# Patient Record
Sex: Female | Born: 1955 | Race: White | Hispanic: No | Smoking: Former smoker
Health system: Southern US, Community
[De-identification: ages and names within clinical notes are randomized; demographics above are authoritative.]

## PROBLEM LIST (undated history)

## (undated) DIAGNOSIS — I10 Essential (primary) hypertension: Secondary | ICD-10-CM

## (undated) DIAGNOSIS — R030 Elevated blood-pressure reading, without diagnosis of hypertension: Secondary | ICD-10-CM

## (undated) DIAGNOSIS — Z5111 Encounter for antineoplastic chemotherapy: Secondary | ICD-10-CM

## (undated) DIAGNOSIS — IMO0001 Reserved for inherently not codable concepts without codable children: Secondary | ICD-10-CM

## (undated) DIAGNOSIS — J189 Pneumonia, unspecified organism: Secondary | ICD-10-CM

## (undated) DIAGNOSIS — Z853 Personal history of malignant neoplasm of breast: Secondary | ICD-10-CM

## (undated) DIAGNOSIS — T451X5A Adverse effect of antineoplastic and immunosuppressive drugs, initial encounter: Principal | ICD-10-CM

## (undated) DIAGNOSIS — J449 Chronic obstructive pulmonary disease, unspecified: Secondary | ICD-10-CM

## (undated) DIAGNOSIS — C3490 Malignant neoplasm of unspecified part of unspecified bronchus or lung: Secondary | ICD-10-CM

## (undated) DIAGNOSIS — R918 Other nonspecific abnormal finding of lung field: Secondary | ICD-10-CM

## (undated) DIAGNOSIS — C50911 Malignant neoplasm of unspecified site of right female breast: Secondary | ICD-10-CM

## (undated) DIAGNOSIS — R7303 Prediabetes: Secondary | ICD-10-CM

## (undated) DIAGNOSIS — Z923 Personal history of irradiation: Secondary | ICD-10-CM

## (undated) DIAGNOSIS — D701 Agranulocytosis secondary to cancer chemotherapy: Principal | ICD-10-CM

## (undated) DIAGNOSIS — R490 Dysphonia: Secondary | ICD-10-CM

## (undated) DIAGNOSIS — Z780 Asymptomatic menopausal state: Secondary | ICD-10-CM

## (undated) DIAGNOSIS — E559 Vitamin D deficiency, unspecified: Secondary | ICD-10-CM

## (undated) DIAGNOSIS — E876 Hypokalemia: Secondary | ICD-10-CM

## (undated) HISTORY — DX: Malignant neoplasm of unspecified part of unspecified bronchus or lung: C34.90

## (undated) HISTORY — DX: Chronic obstructive pulmonary disease, unspecified: J44.9

## (undated) HISTORY — DX: Elevated blood-pressure reading, without diagnosis of hypertension: R03.0

## (undated) HISTORY — DX: Personal history of irradiation: Z92.3

## (undated) HISTORY — DX: Encounter for antineoplastic chemotherapy: Z51.11

## (undated) HISTORY — DX: Reserved for inherently not codable concepts without codable children: IMO0001

## (undated) HISTORY — DX: Personal history of malignant neoplasm of breast: Z85.3

## (undated) HISTORY — DX: Hypokalemia: E87.6

## (undated) HISTORY — DX: Asymptomatic menopausal state: Z78.0

## (undated) HISTORY — DX: Agranulocytosis secondary to cancer chemotherapy: D70.1

## (undated) HISTORY — DX: Adverse effect of antineoplastic and immunosuppressive drugs, initial encounter: T45.1X5A

---

## 1998-04-26 HISTORY — PX: VAGINAL HYSTERECTOMY: SUR661

## 1998-04-26 HISTORY — PX: UTERINE FIBROID SURGERY: SHX826

## 1998-04-26 HISTORY — PX: VESICOVAGINAL FISTULA CLOSURE W/ TAH: SUR271

## 2007-04-27 DIAGNOSIS — C50911 Malignant neoplasm of unspecified site of right female breast: Secondary | ICD-10-CM

## 2007-04-27 HISTORY — PX: BREAST LUMPECTOMY: SHX2

## 2007-04-27 HISTORY — DX: Malignant neoplasm of unspecified site of right female breast: C50.911

## 2007-04-27 HISTORY — PX: BREAST BIOPSY: SHX20

## 2007-08-07 ENCOUNTER — Encounter (INDEPENDENT_AMBULATORY_CARE_PROVIDER_SITE_OTHER): Payer: Self-pay | Admitting: Family Medicine

## 2007-08-07 ENCOUNTER — Ambulatory Visit: Payer: Self-pay | Admitting: Family Medicine

## 2007-08-07 LAB — CONVERTED CEMR LAB
Albumin: 4.3 g/dL (ref 3.5–5.2)
CO2: 25 meq/L (ref 19–32)
Calcium: 9.7 mg/dL (ref 8.4–10.5)
Chloride: 104 meq/L (ref 96–112)
Cholesterol: 223 mg/dL — ABNORMAL HIGH (ref 0–200)
Eosinophils Relative: 3 % (ref 0–5)
Glucose, Bld: 79 mg/dL (ref 70–99)
HCT: 42.3 % (ref 36.0–46.0)
Hemoglobin: 14.6 g/dL (ref 12.0–15.0)
Lymphocytes Relative: 25 % (ref 12–46)
Lymphs Abs: 2.7 10*3/uL (ref 0.7–4.0)
Neutro Abs: 7 10*3/uL (ref 1.7–7.7)
Platelets: 336 10*3/uL (ref 150–400)
Sodium: 141 meq/L (ref 135–145)
Total Bilirubin: 0.3 mg/dL (ref 0.3–1.2)
Total Protein: 7.2 g/dL (ref 6.0–8.3)
Triglycerides: 175 mg/dL — ABNORMAL HIGH (ref <150)
VLDL: 35 mg/dL (ref 0–40)
WBC: 11.1 10*3/uL — ABNORMAL HIGH (ref 4.0–10.5)

## 2007-08-16 ENCOUNTER — Ambulatory Visit (HOSPITAL_COMMUNITY): Admission: RE | Admit: 2007-08-16 | Discharge: 2007-08-16 | Payer: Self-pay | Admitting: Family Medicine

## 2007-08-28 ENCOUNTER — Encounter: Admission: RE | Admit: 2007-08-28 | Discharge: 2007-08-28 | Payer: Self-pay | Admitting: Family Medicine

## 2007-09-20 ENCOUNTER — Encounter: Admission: RE | Admit: 2007-09-20 | Discharge: 2007-09-20 | Payer: Self-pay | Admitting: Internal Medicine

## 2007-09-20 ENCOUNTER — Encounter (INDEPENDENT_AMBULATORY_CARE_PROVIDER_SITE_OTHER): Payer: Self-pay | Admitting: Diagnostic Radiology

## 2007-09-29 ENCOUNTER — Encounter: Admission: RE | Admit: 2007-09-29 | Discharge: 2007-09-29 | Payer: Self-pay | Admitting: Internal Medicine

## 2007-10-10 ENCOUNTER — Ambulatory Visit (HOSPITAL_COMMUNITY): Admission: RE | Admit: 2007-10-10 | Discharge: 2007-10-10 | Payer: Self-pay | Admitting: General Surgery

## 2007-10-10 ENCOUNTER — Encounter (INDEPENDENT_AMBULATORY_CARE_PROVIDER_SITE_OTHER): Payer: Self-pay | Admitting: General Surgery

## 2007-10-23 ENCOUNTER — Ambulatory Visit: Payer: Self-pay | Admitting: Oncology

## 2007-11-08 LAB — CBC WITH DIFFERENTIAL/PLATELET
BASO%: 0.3 % (ref 0.0–2.0)
EOS%: 3 % (ref 0.0–7.0)
HCT: 41.2 % (ref 34.8–46.6)
LYMPH%: 21 % (ref 14.0–48.0)
MCH: 31.9 pg (ref 26.0–34.0)
MCHC: 34.9 g/dL (ref 32.0–36.0)
MCV: 91.3 fL (ref 81.0–101.0)
MONO%: 8 % (ref 0.0–13.0)
NEUT%: 67.7 % (ref 39.6–76.8)
Platelets: 337 10*3/uL (ref 145–400)
lymph#: 2.1 10*3/uL (ref 0.9–3.3)

## 2007-11-09 LAB — COMPREHENSIVE METABOLIC PANEL
ALT: 21 U/L (ref 0–35)
AST: 14 U/L (ref 0–37)
Alkaline Phosphatase: 82 U/L (ref 39–117)
BUN: 10 mg/dL (ref 6–23)
Creatinine, Ser: 0.7 mg/dL (ref 0.40–1.20)
Total Bilirubin: 0.4 mg/dL (ref 0.3–1.2)

## 2007-11-10 ENCOUNTER — Ambulatory Visit: Payer: Self-pay | Admitting: Internal Medicine

## 2007-11-13 ENCOUNTER — Encounter: Admission: RE | Admit: 2007-11-13 | Discharge: 2007-11-13 | Payer: Self-pay | Admitting: Oncology

## 2007-11-14 ENCOUNTER — Ambulatory Visit (HOSPITAL_COMMUNITY): Admission: RE | Admit: 2007-11-14 | Discharge: 2007-11-14 | Payer: Self-pay | Admitting: Oncology

## 2007-11-18 ENCOUNTER — Ambulatory Visit (HOSPITAL_COMMUNITY): Admission: RE | Admit: 2007-11-18 | Discharge: 2007-11-18 | Payer: Self-pay | Admitting: Oncology

## 2007-12-08 ENCOUNTER — Ambulatory Visit: Payer: Self-pay | Admitting: Oncology

## 2007-12-18 LAB — CBC WITH DIFFERENTIAL/PLATELET
BASO%: 1.8 % (ref 0.0–2.0)
Basophils Absolute: 0.4 10*3/uL — ABNORMAL HIGH (ref 0.0–0.1)
HCT: 42.2 % (ref 34.8–46.6)
LYMPH%: 19.5 % (ref 14.0–48.0)
MCHC: 33.6 g/dL (ref 32.0–36.0)
MONO#: 0.7 10*3/uL (ref 0.1–0.9)
NEUT%: 74.6 % (ref 39.6–76.8)
Platelets: 302 10*3/uL (ref 145–400)
WBC: 19.7 10*3/uL — ABNORMAL HIGH (ref 3.9–10.0)

## 2008-01-02 LAB — COMPREHENSIVE METABOLIC PANEL
ALT: 26 U/L (ref 0–35)
AST: 15 U/L (ref 0–37)
BUN: 16 mg/dL (ref 6–23)
CO2: 23 mEq/L (ref 19–32)
Calcium: 10.1 mg/dL (ref 8.4–10.5)
Creatinine, Ser: 0.64 mg/dL (ref 0.40–1.20)
Total Bilirubin: 0.3 mg/dL (ref 0.3–1.2)

## 2008-01-02 LAB — CBC WITH DIFFERENTIAL/PLATELET
BASO%: 0.2 % (ref 0.0–2.0)
Basophils Absolute: 0 10*3/uL (ref 0.0–0.1)
HCT: 36.6 % (ref 34.8–46.6)
HGB: 12.9 g/dL (ref 11.6–15.9)
LYMPH%: 6.4 % — ABNORMAL LOW (ref 14.0–48.0)
MCHC: 35.1 g/dL (ref 32.0–36.0)
MONO#: 0.5 10*3/uL (ref 0.1–0.9)
NEUT%: 90.7 % — ABNORMAL HIGH (ref 39.6–76.8)
Platelets: 479 10*3/uL — ABNORMAL HIGH (ref 145–400)
WBC: 19.8 10*3/uL — ABNORMAL HIGH (ref 3.9–10.0)
lymph#: 1.3 10*3/uL (ref 0.9–3.3)

## 2008-01-09 LAB — CBC WITH DIFFERENTIAL/PLATELET
BASO%: 0.4 % (ref 0.0–2.0)
Basophils Absolute: 0.1 10*3/uL (ref 0.0–0.1)
EOS%: 0.8 % (ref 0.0–7.0)
HCT: 37 % (ref 34.8–46.6)
HGB: 12.9 g/dL (ref 11.6–15.9)
LYMPH%: 16.5 % (ref 14.0–48.0)
MCH: 32.9 pg (ref 26.0–34.0)
MCHC: 34.8 g/dL (ref 32.0–36.0)
MCV: 94.6 fL (ref 81.0–101.0)
MONO%: 4.8 % (ref 0.0–13.0)
NEUT%: 77.5 % — ABNORMAL HIGH (ref 39.6–76.8)
Platelets: 361 10*3/uL (ref 145–400)

## 2008-01-19 ENCOUNTER — Ambulatory Visit: Payer: Self-pay | Admitting: Oncology

## 2008-01-22 LAB — CBC WITH DIFFERENTIAL/PLATELET
Eosinophils Absolute: 0 10*3/uL (ref 0.0–0.5)
HCT: 37.4 % (ref 34.8–46.6)
LYMPH%: 6.4 % — ABNORMAL LOW (ref 14.0–48.0)
MONO#: 0.8 10*3/uL (ref 0.1–0.9)
NEUT#: 23.3 10*3/uL — ABNORMAL HIGH (ref 1.5–6.5)
NEUT%: 90.5 % — ABNORMAL HIGH (ref 39.6–76.8)
Platelets: 487 10*3/uL — ABNORMAL HIGH (ref 145–400)
WBC: 25.7 10*3/uL — ABNORMAL HIGH (ref 3.9–10.0)

## 2008-01-22 LAB — COMPREHENSIVE METABOLIC PANEL
BUN: 11 mg/dL (ref 6–23)
CO2: 23 mEq/L (ref 19–32)
Creatinine, Ser: 0.65 mg/dL (ref 0.40–1.20)
Glucose, Bld: 149 mg/dL — ABNORMAL HIGH (ref 70–99)
Total Bilirubin: 0.3 mg/dL (ref 0.3–1.2)

## 2008-01-30 LAB — CBC WITH DIFFERENTIAL/PLATELET
Basophils Absolute: 0 10*3/uL (ref 0.0–0.1)
EOS%: 0.2 % (ref 0.0–7.0)
Eosinophils Absolute: 0.1 10*3/uL (ref 0.0–0.5)
HGB: 12.5 g/dL (ref 11.6–15.9)
MCH: 32.4 pg (ref 26.0–34.0)
MCV: 96.5 fL (ref 81.0–101.0)
MONO%: 4.9 % (ref 0.0–13.0)
NEUT#: 32.4 10*3/uL — ABNORMAL HIGH (ref 1.5–6.5)
RBC: 3.86 10*6/uL (ref 3.70–5.32)
RDW: 15.9 % — ABNORMAL HIGH (ref 11.3–14.5)
lymph#: 3.2 10*3/uL (ref 0.9–3.3)

## 2008-02-12 LAB — CBC WITH DIFFERENTIAL/PLATELET
Basophils Absolute: 0.1 10*3/uL (ref 0.0–0.1)
Eosinophils Absolute: 0 10*3/uL (ref 0.0–0.5)
HCT: 34.3 % — ABNORMAL LOW (ref 34.8–46.6)
HGB: 12 g/dL (ref 11.6–15.9)
LYMPH%: 3.5 % — ABNORMAL LOW (ref 14.0–48.0)
MONO#: 1.1 10*3/uL — ABNORMAL HIGH (ref 0.1–0.9)
NEUT#: 21.2 10*3/uL — ABNORMAL HIGH (ref 1.5–6.5)
NEUT%: 91.5 % — ABNORMAL HIGH (ref 39.6–76.8)
Platelets: 492 10*3/uL — ABNORMAL HIGH (ref 145–400)
WBC: 23.2 10*3/uL — ABNORMAL HIGH (ref 3.9–10.0)
lymph#: 0.8 10*3/uL — ABNORMAL LOW (ref 0.9–3.3)

## 2008-02-20 ENCOUNTER — Ambulatory Visit: Admission: RE | Admit: 2008-02-20 | Discharge: 2008-05-19 | Payer: Self-pay | Admitting: Radiation Oncology

## 2008-02-21 LAB — CBC WITH DIFFERENTIAL/PLATELET
BASO%: 2.1 % — ABNORMAL HIGH (ref 0.0–2.0)
Basophils Absolute: 0.6 10*3/uL — ABNORMAL HIGH (ref 0.0–0.1)
EOS%: 0.3 % (ref 0.0–7.0)
HCT: 36.4 % (ref 34.8–46.6)
HGB: 12.4 g/dL (ref 11.6–15.9)
LYMPH%: 10.5 % — ABNORMAL LOW (ref 14.0–48.0)
MCH: 32.8 pg (ref 26.0–34.0)
MCHC: 34 g/dL (ref 32.0–36.0)
NEUT%: 83.6 % — ABNORMAL HIGH (ref 39.6–76.8)
Platelets: 487 10*3/uL — ABNORMAL HIGH (ref 145–400)

## 2008-03-19 ENCOUNTER — Ambulatory Visit: Payer: Self-pay | Admitting: Oncology

## 2008-04-22 LAB — COMPREHENSIVE METABOLIC PANEL
AST: 14 U/L (ref 0–37)
Albumin: 4.1 g/dL (ref 3.5–5.2)
Alkaline Phosphatase: 72 U/L (ref 39–117)
BUN: 13 mg/dL (ref 6–23)
Creatinine, Ser: 0.71 mg/dL (ref 0.40–1.20)
Glucose, Bld: 129 mg/dL — ABNORMAL HIGH (ref 70–99)

## 2008-04-22 LAB — CBC WITH DIFFERENTIAL/PLATELET
Basophils Absolute: 0 10*3/uL (ref 0.0–0.1)
EOS%: 1.8 % (ref 0.0–7.0)
Eosinophils Absolute: 0.2 10*3/uL (ref 0.0–0.5)
HCT: 42 % (ref 34.8–46.6)
HGB: 14.5 g/dL (ref 11.6–15.9)
LYMPH%: 15.8 % (ref 14.0–48.0)
MCH: 32.7 pg (ref 26.0–34.0)
MCV: 94.5 fL (ref 81.0–101.0)
MONO%: 5 % (ref 0.0–13.0)
NEUT#: 6.5 10*3/uL (ref 1.5–6.5)
NEUT%: 77.3 % — ABNORMAL HIGH (ref 39.6–76.8)
Platelets: 323 10*3/uL (ref 145–400)

## 2008-04-22 LAB — LUTEINIZING HORMONE: LH: 32.2 m[IU]/mL

## 2008-05-07 LAB — ESTRADIOL, ULTRA SENS

## 2008-07-30 ENCOUNTER — Ambulatory Visit: Payer: Self-pay | Admitting: Genetic Counselor

## 2008-08-09 ENCOUNTER — Ambulatory Visit: Payer: Self-pay | Admitting: Oncology

## 2008-08-09 LAB — CBC WITH DIFFERENTIAL/PLATELET
BASO%: 0.5 % (ref 0.0–2.0)
EOS%: 2.6 % (ref 0.0–7.0)
HCT: 37.9 % (ref 34.8–46.6)
LYMPH%: 23.9 % (ref 14.0–49.7)
MCH: 32.4 pg (ref 25.1–34.0)
MCHC: 34.9 g/dL (ref 31.5–36.0)
NEUT%: 65.2 % (ref 38.4–76.8)
Platelets: 321 10*3/uL (ref 145–400)
RBC: 4.09 10*6/uL (ref 3.70–5.45)

## 2008-08-12 LAB — VITAMIN D 25 HYDROXY (VIT D DEFICIENCY, FRACTURES): Vit D, 25-Hydroxy: 20 ng/mL — ABNORMAL LOW (ref 30–89)

## 2008-08-12 LAB — COMPREHENSIVE METABOLIC PANEL
Albumin: 3.8 g/dL (ref 3.5–5.2)
Alkaline Phosphatase: 77 U/L (ref 39–117)
BUN: 13 mg/dL (ref 6–23)
Calcium: 9.5 mg/dL (ref 8.4–10.5)
Creatinine, Ser: 0.67 mg/dL (ref 0.40–1.20)
Glucose, Bld: 98 mg/dL (ref 70–99)
Potassium: 4.1 mEq/L (ref 3.5–5.3)

## 2008-08-12 LAB — FOLLICLE STIMULATING HORMONE: FSH: 63.5 m[IU]/mL

## 2008-08-18 LAB — ESTRADIOL, ULTRA SENS

## 2008-09-02 ENCOUNTER — Ambulatory Visit: Payer: Self-pay | Admitting: Family Medicine

## 2008-09-13 ENCOUNTER — Ambulatory Visit: Payer: Self-pay | Admitting: *Deleted

## 2008-09-13 ENCOUNTER — Ambulatory Visit: Payer: Self-pay | Admitting: Internal Medicine

## 2009-01-14 ENCOUNTER — Encounter (INDEPENDENT_AMBULATORY_CARE_PROVIDER_SITE_OTHER): Payer: Self-pay | Admitting: Adult Health

## 2009-01-14 ENCOUNTER — Ambulatory Visit: Payer: Self-pay | Admitting: Internal Medicine

## 2009-01-14 LAB — CONVERTED CEMR LAB
Albumin: 4.4 g/dL (ref 3.5–5.2)
Alkaline Phosphatase: 83 units/L (ref 39–117)
BUN: 12 mg/dL (ref 6–23)
Calcium: 9.1 mg/dL (ref 8.4–10.5)
Chloride: 106 meq/L (ref 96–112)
Eosinophils Relative: 3 % (ref 0–5)
Glucose, Bld: 92 mg/dL (ref 70–99)
HCT: 42.1 % (ref 36.0–46.0)
HDL: 51 mg/dL (ref >39)
Hemoglobin: 14.7 g/dL (ref 12.0–15.0)
LDL Cholesterol: 146 mg/dL — ABNORMAL HIGH (ref 0–99)
Lymphocytes Relative: 22 % (ref 12–46)
Lymphs Abs: 2 10*3/uL (ref 0.7–4.0)
Monocytes Absolute: 0.8 10*3/uL (ref 0.1–1.0)
Monocytes Relative: 8 % (ref 3–12)
Neutro Abs: 6.3 10*3/uL (ref 1.7–7.7)
Potassium: 4.2 meq/L (ref 3.5–5.3)
RBC: 4.5 M/uL (ref 3.87–5.11)
Sodium: 139 meq/L (ref 135–145)
Total Protein: 6.9 g/dL (ref 6.0–8.3)
Triglycerides: 201 mg/dL — ABNORMAL HIGH (ref <150)
Vit D, 25-Hydroxy: 24 ng/mL — ABNORMAL LOW (ref 30–89)
WBC: 9.3 10*3/uL (ref 4.0–10.5)

## 2009-01-20 ENCOUNTER — Encounter: Admission: RE | Admit: 2009-01-20 | Discharge: 2009-01-20 | Payer: Self-pay | Admitting: Internal Medicine

## 2009-06-06 ENCOUNTER — Ambulatory Visit: Payer: Self-pay | Admitting: Internal Medicine

## 2009-07-30 ENCOUNTER — Ambulatory Visit (HOSPITAL_COMMUNITY): Admission: RE | Admit: 2009-07-30 | Discharge: 2009-07-30 | Payer: Self-pay | Admitting: Gastroenterology

## 2009-10-01 ENCOUNTER — Ambulatory Visit: Payer: Self-pay | Admitting: Internal Medicine

## 2009-10-17 ENCOUNTER — Ambulatory Visit: Payer: Self-pay | Admitting: Internal Medicine

## 2009-10-22 ENCOUNTER — Ambulatory Visit: Payer: Self-pay | Admitting: Oncology

## 2009-12-16 ENCOUNTER — Ambulatory Visit: Payer: Self-pay | Admitting: Oncology

## 2009-12-18 LAB — COMPREHENSIVE METABOLIC PANEL
ALT: 30 U/L (ref 0–35)
Albumin: 4.5 g/dL (ref 3.5–5.2)
CO2: 21 mEq/L (ref 19–32)
Potassium: 4.1 mEq/L (ref 3.5–5.3)
Sodium: 138 mEq/L (ref 135–145)
Total Bilirubin: 0.4 mg/dL (ref 0.3–1.2)
Total Protein: 7 g/dL (ref 6.0–8.3)

## 2009-12-18 LAB — CBC WITH DIFFERENTIAL/PLATELET
BASO%: 0.5 % (ref 0.0–2.0)
LYMPH%: 28.1 % (ref 14.0–49.7)
MCHC: 34.2 g/dL (ref 31.5–36.0)
MONO#: 0.4 10*3/uL (ref 0.1–0.9)
NEUT#: 4.9 10*3/uL (ref 1.5–6.5)
Platelets: 344 10*3/uL (ref 145–400)
RBC: 4.7 10*6/uL (ref 3.70–5.45)
RDW: 13.7 % (ref 11.2–14.5)
WBC: 7.8 10*3/uL (ref 3.9–10.3)
lymph#: 2.2 10*3/uL (ref 0.9–3.3)

## 2009-12-18 LAB — LACTATE DEHYDROGENASE: LDH: 161 U/L (ref 94–250)

## 2009-12-18 LAB — VITAMIN D 25 HYDROXY (VIT D DEFICIENCY, FRACTURES): Vit D, 25-Hydroxy: 31 ng/mL (ref 30–89)

## 2009-12-22 ENCOUNTER — Ambulatory Visit: Payer: Self-pay | Admitting: Internal Medicine

## 2010-01-01 ENCOUNTER — Encounter: Admission: RE | Admit: 2010-01-01 | Discharge: 2010-01-01 | Payer: Self-pay | Admitting: Oncology

## 2010-06-04 ENCOUNTER — Other Ambulatory Visit: Payer: Self-pay | Admitting: Oncology

## 2010-06-04 ENCOUNTER — Encounter (HOSPITAL_BASED_OUTPATIENT_CLINIC_OR_DEPARTMENT_OTHER): Payer: Self-pay | Admitting: Oncology

## 2010-06-04 DIAGNOSIS — C50419 Malignant neoplasm of upper-outer quadrant of unspecified female breast: Secondary | ICD-10-CM

## 2010-06-04 DIAGNOSIS — Z170 Estrogen receptor positive status [ER+]: Secondary | ICD-10-CM

## 2010-06-04 LAB — CBC WITH DIFFERENTIAL/PLATELET
Basophils Absolute: 0 10*3/uL (ref 0.0–0.1)
HCT: 44.3 % (ref 34.8–46.6)
HGB: 14.9 g/dL (ref 11.6–15.9)
LYMPH%: 18.7 % (ref 14.0–49.7)
MCHC: 33.7 g/dL (ref 31.5–36.0)
MONO#: 0.5 10*3/uL (ref 0.1–0.9)
NEUT%: 74 % (ref 38.4–76.8)
Platelets: 304 10*3/uL (ref 145–400)
WBC: 10.5 10*3/uL — ABNORMAL HIGH (ref 3.9–10.3)
lymph#: 2 10*3/uL (ref 0.9–3.3)

## 2010-06-05 LAB — COMPREHENSIVE METABOLIC PANEL
BUN: 10 mg/dL (ref 6–23)
CO2: 25 mEq/L (ref 19–32)
Calcium: 9.2 mg/dL (ref 8.4–10.5)
Chloride: 102 mEq/L (ref 96–112)
Creatinine, Ser: 0.69 mg/dL (ref 0.40–1.20)
Glucose, Bld: 84 mg/dL (ref 70–99)
Total Bilirubin: 0.4 mg/dL (ref 0.3–1.2)

## 2010-06-05 LAB — VITAMIN D 25 HYDROXY (VIT D DEFICIENCY, FRACTURES): Vit D, 25-Hydroxy: 27 ng/mL — ABNORMAL LOW (ref 30–89)

## 2010-06-05 LAB — CANCER ANTIGEN 27.29: CA 27.29: 10 U/mL (ref 0–39)

## 2010-06-05 LAB — LACTATE DEHYDROGENASE: LDH: 147 U/L (ref 94–250)

## 2010-06-09 ENCOUNTER — Encounter: Payer: Self-pay | Admitting: Oncology

## 2010-06-09 ENCOUNTER — Other Ambulatory Visit: Payer: Self-pay | Admitting: Oncology

## 2010-06-09 DIAGNOSIS — Z853 Personal history of malignant neoplasm of breast: Secondary | ICD-10-CM

## 2010-09-08 NOTE — Op Note (Signed)
NAME:  Sharon [name], Sharon [name]                  ACCOUNT NO.:  [id number]   MEDICAL RECORD NO.:  [id number]          PATIENT TYPE:  AMB   LOCATION:  SDS                          FACILITY:  [facility]   PHYSICIAN:  Ollen Gross. Vernell Morgans, M.D. DATE OF BIRTH:  11-20-1955   DATE OF PROCEDURE:  10/10/2007  DATE OF DISCHARGE:  10/10/2007                               OPERATIVE REPORT   PREOPERATIVE DIAGNOSIS:  Right breast cancer.   POSTOPERATIVE DIAGNOSIS:  Right breast cancer.   PROCEDURE:  Right lumpectomy and sentinel node biopsy with injection of  blue dye.   SURGEON:  Ollen Gross. Vernell Morgans, MD   ANESTHESIA:  General via LMA.   PROCEDURE IN DETAIL:  After informed consent was obtained, the patient  was brought to the operating room and placed in supine position on the  operating table.  After adequate induction of general anesthesia, the  patient's right breast, axilla, and chest wall were prepped with  Betadine and draped in usual sterile manner.  The patient's palpable  mass was in the upper outer quadrant near the axilla.  It was palpable  and did have some skin retraction associated with it.  Methylene blue 2  mL and 3 mL of injectable saline were then injected in the subareolar  position.  The breast was massaged for several minutes.  Earlier in the  day, the patient had undergone injection of 1 mCi of technetium sulfur  colloid also in the subareolar position.  A NeoProbe was then used to  identify a hot spot in the right axilla.  This was very near to the mass  itself.  Therefore, an elliptical incision was made around the area of  the cancer where the skin was retracted and this incision was extended  laterally into the axilla overlying the increased radioactivity area.  The lateral portion of the incision was then carried down through the  skin and subcutaneous tissue sharply with electrocautery until we were  into the axilla.  A Weitlaner retractor was deployed.  Blunt dissection  was then carried  out in this axilla until a hot blue lymph node was  identified.  Ex vivo counts on this were about 400.  It was excised by a  combination of sharp Bovie dissection and then the lymphatics were  clamped with hemostats, divided, and ligated with 3-0 Vicryl ties.  This  was sent as sentinel node #1.  Higher in the axilla, there was another  spot of increased radioactivity which was identified with the NeoProbe.  This area was also excised sharply with electrocautery and the  lymphatics were clamped with hemostats, divided, and ligated with 3-0  Vicryl ties.  This was sent as sentinel node #2, it was hot but not  blue.  Attention was then turned to the mass itself.  The mass was  palpable and the mass was widely excised sharply with electrocautery  down to the chest wall.  This was done in a manner to maximize our  margins and try to stay away from the palpable mass.  Once this specimen  was  completely removed, it was oriented with the pink margin kit  according to the key and the key was sent with the specimen to the  pathologist.  Hemostasis was achieved using the Bovie electrocautery.  The wound was irrigated with copious amounts of saline.  The wound was  infiltrated with 0.25% Marcaine.  The deep layer of the wound was then  closed with interrupted 3-0 Vicryl stitches and the skin was closed with  running 4-0 Monocryl subcuticular  stitch.  Dermabond dressing was applied.  The patient tolerated the  procedure well.  At the end the case, all needle, sponge, and instrument  counts were correct.  The patient was then awakened and taken to  recovery in stable condition.      Ollen Gross. Vernell Morgans, M.D.  Electronically Signed     PST/MEDQ  D:  10/10/2007  T:  10/11/2007  Job:  [id number]

## 2011-01-21 LAB — COMPREHENSIVE METABOLIC PANEL
Alkaline Phosphatase: 83
BUN: 11
CO2: 28
Chloride: 103
Creatinine, Ser: 0.67
GFR calc non Af Amer: >60
Total Bilirubin: 0.5

## 2011-01-21 LAB — DIFFERENTIAL
Basophils Absolute: 0.1
Lymphocytes Relative: 22
Lymphs Abs: 2.5
Monocytes Absolute: 0.8
Monocytes Relative: 7
Neutro Abs: 7.4

## 2011-01-21 LAB — CBC
HCT: 39.8
Hemoglobin: 13.7
MCV: 94.1
RBC: 4.24
WBC: 11.1 — ABNORMAL HIGH

## 2011-01-22 ENCOUNTER — Ambulatory Visit
Admission: RE | Admit: 2011-01-22 | Discharge: 2011-01-22 | Disposition: A | Discharge status: Home / Self Care | Payer: No Typology Code available for payment source | Source: Ambulatory Visit | Attending: Oncology | Admitting: Oncology

## 2011-01-22 DIAGNOSIS — Z853 Personal history of malignant neoplasm of breast: Secondary | ICD-10-CM

## 2011-01-24 ENCOUNTER — Inpatient Hospital Stay (INDEPENDENT_AMBULATORY_CARE_PROVIDER_SITE_OTHER)
Admission: RE | Admit: 2011-01-24 | Discharge: 2011-01-24 | Disposition: A | Discharge status: Home / Self Care | Payer: No Typology Code available for payment source | Source: Ambulatory Visit | Attending: Emergency Medicine | Admitting: Emergency Medicine

## 2011-01-24 DIAGNOSIS — J42 Unspecified chronic bronchitis: Secondary | ICD-10-CM

## 2011-02-12 ENCOUNTER — Other Ambulatory Visit: Payer: Self-pay | Admitting: Oncology

## 2011-02-12 ENCOUNTER — Encounter (HOSPITAL_BASED_OUTPATIENT_CLINIC_OR_DEPARTMENT_OTHER): Payer: No Typology Code available for payment source | Admitting: Oncology

## 2011-02-12 DIAGNOSIS — C50419 Malignant neoplasm of upper-outer quadrant of unspecified female breast: Secondary | ICD-10-CM

## 2011-02-12 DIAGNOSIS — Z170 Estrogen receptor positive status [ER+]: Secondary | ICD-10-CM

## 2011-02-12 LAB — VITAMIN D 25 HYDROXY (VIT D DEFICIENCY, FRACTURES): Vit D, 25-Hydroxy: 28 ng/mL — ABNORMAL LOW (ref 30–89)

## 2011-02-12 LAB — COMPREHENSIVE METABOLIC PANEL
AST: 23 U/L (ref 0–37)
BUN: 14 mg/dL (ref 6–23)
CO2: 23 mEq/L (ref 19–32)
Calcium: 9.8 mg/dL (ref 8.4–10.5)
Chloride: 104 mEq/L (ref 96–112)
Creatinine, Ser: 0.69 mg/dL (ref 0.50–1.10)
Total Bilirubin: 0.4 mg/dL (ref 0.3–1.2)

## 2011-02-12 LAB — CBC WITH DIFFERENTIAL/PLATELET
Basophils Absolute: 0.1 10*3/uL (ref 0.0–0.1)
EOS%: 3.8 % (ref 0.0–7.0)
HCT: 42 % (ref 34.8–46.6)
HGB: 14.4 g/dL (ref 11.6–15.9)
LYMPH%: 21.2 % (ref 14.0–49.7)
MCH: 32.5 pg (ref 25.1–34.0)
NEUT%: 66.3 % (ref 38.4–76.8)
Platelets: 318 10*3/uL (ref 145–400)
lymph#: 2.2 10*3/uL (ref 0.9–3.3)

## 2011-02-12 LAB — LACTATE DEHYDROGENASE: LDH: 157 U/L (ref 94–250)

## 2011-03-31 ENCOUNTER — Telehealth: Payer: Self-pay | Admitting: *Deleted

## 2011-03-31 NOTE — Telephone Encounter (Signed)
patient confirmed over the phone the new date and time of the new appointment in 07-2011

## 2011-05-04 ENCOUNTER — Other Ambulatory Visit (HOSPITAL_COMMUNITY): Payer: Self-pay | Admitting: Family Medicine

## 2011-05-04 DIAGNOSIS — J449 Chronic obstructive pulmonary disease, unspecified: Secondary | ICD-10-CM

## 2011-05-11 ENCOUNTER — Encounter (HOSPITAL_COMMUNITY): Payer: No Typology Code available for payment source

## 2011-05-18 ENCOUNTER — Ambulatory Visit (HOSPITAL_COMMUNITY)
Admission: RE | Admit: 2011-05-18 | Discharge: 2011-05-18 | Disposition: A | Discharge status: Home / Self Care | Payer: Self-pay | Source: Ambulatory Visit | Attending: Family Medicine | Admitting: Family Medicine

## 2011-05-18 DIAGNOSIS — J4489 Other specified chronic obstructive pulmonary disease: Secondary | ICD-10-CM | POA: Insufficient documentation

## 2011-05-18 DIAGNOSIS — J449 Chronic obstructive pulmonary disease, unspecified: Secondary | ICD-10-CM | POA: Insufficient documentation

## 2011-06-28 ENCOUNTER — Other Ambulatory Visit: Payer: Self-pay | Admitting: Family Medicine

## 2011-07-26 ENCOUNTER — Other Ambulatory Visit: Payer: Self-pay | Admitting: *Deleted

## 2011-07-26 DIAGNOSIS — C50919 Malignant neoplasm of unspecified site of unspecified female breast: Secondary | ICD-10-CM

## 2011-07-26 MED ORDER — LETROZOLE 2.5 MG PO TABS: 2.5000 mg | ORAL_TABLET | Freq: Every day | ORAL | Status: DC

## 2011-07-27 ENCOUNTER — Other Ambulatory Visit: Payer: No Typology Code available for payment source | Admitting: Lab

## 2011-08-02 ENCOUNTER — Other Ambulatory Visit: Payer: Self-pay | Admitting: *Deleted

## 2011-08-02 ENCOUNTER — Ambulatory Visit (HOSPITAL_BASED_OUTPATIENT_CLINIC_OR_DEPARTMENT_OTHER): Payer: Self-pay | Admitting: Lab

## 2011-08-02 DIAGNOSIS — Z170 Estrogen receptor positive status [ER+]: Secondary | ICD-10-CM

## 2011-08-02 DIAGNOSIS — C50919 Malignant neoplasm of unspecified site of unspecified female breast: Secondary | ICD-10-CM

## 2011-08-02 DIAGNOSIS — C50419 Malignant neoplasm of upper-outer quadrant of unspecified female breast: Secondary | ICD-10-CM

## 2011-08-02 LAB — COMPREHENSIVE METABOLIC PANEL
ALT: 26 U/L (ref 0–35)
AST: 20 U/L (ref 0–37)
Albumin: 3.8 g/dL (ref 3.5–5.2)
Calcium: 10 mg/dL (ref 8.4–10.5)
Chloride: 99 mEq/L (ref 96–112)
Potassium: 3.8 mEq/L (ref 3.5–5.3)
Sodium: 138 mEq/L (ref 135–145)

## 2011-08-02 LAB — CBC WITH DIFFERENTIAL/PLATELET
BASO%: 1.1 % (ref 0.0–2.0)
MCHC: 33.4 g/dL (ref 31.5–36.0)
MONO#: 0.8 10*3/uL (ref 0.1–0.9)
RBC: 4.38 10*6/uL (ref 3.70–5.45)
WBC: 11.2 10*3/uL — ABNORMAL HIGH (ref 3.9–10.3)
lymph#: 2.2 10*3/uL (ref 0.9–3.3)
nRBC: 0 % (ref 0–0)

## 2011-08-03 ENCOUNTER — Other Ambulatory Visit: Payer: No Typology Code available for payment source | Admitting: Lab

## 2011-08-03 ENCOUNTER — Ambulatory Visit: Payer: No Typology Code available for payment source | Admitting: Oncology

## 2011-08-05 ENCOUNTER — Ambulatory Visit (HOSPITAL_BASED_OUTPATIENT_CLINIC_OR_DEPARTMENT_OTHER): Payer: Self-pay | Admitting: Oncology

## 2011-08-05 VITALS — BP 139/87 | HR 79 | Temp 98.6°F | Ht 65.5 in | Wt 283.3 lb

## 2011-08-05 DIAGNOSIS — E559 Vitamin D deficiency, unspecified: Secondary | ICD-10-CM

## 2011-08-05 DIAGNOSIS — M109 Gout, unspecified: Secondary | ICD-10-CM

## 2011-08-05 DIAGNOSIS — C50919 Malignant neoplasm of unspecified site of unspecified female breast: Secondary | ICD-10-CM

## 2011-08-05 NOTE — Progress Notes (Signed)
Hematology and Oncology Follow Up Visit  Sharon [name] [id number] [birth year] 56 y.o. 08/05/2011 11:08 AM Dr Meriel Flavors , [facility]  DIAGNOSIS: T1CN0 er/pr+ , breast cancer s/p lumpectomy 10/10/07, xrt on femara    PAST THERAPY: as above   Interim History:  Doing well, no concewrns, startin to work at [employer], he daughter is expecting.Mammogram and bone density 9/12- wnl  Medications: I have reviewed the patient's current medications. She is a chronic user of marijuana  Allergies: No Known Allergies  Past Medical History, Surgical history, Social history, and Family History were reviewed and updated.  Review of Systems: Constitutional:  Negative for fever, chills, night sweats, anorexia, weight loss, pain. Cardiovascular: no chest pain or dyspnea on exertion Respiratory: positive for - shortness of breath Neurological: negative Dermatological: negative ENT: negative Skin Gastrointestinal: negative Genito-Urinary: negative Hematological and Lymphatic: negative Breast: negative Musculoskeletal: negative Remaining ROS negative.  Physical Exam:  Blood pressure 139/87, pulse 79, temperature 98.6 F (37 C), temperature source Oral, height 5' 5.5" (1.664 m), weight 283 lb 4.8 oz (128.504 kg).  ECOG:    General appearance: alert, cooperative and appears stated age HEEnt: no abnormailities Chest: clear Cvs: nl heart sounds Abdomen ; nl Breasts- rt breast s/p lumpectomy, xrt, NED Abdomen-nl Ext-nl  Lab Results: Lab Results  Component Value Date   WBC 11.2* 08/02/2011   HGB 14.0 08/02/2011   HCT 42.0 08/02/2011   MCV 95.9 08/02/2011   PLT 321 08/02/2011     Chemistry      Component Value Date/Time   NA 138 08/02/2011 1556   K 3.8 08/02/2011 1556   CL 99 08/02/2011 1556   CO2 27 08/02/2011 1556   BUN 9 08/02/2011 1556   CREATININE 0.66 08/02/2011 1556      Component Value Date/Time   CALCIUM 10.0 08/02/2011 1556   ALKPHOS 92 08/02/2011 1556   AST 20 08/02/2011 1556   ALT 26 08/02/2011  1556   BILITOT 0.2* 08/02/2011 1556       Radiological Studies:  No results found.   IMPRESSIONS AND PLAN: A 56 y.o. female with    [name] is doing well, I will see he rin 6 months with f/u mammogram  Spent more than half the time coordinating care.    [name] 4/11/201311:08 AM

## 2011-08-13 ENCOUNTER — Ambulatory Visit: Payer: No Typology Code available for payment source | Admitting: Oncology

## 2011-08-17 ENCOUNTER — Encounter: Payer: Self-pay | Admitting: Internal Medicine

## 2011-08-18 ENCOUNTER — Telehealth: Payer: Self-pay | Admitting: Internal Medicine

## 2011-08-18 ENCOUNTER — Ambulatory Visit (INDEPENDENT_AMBULATORY_CARE_PROVIDER_SITE_OTHER)
Admission: RE | Admit: 2011-08-18 | Discharge: 2011-08-18 | Disposition: A | Discharge status: Home / Self Care | Payer: Self-pay | Source: Ambulatory Visit | Attending: Internal Medicine | Admitting: Internal Medicine

## 2011-08-18 ENCOUNTER — Ambulatory Visit (INDEPENDENT_AMBULATORY_CARE_PROVIDER_SITE_OTHER): Payer: Self-pay | Admitting: Internal Medicine

## 2011-08-18 ENCOUNTER — Other Ambulatory Visit: Payer: Self-pay | Admitting: *Deleted

## 2011-08-18 ENCOUNTER — Encounter: Payer: Self-pay | Admitting: Internal Medicine

## 2011-08-18 VITALS — BP 112/68 | HR 78 | Temp 98.0°F | Ht 66.5 in | Wt 234.6 lb

## 2011-08-18 DIAGNOSIS — C50919 Malignant neoplasm of unspecified site of unspecified female breast: Secondary | ICD-10-CM

## 2011-08-18 DIAGNOSIS — J4489 Other specified chronic obstructive pulmonary disease: Secondary | ICD-10-CM

## 2011-08-18 DIAGNOSIS — J449 Chronic obstructive pulmonary disease, unspecified: Secondary | ICD-10-CM

## 2011-08-18 DIAGNOSIS — F17200 Nicotine dependence, unspecified, uncomplicated: Secondary | ICD-10-CM | POA: Insufficient documentation

## 2011-08-18 MED ORDER — FLUTICASONE-SALMETEROL 250-50 MCG/DOSE IN AEPB: 1.0000 | INHALATION_SPRAY | Freq: Two times a day (BID) | RESPIRATORY_TRACT | Status: DC

## 2011-08-18 MED ORDER — LETROZOLE 2.5 MG PO TABS: 2.5000 mg | ORAL_TABLET | Freq: Every day | ORAL | Status: AC

## 2011-08-18 NOTE — Telephone Encounter (Signed)
Pt. Called and requested 90 supply of femara because of insurance issues.  Will call in to [facility].

## 2011-08-18 NOTE — Telephone Encounter (Signed)
I spoke with pt and is aware of MW recs. She will try pacing herself.

## 2011-08-18 NOTE — Progress Notes (Signed)
  Subjective:    Patient ID: Sharon [name], female    DOB: 03-08-1956   MRN: [id number]  HPI  7 yowf active smoking first aware of resp problems c/w recurrent bronchitis starting around 2009 and not completely fine in between spells on maint rx with combivent at least twice daily referred by [facility] 08/18/2011 to Pulmonary clinic.   08/18/2011 1st pulmonary eval cc persistent cough x sev months.  Most prod in am and in cold weather, minimally discolored, assoc with mod nasal congestion with some sneezing, and doe x steps x one flight.  Not much variability in doe and not much better now on combivent.  No h/o childhood asthma or allergies or unusual environmental or occupational exp  Sleeping ok without nocturnal exacerbation  of respiratory  c/o's or need for noct saba. Also denies any obvious fluctuation of symptoms with weather or environmental changes or other aggravating or alleviating factors except as outlined above    Review of Systems  Constitutional: Negative for fever, chills and unexpected weight change.  HENT: Positive for congestion and sneezing. Negative for ear pain, nosebleeds, sore throat, rhinorrhea, trouble swallowing, dental problem, voice change, postnasal drip and sinus pressure.   Eyes: Negative for visual disturbance.  Respiratory: Positive for cough and shortness of breath. Negative for choking.   Cardiovascular: Positive for leg swelling. Negative for chest pain.  Gastrointestinal: Negative for vomiting, abdominal pain and diarrhea.  Genitourinary: Negative for difficulty urinating.  Musculoskeletal: Negative for arthralgias.  Skin: Negative for rash.  Neurological: Negative for tremors, syncope and headaches.  Hematological: Does not bruise/bleed easily.       Objective:   Physical Exam Pleasant amb wf nad Wt 234 08/18/11 HEENT mild turbinate edema.  Oropharynx no thrush or excess pnd or cobblestoning.  No JVD or cervical adenopathy. Minaccessory muscle  hypertrophy. Trachea midline, nl thryroid. Chest was  mn hyperinflated by percussion with diminished breath sounds and mild ncreased exp time without wheeze. Hoover sign positive at end inspiration. Regular rate and rhythm without murmur gallop or rub or increase P2 or edema.  Abd: no hsm, nl excursion. Ext warm without cyanosis or clubbing.        CXR  08/18/2011 :  No acute cardiopulmonary abnormality.    Assessment & Plan:

## 2011-08-18 NOTE — Telephone Encounter (Signed)
Pt calling to inform MW about her job which she forgot to mention at OV today.  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 She wants to know if this is okay. Pls advise.

## 2011-08-18 NOTE — Patient Instructions (Addendum)
Advair 250 one twice daily then rinse gargle   Try to stop smoking before you stops you  Please remember to go to the   x-ray department downstairs for your tests - we will call you with the results when they are available.     Please schedule a follow up office visit in 6 weeks, call sooner if needed

## 2011-08-18 NOTE — Telephone Encounter (Signed)
Ok if paces herself but if doesn't think she can do it ok to give work note until Monday the 28th and plan on seeing [place] that week if not able to work yet cause should be feeling better

## 2011-08-19 ENCOUNTER — Telehealth: Payer: Self-pay | Admitting: Internal Medicine

## 2011-08-19 NOTE — Telephone Encounter (Signed)
lmomtcb  

## 2011-08-19 NOTE — Progress Notes (Signed)
Quick Note:  LMTCB ______ 

## 2011-08-19 NOTE — Assessment & Plan Note (Addendum)
-   PFT's 05/18/11 FEV1 1.74 (59%) ratio 62 and DLCO 92  GOLD II COPD and still smokng, dicussed separately.  rec trial of advair as prn combivent not effective  The proper method of use, as well as anticipated side effects, of this dry powder  inhaler are discussed and demonstrated to the patient.

## 2011-08-19 NOTE — Assessment & Plan Note (Signed)
I took an extended  opportunity with this patient to outline the consequences of continued cigarette use  in airway disorders based on all the data we have from the multiple national lung health studies (perfomed over decades at millions of dollars in cost)  indicating that smoking cessation, not choice of inhalers or physicians, is the most important aspect of care.    In addition, I reviewed the Flethcher curve with patient that basically indicates  if you quit smoking when your best day FEV1 is still well preserved (which hers still is) it is highly unlikely you will progress to severe disease and informed the patient there was no medication on the market that has proven to change the curve or the likelihood of progression.   She needs to commit to stop smoking therefore before smoking stops her.

## 2011-08-20 ENCOUNTER — Encounter: Payer: Self-pay | Admitting: *Deleted

## 2011-08-20 NOTE — Telephone Encounter (Signed)
Ok with me to send letter as requested.

## 2011-08-20 NOTE — Telephone Encounter (Signed)
Spoke with pt. She is requesting a letter for work stating that it is okay for her to walk, as long as she paces herself. She wants to have this mailed to her. Please advise if okay to send letter, thanks

## 2011-08-20 NOTE — Telephone Encounter (Signed)
Letter created and mailed to pt.

## 2011-08-20 NOTE — Progress Notes (Signed)
Quick Note:  Spoke with pt and notified of results per Dr. Wert. Pt verbalized understanding and denied any questions.  ______ 

## 2011-10-06 ENCOUNTER — Encounter: Payer: Self-pay | Admitting: Internal Medicine

## 2011-10-06 ENCOUNTER — Ambulatory Visit (INDEPENDENT_AMBULATORY_CARE_PROVIDER_SITE_OTHER): Payer: Self-pay | Admitting: Internal Medicine

## 2011-10-06 VITALS — BP 120/76 | HR 80 | Temp 98.2°F | Ht 66.5 in | Wt 234.0 lb

## 2011-10-06 DIAGNOSIS — F17200 Nicotine dependence, unspecified, uncomplicated: Secondary | ICD-10-CM

## 2011-10-06 DIAGNOSIS — J449 Chronic obstructive pulmonary disease, unspecified: Secondary | ICD-10-CM

## 2011-10-06 DIAGNOSIS — IMO0001 Reserved for inherently not codable concepts without codable children: Secondary | ICD-10-CM

## 2011-10-06 NOTE — Patient Instructions (Addendum)
Keep working on cutting down on smoking - it's the most important aspect of your care - remember the Primitivo Gauze rule!  If not happy with the advair I would recommend a trial of symbicort 160 2 every 12 hours    If you are satisfied with your treatment plan let your doctor know and he/she can either refill your medications or you can return here when your prescription runs out.     If in any way you are not 100% satisfied,  please tell us.  If 100% better, tell your friends!

## 2011-10-06 NOTE — Progress Notes (Signed)
  Subjective:    Patient ID: Sharon [name], female    DOB: 1956/02/01   MRN: [id number]  HPI  31 yowf active smoking first aware of resp problems c/w recurrent bronchitis starting around 2009 and not completely fine in between spells on maint rx with combivent at least twice daily referred by [facility] 08/18/2011 to Pulmonary clinic.   08/18/2011 1st pulmonary eval cc persistent cough x sev months.  Most prod in am and in cold weather, minimally discolored, assoc with mod nasal congestion with some sneezing, and doe x steps x one flight.  Not much variability in doe and not much better now on combivent.  No h/o childhood asthma or allergies or unusual environmental or occupational exp rec Advair 250 one twice daily then rinse gargle  Try to stop smoking before you stops you    10/06/2011 f/u ov/Tilla Wilborn still smoking cc doe much better on advair, rare need combivent daytime. Min cough no purulent sputum    Sleeping ok without nocturnal exacerbation  of respiratory  c/o's or need for noct combivent. Also denies any obvious fluctuation of symptoms with weather or environmental changes or other aggravating or alleviating factors except as outlined above          Objective:   Physical Exam Pleasant amb wf nad Wt 234 08/18/11 > 10/06/2011  234 HEENT mild turbinate edema.  Oropharynx no thrush or excess pnd or cobblestoning.  No JVD or cervical adenopathy. Minaccessory muscle hypertrophy. Trachea midline, nl thryroid. Chest was  mn hyperinflated by percussion with diminished breath sounds and mild ncreased exp time without wheeze. Hoover sign positive at end inspiration. Regular rate and rhythm without murmur gallop or rub or increase P2 or edema.  Abd: no hsm, nl excursion. Ext warm without cyanosis or clubbing.        CXR  08/18/2011 :  No acute cardiopulmonary abnormality.    Assessment & Plan:

## 2011-10-10 NOTE — Assessment & Plan Note (Signed)
Again reviewed  the Flethcher curve with patient that basically indicates  if you quit smoking when your best day FEV1 is still well preserved (which hers is) it is highly unlikely you will progress to severe disease and informed the patient there was no medication on the market that has proven to change the curve or the likelihood of progression.  Therefore stopping smoking and maintaining abstinence is the most important aspect of care, not choice of inhalers or for that matter, doctors.

## 2011-10-10 NOTE — Assessment & Plan Note (Signed)
-   PFT's 05/18/11 FEV1 1.74 (59%) ratio 62 and DLCO 92  GOLD II and relatively well compensated despite active smoking - discussed separately    Each maintenance medication was reviewed in detail including most importantly the difference between maintenance and as needed and under what circumstances the prns are to be used.  Please see instructions for details which were reviewed in writing and the patient given a copy.

## 2011-11-02 ENCOUNTER — Telehealth: Payer: Self-pay | Admitting: Internal Medicine

## 2011-11-02 MED ORDER — FLUTICASONE-SALMETEROL 250-50 MCG/DOSE IN AEPB: 1.0000 | INHALATION_SPRAY | Freq: Two times a day (BID) | RESPIRATORY_TRACT | Status: DC

## 2011-11-02 NOTE — Telephone Encounter (Signed)
RX was printed off and placed in Sharon [name] look at for him to sign so we can send this over to [facility]

## 2011-11-03 NOTE — Telephone Encounter (Signed)
Advair RX faxed to [facility] at [phone].

## 2011-11-24 ENCOUNTER — Other Ambulatory Visit: Payer: Self-pay | Admitting: *Deleted

## 2011-11-24 MED ORDER — LETROZOLE 2.5 MG PO TABS: 2.5000 mg | ORAL_TABLET | Freq: Every day | ORAL | Status: AC

## 2011-12-06 ENCOUNTER — Encounter: Payer: Self-pay | Admitting: Oncology

## 2012-02-24 ENCOUNTER — Other Ambulatory Visit: Payer: Self-pay | Admitting: Emergency Medicine

## 2012-02-24 MED ORDER — LETROZOLE 2.5 MG PO TABS: 2.5000 mg | ORAL_TABLET | Freq: Every day | ORAL | Status: DC

## 2012-06-19 ENCOUNTER — Telehealth: Payer: Self-pay | Admitting: *Deleted

## 2012-06-19 ENCOUNTER — Other Ambulatory Visit: Payer: Self-pay | Admitting: *Deleted

## 2012-06-19 DIAGNOSIS — C50919 Malignant neoplasm of unspecified site of unspecified female breast: Secondary | ICD-10-CM

## 2012-06-19 MED ORDER — LETROZOLE 2.5 MG PO TABS: 2.5000 mg | ORAL_TABLET | Freq: Every day | ORAL | Status: DC

## 2012-06-19 NOTE — Telephone Encounter (Signed)
FORMER DR.RUBIN PT. REFILL FOR THIS MEDICATION WAS PLACED UNDER THE ON CALL PHYSICIAN FOR TODAY WHICH IS DR.HA. NOTIFIED Sharon [name] WHO WILL CONTACT PT. WITH AN APPOINTMENT WITH PT.'S NEW PHYSICIAN

## 2012-06-19 NOTE — Telephone Encounter (Signed)
Called and spoke with patient to schedule an appt.  Confirmed appt. With [employer] 07/18/12 at 115.  Then will become Dr. Darnelle Catalan.

## 2012-07-03 ENCOUNTER — Encounter: Payer: Self-pay | Admitting: *Deleted

## 2012-07-03 ENCOUNTER — Encounter: Payer: Self-pay | Admitting: Oncology

## 2012-07-03 NOTE — Progress Notes (Signed)
Mailed letter & calendar to pt. 

## 2012-07-18 ENCOUNTER — Ambulatory Visit (HOSPITAL_BASED_OUTPATIENT_CLINIC_OR_DEPARTMENT_OTHER): Payer: PRIVATE HEALTH INSURANCE | Admitting: Lab

## 2012-07-18 ENCOUNTER — Ambulatory Visit (HOSPITAL_BASED_OUTPATIENT_CLINIC_OR_DEPARTMENT_OTHER): Payer: PRIVATE HEALTH INSURANCE | Admitting: Gynecologic Oncology

## 2012-07-18 ENCOUNTER — Other Ambulatory Visit: Payer: Self-pay | Admitting: *Deleted

## 2012-07-18 ENCOUNTER — Telehealth: Payer: Self-pay | Admitting: *Deleted

## 2012-07-18 ENCOUNTER — Encounter: Payer: Self-pay | Admitting: *Deleted

## 2012-07-18 ENCOUNTER — Encounter: Payer: Self-pay | Admitting: Gynecologic Oncology

## 2012-07-18 VITALS — BP 131/80 | HR 74 | Temp 97.8°F | Resp 20 | Ht 66.0 in | Wt 220.2 lb

## 2012-07-18 DIAGNOSIS — C50919 Malignant neoplasm of unspecified site of unspecified female breast: Secondary | ICD-10-CM | POA: Insufficient documentation

## 2012-07-18 DIAGNOSIS — C50911 Malignant neoplasm of unspecified site of right female breast: Secondary | ICD-10-CM

## 2012-07-18 DIAGNOSIS — C50419 Malignant neoplasm of upper-outer quadrant of unspecified female breast: Secondary | ICD-10-CM

## 2012-07-18 DIAGNOSIS — F17200 Nicotine dependence, unspecified, uncomplicated: Secondary | ICD-10-CM

## 2012-07-18 DIAGNOSIS — Z853 Personal history of malignant neoplasm of breast: Secondary | ICD-10-CM

## 2012-07-18 DIAGNOSIS — Z170 Estrogen receptor positive status [ER+]: Secondary | ICD-10-CM

## 2012-07-18 LAB — CBC WITH DIFFERENTIAL/PLATELET
BASO%: 0.6 % (ref 0.0–2.0)
Eosinophils Absolute: 0.4 10*3/uL (ref 0.0–0.5)
HCT: 44.3 % (ref 34.8–46.6)
MCHC: 34 g/dL (ref 31.5–36.0)
MONO#: 0.6 10*3/uL (ref 0.1–0.9)
NEUT#: 6.3 10*3/uL (ref 1.5–6.5)
Platelets: 301 10*3/uL (ref 145–400)
RBC: 4.73 10*6/uL (ref 3.70–5.45)
WBC: 9 10*3/uL (ref 3.9–10.3)
lymph#: 1.7 10*3/uL (ref 0.9–3.3)

## 2012-07-18 LAB — COMPREHENSIVE METABOLIC PANEL (CC13)
ALT: 20 U/L (ref 0–55)
Albumin: 3.6 g/dL (ref 3.5–5.0)
CO2: 27 mEq/L (ref 22–29)
Calcium: 9.7 mg/dL (ref 8.4–10.4)
Chloride: 104 mEq/L (ref 98–107)
Glucose: 99 mg/dl (ref 70–99)
Sodium: 140 mEq/L (ref 136–145)
Total Protein: 7.3 g/dL (ref 6.4–8.3)

## 2012-07-18 LAB — RESEARCH LABS

## 2012-07-18 NOTE — Patient Instructions (Addendum)
Doing well.  We will contact you with the results of your lab work from today.  Plan to follow up with Dr. Darnelle Catalan in one year or sooner if needed.  Please try to get your mammogram as soon as possible and call for any questions or concerns.  Consider smoking cessation.

## 2012-07-18 NOTE — Telephone Encounter (Signed)
appts made and printed 

## 2012-07-18 NOTE — Progress Notes (Signed)
ID: Sharon [name]   DOB: 04-22-1956  MR#: [id number]  WUJ#:[id number]  PCP: Norberto Sorenson, MD SU:  Dr. Carolynne Edouard OTHER MD: [facility]  HISTORY OF PRESENT ILLNESS: Sharon [name] is a 57 year old [place] woman, who noted a mass in the upper outer aspect of her right breast on self-examination.  She proceeded to undergo a mammogram, which showed a spiculated mass in the upper outer aspect of the right breast.  A biopsy was performed of this area on 09/20/07, which revealed invasive mammary carcinoma, ER 74%, PR 27%, Ki67 30%, HER2 0.  She had an MRI on 09/29/07, which showed a 1.6cm mass.  The patient underwent a partial mastectomy and sentinel node biopsy by Dr. Carolynne Edouard on 10/10/07.  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  There was perineural invasion and the tumor was grade 2 out of 3.  The patient proceeded to undergo four cycles of Taxotere/Cytoxan, which she completed in October of 2009.  She then underwent radiation therapy from 03/15/08 to 05/15/08 under the care of Dr. Roselind Messier.  She was started on Femara in January of 2010 and has tolerated it well since.     INTERVAL HISTORY:  She presents today for continued follow up.  She reports tolerating Femara well with occasional, tolerable hot flashes.  She reports intermittent, arthritic-like aches that resolve after movement or stretching.  She denies vaginal dryness.  She has not had a mammogram since September of 2012 due to financial issues.  She states that she will have assistance soon and that she plans to schedule her mammogram.  She attends a clinic in [facility] for [facility] care services.  She has recently been working with creating mosaic glass terra cotta pots and voices concerns about grout possibly causing cancer.  She refused information about smoking cessation and states that she "has been in the pre-contemplation stage for years."  REVIEW OF  SYSTEMS: Constitutional: Feels well.  Cardiovascular: Intermittent shortness of breath related to COPD.  No chest pain or edema.  Pulmonary: Intermittent cough related to COPD.  No wheeze.  Gastrointestinal: No nausea, vomiting, or diarrhea. Bright red blood per rectum due to hemorrhoids per patient.  Last colonoscopy in April of 2011 where she was told she had hemorrhoids and diverticulosis. No change in bowel movement.  Genitourinary: No frequency, urgency, or dysuria. No vaginal bleeding or discharge.  Musculoskeletal: No myalgia or joint pain. Neurologic: No weakness, numbness, or change in gait.  Psychology: No depression, anxiety, or insomnia.  PAST MEDICAL HISTORY: Past Medical History  Diagnosis Date  . Breast cancer   . Menopause   . COPD (chronic obstructive pulmonary disease)   . Elevated blood pressure     PAST SURGICAL HISTORY: Past Surgical History  Procedure Laterality Date  . Vesicovaginal fistula closure w/ tah    . Breast lumpectomy  2009    Rt    FAMILY HISTORY Family History  Problem Relation Age of Onset  . Adopted: Yes    GYNECOLOGIC HISTORY:  Gravida 4 with 1 miscarriage, para 3 (one daughter, two sons).  Menarche at age 31, parity at age 73.  No history of hormone replacement therapy.The patient has had a prior hysterectomy in 1999 and has one ovary remaining.  SOCIAL HISTORY: The patient is divorced.  She is adopted.  The patient has master's degree.  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  The patient has one daughter and two sons.  The patient smokes approximately one pack of cigarettes per day.      ADVANCED DIRECTIVES:  None  HEALTH MAINTENANCE: History  Substance Use Topics  . Smoking status: Current Every Day Smoker -- 1.00 packs/day for 30 years    Types: Cigarettes  . Smokeless tobacco: Never Used  . Alcohol Use: No    Colonoscopy: 07/30/2009  PAP:  06/28/2011  Bone density:  01/22/11 resulting  low bone mass  No Known Allergies  Current Outpatient Prescriptions  Medication Sig Dispense Refill  . aspirin 81 MG tablet Take 81 mg by mouth daily.      . Cholecalciferol (VITAMIN D3) 2000 UNITS TABS Take 1 tablet by mouth daily.      . Fluticasone-Salmeterol (ADVAIR DISKUS) 250-50 MCG/DOSE AEPB Inhale 1 puff into the lungs 2 (two) times daily.  60 each  3  . letrozole (FEMARA) 2.5 MG tablet Take 1 tablet (2.5 mg total) by mouth daily.  30 tablet  2   No current facility-administered medications for this visit.    OBJECTIVE: Filed Vitals:   07/18/12 1336  BP: 131/80  Pulse: 74  Temp: 97.8 F (36.6 C)  Resp: 20     Body mass index is 35.56 kg/(m^2).    ECOG FS:  Symptomatic but completely ambulatory  General: Well developed, well nourished female in no acute distress. Alert and oriented x 3.  Head/ Neck: Oropharynx clear.  Sclerae anicteric.  Supple without any enlargements.  Lymph node survey: No cervical, supraclavicular, or axillary adenopathy  Cardiovascular: Regular rate and rhythm. S1 and S2 normal.  Lungs: Clear to auscultation bilaterally. No wheezes/crackles/rhonchi noted.  Skin: No rashes or lesions present. Back: No CVA tenderness.  Abdomen: Abdomen soft, non-tender and obese. Active bowel sounds in all quadrants. No evidence of a fluid wave or abdominal masses.  Breasts: Inspection negative with no nodularity, masses, erythema, or discharge noted bilaterally. Right breast s/p lumpectomy with scar well healed. Extremities: No bilateral cyanosis, edema, or clubbing.    LAB RESULTS: Lab Results  Component Value Date   WBC 11.2* 08/02/2011   NEUTROABS 7.6* 08/02/2011   HGB 14.0 08/02/2011   HCT 42.0 08/02/2011   MCV 95.9 08/02/2011   PLT 321 08/02/2011      Chemistry      Component Value Date/Time   NA 138 08/02/2011 1556   K 3.8 08/02/2011 1556   CL 99 08/02/2011 1556   CO2 27 08/02/2011 1556   BUN 9 08/02/2011 1556   CREATININE 0.66 08/02/2011 1556      Component Value  Date/Time   CALCIUM 10.0 08/02/2011 1556   ALKPHOS 92 08/02/2011 1556   AST 20 08/02/2011 1556   ALT 26 08/02/2011 1556   BILITOT 0.2* 08/02/2011 1556       Lab Results  Component Value Date   LABCA2 14 02/12/2011    No components found with this basename: LABCA125    No results found for this basename: INR,  in the last 168 hours  Urinalysis No results found for this basename: colorurine,  appearanceur,  labspec,  phurine,  glucoseu,  hgbur,  bilirubinur,  ketonesur,  proteinur,  urobilinogen,  nitrite,  leukocytesur    STUDIES: No results found.  ASSESSMENT: 57 y.o. [place] woman: #1 S/P right lumpectomy with sentinel lymph node biopsy on 10/10/07 by Dr. Carolynne Edouard for a T1c N0 M0 IDC, grade 2, Stage I, ER 74%, PR 27%,  Ki67 30%, HER2 0.  #2 She was enrolled in the TailorX Trial and completed four cycles of Taxotere and Cytoxan in October of 2009.  #3 She underwent radiation therapy from 03/15/08 to 05/15/08.  #4 She started Femara in January 2010 and has tolerated it well since.  PLAN:  She is to schedule a mammogram as soon as possible.  We will contact her with the results of her lab work from today.  She is advised to stop smoking and increase her physical activity.  She is to see Dr. Darnelle Catalan in one year with lab work at that time or sooner if needed.  She is advised to call for any questions or concerns.  The patient was reviewed with Dr. Darnelle Catalan, who spoke with the patient about future plans and recommendations.   Naitik Hermann DEAL    07/18/2012

## 2012-07-20 ENCOUNTER — Telehealth: Payer: Self-pay | Admitting: Gynecologic Oncology

## 2012-07-20 NOTE — Telephone Encounter (Signed)
Patient notified of CBC results.  No questions voiced.  Instructed to call for any needs.

## 2012-09-19 ENCOUNTER — Other Ambulatory Visit: Payer: Self-pay | Admitting: Oncology

## 2012-09-25 ENCOUNTER — Other Ambulatory Visit: Payer: Self-pay | Admitting: Medical Oncology

## 2012-09-25 DIAGNOSIS — C50919 Malignant neoplasm of unspecified site of unspecified female breast: Secondary | ICD-10-CM

## 2012-09-25 MED ORDER — LETROZOLE 2.5 MG PO TABS: 2.5000 mg | ORAL_TABLET | Freq: Every day | ORAL | Status: DC

## 2013-05-21 ENCOUNTER — Telehealth: Payer: Self-pay | Admitting: *Deleted

## 2013-05-21 NOTE — Telephone Encounter (Signed)
The number has been disconnected or no longer in service. i will mail a letter/avs to the pt showing change in her appt...td

## 2013-07-19 ENCOUNTER — Other Ambulatory Visit: Payer: PRIVATE HEALTH INSURANCE

## 2013-07-19 ENCOUNTER — Ambulatory Visit: Payer: PRIVATE HEALTH INSURANCE | Admitting: Oncology

## 2013-07-20 ENCOUNTER — Telehealth: Payer: Self-pay | Admitting: Oncology

## 2013-08-22 ENCOUNTER — Telehealth: Payer: Self-pay | Admitting: *Deleted

## 2013-08-22 NOTE — Telephone Encounter (Signed)
Attempted to reach pt to r/s appt with Dr. Jana Hakim d/t he is in [facility].  Unable to reach pt, d/t number being busy.  Will attempt to call again.

## 2013-09-05 ENCOUNTER — Other Ambulatory Visit: Payer: PRIVATE HEALTH INSURANCE

## 2013-09-05 ENCOUNTER — Ambulatory Visit: Payer: PRIVATE HEALTH INSURANCE | Admitting: Oncology

## 2014-10-22 ENCOUNTER — Telehealth: Payer: Self-pay | Admitting: Oncology

## 2014-10-22 NOTE — Telephone Encounter (Signed)
10/22/14 - 8:57 am - Patient called after receiving my letter.  She states she is doing good.  Has moved and no longer comes here for follow-up.  Just started a new job and does not have insurance yet but will establish herself with a doctor once her insurance goes into effect.  She has not had a mammogram in 2 years.  She stopped the Femara on her on about a year ago or less.  I will contact her again next year to check on her for the study.  I thanked the patient for continuing to support this clinical trial.   Chapin Assistant

## 2014-11-11 ENCOUNTER — Encounter: Payer: Self-pay | Admitting: Genetic Counselor

## 2015-11-06 ENCOUNTER — Encounter (HOSPITAL_COMMUNITY): Payer: Self-pay | Admitting: *Deleted

## 2015-11-06 ENCOUNTER — Emergency Department (HOSPITAL_COMMUNITY): Payer: 59

## 2015-11-06 ENCOUNTER — Inpatient Hospital Stay (HOSPITAL_COMMUNITY)
Admission: EM | Admit: 2015-11-06 | Discharge: 2015-11-11 | DRG: 166 | Disposition: A | Discharge status: Home / Self Care | Payer: 59 | Attending: Internal Medicine | Admitting: Internal Medicine

## 2015-11-06 DIAGNOSIS — F17210 Nicotine dependence, cigarettes, uncomplicated: Secondary | ICD-10-CM | POA: Diagnosis present

## 2015-11-06 DIAGNOSIS — Z419 Encounter for procedure for purposes other than remedying health state, unspecified: Secondary | ICD-10-CM

## 2015-11-06 DIAGNOSIS — Z853 Personal history of malignant neoplasm of breast: Secondary | ICD-10-CM

## 2015-11-06 DIAGNOSIS — J449 Chronic obstructive pulmonary disease, unspecified: Secondary | ICD-10-CM | POA: Diagnosis present

## 2015-11-06 DIAGNOSIS — R590 Localized enlarged lymph nodes: Secondary | ICD-10-CM | POA: Insufficient documentation

## 2015-11-06 DIAGNOSIS — J9601 Acute respiratory failure with hypoxia: Secondary | ICD-10-CM | POA: Diagnosis present

## 2015-11-06 DIAGNOSIS — J189 Pneumonia, unspecified organism: Secondary | ICD-10-CM | POA: Diagnosis present

## 2015-11-06 DIAGNOSIS — J440 Chronic obstructive pulmonary disease with acute lower respiratory infection: Secondary | ICD-10-CM | POA: Diagnosis not present

## 2015-11-06 DIAGNOSIS — R05 Cough: Secondary | ICD-10-CM | POA: Diagnosis not present

## 2015-11-06 DIAGNOSIS — Z7951 Long term (current) use of inhaled steroids: Secondary | ICD-10-CM

## 2015-11-06 DIAGNOSIS — R042 Hemoptysis: Secondary | ICD-10-CM | POA: Diagnosis present

## 2015-11-06 DIAGNOSIS — R918 Other nonspecific abnormal finding of lung field: Secondary | ICD-10-CM | POA: Diagnosis not present

## 2015-11-06 HISTORY — DX: Dysphonia: R49.0

## 2015-11-06 HISTORY — DX: Pneumonia, unspecified organism: J18.9

## 2015-11-06 HISTORY — DX: Prediabetes: R73.03

## 2015-11-06 HISTORY — DX: Other nonspecific abnormal finding of lung field: R91.8

## 2015-11-06 HISTORY — DX: Malignant neoplasm of unspecified site of right female breast: C50.911

## 2015-11-06 HISTORY — DX: Vitamin D deficiency, unspecified: E55.9

## 2015-11-06 LAB — CBC
HEMATOCRIT: 43.9 % (ref 36.0–46.0)
HEMOGLOBIN: 14.5 g/dL (ref 12.0–15.0)
MCH: 31 pg (ref 26.0–34.0)
MCHC: 33 g/dL (ref 30.0–36.0)
MCV: 93.8 fL (ref 78.0–100.0)
Platelets: 347 10*3/uL (ref 150–400)
RBC: 4.68 MIL/uL (ref 3.87–5.11)
RDW: 13.4 % (ref 11.5–15.5)
WBC: 14.1 10*3/uL — ABNORMAL HIGH (ref 4.0–10.5)

## 2015-11-06 LAB — BASIC METABOLIC PANEL
ANION GAP: 7 (ref 5–15)
BUN: 10 mg/dL (ref 6–20)
CHLORIDE: 103 mmol/L (ref 101–111)
CO2: 27 mmol/L (ref 22–32)
Calcium: 9.5 mg/dL (ref 8.9–10.3)
Creatinine, Ser: 0.69 mg/dL (ref 0.44–1.00)
GFR calc non Af Amer: >60 mL/min (ref >60)
Glucose, Bld: 95 mg/dL (ref 65–99)
POTASSIUM: 4.1 mmol/L (ref 3.5–5.1)
SODIUM: 137 mmol/L (ref 135–145)

## 2015-11-06 MED ORDER — VANCOMYCIN HCL 10 G IV SOLR
2000.0000 mg | Freq: Once | INTRAVENOUS | Status: DC
  Filled 2015-11-06: qty 2000

## 2015-11-06 MED ORDER — ALBUTEROL SULFATE (2.5 MG/3ML) 0.083% IN NEBU: 2.5000 mg | INHALATION_SOLUTION | RESPIRATORY_TRACT | Status: DC | PRN

## 2015-11-06 MED ORDER — PIPERACILLIN-TAZOBACTAM 3.375 G IVPB
3.3750 g | Freq: Three times a day (TID) | INTRAVENOUS | Status: DC
  Administered 2015-11-07 – 2015-11-11 (×13): 3.375 g via INTRAVENOUS
  Filled 2015-11-06 (×18): qty 50

## 2015-11-06 MED ORDER — MOMETASONE FURO-FORMOTEROL FUM 200-5 MCG/ACT IN AERO: 2.0000 | INHALATION_SPRAY | Freq: Two times a day (BID) | RESPIRATORY_TRACT | Status: DC

## 2015-11-06 MED ORDER — PIPERACILLIN-TAZOBACTAM 3.375 G IVPB 30 MIN: 3.3750 g | Freq: Three times a day (TID) | INTRAVENOUS | Status: DC

## 2015-11-06 MED ORDER — IPRATROPIUM BROMIDE 0.02 % IN SOLN
0.5000 mg | Freq: Once | RESPIRATORY_TRACT | Status: AC
  Administered 2015-11-06: 0.5 mg via RESPIRATORY_TRACT
  Filled 2015-11-06: qty 2.5

## 2015-11-06 MED ORDER — FLUTICASONE PROPIONATE 50 MCG/ACT NA SUSP
2.0000 | Freq: Every day | NASAL | Status: DC
  Administered 2015-11-07 – 2015-11-11 (×4): 2 via NASAL
  Filled 2015-11-06: qty 16

## 2015-11-06 MED ORDER — MOMETASONE FURO-FORMOTEROL FUM 200-5 MCG/ACT IN AERO
2.0000 | INHALATION_SPRAY | Freq: Two times a day (BID) | RESPIRATORY_TRACT | Status: DC
  Administered 2015-11-07 – 2015-11-11 (×9): 2 via RESPIRATORY_TRACT
  Filled 2015-11-06: qty 8.8

## 2015-11-06 MED ORDER — ACETAMINOPHEN 650 MG RE SUPP: 650.0000 mg | Freq: Four times a day (QID) | RECTAL | Status: DC | PRN

## 2015-11-06 MED ORDER — ALBUTEROL SULFATE HFA 108 (90 BASE) MCG/ACT IN AERS: 1.0000 | INHALATION_SPRAY | Freq: Four times a day (QID) | RESPIRATORY_TRACT | Status: DC | PRN

## 2015-11-06 MED ORDER — ACETAMINOPHEN 325 MG PO TABS: 650.0000 mg | ORAL_TABLET | Freq: Four times a day (QID) | ORAL | Status: DC | PRN

## 2015-11-06 MED ORDER — ONDANSETRON HCL 4 MG/2ML IJ SOLN
4.0000 mg | Freq: Four times a day (QID) | INTRAMUSCULAR | Status: DC | PRN
  Administered 2015-11-10: 4 mg via INTRAVENOUS
  Filled 2015-11-06: qty 2

## 2015-11-06 MED ORDER — VANCOMYCIN HCL 10 G IV SOLR: 1250.0000 mg | Freq: Two times a day (BID) | INTRAVENOUS | Status: DC

## 2015-11-06 MED ORDER — MONTELUKAST SODIUM 10 MG PO TABS
10.0000 mg | ORAL_TABLET | Freq: Every day | ORAL | Status: DC
  Administered 2015-11-07 – 2015-11-10 (×5): 10 mg via ORAL
  Filled 2015-11-06 (×6): qty 1

## 2015-11-06 MED ORDER — ALBUTEROL SULFATE (2.5 MG/3ML) 0.083% IN NEBU
5.0000 mg | INHALATION_SOLUTION | Freq: Once | RESPIRATORY_TRACT | Status: AC
  Administered 2015-11-06: 5 mg via RESPIRATORY_TRACT
  Filled 2015-11-06: qty 6

## 2015-11-06 MED ORDER — VANCOMYCIN HCL 10 G IV SOLR
1250.0000 mg | Freq: Two times a day (BID) | INTRAVENOUS | Status: DC
  Administered 2015-11-06 – 2015-11-08 (×5): 1250 mg via INTRAVENOUS
  Filled 2015-11-06 (×6): qty 1250

## 2015-11-06 MED ORDER — ONDANSETRON HCL 4 MG PO TABS: 4.0000 mg | ORAL_TABLET | Freq: Four times a day (QID) | ORAL | Status: DC | PRN

## 2015-11-06 MED ORDER — PIPERACILLIN-TAZOBACTAM 3.375 G IVPB 30 MIN
3.3750 g | Freq: Once | INTRAVENOUS | Status: AC
  Administered 2015-11-06: 3.375 g via INTRAVENOUS
  Filled 2015-11-06: qty 50

## 2015-11-06 MED ORDER — VANCOMYCIN HCL IN DEXTROSE 1-5 GM/200ML-% IV SOLN
1000.0000 mg | Freq: Once | INTRAVENOUS | Status: DC
  Administered 2015-11-06: 1000 mg via INTRAVENOUS
  Filled 2015-11-06: qty 200

## 2015-11-06 NOTE — ED Notes (Signed)
Pt was sent to ED from PCP. States she has had recurrent Pneumonia for almost 2 months. States she has been treated with Levaquin and Zithromax and Pneumonia has not gotten better. States she had a CT chest 2 days ago that showed pneumonia and a new mass. PCP sent pt to ED for IV antibiotics and possible biopsy. Pt states that she still has a congested cough. States she just used her inhaler and hasn't coughed since.

## 2015-11-06 NOTE — ED Notes (Signed)
Pt placed on 2L O2 via [place] d/t borderline SpO2.

## 2015-11-06 NOTE — Progress Notes (Signed)
Pharmacy Antibiotic Note Sharon [name] is a 60 y.o. female admitted on 11/06/2015 with pneumonia.  Pharmacy has been consulted for Zosyn and vancomycin dosing. Pt received Zosyn 3.375 grams and vancomycin 1000 mg x 1 in the ED.  Plan: 1. Begin Vancomycin 1250 mg q 12 hours with first dose now to complete loading dose 2. Zosyn 3.375 grams IV every 8 hours infused over 4 hours   Weight: 246 lb (111.585 kg)  Temp (24hrs), Avg:98.5 F (36.9 C), Min:98.3 F (36.8 C), Max:98.7 F (37.1 C)   Recent Labs Lab 11/06/15 1529  WBC 14.1*  CREATININE 0.69    CrCl cannot be calculated (Unknown ideal weight.).    No Known Allergies  Antimicrobials this admission: 7/13 Zoysn >>  7/13 Vancomycin   >>   Dose adjustments this admission: n/a  Microbiology results: 7/13 BCx: px   Thank you for allowing pharmacy to be a part of this patient's care.  Vincenza Hews, PharmD, BCPS 11/06/2015, 9:28 PM Pager: [phone]

## 2015-11-06 NOTE — H&P (Signed)
History and Physical    Sharon [name] WUX:[id number] DOB: 1955/08/06 DOA: 11/06/2015  PCP: [name], PA  Patient coming from: Home.  Chief Complaint: Productive cough.  HPI: Sharon [name] is a 60 y.o. female with breast cancer in remission, COPD and ongoing tobacco abuse was referred to the ER after patient's CT scan of the chest done on July 11 at an outside facility results of which are available in care every year showing the following features -   IMPRESSION: 1.  Infiltrative mass in the region of the left hilum, left mediastinum and left upper lobe difficult to delineate from adjacent areas of pneumonia measuring approximately 5.6 x 6.4 cm. This may be a primary lung cancer or, less likely, metastatic breast  cancer. The mass narrows multiple pulmonary arteries and bronchi. 2.  Left upper lobe and lingular pneumonia. 3.  Mediastinal lymphadenopathy.   Patient has been having productive cough for last 2-3 months. Patient had at least 5 courses of antibiotics despite which patient is still having productive cough. The last of which patient was on was Levaquin and Zithromax. Denies any chest pain fever chills. Patient's primary care physician ordered a CT scan which showed the above features mainly concerning for mass and was referred to the ER. Patient otherwise is not in distress. Patient has mild shortness of breath at exertion.   ED Course: As per the history of present illness.  Review of Systems: As per HPI, rest all negative.   Past Medical History  Diagnosis Date  . Menopause   . COPD (chronic obstructive pulmonary disease) ([place])   . Elevated blood pressure   . Vitamin D deficiency   . Pre-diabetes   . CAP (community acquired pneumonia) 11/06/2015  . Cancer of right breast ([place]) 2009  . Lung mass dx'd 10/2015  . Hoarseness of voice     "for the last 2 months" (11/06/2015)    Past Surgical History  Procedure Laterality Date  . Vesicovaginal fistula closure w/  [place]  2000  . Uterine fibroid surgery  2000  . Breast biopsy Right 2009  . Breast lumpectomy Right 2009  . Vaginal hysterectomy  2000     reports that she has been smoking Cigarettes.  She has a 23 pack-year smoking history. She has never used smokeless tobacco. She reports that she uses illicit drugs (Marijuana). She reports that she does not drink alcohol.  No Known Allergies  Family History  Problem Relation Age of Onset  . Adopted: Yes    Prior to Admission medications   Medication Sig Start Date End Date Taking? Authorizing Provider  albuterol (PROVENTIL HFA;VENTOLIN HFA) 108 (90 Base) MCG/ACT inhaler Inhale 1 puff into the lungs every 6 (six) hours as needed for wheezing or shortness of breath.   Yes Historical Provider, MD  budesonide-formoterol (SYMBICORT) 160-4.5 MCG/ACT inhaler Inhale 2 puffs into the lungs 2 (two) times daily.   Yes Historical Provider, MD  fluticasone (VERAMYST) 27.5 MCG/SPRAY nasal spray Place 2 sprays into the nose daily.   Yes Historical Provider, MD  montelukast (SINGULAIR) 10 MG tablet Take 10 mg by mouth at bedtime.   Yes Historical Provider, MD  Fluticasone-Salmeterol (ADVAIR DISKUS) 250-50 MCG/DOSE AEPB Inhale 1 puff into the lungs 2 (two) times daily. 11/02/11 11/01/12  Tanda Rockers, MD  letrozole [facility]) 2.5 MG tablet Take 1 tablet (2.5 mg total) by mouth daily. Patient not taking: Reported on 11/06/2015 09/25/12   Chauncey Cruel, MD    Physical  Exam: Filed Vitals:   11/06/15 2000 11/06/15 2100 11/06/15 2130 11/06/15 2221  BP: 116/60 115/53 122/64 132/72  Pulse: 88 90 92 90  Temp:    98.8 F (37.1 C)  TempSrc:      Resp: '17 21 26   '$ Weight: 246 lb (111.585 kg)     SpO2: 94% 90% 96% 99%      Constitutional: Not in distress. Filed Vitals:   11/06/15 2000 11/06/15 2100 11/06/15 2130 11/06/15 2221  BP: 116/60 115/53 122/64 132/72  Pulse: 88 90 92 90  Temp:    98.8 F (37.1 C)  TempSrc:      Resp: '17 21 26   '$ Weight: 246 lb (111.585 kg)      SpO2: 94% 90% 96% 99%   Eyes: Anicteric no pallor. ENMT: No discharge from the ears eyes nose or mouth. Neck: No mass felt. No JVD appreciated. Respiratory: No rhonchi or crepitations. Cardiovascular: S1 and S2 heard. Abdomen: Soft nontender bowel sounds present. Musculoskeletal: No edema. Skin: No rash. Neurologic: Alert awake oriented to time place and person. Moves all extremities. Psychiatric: Appears normal.   Labs on Admission: I have personally reviewed following labs and imaging studies  CBC:  Recent Labs Lab 11/06/15 1529  WBC 14.1*  HGB 14.5  HCT 43.9  MCV 93.8  PLT 854   Basic Metabolic Panel:  Recent Labs Lab 11/06/15 1529  NA 137  K 4.1  CL 103  CO2 27  GLUCOSE 95  BUN 10  CREATININE 0.69  CALCIUM 9.5   GFR: CrCl cannot be calculated (Unknown ideal weight.). Liver Function Tests: No results for input(s): AST, ALT, ALKPHOS, BILITOT, PROT, ALBUMIN in the last 168 hours. No results for input(s): LIPASE, AMYLASE in the last 168 hours. No results for input(s): AMMONIA in the last 168 hours. Coagulation Profile: No results for input(s): INR, PROTIME in the last 168 hours. Cardiac Enzymes: No results for input(s): CKTOTAL, CKMB, CKMBINDEX, TROPONINI in the last 168 hours. BNP (last 3 results) No results for input(s): PROBNP in the last 8760 hours. HbA1C: No results for input(s): HGBA1C in the last 72 hours. CBG: No results for input(s): GLUCAP in the last 168 hours. Lipid Profile: No results for input(s): CHOL, HDL, LDLCALC, TRIG, CHOLHDL, LDLDIRECT in the last 72 hours. Thyroid Function Tests: No results for input(s): TSH, T4TOTAL, FREET4, T3FREE, THYROIDAB in the last 72 hours. Anemia Panel: No results for input(s): VITAMINB12, FOLATE, FERRITIN, TIBC, IRON, RETICCTPCT in the last 72 hours. Urine analysis: No results found for: COLORURINE, APPEARANCEUR, LABSPEC, PHURINE, GLUCOSEU, HGBUR, BILIRUBINUR, KETONESUR, PROTEINUR, UROBILINOGEN,  NITRITE, LEUKOCYTESUR Sepsis Labs: '@LABRCNTIP'$ (procalcitonin:4,lacticidven:4) )No results found for this or any previous visit (from the past 240 hour(s)).   Radiological Exams on Admission: Dg Chest 2 View  11/06/2015  CLINICAL DATA:  Recurrent pneumonia for almost 2 months. By report had a CT scan 2 days ago showing pneumonia and a new mass. EXAM: CHEST  2 VIEW COMPARISON:  Chest x-ray 08/18/2011. FINDINGS: Left upper lobe airspace opacity tracks into the hilum. Right lung is clear. The cardiopericardial silhouette is within normal limits for size. The visualized bony structures of the thorax are intact. IMPRESSION: Left upper lobe airspace disease tracking centrally into the left hilum. Central obstructing neoplasm could have this appearance. Comparison to the recent CT scan is recommended. Electronically Signed   By: Misty Stanley M.D.   On: 11/06/2015 15:49     Assessment/Plan Principal Problem:   CAP (community acquired pneumonia) Active Problems:  COPD (chronic obstructive pulmonary disease) ([name])   Lung mass   Pneumonia    1. Pneumonia - suspect postobstructive given the lung mass. Patient has had multiple courses of antibiotics. At this time patient has been placed on vancomycin and Zosyn. 2. Lung mass - I have consulted pulmonary critical care for further recommendations and may need possible bronch and biopsy. 3. History of breast cancer in remission - presently CT scan showing new mass in the lung. 4. History of COPD - not actively wheezing. Continue home inhalers. 5. Tobacco abuse - tobacco cessation counseling requested.   DVT prophylaxis: SCDs. In anticipation of procedure. Code Status: Full code.  Family Communication: No family at the bedside.  Disposition Plan: Home.  Consults called: Pulmonary critical care.  Admission status: Observation. MedSurg.    Rise Patience MD [facility] Pager [phone].  If 7PM-7AM, please contact  night-coverage [email or web address] Password [facility]  11/06/2015, 11:26 PM

## 2015-11-06 NOTE — ED Provider Notes (Signed)
CSN: [id number]     Arrival date & time 11/06/15  1459 History   First MD Initiated Contact with Patient 11/06/15 1811     Chief Complaint  Patient presents with  . Pneumonia     (Consider location/radiation/quality/duration/timing/severity/associated sxs/prior Treatment) HPI Comments: 60 year old female with history of breast cancer, COPD presents for pneumonia. The patient states that she's had cough as well as hoarseness to her voice for 2 months. She was treated outpatient with azithromycin and Levaquin orally. She has continued to have cough and hoarseness to her voice. She is also continued to have uncontrolled wheezing. She followed up with her primary care physician and had a CT done outpatient of her chest 2 days ago that shows a lung mass as well as surrounding pneumonia. As she has failed outpatient treatment twice the patient was directed by her primary care physician to come to the emergency department for admission for IV antibiotics and possible workup for the lung mass. Patient denies recent fevers or chills. She still has not felt quite herself but says that she has felt somewhat better than she did initially. Patient is a smoker but has not been smoking as much as usual since she started getting sick 2 months ago. Patient did undergo radiation treatment for her breast cancer previously.  Patient is a 60 y.o. female presenting with pneumonia.  Pneumonia Associated symptoms include shortness of breath. Pertinent negatives include no chest pain, no abdominal pain and no headaches.    Past Medical History  Diagnosis Date  . Breast cancer ([place])   . Menopause   . COPD (chronic obstructive pulmonary disease) ([place])   . Elevated blood pressure   . Vitamin D deficiency   . Pre-diabetes    Past Surgical History  Procedure Laterality Date  . Vesicovaginal fistula closure w/ [place]    . Breast lumpectomy  2009    Rt  . Abdominal hysterectomy    . Fibroid tumor     Family History   Problem Relation Age of Onset  . Adopted: Yes   Social History  Substance Use Topics  . Smoking status: Current Every Day Smoker -- 0.50 packs/day for 30 years    Types: Cigarettes  . Smokeless tobacco: Never Used  . Alcohol Use: No   OB History    No data available     Review of Systems  Constitutional: Positive for fatigue. Negative for fever and chills.  HENT: Positive for voice change. Negative for congestion, sore throat and trouble swallowing.   Eyes: Negative for visual disturbance.  Respiratory: Positive for cough, shortness of breath and wheezing. Negative for chest tightness.   Cardiovascular: Negative for chest pain and palpitations.  Gastrointestinal: Negative for nausea, vomiting, abdominal pain and diarrhea.  Genitourinary: Negative for dysuria, urgency and hematuria.  Musculoskeletal: Negative for myalgias and back pain.  Skin: Negative for rash.  Neurological: Negative for dizziness, weakness and headaches.  Hematological: Does not bruise/bleed easily.      Allergies  Review of patient's allergies indicates no known allergies.  Home Medications   Prior to Admission medications   Medication Sig Start Date End Date Taking? Authorizing Provider  albuterol (PROVENTIL HFA;VENTOLIN HFA) 108 (90 Base) MCG/ACT inhaler Inhale 1 puff into the lungs every 6 (six) hours as needed for wheezing or shortness of breath.   Yes Historical Provider, MD  budesonide-formoterol (SYMBICORT) 160-4.5 MCG/ACT inhaler Inhale 2 puffs into the lungs 2 (two) times daily.   Yes Historical Provider, MD  fluticasone (VERAMYST)  27.5 MCG/SPRAY nasal spray Place 2 sprays into the nose daily.   Yes Historical Provider, MD  montelukast (SINGULAIR) 10 MG tablet Take 10 mg by mouth at bedtime.   Yes Historical Provider, MD  Fluticasone-Salmeterol (ADVAIR DISKUS) 250-50 MCG/DOSE AEPB Inhale 1 puff into the lungs 2 (two) times daily. 11/02/11 11/01/12  Tanda Rockers, MD  letrozole [facility]) 2.5 MG  tablet Take 1 tablet (2.5 mg total) by mouth daily. Patient not taking: Reported on 11/06/2015 09/25/12   Chauncey Cruel, MD   BP 122/64 mmHg  Pulse 92  Temp(Src) 98.7 F (37.1 C) (Oral)  Resp 26  Wt 246 lb (111.585 kg)  SpO2 96% Physical Exam  Constitutional: She is oriented to person, place, and time. She appears well-developed and well-nourished. No distress.  HENT:  Head: Normocephalic and atraumatic.  Right Ear: External ear normal.  Left Ear: External ear normal.  Nose: Nose normal.  Mouth/Throat: Oropharynx is clear and moist. No oropharyngeal exudate.  Eyes: EOM are normal. Pupils are equal, round, and reactive to light.  Neck: Normal range of motion. Neck supple.  Cardiovascular: Normal rate, regular rhythm, normal heart sounds and intact distal pulses.   No murmur heard. Pulmonary/Chest: Effort normal. No respiratory distress. She has wheezes. She has rales (left upper lung).  Abdominal: Soft. She exhibits no distension. There is no tenderness.  Musculoskeletal: Normal range of motion. She exhibits no edema or tenderness.  Neurological: She is alert and oriented to person, place, and time.  Skin: Skin is warm and dry. No rash noted. She is not diaphoretic.  Vitals reviewed.   ED Course  Procedures (including critical care time) Labs Review Labs Reviewed  CBC - Abnormal; Notable for the following:    WBC 14.1 (*)    All other components within normal limits  BASIC METABOLIC PANEL    Imaging Review Dg Chest 2 View  11/06/2015  CLINICAL DATA:  Recurrent pneumonia for almost 2 months. By report had a CT scan 2 days ago showing pneumonia and a new mass. EXAM: CHEST  2 VIEW COMPARISON:  Chest x-ray 08/18/2011. FINDINGS: Left upper lobe airspace opacity tracks into the hilum. Right lung is clear. The cardiopericardial silhouette is within normal limits for size. The visualized bony structures of the thorax are intact. IMPRESSION: Left upper lobe airspace disease tracking  centrally into the left hilum. Central obstructing neoplasm could have this appearance. Comparison to the recent CT scan is recommended. Electronically Signed   By: Misty Stanley M.D.   On: 11/06/2015 15:49   I have personally reviewed and evaluated these images and lab results as part of my medical decision-making.   EKG Interpretation None      MDM  Patient was seen and evaluated in stable condition. Patient with pneumonia as well as lung mass on CT done through her nose on and imaging. Radiology read available through care every where. Patient relatively well-appearing despite having pneumonia 2 months and failing 2 courses of outpatient antibiotics. Patient with leukocytosis on her labs. Vital stable. Discussed antibiotic options with pharmacist who recommended vancomycin at this time. These antibiotics were initiated. Discussed case with Dr. Hal Hope from [facility] who agreed with admission. Patient admitted under his care for continued treatment. Final diagnoses:  CAP (community acquired pneumonia)    1. Pneumonia  2. Lung mass    Harvel Quale, MD 11/06/15 2142

## 2015-11-07 DIAGNOSIS — R05 Cough: Secondary | ICD-10-CM | POA: Diagnosis present

## 2015-11-07 DIAGNOSIS — J189 Pneumonia, unspecified organism: Secondary | ICD-10-CM | POA: Diagnosis present

## 2015-11-07 DIAGNOSIS — J410 Simple chronic bronchitis: Secondary | ICD-10-CM

## 2015-11-07 DIAGNOSIS — F17210 Nicotine dependence, cigarettes, uncomplicated: Secondary | ICD-10-CM | POA: Diagnosis present

## 2015-11-07 DIAGNOSIS — R042 Hemoptysis: Secondary | ICD-10-CM | POA: Diagnosis present

## 2015-11-07 DIAGNOSIS — J449 Chronic obstructive pulmonary disease, unspecified: Secondary | ICD-10-CM | POA: Diagnosis not present

## 2015-11-07 DIAGNOSIS — R599 Enlarged lymph nodes, unspecified: Secondary | ICD-10-CM | POA: Diagnosis not present

## 2015-11-07 DIAGNOSIS — R918 Other nonspecific abnormal finding of lung field: Secondary | ICD-10-CM

## 2015-11-07 DIAGNOSIS — Z853 Personal history of malignant neoplasm of breast: Secondary | ICD-10-CM | POA: Diagnosis not present

## 2015-11-07 DIAGNOSIS — Z7951 Long term (current) use of inhaled steroids: Secondary | ICD-10-CM | POA: Diagnosis not present

## 2015-11-07 DIAGNOSIS — R590 Localized enlarged lymph nodes: Secondary | ICD-10-CM | POA: Diagnosis present

## 2015-11-07 DIAGNOSIS — J440 Chronic obstructive pulmonary disease with acute lower respiratory infection: Secondary | ICD-10-CM | POA: Diagnosis present

## 2015-11-07 DIAGNOSIS — J9601 Acute respiratory failure with hypoxia: Secondary | ICD-10-CM | POA: Diagnosis present

## 2015-11-07 LAB — COMPREHENSIVE METABOLIC PANEL
ALBUMIN: 3.4 g/dL — AB (ref 3.5–5.0)
ALT: 26 U/L (ref 14–54)
ANION GAP: 8 (ref 5–15)
AST: 25 U/L (ref 15–41)
Alkaline Phosphatase: 65 U/L (ref 38–126)
BUN: 10 mg/dL (ref 6–20)
CHLORIDE: 102 mmol/L (ref 101–111)
CO2: 27 mmol/L (ref 22–32)
Calcium: 9.1 mg/dL (ref 8.9–10.3)
Creatinine, Ser: 0.86 mg/dL (ref 0.44–1.00)
GFR calc Af Amer: >60 mL/min (ref >60)
Glucose, Bld: 151 mg/dL — ABNORMAL HIGH (ref 65–99)
POTASSIUM: 3.4 mmol/L — AB (ref 3.5–5.1)
Sodium: 137 mmol/L (ref 135–145)
Total Bilirubin: 0.8 mg/dL (ref 0.3–1.2)
Total Protein: 6.5 g/dL (ref 6.5–8.1)

## 2015-11-07 LAB — HIV ANTIBODY (ROUTINE TESTING W REFLEX): HIV Screen 4th Generation wRfx: NONREACTIVE

## 2015-11-07 LAB — CBC
HEMATOCRIT: 40.2 % (ref 36.0–46.0)
HEMOGLOBIN: 13.5 g/dL (ref 12.0–15.0)
MCH: 31.8 pg (ref 26.0–34.0)
MCHC: 33.6 g/dL (ref 30.0–36.0)
MCV: 94.6 fL (ref 78.0–100.0)
PLATELETS: 341 10*3/uL (ref 150–400)
RBC: 4.25 MIL/uL (ref 3.87–5.11)
RDW: 13.5 % (ref 11.5–15.5)
WBC: 12.8 10*3/uL — AB (ref 4.0–10.5)

## 2015-11-07 LAB — LACTIC ACID, PLASMA
Lactic Acid, Venous: 2.4 mmol/L (ref 0.5–1.9)
Lactic Acid, Venous: 1.4 mmol/L (ref 0.5–1.9)

## 2015-11-07 LAB — STREP PNEUMONIAE URINARY ANTIGEN: Strep Pneumo Urinary Antigen: NEGATIVE

## 2015-11-07 LAB — PROCALCITONIN: Procalcitonin: <0.1 ng/mL

## 2015-11-07 MED ORDER — POTASSIUM CHLORIDE 20 MEQ PO PACK
40.0000 meq | PACK | Freq: Once | ORAL | Status: DC
  Filled 2015-11-07: qty 2

## 2015-11-07 NOTE — Progress Notes (Signed)
   11/07/15 1508  Clinical Encounter Type  Visited With Patient and family together;Health care provider  Visit Type Initial;Spiritual support  Referral From Patient;Nurse  Spiritual Encounters  Spiritual Needs Prayer;[facility] text;Emotional  Stress Factors  Patient Stress Factors Health changes;Lack of knowledge   Chaplain responded to a consult. Chaplain met with patient who has recently learned of a new mass in her lungs. Chaplain offered prayer and support, reviewed sacred text, facilitated a life review, and explored emotions. Chaplain introduced spiritual care services. Spiritual care services available as needed.   Jeri Lager, Chaplain 11/07/2015 3:09 PM

## 2015-11-07 NOTE — Progress Notes (Signed)
CRITICAL VALUE ALERT  Critical value received:  Lactic Acid 2.4  Date of notification:  7/14  Time of notification:  8:02 am  Critical value read back:Yes.    Nurse who received alert:  Ethelda Chick   MD notified (1st page):  Dr. Marthenia Rolling  Time of first page:  8:05 am

## 2015-11-07 NOTE — Consult Note (Signed)
PULMONARY / CRITICAL CARE MEDICINE   Name: Sharon [name] MRN: [id number] DOB: [birth year]    ADMISSION DATE:  11/06/2015 CONSULTATION DATE:  11/07/2015  REFERRING MD:  Sharon [name]  CHIEF COMPLAINT:  cough  HISTORY OF PRESENT ILLNESS:   Sharon [name] is a pleasant 60 y/o female who smokes and has a history of COPD and breast cancer presented on 7/14 with a possible lung mass.   She continues to smoke at least 1 ppd, she smokes marijuana.  She takes ventolin and symbicort and montelukast for her COPD.    She developed hoarseness and dry cough about 6 weeks ago after exposure to some animals in a trailer.  After that she had significant sinus congestion and drainage.  She saw urgent care and was treated with Levaquin once and azithromycin once. Despite this her cough and sinus congestion persisted.  During this time she has experienced night sweats but denies hemoptysis, weight loss.  She has had two months of night sweats and hoarseness.    PAST MEDICAL HISTORY :  She  has a past medical history of Menopause; COPD (chronic obstructive pulmonary disease) ([place]); Elevated blood pressure; Vitamin D deficiency; Pre-diabetes; CAP (community acquired pneumonia) (11/06/2015); Cancer of right breast ([place]) (2009); Lung mass (dx'd 10/2015); and Hoarseness of voice.  PAST SURGICAL HISTORY: She  has past surgical history that includes Vesicovaginal fistula closure w/ TAH (2000); Uterine fibroid surgery (2000); Breast biopsy (Right, 2009); Breast lumpectomy (Right, 2009); and Vaginal hysterectomy (2000).  No Known Allergies  No current facility-administered medications on file prior to encounter.   Current Outpatient Prescriptions on File Prior to Encounter  Medication Sig  . Fluticasone-Salmeterol (ADVAIR DISKUS) 250-50 MCG/DOSE AEPB Inhale 1 puff into the lungs 2 (two) times daily.  Marland Kitchen letrozole (FEMARA) 2.5 MG tablet Take 1 tablet (2.5 mg total) by mouth daily. (Patient not taking: Reported on 11/06/2015)     FAMILY HISTORY:  Her is adopted.  SOCIAL HISTORY: She  reports that she has been smoking Cigarettes.  She has a 23 pack-year smoking history. She has never used smokeless tobacco. She reports that she uses illicit drugs (Marijuana). She reports that she does not drink alcohol.  REVIEW OF SYSTEMS:   Gen: Denies fever, chills, weight change, fatigue, + night sweats HEENT: Denies blurred vision, double vision, hearing loss, tinnitus, sinus congestion, rhinorrhea, sore throat, neck stiffness, dysphagia PULM: per HPI CV: Denies chest pain, edema, orthopnea, paroxysmal nocturnal dyspnea, palpitations GI: Denies abdominal pain, nausea, vomiting, diarrhea, hematochezia, melena, constipation, change in bowel habits GU: Denies dysuria, hematuria, polyuria, oliguria, urethral discharge Endocrine: Denies hot or cold intolerance, polyuria, polyphagia or appetite change Derm: Denies rash, dry skin, scaling or peeling skin change Heme: Denies easy bruising, bleeding, bleeding gums Neuro: Denies headache, numbness, weakness, slurred speech, loss of memory or consciousness   SUBJECTIVE:  As above  VITAL SIGNS: BP 99/40 mmHg  Pulse 73  Temp(Src) 99.1 F (37.3 C) (Oral)  Resp 18  Wt 111.585 kg (246 lb)  SpO2 93%  HEMODYNAMICS:    VENTILATOR SETTINGS:    INTAKE / OUTPUT:    PHYSICAL EXAMINATION: General:  Obese, no distress Neuro:  Awake, alert, oriented, moves all four extremities HEENT:  NCAT, neck supple, no lymphadenopathy Cardiovascular:  RRR, no mgr Lungs:  CTA B, normal effort Abdomen:  BS+, soft, nontender Musculoskeletal:  MSK, no bony abnormality Skin:  No rash or skin breakdown  LABS:  BMET  Recent Labs Lab 11/06/15 1529 11/07/15 0140  NA 137 137  K 4.1 3.4*  CL 103 102  CO2 27 27  BUN 10 10  CREATININE 0.69 0.86  GLUCOSE 95 151*    Electrolytes  Recent Labs Lab 11/06/15 1529 11/07/15 0140  CALCIUM 9.5 9.1    CBC  Recent Labs Lab  11/06/15 1529 11/07/15 0140  WBC 14.1* 12.8*  HGB 14.5 13.5  HCT 43.9 40.2  PLT 347 341    Coag's No results for input(s): APTT, INR in the last 168 hours.  Sepsis Markers  Recent Labs Lab 11/07/15 0652 11/07/15 0956  LATICACIDVEN 2.4* 1.4    ABG No results for input(s): PHART, PCO2ART, PO2ART in the last 168 hours.  Liver Enzymes  Recent Labs Lab 11/07/15 0140  AST 25  ALT 26  ALKPHOS 65  BILITOT 0.8  ALBUMIN 3.4*    Cardiac Enzymes No results for input(s): TROPONINI, PROBNP in the last 168 hours.  Glucose No results for input(s): GLUCAP in the last 168 hours.  Imaging Dg Chest 2 View  11/06/2015  CLINICAL DATA:  Recurrent pneumonia for almost 2 months. By report had a CT scan 2 days ago showing pneumonia and a new mass. EXAM: CHEST  2 VIEW COMPARISON:  Chest x-ray 08/18/2011. FINDINGS: Left upper lobe airspace opacity tracks into the hilum. Right lung is clear. The cardiopericardial silhouette is within normal limits for size. The visualized bony structures of the thorax are intact. IMPRESSION: Left upper lobe airspace disease tracking centrally into the left hilum. Central obstructing neoplasm could have this appearance. Comparison to the recent CT scan is recommended. Electronically Signed   By: Misty Stanley M.D.   On: 11/06/2015 15:49     STUDIES:  7/11 CT chest > left upper lobe mass, mediastinal lymphadenopathy; cannot see images, only see the   CULTURES: 7/14 sputum >   ANTIBIOTICS: 7/14 Vanc > 7/14 Zosyn >    DISCUSSION: 60 y/o female smoker with a lung mass. Overall picture is worrisome for lung cancer or metastatic breast cancer.  May also have left upper lobe post obstructive pneumonia but appears well and currently not septic.  ASSESSMENT / PLAN:  PULMONARY A: Lung mass with mediastinal lymphadenopathy Post obstructive pneumonia P:   Will plan for EBUS on Monday with mediastinal lymph node biopsy Need to obtain the CD with images of  the CT scan ASAP for review NPO after midnight on Sunday night Consider stopping/narrowing antibiotics   Roselie Awkward, MD [facility] Pager: [phone] Cell: [phone] After 3pm or if no response, call [phone]  11/07/2015, 11:56 AM

## 2015-11-07 NOTE — Progress Notes (Signed)
PROGRESS NOTE    Sharon [name]  VQM:[id number] DOB: [birth year] DOA: 11/06/2015 PCP: [name], PA  Outpatient Specialists:   Brief Narrative: As per Dr. Clarene Duke: 60 y.o. female with breast cancer in remission, COPD and ongoing tobacco abuse was referred to the ER after patient's CT scan of the chest done on July 11 at an outside facility results of which are available in care every year showing the following features - "Infiltrative mass in the region of the left hilum, left mediastinum and left upper lobe difficult to delineate from adjacent areas of pneumonia measuring approximately 5.6 x 6.4 cm. This may be a primary lung cancer or, less likely, metastatic breast cancer. The mass narrows multiple pulmonary arteries and bronchi. Left upper lobe and lingular pneumonia. Mediastinal lymphadenopathy". Patient has been having productive cough for last 2-3 months. Patient had at least 5 courses of antibiotics despite which patient is still having productive cough. The last of which patient was on was Levaquin and Zithromax. Denies any chest pain fever chills. Patient's primary care physician ordered a CT scan which showed the above features mainly concerning for mass and was referred to the ER. Patient otherwise is not in distress. Patient has mild shortness of breath at exertion.  Assessment & Plan:   Principal Problem:   CAP (community acquired pneumonia) Active Problems:   COPD (chronic obstructive pulmonary disease) ([place])   Lung mass   Pneumonia  Principal Problem:  CAP (community acquired pneumonia) Active Problems:  COPD (chronic obstructive pulmonary disease) ([place])  Lung mass  Pneumonia    1. Pneumonia - suspect postobstructive given the lung mass. Patient has had multiple courses of antibiotics. At this time patient has been placed on vancomycin and Zosyn. Nasal swab for MRSA. Sputum culture. Narrow down antibiotics accordingly. Pulmonary input is appreciated - For  EBUS on Monday. 2. Lung mass - As per Pulmonary.   3. History of breast cancer in remission - presently CT scan showing new mass in the lung. 4. History of COPD - not actively wheezing. Continue home inhalers. 5. Tobacco abuse - tobacco cessation counseling requested.   DVT prophylaxis: SCDs. In anticipation of procedure. Code Status: Full code.  Family Communication: No family at the bedside.  Disposition Plan: Home.  Consults called: Pulmonary critical care.  Admission status: Observation. MedSurg   Consultants:   Pulmonary. For EBUS on Monday.   Antimicrobials:   On Vancomycin and Zosyn. Will need to be narrowed down.   Subjective: Nil new complaints. No fever or chills. Reported coarse voice. No chest pain. Seen alongside 2 daughters.  Objective: Filed Vitals:   11/06/15 2221 11/07/15 0415 11/07/15 0830 11/07/15 1432  BP: 132/72 99/40  123/55  Pulse: 90 73  84  Temp: 98.8 F (37.1 C) 99.1 F (37.3 C)  98.4 F (36.9 C)  TempSrc:  Oral  Oral  Resp:  18  18  Weight:      SpO2: 99% 92% 93% 94%    Intake/Output Summary (Last 24 hours) at 11/07/15 1807 Last data filed at 11/07/15 0900  Gross per 24 hour  Intake    240 ml  Output      0 ml  Net    240 ml   Filed Weights   11/06/15 2000  Weight: 111.585 kg (246 lb)    Examination:  General exam: Appears calm and comfortable  Respiratory system: Clear to auscultation. Respiratory effort normal. Cardiovascular system: S1 & S2 heard, RRR. No JVD, murmurs, rubs, gallops  or clicks. No pedal edema. Gastrointestinal system: Abdomen is nondistended, soft and nontender. No organomegaly or masses felt. Normal bowel sounds heard. Central nervous system: Alert and oriented. Moves all limbs. Extremities: No leg edema.   Data Reviewed: I have personally reviewed following labs and imaging studies  CBC:  Recent Labs Lab 11/06/15 1529 11/07/15 0140  WBC 14.1* 12.8*  HGB 14.5 13.5  HCT 43.9 40.2  MCV 93.8 94.6    PLT 347 983   Basic Metabolic Panel:  Recent Labs Lab 11/06/15 1529 11/07/15 0140  NA 137 137  K 4.1 3.4*  CL 103 102  CO2 27 27  GLUCOSE 95 151*  BUN 10 10  CREATININE 0.69 0.86  CALCIUM 9.5 9.1   GFR: CrCl cannot be calculated (Unknown ideal weight.). Liver Function Tests:  Recent Labs Lab 11/07/15 0140  AST 25  ALT 26  ALKPHOS 65  BILITOT 0.8  PROT 6.5  ALBUMIN 3.4*   No results for input(s): LIPASE, AMYLASE in the last 168 hours. No results for input(s): AMMONIA in the last 168 hours. Coagulation Profile: No results for input(s): INR, PROTIME in the last 168 hours. Cardiac Enzymes: No results for input(s): CKTOTAL, CKMB, CKMBINDEX, TROPONINI in the last 168 hours. BNP (last 3 results) No results for input(s): PROBNP in the last 8760 hours. HbA1C: No results for input(s): HGBA1C in the last 72 hours. CBG: No results for input(s): GLUCAP in the last 168 hours. Lipid Profile: No results for input(s): CHOL, HDL, LDLCALC, TRIG, CHOLHDL, LDLDIRECT in the last 72 hours. Thyroid Function Tests: No results for input(s): TSH, T4TOTAL, FREET4, T3FREE, THYROIDAB in the last 72 hours. Anemia Panel: No results for input(s): VITAMINB12, FOLATE, FERRITIN, TIBC, IRON, RETICCTPCT in the last 72 hours. Urine analysis: No results found for: COLORURINE, APPEARANCEUR, LABSPEC, PHURINE, GLUCOSEU, HGBUR, BILIRUBINUR, KETONESUR, PROTEINUR, UROBILINOGEN, NITRITE, LEUKOCYTESUR Sepsis Labs: '@LABRCNTIP'$ (procalcitonin:4,lacticidven:4)  )No results found for this or any previous visit (from the past 240 hour(s)).       Radiology Studies: Dg Chest 2 View  11/06/2015  CLINICAL DATA:  Recurrent pneumonia for almost 2 months. By report had a CT scan 2 days ago showing pneumonia and a new mass. EXAM: CHEST  2 VIEW COMPARISON:  Chest x-ray 08/18/2011. FINDINGS: Left upper lobe airspace opacity tracks into the hilum. Right lung is clear. The cardiopericardial silhouette is within normal  limits for size. The visualized bony structures of the thorax are intact. IMPRESSION: Left upper lobe airspace disease tracking centrally into the left hilum. Central obstructing neoplasm could have this appearance. Comparison to the recent CT scan is recommended. Electronically Signed   By: Misty Stanley M.D.   On: 11/06/2015 15:49        Scheduled Meds: . fluticasone  2 spray Each Nare Daily  . mometasone-formoterol  2 puff Inhalation BID  . montelukast  10 mg Oral QHS  . piperacillin-tazobactam (ZOSYN)  IV  3.375 g Intravenous Q8H  . potassium chloride  40 mEq Oral Once  . vancomycin  1,250 mg Intravenous Q12H   Continuous Infusions:    LOS: 0 days    Time spent: 30 Mins    Dana Allan, MD  [facility] Pager #: [phone] 7PM-7AM contact night coverage as above

## 2015-11-08 LAB — CBC WITH DIFFERENTIAL/PLATELET
Basophils Absolute: 0.1 10*3/uL (ref 0.0–0.1)
Basophils Relative: 1 %
Eosinophils Absolute: 0.4 10*3/uL (ref 0.0–0.7)
Eosinophils Relative: 4 %
HCT: 39.7 % (ref 36.0–46.0)
Hemoglobin: 13.1 g/dL (ref 12.0–15.0)
Lymphocytes Relative: 20 %
Lymphs Abs: 2.1 10*3/uL (ref 0.7–4.0)
MCH: 31.2 pg (ref 26.0–34.0)
MCHC: 33 g/dL (ref 30.0–36.0)
MCV: 94.5 fL (ref 78.0–100.0)
Monocytes Absolute: 0.8 10*3/uL (ref 0.1–1.0)
Monocytes Relative: 8 %
Neutro Abs: 7.1 10*3/uL (ref 1.7–7.7)
Neutrophils Relative %: 67 %
Platelets: 321 10*3/uL (ref 150–400)
RBC: 4.2 MIL/uL (ref 3.87–5.11)
RDW: 13.5 % (ref 11.5–15.5)
WBC: 10.4 10*3/uL (ref 4.0–10.5)

## 2015-11-08 LAB — MRSA PCR SCREENING: MRSA by PCR: NEGATIVE

## 2015-11-08 LAB — LEGIONELLA PNEUMOPHILA SEROGP 1 UR AG: L. pneumophila Serogp 1 Ur Ag: NEGATIVE

## 2015-11-08 LAB — EXPECTORATED SPUTUM ASSESSMENT W REFEX TO RESP CULTURE

## 2015-11-08 LAB — EXPECTORATED SPUTUM ASSESSMENT W GRAM STAIN, RFLX TO RESP C

## 2015-11-08 NOTE — Progress Notes (Signed)
PROGRESS NOTE    Sharon [name]  VQM:[id number] DOB: [birth year] DOA: 11/06/2015 PCP: [name], PA  Outpatient Specialists:   Brief Narrative: As per Dr. Clarene Duke: 60 y.o. female with breast cancer in remission, COPD and ongoing tobacco abuse was referred to the ER after patient's CT scan of the chest done on July 11 at an outside facility results of which are available in care every year showing the following features - "Infiltrative mass in the region of the left hilum, left mediastinum and left upper lobe difficult to delineate from adjacent areas of pneumonia measuring approximately 5.6 x 6.4 cm. This may be a primary lung cancer or, less likely, metastatic breast cancer. The mass narrows multiple pulmonary arteries and bronchi. Left upper lobe and lingular pneumonia. Mediastinal lymphadenopathy". Patient has been having productive cough for last 2-3 months. Patient had at least 5 courses of antibiotics despite which patient is still having productive cough. The last of which patient was on was Levaquin and Zithromax. Denies any chest pain fever chills. Patient's primary care physician ordered a CT scan which showed the above features mainly concerning for mass and was referred to the ER. Patient otherwise is not in distress. Patient has mild shortness of breath at exertion.  Assessment & Plan:   Principal Problem:   CAP (community acquired pneumonia) Active Problems:   COPD (chronic obstructive pulmonary disease) ([place])   Lung mass   Pneumonia  Principal Problem:  CAP (community acquired pneumonia) Active Problems:  COPD (chronic obstructive pulmonary disease) ([place])  Lung mass  Pneumonia    1. Pneumonia - suspect postobstructive given the lung mass. Patient has had multiple courses of antibiotics. At this time patient has been placed on vancomycin and Zosyn. Nasal swab for MRSA is still pending. Sputum culture. Narrow down antibiotics accordingly. Pulmonary input is  appreciated - For EBUS on Monday. 2. Lung mass - As per Pulmonary.   3. History of breast cancer in remission - presently CT scan showing new mass in the lung. 4. History of COPD - not actively wheezing. Continue home inhalers. 5. Tobacco abuse - tobacco cessation counseling requested.   DVT prophylaxis: SCDs. In anticipation of procedure. Code Status: Full code.  Family Communication: No family at the bedside.  Disposition Plan: Home.  Consults called: Pulmonary critical care.  Admission status: Observation. MedSurg   Consultants:   Pulmonary. For EBUS on Monday.   Antimicrobials:   On Vancomycin and Zosyn. Will need to be narrowed down.   Subjective: Nil new complaints. No fever or chills. Reported coarse voice. No chest pain. Seen alongside 2 daughters.  Objective: Filed Vitals:   11/07/15 1432 11/07/15 1956 11/08/15 0511 11/08/15 0914  BP: 123/55 115/60 93/46   Pulse: 84 79 72 89  Temp: 98.4 F (36.9 C) 98.3 F (36.8 C) 98.3 F (36.8 C)   TempSrc: Oral Oral Oral   Resp: '18 18 18 18  '$ Weight:      SpO2: 94% 94% 95% 95%    Intake/Output Summary (Last 24 hours) at 11/08/15 1005 Last data filed at 11/08/15 0900  Gross per 24 hour  Intake    480 ml  Output      0 ml  Net    480 ml   Filed Weights   11/06/15 2000  Weight: 111.585 kg (246 lb)    Examination:  General exam: Appears calm and comfortable  Respiratory system: Clear to auscultation. Respiratory effort normal. Cardiovascular system: S1 & S2 heard, RRR. No JVD,  murmurs, rubs, gallops or clicks. No pedal edema. Gastrointestinal system: Abdomen is nondistended, soft and nontender. No organomegaly or masses felt. Normal bowel sounds heard. Central nervous system: Alert and oriented. Moves all limbs. Extremities: No leg edema.   Data Reviewed: I have personally reviewed following labs and imaging studies  CBC:  Recent Labs Lab 11/06/15 1529 11/07/15 0140 11/08/15 0540  WBC 14.1* 12.8* 10.4    NEUTROABS  --   --  7.1  HGB 14.5 13.5 13.1  HCT 43.9 40.2 39.7  MCV 93.8 94.6 94.5  PLT 347 341 017   Basic Metabolic Panel:  Recent Labs Lab 11/06/15 1529 11/07/15 0140  NA 137 137  K 4.1 3.4*  CL 103 102  CO2 27 27  GLUCOSE 95 151*  BUN 10 10  CREATININE 0.69 0.86  CALCIUM 9.5 9.1   GFR: CrCl cannot be calculated (Unknown ideal weight.). Liver Function Tests:  Recent Labs Lab 11/07/15 0140  AST 25  ALT 26  ALKPHOS 65  BILITOT 0.8  PROT 6.5  ALBUMIN 3.4*   No results for input(s): LIPASE, AMYLASE in the last 168 hours. No results for input(s): AMMONIA in the last 168 hours. Coagulation Profile: No results for input(s): INR, PROTIME in the last 168 hours. Cardiac Enzymes: No results for input(s): CKTOTAL, CKMB, CKMBINDEX, TROPONINI in the last 168 hours. BNP (last 3 results) No results for input(s): PROBNP in the last 8760 hours. HbA1C: No results for input(s): HGBA1C in the last 72 hours. CBG: No results for input(s): GLUCAP in the last 168 hours. Lipid Profile: No results for input(s): CHOL, HDL, LDLCALC, TRIG, CHOLHDL, LDLDIRECT in the last 72 hours. Thyroid Function Tests: No results for input(s): TSH, T4TOTAL, FREET4, T3FREE, THYROIDAB in the last 72 hours. Anemia Panel: No results for input(s): VITAMINB12, FOLATE, FERRITIN, TIBC, IRON, RETICCTPCT in the last 72 hours. Urine analysis: No results found for: COLORURINE, APPEARANCEUR, LABSPEC, PHURINE, GLUCOSEU, HGBUR, BILIRUBINUR, KETONESUR, PROTEINUR, UROBILINOGEN, NITRITE, LEUKOCYTESUR Sepsis Labs: '@LABRCNTIP'$ (procalcitonin:4,lacticidven:4)  )No results found for this or any previous visit (from the past 240 hour(s)).       Radiology Studies: Dg Chest 2 View  11/06/2015  CLINICAL DATA:  Recurrent pneumonia for almost 2 months. By report had a CT scan 2 days ago showing pneumonia and a new mass. EXAM: CHEST  2 VIEW COMPARISON:  Chest x-ray 08/18/2011. FINDINGS: Left upper lobe airspace opacity  tracks into the hilum. Right lung is clear. The cardiopericardial silhouette is within normal limits for size. The visualized bony structures of the thorax are intact. IMPRESSION: Left upper lobe airspace disease tracking centrally into the left hilum. Central obstructing neoplasm could have this appearance. Comparison to the recent CT scan is recommended. Electronically Signed   By: Misty Stanley M.D.   On: 11/06/2015 15:49        Scheduled Meds: . fluticasone  2 spray Each Nare Daily  . mometasone-formoterol  2 puff Inhalation BID  . montelukast  10 mg Oral QHS  . piperacillin-tazobactam (ZOSYN)  IV  3.375 g Intravenous Q8H  . potassium chloride  40 mEq Oral Once  . vancomycin  1,250 mg Intravenous Q12H   Continuous Infusions:    LOS: 1 day    Time spent: 30 Mins    Dana Allan, MD  [facility] Pager #: [phone] 7PM-7AM contact night coverage as above

## 2015-11-09 LAB — PROCALCITONIN: Procalcitonin: <0.1 ng/mL

## 2015-11-09 NOTE — Progress Notes (Signed)
Pharmacy Antibiotic Note  Sharon [name] is a 60 y.o. female admitted on 11/06/2015 with pneumonia.  Pharmacy has been consulted for vanc/zosyn dosing. Today is abx day #4.  WBC WNL, afebrile, CCM rec to consider stopping/narrowing abx on 7/14.  Pt with lung mass  Plan: consider stopping/narrowing abx as rec by CCM on 7/14 EI Zosyn 3.375 grams IV every 8 hours  Vancomycin 1250 mg Q 12 hours  - no levels as may be stopped soon  Weight: 246 lb (111.585 kg)  Temp (24hrs), Avg:98.2 F (36.8 C), Min:98.2 F (36.8 C), Max:98.2 F (36.8 C)   Recent Labs Lab 11/06/15 1529 11/07/15 0140 11/07/15 0652 11/07/15 0956 11/08/15 0540  WBC 14.1* 12.8*  --   --  10.4  CREATININE 0.69 0.86  --   --   --   LATICACIDVEN  --   --  2.4* 1.4  --     CrCl cannot be calculated (Unknown ideal weight.).    No Known Allergies  Antimicrobials this admission: 7/13 Zosyn >> 7/13 vancomycin >>  Dose adjustments this admission: none  Microbiology results: 7/15 sputum>>rare WBC, few GN coccobacilli 7/15 MRSA PCR neg Strep pneumo neg HIV neg  Thank you for allowing pharmacy to be a part of this patient's care.  Eudelia Bunch, Pharm.D. [phone] 11/09/2015 1:32 PM

## 2015-11-09 NOTE — Progress Notes (Signed)
PROGRESS NOTE    CRISTABEL BICKNELL  YCX:[id number] DOB: 1955-10-24 DOA: 11/06/2015 PCP: [name], PA  Outpatient Specialists:   Brief Narrative: As per Dr. Clarene Duke: 60 y.o. female with breast cancer in remission, COPD and ongoing tobacco abuse was referred to the ER after patient's CT scan of the chest done on July 11 at an outside facility results of which are available in care every year showing the following features - "Infiltrative mass in the region of the left hilum, left mediastinum and left upper lobe difficult to delineate from adjacent areas of pneumonia measuring approximately 5.6 x 6.4 cm. This may be a primary lung cancer or, less likely, metastatic breast cancer. The mass narrows multiple pulmonary arteries and bronchi. Left upper lobe and lingular pneumonia. Mediastinal lymphadenopathy". Patient has been having productive cough for last 2-3 months. Patient had at least 5 courses of antibiotics despite which patient is still having productive cough. The last of which patient was on was Levaquin and Zithromax. Denies any chest pain fever chills. Patient's primary care physician ordered a CT scan which showed the above features mainly concerning for mass and was referred to the ER. Patient otherwise is not in distress. Patient has mild shortness of breath at exertion.  Assessment & Plan:   Principal Problem:   CAP (community acquired pneumonia) Active Problems:   COPD (chronic obstructive pulmonary disease) ([place])   Lung mass   Pneumonia  Principal Problem:  CAP (community acquired pneumonia) Active Problems:  COPD (chronic obstructive pulmonary disease) ([place])  Lung mass  Pneumonia    1. Pneumonia - suspect postobstructive given the lung mass. Patient has had multiple courses of antibiotics. At this time patient has been placed on vancomycin and Zosyn. Nasal swab for MRSA is negative. Vancomycin is discontinued. Follow sputum culture. Narrow down antibiotics  accordingly. Pulmonary input is appreciated - For EBUS on Monday. NPO after midnight 2. Lung mass - As per Pulmonary.   3. History of breast cancer in remission - presently CT scan showing new mass in the lung. 4. History of COPD - not actively wheezing. Continue home inhalers. 5. Tobacco abuse - tobacco cessation counseling requested.   DVT prophylaxis: SCDs. In anticipation of procedure. Code Status: Full code.  Family Communication: No family at the bedside.  Disposition Plan: Home.  Consults called: Pulmonary critical care.  Admission status: Observation. MedSurg   Consultants:   Pulmonary. For EBUS on Monday.   Antimicrobials:   On Vancomycin and Zosyn. Will need to be narrowed down.   Subjective: Nil new complaints. No fever or chills. Reported coarse voice. No chest pain. Seen alongside 2 daughters.  Objective: Filed Vitals:   11/08/15 2027 11/08/15 2036 11/09/15 0435 11/09/15 0824  BP: 109/55  122/57   Pulse: 79  72 78  Temp: 98.2 F (36.8 C)  98.2 F (36.8 C)   TempSrc: Oral  Oral   Resp: '18  18 18  '$ Weight:      SpO2: 95% 97% 94% 96%    Intake/Output Summary (Last 24 hours) at 11/09/15 1029 Last data filed at 11/08/15 1700  Gross per 24 hour  Intake    480 ml  Output      0 ml  Net    480 ml   Filed Weights   11/06/15 2000  Weight: 111.585 kg (246 lb)    Examination:  General exam: Appears calm and comfortable  Respiratory system: Clear to auscultation. Respiratory effort normal. Cardiovascular system: S1 & S2 heard,  RRR. No JVD, murmurs, rubs, gallops or clicks. No pedal edema. Gastrointestinal system: Abdomen is nondistended, soft and nontender. No organomegaly or masses felt. Normal bowel sounds heard. Central nervous system: Alert and oriented. Moves all limbs. Extremities: No leg edema.   Data Reviewed: I have personally reviewed following labs and imaging studies  CBC:  Recent Labs Lab 11/06/15 1529 11/07/15 0140 11/08/15 0540  WBC  14.1* 12.8* 10.4  NEUTROABS  --   --  7.1  HGB 14.5 13.5 13.1  HCT 43.9 40.2 39.7  MCV 93.8 94.6 94.5  PLT 347 341 124   Basic Metabolic Panel:  Recent Labs Lab 11/06/15 1529 11/07/15 0140  NA 137 137  K 4.1 3.4*  CL 103 102  CO2 27 27  GLUCOSE 95 151*  BUN 10 10  CREATININE 0.69 0.86  CALCIUM 9.5 9.1   GFR: CrCl cannot be calculated (Unknown ideal weight.). Liver Function Tests:  Recent Labs Lab 11/07/15 0140  AST 25  ALT 26  ALKPHOS 65  BILITOT 0.8  PROT 6.5  ALBUMIN 3.4*   No results for input(s): LIPASE, AMYLASE in the last 168 hours. No results for input(s): AMMONIA in the last 168 hours. Coagulation Profile: No results for input(s): INR, PROTIME in the last 168 hours. Cardiac Enzymes: No results for input(s): CKTOTAL, CKMB, CKMBINDEX, TROPONINI in the last 168 hours. BNP (last 3 results) No results for input(s): PROBNP in the last 8760 hours. HbA1C: No results for input(s): HGBA1C in the last 72 hours. CBG: No results for input(s): GLUCAP in the last 168 hours. Lipid Profile: No results for input(s): CHOL, HDL, LDLCALC, TRIG, CHOLHDL, LDLDIRECT in the last 72 hours. Thyroid Function Tests: No results for input(s): TSH, T4TOTAL, FREET4, T3FREE, THYROIDAB in the last 72 hours. Anemia Panel: No results for input(s): VITAMINB12, FOLATE, FERRITIN, TIBC, IRON, RETICCTPCT in the last 72 hours. Urine analysis: No results found for: COLORURINE, APPEARANCEUR, LABSPEC, PHURINE, GLUCOSEU, HGBUR, BILIRUBINUR, KETONESUR, PROTEINUR, UROBILINOGEN, NITRITE, LEUKOCYTESUR Sepsis Labs: '@LABRCNTIP'$ (procalcitonin:4,lacticidven:4)  ) Recent Results (from the past 240 hour(s))  MRSA PCR Screening     Status: None   Collection Time: 11/08/15  9:18 AM  Result Value Ref Range Status   MRSA by PCR NEGATIVE NEGATIVE Final    Comment:        The GeneXpert MRSA Assay (FDA approved for NASAL specimens only), is one component of a comprehensive MRSA colonization surveillance  program. It is not intended to diagnose MRSA infection nor to guide or monitor treatment for MRSA infections.   Culture, expectorated sputum-assessment     Status: None   Collection Time: 11/08/15 10:14 AM  Result Value Ref Range Status   Specimen Description SPUTUM  Final   Special Requests NONE  Final   Sputum evaluation   Final    THIS SPECIMEN IS ACCEPTABLE. RESPIRATORY CULTURE REPORT TO FOLLOW.   Report Status 11/08/2015 FINAL  Final  Culture, respiratory (NON-Expectorated)     Status: None (Preliminary result)   Collection Time: 11/08/15  1:49 PM  Result Value Ref Range Status   Specimen Description SPUTUM  Final   Special Requests NONE  Final   Gram Stain   Final    RARE WBC PRESENT, PREDOMINANTLY PMN FEW GRAM NEGATIVE COCCOBACILLI    Culture PENDING  Incomplete   Report Status PENDING  Incomplete         Radiology Studies: No results found.      Scheduled Meds: . fluticasone  2 spray Each Nare Daily  .  mometasone-formoterol  2 puff Inhalation BID  . montelukast  10 mg Oral QHS  . piperacillin-tazobactam (ZOSYN)  IV  3.375 g Intravenous Q8H  . potassium chloride  40 mEq Oral Once   Continuous Infusions:    LOS: 2 days    Time spent: 30 Mins    Dana Allan, MD  [facility] Pager #: [phone] 7PM-7AM contact night coverage as above

## 2015-11-10 ENCOUNTER — Inpatient Hospital Stay (HOSPITAL_COMMUNITY): Payer: 59 | Admitting: Certified Registered Nurse Anesthetist

## 2015-11-10 ENCOUNTER — Encounter (HOSPITAL_COMMUNITY): Payer: Self-pay | Admitting: Certified Registered Nurse Anesthetist

## 2015-11-10 ENCOUNTER — Encounter (HOSPITAL_COMMUNITY)
Admission: EM | Disposition: A | Discharge status: Home / Self Care | Payer: Self-pay | Source: Home / Self Care | Attending: Internal Medicine

## 2015-11-10 ENCOUNTER — Inpatient Hospital Stay (HOSPITAL_COMMUNITY): Payer: 59

## 2015-11-10 DIAGNOSIS — R599 Enlarged lymph nodes, unspecified: Secondary | ICD-10-CM

## 2015-11-10 DIAGNOSIS — J9601 Acute respiratory failure with hypoxia: Secondary | ICD-10-CM

## 2015-11-10 DIAGNOSIS — R590 Localized enlarged lymph nodes: Secondary | ICD-10-CM | POA: Insufficient documentation

## 2015-11-10 DIAGNOSIS — J449 Chronic obstructive pulmonary disease, unspecified: Secondary | ICD-10-CM

## 2015-11-10 HISTORY — PX: VIDEO BRONCHOSCOPY WITH ENDOBRONCHIAL ULTRASOUND: SHX6177

## 2015-11-10 LAB — CULTURE, RESPIRATORY

## 2015-11-10 LAB — MRSA PCR SCREENING: MRSA by PCR: NEGATIVE

## 2015-11-10 LAB — CULTURE, RESPIRATORY W GRAM STAIN: Culture: NORMAL

## 2015-11-10 SURGERY — BRONCHOSCOPY, WITH EBUS
Anesthesia: General

## 2015-11-10 MED ORDER — FENTANYL CITRATE (PF) 250 MCG/5ML IJ SOLN
INTRAMUSCULAR | Status: DC | PRN
  Administered 2015-11-10: 100 ug via INTRAVENOUS
  Administered 2015-11-10: 50 ug via INTRAVENOUS

## 2015-11-10 MED ORDER — PROPOFOL 10 MG/ML IV BOLUS
INTRAVENOUS | Status: DC | PRN
  Administered 2015-11-10: 160 mg via INTRAVENOUS

## 2015-11-10 MED ORDER — OXYCODONE HCL 5 MG PO TABS: 5.0000 mg | ORAL_TABLET | Freq: Once | ORAL | Status: DC | PRN

## 2015-11-10 MED ORDER — SODIUM CHLORIDE 0.9 % IV SOLN: Freq: Once | INTRAVENOUS | Status: DC

## 2015-11-10 MED ORDER — ROCURONIUM BROMIDE 10 MG/ML (PF) SYRINGE
PREFILLED_SYRINGE | INTRAVENOUS | Status: DC | PRN
  Administered 2015-11-10: 50 mg via INTRAVENOUS

## 2015-11-10 MED ORDER — ONDANSETRON HCL 4 MG/2ML IJ SOLN
INTRAMUSCULAR | Status: DC | PRN
  Administered 2015-11-10: 4 mg via INTRAVENOUS

## 2015-11-10 MED ORDER — ONDANSETRON HCL 4 MG/2ML IJ SOLN: 4.0000 mg | Freq: Once | INTRAMUSCULAR | Status: DC | PRN

## 2015-11-10 MED ORDER — ACETAMINOPHEN 325 MG PO TABS: 650.0000 mg | ORAL_TABLET | Freq: Four times a day (QID) | ORAL | Status: DC | PRN

## 2015-11-10 MED ORDER — FENTANYL CITRATE (PF) 250 MCG/5ML IJ SOLN
INTRAMUSCULAR | Status: AC
  Filled 2015-11-10: qty 5

## 2015-11-10 MED ORDER — FENTANYL CITRATE (PF) 100 MCG/2ML IJ SOLN: 25.0000 ug | INTRAMUSCULAR | Status: DC | PRN

## 2015-11-10 MED ORDER — MIDAZOLAM HCL 5 MG/5ML IJ SOLN
INTRAMUSCULAR | Status: DC | PRN
  Administered 2015-11-10 (×2): 1 mg via INTRAVENOUS

## 2015-11-10 MED ORDER — SUGAMMADEX SODIUM 200 MG/2ML IV SOLN
INTRAVENOUS | Status: DC | PRN
Start: 2015-11-10 — End: 2015-11-10
  Administered 2015-11-10: 100 mg via INTRAVENOUS

## 2015-11-10 MED ORDER — LACTATED RINGERS IV SOLN
INTRAVENOUS | Status: DC
  Administered 2015-11-10: 12:00:00 via INTRAVENOUS

## 2015-11-10 MED ORDER — LIDOCAINE HCL 1 % IJ SOLN: INTRAMUSCULAR | Status: DC | PRN

## 2015-11-10 MED ORDER — LIDOCAINE HCL (PF) 1 % IJ SOLN
INTRAMUSCULAR | Status: AC
  Filled 2015-11-10: qty 30

## 2015-11-10 MED ORDER — MIDAZOLAM HCL 2 MG/2ML IJ SOLN
INTRAMUSCULAR | Status: AC
  Filled 2015-11-10: qty 2

## 2015-11-10 MED ORDER — OXYCODONE HCL 5 MG/5ML PO SOLN: 5.0000 mg | Freq: Once | ORAL | Status: DC | PRN

## 2015-11-10 MED ORDER — 0.9 % SODIUM CHLORIDE (POUR BTL) OPTIME
TOPICAL | Status: DC | PRN
  Administered 2015-11-10: 1000 mL

## 2015-11-10 MED ORDER — LIDOCAINE 2% (20 MG/ML) 5 ML SYRINGE
INTRAMUSCULAR | Status: DC | PRN
  Administered 2015-11-10: 100 mg via INTRAVENOUS

## 2015-11-10 SURGICAL SUPPLY — 30 items
BRUSH CYTOL CELLEBRITY 1.5X140 (MISCELLANEOUS) IMPLANT
CANISTER SUCTION 2500CC (MISCELLANEOUS) ×3 IMPLANT
CANNULA VESSEL 3MM 2 BLNT TIP (CANNULA) ×3 IMPLANT
CONT SPEC 4OZ CLIKSEAL STRL BL (MISCELLANEOUS) ×3 IMPLANT
COVER DOME SNAP 22 D (MISCELLANEOUS) ×3 IMPLANT
COVER TABLE BACK 60X90 (DRAPES) ×3 IMPLANT
FORCEPS BIOP RJ4 1.8 (CUTTING FORCEPS) IMPLANT
GAUZE SPONGE 4X4 12PLY STRL (GAUZE/BANDAGES/DRESSINGS) ×3 IMPLANT
GOWN STRL REUS W/ TWL LRG LVL3 (GOWN DISPOSABLE) ×1 IMPLANT
GOWN STRL REUS W/TWL LRG LVL3 (GOWN DISPOSABLE) ×2
KIT CLEAN ENDO COMPLIANCE (KITS) ×6 IMPLANT
KIT ROOM TURNOVER OR (KITS) ×3 IMPLANT
MARKER SKIN DUAL TIP RULER LAB (MISCELLANEOUS) ×3 IMPLANT
NEEDLE BIOPSY TRANSBRONCH 21G (NEEDLE) IMPLANT
NEEDLE EBUS SONO TIP PENTAX (NEEDLE) ×3 IMPLANT
NS IRRIG 1000ML POUR BTL (IV SOLUTION) ×3 IMPLANT
PAD ARMBOARD 7.5X6 YLW CONV (MISCELLANEOUS) ×6 IMPLANT
STOPCOCK 4 WAY LG BORE MALE ST (IV SETS) ×3 IMPLANT
SURGILUBE 2OZ TUBE FLIPTOP (MISCELLANEOUS) ×3 IMPLANT
SYR 20CC LL (SYRINGE) ×3 IMPLANT
SYR 20ML ECCENTRIC (SYRINGE) ×3 IMPLANT
SYR 50ML LL SCALE MARK (SYRINGE) IMPLANT
SYR 5ML LL (SYRINGE) ×3 IMPLANT
SYR 5ML LUER SLIP (SYRINGE) ×6 IMPLANT
SYRINGE 10CC LL (SYRINGE) ×6 IMPLANT
TOWEL OR 17X24 6PK STRL BLUE (TOWEL DISPOSABLE) ×3 IMPLANT
TRAP SPECIMEN MUCOUS 40CC (MISCELLANEOUS) IMPLANT
TUBE CONNECTING 20'X1/4 (TUBING) ×1
TUBE CONNECTING 20X1/4 (TUBING) ×2 IMPLANT
WATER STERILE IRR 1000ML POUR (IV SOLUTION) ×3 IMPLANT

## 2015-11-10 NOTE — Consult Note (Signed)
Name: Sharon [name] MRN: [id number] DOB: 09/30/1955    ADMISSION DATE:  11/06/2015 CONSULTATION DATE:  11/10/2015  REFERRING MD :  Dana Allan, M.D. / [facility]  CHIEF COMPLAINT:  Lung mass with mediastinal adenopathy  STUDIES:  CT CHEST: Personally reviewed by me. Left hilar mass/opacity. Pathologically enlarged mediastinal lymph nodes at level 4R, 4L, & 7. No pleural effusion or thickening. No pericardial effusion.  HISTORY OF PRESENT ILLNESS:  60 year old female presenting with voice changes & nonproductive cough. Currently on treatment with broad-spectrum antibiotics for suspected postobstructive pneumonia. Denies any chest pain or pressure. Denies any dyspnea. Patient denies any subjective fever or chills. He is having night sweats. No appreciated cervical or supraclavicular lymphadenopathy. Denies any headache, vision changes, or focal weakness, numbness, or tingling. Denies any melena or hematochezia. Last colonoscopy was some years ago but identified only diverticulosis. Denies any dysphagia or odynophagia.  PAST MEDICAL HISTORY :  Past Medical History  Diagnosis Date  . Menopause   . COPD (chronic obstructive pulmonary disease) ([place])   . Elevated blood pressure   . Vitamin D deficiency   . Pre-diabetes   . CAP (community acquired pneumonia) 11/06/2015  . Cancer of right breast ([place]) 2009  . Lung mass dx'd 10/2015  . Hoarseness of voice     "for the last 2 months" (11/06/2015)    PAST SURGICAL HISTORY: Past Surgical History  Procedure Laterality Date  . Vesicovaginal fistula closure w/ tah  2000  . Uterine fibroid surgery  2000  . Breast biopsy Right 2009  . Breast lumpectomy Right 2009  . Vaginal hysterectomy  2000    Prior to Admission medications   Medication Sig Start Date End Date Taking? Authorizing Provider  albuterol (PROVENTIL HFA;VENTOLIN HFA) 108 (90 Base) MCG/ACT inhaler Inhale 1 puff into the lungs every 6 (six) hours as needed for wheezing or shortness of  breath.   Yes Historical Provider, MD  budesonide-formoterol (SYMBICORT) 160-4.5 MCG/ACT inhaler Inhale 2 puffs into the lungs 2 (two) times daily.   Yes Historical Provider, MD  fluticasone (VERAMYST) 27.5 MCG/SPRAY nasal spray Place 2 sprays into the nose daily.   Yes Historical Provider, MD  montelukast (SINGULAIR) 10 MG tablet Take 10 mg by mouth at bedtime.   Yes Historical Provider, MD  Fluticasone-Salmeterol (ADVAIR DISKUS) 250-50 MCG/DOSE AEPB Inhale 1 puff into the lungs 2 (two) times daily. 11/02/11 11/01/12  Tanda Rockers, MD  letrozole [facility]) 2.5 MG tablet Take 1 tablet (2.5 mg total) by mouth daily. Patient not taking: Reported on 11/06/2015 09/25/12   Chauncey Cruel, MD   No Known Allergies  FAMILY HISTORY:  Family History  Problem Relation Age of Onset  . Adopted: Yes   SOCIAL HISTORY: Social History   Social History  . Marital Status: Divorced    Spouse Name: N/A  . Number of Children: 3  . Years of Education: N/A   Occupational History  . Lawncare     Social History Main Topics  . Smoking status: Current Every Day Smoker -- 0.50 packs/day for 46 years    Types: Cigarettes  . Smokeless tobacco: Never Used  . Alcohol Use: No  . Drug Use: Yes    Special: Marijuana     Comment: 11/06/2015 "1-2 times/week when I do smoke; none lately"  . Sexual Activity: Not Asked   Other Topics Concern  . None   Social History Narrative    REVIEW OF SYSTEMS:  Denies any rashes or bruising. No hematuria or  dysuria. A pertinent 14 point review of systems is negative except as per the history of presenting illness.   VITAL SIGNS: Temp:  [98 F (36.7 C)-98.1 F (36.7 C)] 98 F (36.7 C) (07/17 0454) Pulse Rate:  [69-78] 69 (07/17 0454) Resp:  [18] 18 (07/17 0454) BP: (101-105)/(57-66) 101/57 mmHg (07/17 0454) SpO2:  [95 %-97 %] 97 % (07/17 0454)  PHYSICAL EXAMINATION: General:  Awake. Alert. No acute distress. Daughter at bedside.  Integument:  Warm & dry. No rash on  exposed skin. No bruising. Lymphatics:  No appreciated cervical or supraclavicular lymphadenoapthy. HEENT:  Moist mucus membranes. No oral ulcers. No scleral injection or icterus Cardiovascular:  Regular rate. No edema. No appreciable JVD.  Pulmonary:  Good aeration & clear to auscultation bilaterally. Symmetric chest wall expansion. No accessory muscle use on room air. Abdomen: Soft. Normal bowel sounds. Protuberant. Grossly nontender. Musculoskeletal:  Normal bulk and tone. Hand grip strength 5/5 bilaterally. No joint deformity or effusion appreciated. Neurological:  CN 2-12 grossly in tact. No meningismus. Moving all 4 extremities equally. Symmetric brachioradialis deep tendon reflexes. Psychiatric:  Mood and affect congruent. Speech normal rhythm, rate & tone.    Recent Labs Lab 11/06/15 1529 11/07/15 0140  NA 137 137  K 4.1 3.4*  CL 103 102  CO2 27 27  BUN 10 10  CREATININE 0.69 0.86  GLUCOSE 95 151*    Recent Labs Lab 11/06/15 1529 11/07/15 0140 11/08/15 0540  HGB 14.5 13.5 13.1  HCT 43.9 40.2 39.7  WBC 14.1* 12.8* 10.4  PLT 347 341 321   No results found.  ASSESSMENT / PLAN:   60 year old female with left hilar opacity and mediastinal adenopathy highly suspicious for malignancy. Discussed the risks and benefits of bronchoscopy with airway examination and endobronchial ultrasound exam with possible biopsy including medication allergy, vocal cord injury, bleeding, infection, pneumothorax, and potentially death. The patient is willing to undergo the procedure. She is scheduled for this procedure around noon today in the operating room we will attempt with conscious sedation first as the patient is currently on room air and plan to transition to intubation if medically necessary. Await anesthesia preoperative evaluation. Discussed the case and procedure extensively with the patient's family and explained that a definitive diagnosis cannot be provided today while awaiting  further staining and testing by pathologist. However, we will contact the patient once we have the final pathology report. I also discussed the potential that this biopsy may not yield a diagnosis and further biopsy such as mediastinal endoscopy may be required. Patient confirms that she does not take any blood thinners or antiplatelet agents outpatient.  Sonia Baller Ashok Cordia, M.D. [facility] Pager:  [phone] After 3pm or if no response, call [phone] 11/10/2015, 7:36 AM

## 2015-11-10 NOTE — Discharge Instructions (Signed)
Amoxicillin; Clavulanic Acid extended-release tablets What is this medicine? AMOXICILLIN; CLAVULANIC ACID (a mox i SILL in; KLAV yoo lan ic AS id) is a penicillin antibiotic. It is used to treat certain kinds of bacterial infections. It will not work for colds, flu, or other viral infections. This medicine may be used for other purposes; ask your health care provider or pharmacist if you have questions. What should I tell my health care provider before I take this medicine? They need to know if you have any of these conditions: -bowel disease, like colitis -kidney disease -liver disease -mononucleosis -an unusual or allergic reaction to amoxicillin, penicillin, cephalosporin, other antibiotics, clavulanic acid, other medicines, foods, dyes, or preservatives -pregnant or trying to get pregnant -breast-feeding How should I use this medicine? Take this medicine by mouth with a full glass of water. Follow the directions on the prescription label. Take at the start of a meal. Do not crush or chew. You may cut this medicine in half at the score line for easier swallowing. Take your medicine at regular intervals. Do not take your medicine more often than directed. Take all of your medicine as directed even if you think you are better. Do not skip doses or stop your medicine early. Contact your pediatrician or health care professional regarding the use of this medicine in children. This medicine has been used in children as young as 17 years of age. Overdosage: If you think you have taken too much of this medicine contact a poison control center or emergency room at once. NOTE: This medicine is only for you. Do not share this medicine with others. What if I miss a dose? If you miss a dose, take it as soon as you can. If it is almost time for your next dose, take only that dose. Do not take double or extra doses. What may interact with this medicine? -allopurinol -anticoagulants -birth control  pills -methotrexate -probenecid This list may not describe all possible interactions. Give your health care provider a list of all the medicines, herbs, non-prescription drugs, or dietary supplements you use. Also tell them if you smoke, drink alcohol, or use illegal drugs. Some items may interact with your medicine. What should I watch for while using this medicine? Tell your doctor or health care professional if your symptoms do not improve. Do not treat diarrhea with over the counter products. Contact your doctor if you have diarrhea that lasts more than 2 days or if it is severe and watery. If you have diabetes, you may get a false-positive result for sugar in your urine. Check with your doctor or health care professional. Birth control pills may not work properly while you are taking this medicine. Talk to your doctor about using an extra method of birth control. What side effects may I notice from receiving this medicine? Side effects that you should report to your doctor or health care professional as soon as possible: -allergic reactions like skin rash, itching or hives, swelling of the face, lips, or tongue -breathing problems -dark urine -fever or chills, sore throat -redness, blistering, peeling or loosening of the skin, including inside the mouth -seizures -trouble passing urine or change in the amount of urine -unusual bleeding, bruising -unusually weak or tired -white patches or sores in the mouth or throat Side effects that usually do not require medical attention (report to your doctor or health care professional if they continue or are bothersome): -diarrhea -dizziness -headache -nausea, vomiting -stomach upset -vaginal or anal irritation  This list may not describe all possible side effects. Call your doctor for medical advice about side effects. You may report side effects to FDA at 1-800-FDA-1088. Where should I keep my medicine? Keep out of the reach of  children. Store at room temperature below 25 degrees C (77 degrees F). Keep container tightly closed. Throw away any unused medicine after the expiration date. NOTE: This sheet is a summary. It may not cover all possible information. If you have questions about this medicine, talk to your doctor, pharmacist, or health care provider.    2016, Elsevier/Gold Standard. (2007-07-04 14:32:45)

## 2015-11-10 NOTE — Op Note (Signed)
Video Bronchoscopy Procedure Note  Pre-Procedure Diagnoses: 1.  Left lung mass 2.  Mediastinal adenopathy  Procedures Performed: 1. Bronchoscopy with Airway Inspection 2. Endobronchial Ultrasound with Needle Aspiration  Consent:  Informed consent was obtained from the patient after discussing the risks and benefits of the procedure including bleeding, infection, pneumothorax, medication allergy, vocal chord injury, and potentially death.  Sedation:  Administered by anesthesia. Please refer to their documentation.  Description of Procedure: Patient was brought back to the operating room.  A time out was performed to identify the correct patient and procedure. Patient was endotracheally intubated by anesthesia. Sedation administered by anesthesia.  Flexible bronchoscope was then inserted into the endotracheal tube. An airway inspeciton was performed finding a small amount of thick, clear secretions. Nearly 100% obstruction of the bronchus leading to the anterior segment of the left upper lobe as well as the apical posterior left upper lobe. Mucosa appeared markedly erythematous but only slightly nodular indicating predominantly extrinsic compression but with some suggestion of invasion. The flexible bronchoscope was then removed from the patient's airways after suctioning of the remaining secretions.  The endobronchial ultrasound scope was then inserted into the endotracheal tube.  A lymph node at level 4R but was too small and adjacent pulmonary vasculature making it unable to safely biopsy. An additional lymph node was identified at level 4L with endobronchial ultrasound.  Under direct visualization with the ultrasound a total of 3 passes were performed at level 4L with a 22 gauge needle.  Hemostasis was directly visualized. The endobronchial ultrasound scope was then repositioned to level 7 visualizing an enlarged lymph node measuring approximately 2.5 cm.  What appeared to be a fatty hilum was  identified at the center of the lymph node and Doppler was applied to rule out any blood vessels that would prohibit biopsy. Utilizing a 22-gauge needle under direct ultrasound visualization a total of 4 passes were performed at level 7. Hemostasis was again directly visualized. The remaining secretions were suctioned from the patient's airways and the endobronchial ultrasound scope was removed.    Blood Loss:  Less than 10cc.  Complications:  None.  EBUS Procedure: Level 4R:  0.5 cm lymph node / No biopsy performed  Level 4L:  0.5-1.0 cm lymph node / biopsy performed / ROSE / passes 1 & 2 for cytology & cell block / pass 3 for cell block only Level 7:  2.5 cm lymph node /biopsy performed / passes 4 & 5 for cytology & cell block / passes 6 & 7 for cell block only  Post Procedure Stat Portable CXR:  Ordered and pending.  Post Procedure Instructions: Personally spoke with the patient's daughter relaying the preliminary results of the procedure.  Preliminary result positive for malignancy. Awaiting final pathology report.  1. Patient to remain nothing by mouth for 1 hour post procedure until she passes bedside swallow evaluation by nurse then may resume previous diet. 2. I will contact the patient with the final results of her pathology. 3. The patient should seek immediate medical attention for any persistent hemoptysis, chest discomfort, or difficulty breathing. She should contact my office for any persistent fever or cough productive of a purulent phlegm.  4. Recommend a total 14 day course of antibiotics and transitioning to Augmentin to complete this course. 5. The patient should be scheduled for a follow-up in our clinic within 2 weeks of discharge. 6. She should also have a PET CT scan ordered to complete staging for this non-small cell malignancy seen on  preliminary exam today. 7. Patient may be discharged to home when ok with primary service.  Sonia Baller Ashok Cordia, M.D. [facility] Pulmonary  & Critical Care Pager:  [phone] After 3pm or if no response, call [phone] 2:07 PM 11/10/2015

## 2015-11-10 NOTE — Anesthesia Procedure Notes (Addendum)
Procedure Name: Intubation Date/Time: 11/10/2015 1:06 PM Performed by: Merdis Delay Pre-anesthesia Checklist: Patient identified, Emergency Drugs available, Suction available and Timeout performed Patient Re-evaluated:Patient Re-evaluated prior to inductionOxygen Delivery Method: Circle system utilized Preoxygenation: Pre-oxygenation with 100% oxygen Intubation Type: IV induction Ventilation: Mask ventilation without difficulty and Oral airway inserted - appropriate to patient size Laryngoscope Size: Mac and 3 Grade View: Grade I Tube type: Oral Tube size: 8.5 mm Number of attempts: 2 Airway Equipment and Method: Stylet Placement Confirmation: positive ETCO2,  CO2 detector,  breath sounds checked- equal and bilateral and ETT inserted through vocal cords under direct vision Secured at: 22 cm Tube secured with: Tape Dental Injury: Teeth and Oropharynx as per pre-operative assessment  Comments: Grade 1 view with cricoid pressure. Accidental esoph intub--> quickly identified.  Dl again by Joslin- +etco2. Stomach then decompressed.

## 2015-11-10 NOTE — Anesthesia Preprocedure Evaluation (Addendum)
Anesthesia Evaluation  Patient identified by MRN, date of birth, ID band Patient awake    Reviewed: Allergy & Precautions, NPO status , Patient's Chart, lab work & pertinent test results  Airway Mallampati: II  TM Distance: >3 FB Neck ROM: Full    Dental  (+) Teeth Intact, Dental Advisory Given   Pulmonary Current Smoker,    breath sounds clear to auscultation       Cardiovascular  Rhythm:Regular Rate:Normal     Neuro/Psych    GI/Hepatic   Endo/Other    Renal/GU      Musculoskeletal   Abdominal   Peds  Hematology   Anesthesia Other Findings   Reproductive/Obstetrics                            Anesthesia Physical Anesthesia Plan  ASA: III  Anesthesia Plan: General   Post-op Pain Management:    Induction: Intravenous  Airway Management Planned: Oral ETT  Additional Equipment:   Intra-op Plan:   Post-operative Plan: Extubation in OR  Informed Consent: I have reviewed the patients History and Physical, chart, labs and discussed the procedure including the risks, benefits and alternatives for the proposed anesthesia with the patient or authorized representative who has indicated his/her understanding and acceptance.   Dental advisory given  Plan Discussed with: CRNA and Anesthesiologist  Anesthesia Plan Comments:         Anesthesia Quick Evaluation

## 2015-11-10 NOTE — Progress Notes (Signed)
Patient ID: Sharon [name], female   DOB: 10-23-1955, 60 y.o.   MRN: [id number]  PROGRESS NOTE    Sharon [name]  TIR:[id number] DOB: 07-Apr-1956 DOA: 11/06/2015  PCP: [name], PA   Brief Narrative:  60 year old female with past medical history of breast cancer in remission, COPD and ongoing tobacco use who presented to ER for evaluation of lung mass after her CT scan findings on 11/04/2015 showed infiltrative mass in the region of the left hilum, left mediastinum and left upper lobe measuring 5.66.4 cm concerning for primary lung cancer versus metastatic breast cancer. Patient apparently had productive cough on and off for past 2-3 months prior to this admission. She was on at least 5 different courses of antibiotics which did not seem to provide adequate relief for cough. Patient was seen by pulmonary in consultation and plan is for bronchoscopy today.  Assessment & Plan:   Acute respiratory failure with hypoxia / postobstructive pneumonia due to possible lung malignancy - Respiratory status stable at this time -Patient is on empiric vancomycin and Zosyn for possible postobstructive pneumonia in the setting of infiltrative mass seen on CT scan - Appreciate pulmonary team and their recommendations - Plan is for bronchoscopy today with biopsy  - Respiratory culture re-incubated for better growth - Legionella is negative - HIV antibodies negative - Blood cultures not collected at the time of the admission - Continue Dulera twice daily  Tobacco use - Counseled on smoking cessation and the bedside  History of COPD  - Stable respiratory status    DVT prophylaxis: SCD's bilaterally  Code Status: full code  Family Communication: daughter at the bedside this am Disposition Plan: home today or tomorrow depending on pulmonary recommendations    Consultants:   Pulmonary   Procedures:   Bronchoscopy 7/17  Antimicrobials:   Vanco and zosyn 11/06/2015 -->    Subjective: No  overnight events.   Objective: Filed Vitals:   11/09/15 2020 11/09/15 2046 11/10/15 0454 11/10/15 1047  BP: 105/66  101/57   Pulse: 78  69   Temp: 98.1 F (36.7 C)  98 F (36.7 C)   TempSrc: Oral  Oral   Resp: 18  18   Weight:      SpO2: 95% 96% 97% 94%    Intake/Output Summary (Last 24 hours) at 11/10/15 1048 Last data filed at 11/10/15 0452  Gross per 24 hour  Intake    450 ml  Output      0 ml  Net    450 ml   Filed Weights   11/06/15 2000  Weight: 111.585 kg (246 lb)    Examination:  General exam: Appears calm and comfortable  Respiratory system: Respiratory effort normal. Diminished breath sounds, no wheezing  Cardiovascular system: S1 & S2 heard, RRR.  Gastrointestinal system: Abdomen is nondistended, soft and nontender. No organomegaly or masses felt. Normal bowel sounds heard. Central nervous system: Alert and oriented. No focal neurological deficits. Extremities: Symmetric 5 x 5 power. Skin: No rashes, lesions or ulcers Psychiatry: Judgement and insight appear normal. Mood & affect appropriate.   Data Reviewed: I have personally reviewed following labs and imaging studies  CBC:  Recent Labs Lab 11/06/15 1529 11/07/15 0140 11/08/15 0540  WBC 14.1* 12.8* 10.4  NEUTROABS  --   --  7.1  HGB 14.5 13.5 13.1  HCT 43.9 40.2 39.7  MCV 93.8 94.6 94.5  PLT 347 341 676   Basic Metabolic Panel:  Recent Labs Lab 11/06/15 1529 11/07/15 0140  NA 137 137  K 4.1 3.4*  CL 103 102  CO2 27 27  GLUCOSE 95 151*  BUN 10 10  CREATININE 0.69 0.86  CALCIUM 9.5 9.1   GFR: CrCl cannot be calculated (Unknown ideal weight.). Liver Function Tests:  Recent Labs Lab 11/07/15 0140  AST 25  ALT 26  ALKPHOS 65  BILITOT 0.8  PROT 6.5  ALBUMIN 3.4*   No results for input(s): LIPASE, AMYLASE in the last 168 hours. No results for input(s): AMMONIA in the last 168 hours. Coagulation Profile: No results for input(s): INR, PROTIME in the last 168 hours. Cardiac  Enzymes: No results for input(s): CKTOTAL, CKMB, CKMBINDEX, TROPONINI in the last 168 hours. BNP (last 3 results) No results for input(s): PROBNP in the last 8760 hours. HbA1C: No results for input(s): HGBA1C in the last 72 hours. CBG: No results for input(s): GLUCAP in the last 168 hours. Lipid Profile: No results for input(s): CHOL, HDL, LDLCALC, TRIG, CHOLHDL, LDLDIRECT in the last 72 hours. Thyroid Function Tests: No results for input(s): TSH, T4TOTAL, FREET4, T3FREE, THYROIDAB in the last 72 hours. Anemia Panel: No results for input(s): VITAMINB12, FOLATE, FERRITIN, TIBC, IRON, RETICCTPCT in the last 72 hours. Urine analysis: No results found for: COLORURINE, APPEARANCEUR, LABSPEC, PHURINE, GLUCOSEU, HGBUR, BILIRUBINUR, KETONESUR, PROTEINUR, UROBILINOGEN, NITRITE, LEUKOCYTESUR Sepsis Labs: '@LABRCNTIP'$ (procalcitonin:4,lacticidven:4)    Recent Results (from the past 240 hour(s))  MRSA PCR Screening     Status: None   Collection Time: 11/08/15  9:18 AM  Result Value Ref Range Status   MRSA by PCR NEGATIVE NEGATIVE Final  Culture, expectorated sputum-assessment     Status: None   Collection Time: 11/08/15 10:14 AM  Result Value Ref Range Status   Specimen Description SPUTUM  Final   Special Requests NONE  Final   Sputum evaluation   Final    THIS SPECIMEN IS ACCEPTABLE. RESPIRATORY CULTURE REPORT TO FOLLOW.   Report Status 11/08/2015 FINAL  Final  Culture, respiratory (NON-Expectorated)     Status: None (Preliminary result)   Collection Time: 11/08/15  1:49 PM  Result Value Ref Range Status   Specimen Description SPUTUM  Final   Special Requests NONE  Final   Gram Stain   Final    RARE WBC PRESENT, PREDOMINANTLY PMN FEW GRAM NEGATIVE COCCOBACILLI    Culture CULTURE REINCUBATED FOR BETTER GROWTH  Final   Report Status PENDING  Incomplete  MRSA PCR Screening     Status: None   Collection Time: 11/09/15 11:13 PM  Result Value Ref Range Status   MRSA by PCR NEGATIVE  NEGATIVE Final      Radiology Studies: Dg Chest 2 View 11/06/2015  Left upper lobe airspace disease tracking centrally into the left hilum. Central obstructing neoplasm could have this appearance. Comparison to the recent CT scan is recommended.    Scheduled Meds: . fluticasone  2 spray Each Nare Daily  . mometasone-formoterol  2 puff Inhalation BID  . montelukast  10 mg Oral QHS  . piperacillin-tazobactam (ZOSYN)  IV  3.375 g Intravenous Q8H  . potassium chloride  40 mEq Oral Once   Continuous Infusions:    LOS: 3 days    Time spent: 25 minutes  Greater than 50% of the time spent on counseling and coordinating the care.   Leisa Lenz, MD [facility] Pager [phone]  If 7PM-7AM, please contact night-coverage [email or web address] Password [facility] 11/10/2015, 10:48 AM

## 2015-11-10 NOTE — Anesthesia Postprocedure Evaluation (Signed)
Anesthesia Post Note  Patient: Vianca G Funderburk  Procedure(s) Performed: Procedure(s) (LRB): VIDEO BRONCHOSCOPY WITH ENDOBRONCHIAL ULTRASOUND (N/A)  Patient location during evaluation: PACU Anesthesia Type: General Level of consciousness: awake, awake and alert and oriented Pain management: pain level controlled Vital Signs Assessment: post-procedure vital signs reviewed and stable Respiratory status: spontaneous breathing, nonlabored ventilation and respiratory function stable Cardiovascular status: blood pressure returned to baseline Anesthetic complications: no    Last Vitals:  Filed Vitals:   11/10/15 1425 11/10/15 1430  BP: 136/78   Pulse: 72 77  Temp:  36.4 C  Resp: 23 19    Last Pain:  Filed Vitals:   11/10/15 1454  PainSc: 0-No pain                 Otto Caraway COKER

## 2015-11-10 NOTE — Transfer of Care (Signed)
Immediate Anesthesia Transfer of Care Note  Patient: Sharon [name]  Procedure(s) Performed: Procedure(s): VIDEO BRONCHOSCOPY WITH ENDOBRONCHIAL ULTRASOUND (N/A)  Patient Location: PACU  Anesthesia Type:General  Level of Consciousness: awake, alert  and oriented  Airway & Oxygen Therapy: Patient Spontanous Breathing and Patient connected to nasal cannula oxygen  Post-op Assessment: Report given to RN and Post -op Vital signs reviewed and stable  Post vital signs: Reviewed and stable  Last Vitals:  Filed Vitals:   11/09/15 2020 11/10/15 0454  BP: 105/66 101/57  Pulse: 78 69  Temp: 36.7 C 36.7 C  Resp: 18 18    Last Pain:  Filed Vitals:   11/10/15 1047  PainSc: 0-No pain         Complications: No apparent anesthesia complications

## 2015-11-10 NOTE — Discharge Summary (Addendum)
Physician Discharge Summary  Sharon [name] WLS:[id number] DOB: March 19, 1956 DOA: 11/06/2015  PCP: [name], PA  Admit date: 11/06/2015 Discharge date: 11/10/2015  Recommendations for Outpatient Follow-up:  1. Continue Augmentin for 10 more days on discharge.   Discharge Diagnoses:  Principal Problem:   CAP (community acquired pneumonia) Active Problems:   COPD (chronic obstructive pulmonary disease) ([place])   Lung mass   Pneumonia   Mediastinal adenopathy    Discharge Condition: stable   Diet recommendation: as tolerated   History of present illness:  60 year old female with past medical history of breast cancer in remission, COPD and ongoing tobacco use who presented to ER for evaluation of lung mass after her CT scan findings on 11/04/2015 showed infiltrative mass in the region of the left hilum, left mediastinum and left upper lobe measuring 5.66.4 cm concerning for primary lung cancer versus metastatic breast cancer. Patient apparently had productive cough on and off for past 2-3 months prior to this admission. She was on at least 5 different courses of antibiotics which did not seem to provide adequate relief for cough. Patient was seen by pulmonary in consultation. Bronchoscopy done today.  Hospital Course:    Assessment & Plan:  Acute respiratory failure with hypoxia / postobstructive pneumonia due to possible lung malignancy / Mediastinal adenopathy  - Respiratory status stable at this time -Patient is on empiric vancomycin and Zosyn for possible postobstructive pneumonia in the setting of infiltrative mass seen on CT scan. Augmentin for 10 days on discharge  - Pulm to follow up with pt about the biopsy results  - Appreciate pulmonary team and their recommendations - Bronchoscopy done today with biopsy  - Respiratory culture re-incubated for better growth - Legionella is negative - HIV antibodies negative - Blood cultures not collected at the time of the  admission - Continue nebs per home regimen   Tobacco use - Counseled on smoking cessation and the bedside  History of COPD  - Stable respiratory status    DVT prophylaxis: SCD's bilaterally  Code Status: full code  Family Communication: daughter at the bedside this am    Consultants:   Pulmonary  Procedures:   Bronchoscopy 7/17  Antimicrobials:   Vanco and zosyn 11/06/2015 --> 11/10/2015   Subjective:   Signed:  Leisa Lenz, MD  [facility] 11/10/2015, 3:52 PM  Pager #: [phone]  Time spent in minutes: more than 30 minutes   Discharge Exam: Filed Vitals:   11/10/15 1425 11/10/15 1430  BP: 136/78   Pulse: 72 77  Temp:  97.5 F (36.4 C)  Resp: 23 19   Filed Vitals:   11/10/15 1047 11/10/15 1410 11/10/15 1425 11/10/15 1430  BP:  134/66 136/78   Pulse:  83 72 77  Temp:  97.2 F (36.2 C)  97.5 F (36.4 C)  TempSrc:      Resp:  '21 23 19  '$ Weight:      SpO2: 94% 96% 97% 99%    General: Pt is alert, follows commands appropriately, not in acute distress Cardiovascular: Regular rate and rhythm, S1/S2 +, no murmurs Respiratory: Clear to auscultation bilaterally, no wheezing, no crackles, no rhonchi Abdominal: Soft, non tender, non distended, bowel sounds +, no guarding Extremities: no edema, no cyanosis, pulses palpable bilaterally DP and PT Neuro: Grossly nonfocal  Discharge Instructions  Discharge Instructions    Call MD for:  difficulty breathing, headache or visual disturbances    Complete by:  As directed      Call MD for:  persistant dizziness or light-headedness    Complete by:  As directed      Call MD for:  persistant nausea and vomiting    Complete by:  As directed      Call MD for:  severe uncontrolled pain    Complete by:  As directed      Diet - low sodium heart healthy    Complete by:  As directed      Discharge instructions    Complete by:  As directed   Continue Augmentin fo 10 days on discharge.     Increase  activity slowly    Complete by:  As directed             Medication List    STOP taking these medications        letrozole 2.5 MG tablet  Commonly known as:  Emerado these medications        acetaminophen 325 MG tablet  Commonly known as:  TYLENOL  Take 2 tablets (650 mg total) by mouth every 6 (six) hours as needed for mild pain (or Fever >/= 101).     albuterol 108 (90 Base) MCG/ACT inhaler  Commonly known as:  PROVENTIL HFA;VENTOLIN HFA  Inhale 1 puff into the lungs every 6 (six) hours as needed for wheezing or shortness of breath.     amoxicillin-clavulanate 875-125 MG tablet  Commonly known as:  AUGMENTIN  Take 1 tablet by mouth 2 (two) times daily.     budesonide-formoterol 160-4.5 MCG/ACT inhaler  Commonly known as:  SYMBICORT  Inhale 2 puffs into the lungs 2 (two) times daily.     fluticasone 27.5 MCG/SPRAY nasal spray  Commonly known as:  VERAMYST  Place 2 sprays into the nose daily.     Fluticasone-Salmeterol 250-50 MCG/DOSE Aepb  Commonly known as:  ADVAIR DISKUS  Inhale 1 puff into the lungs 2 (two) times daily.     montelukast 10 MG tablet  Commonly known as:  SINGULAIR  Take 10 mg by mouth at bedtime.            Follow-up Information    Schedule an appointment as soon as possible for a visit with Tera Partridge, MD.   Specialty:  Pulmonary Disease   Why:  Follow up appt after recent hospitalization   Contact information:   [address] [place] [address] [phone]        The results of significant diagnostics from this hospitalization (including imaging, microbiology, ancillary and laboratory) are listed below for reference.    Significant Diagnostic Studies: Dg Chest 2 View  11/06/2015  CLINICAL DATA:  Recurrent pneumonia for almost 2 months. By report had a CT scan 2 days ago showing pneumonia and a new mass. EXAM: CHEST  2 VIEW COMPARISON:  Chest x-ray 08/18/2011. FINDINGS: Left upper lobe airspace opacity  tracks into the hilum. Right lung is clear. The cardiopericardial silhouette is within normal limits for size. The visualized bony structures of the thorax are intact. IMPRESSION: Left upper lobe airspace disease tracking centrally into the left hilum. Central obstructing neoplasm could have this appearance. Comparison to the recent CT scan is recommended. Electronically Signed   By: Misty Stanley M.D.   On: 11/06/2015 15:49   Dg Chest Portable 1 View  11/10/2015  CLINICAL DATA:  Status post bronchoscopy EXAM: PORTABLE CHEST 1 VIEW COMPARISON:  11/06/2015 chest radiograph. FINDINGS: Stable cardiomediastinal silhouette with top-normal heart size. No pneumothorax. No  pleural effusion. Stable masslike opacity in the left upper parahilar lung. IMPRESSION: No pneumothorax. Stable masslike opacity in the left upper parahilar lung. Electronically Signed   By: Ilona Sorrel M.D.   On: 11/10/2015 14:12    Microbiology: Recent Results (from the past 240 hour(s))  MRSA PCR Screening     Status: None   Collection Time: 11/08/15  9:18 AM  Result Value Ref Range Status   MRSA by PCR NEGATIVE NEGATIVE Final    Comment:        The GeneXpert MRSA Assay (FDA approved for NASAL specimens only), is one component of a comprehensive MRSA colonization surveillance program. It is not intended to diagnose MRSA infection nor to guide or monitor treatment for MRSA infections.   Culture, expectorated sputum-assessment     Status: None   Collection Time: 11/08/15 10:14 AM  Result Value Ref Range Status   Specimen Description SPUTUM  Final   Special Requests NONE  Final   Sputum evaluation   Final    THIS SPECIMEN IS ACCEPTABLE. RESPIRATORY CULTURE REPORT TO FOLLOW.   Report Status 11/08/2015 FINAL  Final  Culture, respiratory (NON-Expectorated)     Status: None   Collection Time: 11/08/15  1:49 PM  Result Value Ref Range Status   Specimen Description SPUTUM  Final   Special Requests NONE  Final   Gram Stain    Final    RARE WBC PRESENT, PREDOMINANTLY PMN FEW GRAM NEGATIVE COCCOBACILLI    Culture Consistent with normal respiratory flora.  Final   Report Status 11/10/2015 FINAL  Final  MRSA PCR Screening     Status: None   Collection Time: 11/09/15 11:13 PM  Result Value Ref Range Status   MRSA by PCR NEGATIVE NEGATIVE Final    Comment:        The GeneXpert MRSA Assay (FDA approved for NASAL specimens only), is one component of a comprehensive MRSA colonization surveillance program. It is not intended to diagnose MRSA infection nor to guide or monitor treatment for MRSA infections.      Labs: Basic Metabolic Panel:  Recent Labs Lab 11/06/15 1529 11/07/15 0140  NA 137 137  K 4.1 3.4*  CL 103 102  CO2 27 27  GLUCOSE 95 151*  BUN 10 10  CREATININE 0.69 0.86  CALCIUM 9.5 9.1   Liver Function Tests:  Recent Labs Lab 11/07/15 0140  AST 25  ALT 26  ALKPHOS 65  BILITOT 0.8  PROT 6.5  ALBUMIN 3.4*   No results for input(s): LIPASE, AMYLASE in the last 168 hours. No results for input(s): AMMONIA in the last 168 hours. CBC:  Recent Labs Lab 11/06/15 1529 11/07/15 0140 11/08/15 0540  WBC 14.1* 12.8* 10.4  NEUTROABS  --   --  7.1  HGB 14.5 13.5 13.1  HCT 43.9 40.2 39.7  MCV 93.8 94.6 94.5  PLT 347 341 321   Cardiac Enzymes: No results for input(s): CKTOTAL, CKMB, CKMBINDEX, TROPONINI in the last 168 hours. BNP: BNP (last 3 results) No results for input(s): BNP in the last 8760 hours.  ProBNP (last 3 results) No results for input(s): PROBNP in the last 8760 hours.  CBG: No results for input(s): GLUCAP in the last 168 hours.

## 2015-11-11 ENCOUNTER — Encounter (HOSPITAL_COMMUNITY): Payer: Self-pay | Admitting: Pulmonary Disease

## 2015-11-11 LAB — PROCALCITONIN: Procalcitonin: <0.1 ng/mL

## 2015-11-11 MED ORDER — AMOXICILLIN-POT CLAVULANATE 875-125 MG PO TABS: 1.0000 | ORAL_TABLET | Freq: Two times a day (BID) | ORAL | Status: DC

## 2015-11-11 NOTE — Progress Notes (Signed)
Patient seen and examined at the bedside. Patient medically stable for discharge home today. Please refer to discharge summary completed 11/10/2015. No changes in medications since 11/10/2015. Pulmonary will follow-up with the patient in regards to bronchoscopy results.  Leisa Lenz [facility] [phone]

## 2015-11-11 NOTE — Progress Notes (Signed)
11/11/15 1033  Clinical Encounter Type  Visited With Patient  Visit Type Follow-up;Spiritual support;Post-op  Spiritual Encounters  Spiritual Needs Emotional  Stress Factors  Patient Stress Factors Health changes;Lack of knowledge   Chaplain provided follow-up care. Chaplain met with patient, who had a biopsy tomorrow and is heading home today. Chaplain offered prayer and support. Spiritual care services available as needed.   Adam M Barnes, Chaplain 11/11/2015 10:34 AM  

## 2015-11-11 NOTE — Progress Notes (Signed)
PCCM Attending Note: Patient seen briefly. Tolerated bronchoscopy well. No hemoptysis today. Denies any chest discomfort or difficulty breathing. Patient scheduled for follow-up on Monday 7/24 @ 9:30am with me. I gave her my contact information.  Sonia Baller Ashok Cordia, M.D. [facility] Pager:  [phone] After 3pm or if no response, call [phone] 12:18 PM 11/11/2015

## 2015-11-12 ENCOUNTER — Telehealth: Payer: Self-pay | Admitting: *Deleted

## 2015-11-12 ENCOUNTER — Telehealth: Payer: Self-pay | Admitting: Pulmonary Disease

## 2015-11-12 DIAGNOSIS — C3490 Malignant neoplasm of unspecified part of unspecified bronchus or lung: Secondary | ICD-10-CM

## 2015-11-12 NOTE — Telephone Encounter (Signed)
I have scheduled the pt for pft for this Friday at [facility].  The first openings the hospital had for PET & MRI is next Wed the 26th.  I have her scheduled.  Her appt with Dr Milinda Hirschfeld is prior to that.  I have called the pt & left her a vm to call me back for appt info.  I didn't know if you want to change her appt with JN until after these appointments??

## 2015-11-12 NOTE — Telephone Encounter (Signed)
I just spoke to the pt & gave her her appt info.  I told her I had already sent a message to JN's nurse to let her know PET & MRI is scheduled after appt to see him & I didn't know if his appt would need to be changed or not.  She said she would prefer to see JN either Monday afternoon or another day if possible.  She stated she is trying to keep her life as normal as possible with all this going on & she already has something planned for Monday morning.  She understands the seriousness of the situation & if that is the only time she can be seen she understands.   I told her I would relay the message to his nurse & explained his schedule is pretty booked at this point & I didn't know if the appt with him could be rescheduled until after testing or if she could see someone else.  She is very understanding & will wait for nurse to call & let her know what to do about ov appt.

## 2015-11-12 NOTE — Telephone Encounter (Signed)
Order's have been placed STAT. Will forward to PCC's to make aware.

## 2015-11-12 NOTE — Telephone Encounter (Signed)
Pet and mri 11/19/15 oncology will contact pt for that appt Sharon [name]

## 2015-11-12 NOTE — Telephone Encounter (Signed)
-----   Message from Javier Glazier, MD sent at 11/12/2015  3:37 PM EDT ----- Regarding: Referral & Tests Yoandri Congrove,  I'm seeing Mrs. Nuno next week. Gave her the results of her biopsy today. 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 We also need to put in for a PET CT, MRI Brain w/ & w/o contrast, and Full PFTs ASAP.  Thanks.

## 2015-11-12 NOTE — Telephone Encounter (Signed)
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 I explained we will need to proceed with further staging with an MRI of her brain as well as PET/CT imaging. Patient has a follow-up with me next week. We'll also order full PFTs as well as a referral to our [facility] clinic.

## 2015-11-13 ENCOUNTER — Telehealth: Payer: Self-pay | Admitting: *Deleted

## 2015-11-13 DIAGNOSIS — R918 Other nonspecific abnormal finding of lung field: Secondary | ICD-10-CM

## 2015-11-13 NOTE — Telephone Encounter (Signed)
Please advise Dr. Ashok Cordia if you want to r/s pt appt until after tests? thanks

## 2015-11-13 NOTE — Telephone Encounter (Signed)
Need to confirm with mother that it's ok to speak with daughter. Would prefer that daughter gets all her information from her mother but I'm happy to talk with her if we have her blessing.

## 2015-11-13 NOTE — Telephone Encounter (Signed)
Patient daughter called and is requesting a call back regarding next weeks testing -prm

## 2015-11-13 NOTE — Telephone Encounter (Signed)
Called and spoke with pt and she stated that it is ok for Dr. Ashok Cordia to speak with her daughter.    She stated that she has missed a lot of meetings at work and really needs to change the appt time on 7/24.  She stated that another day after the tests are fine or if we can fit her in on 7/24 in the afternoon.  Please advise about appt as well. thanks

## 2015-11-13 NOTE — Telephone Encounter (Signed)
I personally spoke with the patient's daughter and with her consent reviewing the pathology results as well as the plan moving forward with further brain MRI imaging as well as PET/CT imaging to completely stage her mother's  small cell lung cancer. I gave her the appointment dates as well as of her testing as well as with Dr. Julien Nordmann and her new appointment with me next Thursday. I explained that we may have her test results on Wednesday which isn't myself or Dr. Julien Nordmann may contact her with but I will certainly have them by her appointment on Thursday morning. I also urged her to attend the appointment with Dr. Julien Nordmann on Tuesday so that she may further discuss what to expect with regards to treatment and what options there may be for treatment.

## 2015-11-13 NOTE — Telephone Encounter (Signed)
My hope is that these can be accomplished before her appointment. If not we can push it back a bit.

## 2015-11-13 NOTE — Telephone Encounter (Signed)
Don't have her daughter's number. Take a look at my schedule and see if I have any open 15 minute slots that will work for her. Thanks.

## 2015-11-13 NOTE — Telephone Encounter (Signed)
Doroteo Bradford (pt daughter) can be reached at [phone]  Pt is scheduled for OV 7/27 at 9:45AM. Also she wanted to let Dr. Ashok Cordia know she is scheduled to see Dr. [name] 7/25

## 2015-11-13 NOTE — Telephone Encounter (Signed)
Sharon [name] called me back.  I gave her the appt to see Dr. Julien Nordmann on 11/18/15 arrive at 1:00 with labs at 1:15 and see Dr. Julien Nordmann at 1:30.  This appt was approved by Dr. Julien Nordmann.

## 2015-11-13 NOTE — Telephone Encounter (Signed)
Oncology Nurse Navigator Documentation  Oncology Nurse Navigator Flowsheets 11/13/2015  Navigator Encounter Type Introductory phone call  Confirmed Diagnosis Date 11/10/2015  Treatment Phase Pre-Tx/Tx Discussion  Barriers/Navigation Needs Coordination of Care  Interventions Coordination of Care  Coordination of Care Appts  Acuity Level 1  Acuity Level 1 Minimal follow up required  Time Spent with Patient 15   I received referral on Ms. Mahnken today.  I called patient to set up with an appt.  I was unable to reach and left my name and phone number to call.

## 2015-11-13 NOTE — Telephone Encounter (Signed)
Both mri brain and pet are on 11/19/15 Sharon [name]

## 2015-11-13 NOTE — Telephone Encounter (Signed)
That's fine. I can see her the current day of her appointment. What about MRI Brain?

## 2015-11-13 NOTE — Telephone Encounter (Signed)
PET/MRI brain is scheduled for 7/26. PFT scheduled 7/21.

## 2015-11-14 ENCOUNTER — Encounter (HOSPITAL_COMMUNITY): Payer: 59

## 2015-11-17 ENCOUNTER — Ambulatory Visit (HOSPITAL_COMMUNITY)
Admission: RE | Admit: 2015-11-17 | Discharge: 2015-11-17 | Disposition: A | Discharge status: Home / Self Care | Payer: 59 | Source: Ambulatory Visit | Attending: Pulmonary Disease | Admitting: Pulmonary Disease

## 2015-11-17 ENCOUNTER — Encounter (INDEPENDENT_AMBULATORY_CARE_PROVIDER_SITE_OTHER): Payer: Self-pay

## 2015-11-17 ENCOUNTER — Inpatient Hospital Stay: Payer: 59 | Admitting: Pulmonary Disease

## 2015-11-17 DIAGNOSIS — C3490 Malignant neoplasm of unspecified part of unspecified bronchus or lung: Secondary | ICD-10-CM | POA: Diagnosis not present

## 2015-11-17 LAB — PULMONARY FUNCTION TEST
DL/VA % PRED: 97 %
DL/VA: 4.89 ml/min/mmHg/L
DLCO COR: 17.17 ml/min/mmHg
DLCO cor % pred: 63 %
DLCO unc % pred: 63 %
DLCO unc: 17.01 ml/min/mmHg
FEF 25-75 Post: 0.53 L/sec
FEF 25-75 Pre: 0.74 L/sec
FEF2575-%CHANGE-POST: -28 %
FEF2575-%Pred-Post: 21 %
FEF2575-%Pred-Pre: 29 %
FEV1-%CHANGE-POST: -3 %
FEV1-%PRED-PRE: 47 %
FEV1-%Pred-Post: 45 %
FEV1-POST: 1.26 L
FEV1-Pre: 1.31 L
FEV1FVC-%CHANGE-POST: 1 %
FEV1FVC-%Pred-Pre: 82 %
FEV6-%Change-Post: -8 %
FEV6-%PRED-PRE: 58 %
FEV6-%Pred-Post: 53 %
FEV6-POST: 1.85 L
FEV6-PRE: 2.03 L
FEV6FVC-%Change-Post: 0 %
FEV6FVC-%PRED-POST: 101 %
FEV6FVC-%PRED-PRE: 102 %
FVC-%CHANGE-POST: -4 %
FVC-%PRED-POST: 54 %
FVC-%PRED-PRE: 56 %
FVC-POST: 1.94 L
FVC-PRE: 2.04 L
POST FEV6/FVC RATIO: 99 %
PRE FEV6/FVC RATIO: 100 %
Post FEV1/FVC ratio: 65 %
Pre FEV1/FVC ratio: 64 %
RV % pred: 134 %
RV: 2.77 L
TLC % PRED: 92 %
TLC: 4.94 L

## 2015-11-17 MED ORDER — ALBUTEROL SULFATE (2.5 MG/3ML) 0.083% IN NEBU
2.5000 mg | INHALATION_SOLUTION | Freq: Once | RESPIRATORY_TRACT | Status: AC
  Administered 2015-11-17: 2.5 mg via RESPIRATORY_TRACT

## 2015-11-18 ENCOUNTER — Other Ambulatory Visit (HOSPITAL_BASED_OUTPATIENT_CLINIC_OR_DEPARTMENT_OTHER): Payer: 59

## 2015-11-18 ENCOUNTER — Ambulatory Visit (HOSPITAL_BASED_OUTPATIENT_CLINIC_OR_DEPARTMENT_OTHER): Payer: 59 | Admitting: Internal Medicine

## 2015-11-18 ENCOUNTER — Telehealth: Payer: Self-pay | Admitting: *Deleted

## 2015-11-18 ENCOUNTER — Encounter: Payer: Self-pay | Admitting: Internal Medicine

## 2015-11-18 ENCOUNTER — Telehealth: Payer: Self-pay | Admitting: Internal Medicine

## 2015-11-18 DIAGNOSIS — C3492 Malignant neoplasm of unspecified part of left bronchus or lung: Secondary | ICD-10-CM

## 2015-11-18 DIAGNOSIS — Z853 Personal history of malignant neoplasm of breast: Secondary | ICD-10-CM | POA: Diagnosis not present

## 2015-11-18 DIAGNOSIS — R918 Other nonspecific abnormal finding of lung field: Secondary | ICD-10-CM

## 2015-11-18 DIAGNOSIS — Z720 Tobacco use: Secondary | ICD-10-CM

## 2015-11-18 DIAGNOSIS — C3412 Malignant neoplasm of upper lobe, left bronchus or lung: Secondary | ICD-10-CM | POA: Diagnosis not present

## 2015-11-18 LAB — CBC WITH DIFFERENTIAL/PLATELET
BASO%: 0.7 % (ref 0.0–2.0)
Basophils Absolute: 0.1 10*3/uL (ref 0.0–0.1)
EOS ABS: 0.2 10*3/uL (ref 0.0–0.5)
EOS%: 2.2 % (ref 0.0–7.0)
HCT: 41.7 % (ref 34.8–46.6)
HEMOGLOBIN: 14 g/dL (ref 11.6–15.9)
LYMPH#: 1.9 10*3/uL (ref 0.9–3.3)
LYMPH%: 17.3 % (ref 14.0–49.7)
MCH: 31.2 pg (ref 25.1–34.0)
MCHC: 33.5 g/dL (ref 31.5–36.0)
MCV: 93 fL (ref 79.5–101.0)
MONO#: 0.9 10*3/uL (ref 0.1–0.9)
MONO%: 8.3 % (ref 0.0–14.0)
NEUT%: 71.5 % (ref 38.4–76.8)
NEUTROS ABS: 7.8 10*3/uL — AB (ref 1.5–6.5)
Platelets: 343 10*3/uL (ref 145–400)
RBC: 4.48 10*6/uL (ref 3.70–5.45)
RDW: 13.5 % (ref 11.2–14.5)
WBC: 10.9 10*3/uL — AB (ref 3.9–10.3)

## 2015-11-18 LAB — COMPREHENSIVE METABOLIC PANEL
ALT: 27 U/L (ref 0–55)
AST: 18 U/L (ref 5–34)
Albumin: 3.4 g/dL — ABNORMAL LOW (ref 3.5–5.0)
Alkaline Phosphatase: 88 U/L (ref 40–150)
Anion Gap: 10 mEq/L (ref 3–11)
BUN: 10 mg/dL (ref 7.0–26.0)
CALCIUM: 9.7 mg/dL (ref 8.4–10.4)
CHLORIDE: 103 meq/L (ref 98–109)
CO2: 25 mEq/L (ref 22–29)
Creatinine: 0.7 mg/dL (ref 0.6–1.1)
EGFR: >90 mL/min/{1.73_m2} (ref >90)
GLUCOSE: 83 mg/dL (ref 70–140)
POTASSIUM: 4.3 meq/L (ref 3.5–5.1)
SODIUM: 138 meq/L (ref 136–145)
Total Bilirubin: 0.36 mg/dL (ref 0.20–1.20)
Total Protein: 7.2 g/dL (ref 6.4–8.3)

## 2015-11-18 MED ORDER — PROCHLORPERAZINE MALEATE 10 MG PO TABS: 10.0000 mg | ORAL_TABLET | Freq: Four times a day (QID) | ORAL | 0 refills | Status: DC | PRN

## 2015-11-18 NOTE — Progress Notes (Signed)
[facility] [phone]   [phone]  CONSULT NOTE  REFERRING PHYSICIAN: Dr. Tera Partridge  REASON FOR CONSULTATION:  60 years old white female recently diagnosed with lung cancer.  HPI Sharon [name] is a 60 y.o. female was past medical history significant for right breast carcinoma status post right breast lumpectomy followed by radiation therapy as well as adjuvant chemotherapy and hormonal treatment with Femara for 5 years under the care of Dr. Truddie Coco, COPD, pneumonia, hypertension, prediabetes, vitamin D deficiency as well as long history of smoking.  Few months ago the patient was visiting a family during her work and there was a bad smell coming from that house. She feels some tightness in her chest as well as shortness of breath that was getting worse over the following weeks. She also developed hoarseness of her voice. She was treated with several courses of antibiotics with no improvement. She was then seen by her primary care physician. Chest x-ray on 11/03/2015 showed infiltrate and/or consolidation in the left upper lobe extending to the left hilum possibly representing pneumonia. This was followed by CT scan of the chest on 11/04/2015 showed Infiltrative mass in the region of the left hilum, left mediastinum and left upper lobe difficult to delineate from adjacent areas of pneumonia measuring approximately 5.6 x 6.4 cm. This may be a primary lung cancer or, less likely, metastatic breast cancer. The mass narrows multiple pulmonary arteries and bronchi. The scan also showed left upper lobe and lingular pneumonia. There was also mediastinal adenopathy with a reference node right paratracheal node measuring 17 mm. 3.5 x 1.3 cm subcarinal node. The patient was seen by Dr. Ashok Cordia and on 11/10/2015 she underwent bronchoscopy with airway inspection as well as endobronchial ultrasound and needle aspiration of the left upper lobe lung mass. The final cytology  (Accession: [id number]) patchy fine needle aspiration of 4L lymph node showed malignant cells. Immunohistochemistry is performed on the cell block and the malignant cells are positive with CD56, cytokeratin AE1/AE3, cytokeratin 7, chromogranin, synaptophysin and thyroid transcription factor-1. The malignant cells are negative with estrogen receptor, progesterone receptor, gross cystic disease fluid protein, Napsin-A, CD45 and S100. The morphology and immunophenotype are consistent with primary lung small cell carcinoma. Dr. Ashok Cordia kindly referred the patient to me today for evaluation and recommendation regarding treatment of her condition. When seen today she continues to complain of shortness breath as well as dry cough and hoarseness of her voice. She denied having any significant chest pain or hemoptysis. She has no significant weight loss or night sweats. She has no nausea, vomiting, diarrhea or constipation. She has no headache or visual changes. She is scheduled to have a PET scan and MRI of the brain tomorrow. Family history is unknown since the patient is adopted. She is single and has 3 children 2 sons and 1 daughter. She lives with her son in [place].  She is a Air traffic controller. She has a history of smoking 2 pack per day for around 44 years and unfortunately she continues to smoke but trying to quit. She also smokes marijuana. No history of current alcohol or other drug abuse.   HPI  Past Medical History:  Diagnosis Date  . Cancer of right breast ([place]) 2009  . CAP (community acquired pneumonia) 11/06/2015  . COPD (chronic obstructive pulmonary disease) ([place])   . Elevated blood pressure   . Hoarseness of voice    "for the last 2 months" (11/06/2015)  .  Lung mass dx'd 10/2015  . Menopause   . Pre-diabetes   . Vitamin D deficiency     Past Surgical History:  Procedure Laterality Date  . BREAST BIOPSY Right 2009  . BREAST LUMPECTOMY Right 2009  . UTERINE  FIBROID SURGERY  2000  . VAGINAL HYSTERECTOMY  2000  . VESICOVAGINAL FISTULA CLOSURE W/ TAH  2000  . VIDEO BRONCHOSCOPY WITH ENDOBRONCHIAL ULTRASOUND N/A 11/10/2015   Procedure: VIDEO BRONCHOSCOPY WITH ENDOBRONCHIAL ULTRASOUND;  Surgeon: Javier Glazier, MD;  Location: [facility];  Service: Thoracic;  Laterality: N/A;    Family History  Problem Relation Age of Onset  . Adopted: Yes    Social History Social History  Substance Use Topics  . Smoking status: Current Every Day Smoker    Packs/day: 0.50    Years: 46.00    Types: Cigarettes  . Smokeless tobacco: Never Used  . Alcohol use No    No Known Allergies  Current Outpatient Prescriptions  Medication Sig Dispense Refill  . acetaminophen (TYLENOL) 325 MG tablet Take 2 tablets (650 mg total) by mouth every 6 (six) hours as needed for mild pain (or Fever >/= 101). 30 tablet 0  . albuterol (PROVENTIL HFA;VENTOLIN HFA) 108 (90 Base) MCG/ACT inhaler Inhale 1 puff into the lungs every 6 (six) hours as needed for wheezing or shortness of breath.    Marland Kitchen amoxicillin-clavulanate (AUGMENTIN) 875-125 MG tablet Take 1 tablet by mouth 2 (two) times daily. 20 tablet 0  . budesonide-formoterol (SYMBICORT) 160-4.5 MCG/ACT inhaler Inhale 2 puffs into the lungs 2 (two) times daily.    . fluconazole (DIFLUCAN) 150 MG tablet   0  . fluticasone (FLONASE) 50 MCG/ACT nasal spray Place into the nose.    . fluticasone (VERAMYST) 27.5 MCG/SPRAY nasal spray Place 2 sprays into the nose daily.    . montelukast (SINGULAIR) 10 MG tablet Take 10 mg by mouth at bedtime.    . predniSONE (DELTASONE) 10 MG tablet Take 6 pills on day 1, 5 pills on day 2, 4 pills on day 3, 3 pills on day 4, 2 pills on day 5, and 1 pill on day 6    . Fluticasone-Salmeterol (ADVAIR DISKUS) 250-50 MCG/DOSE AEPB Inhale 1 puff into the lungs 2 (two) times daily. 60 each 3   No current facility-administered medications for this visit.     Review of Systems  Constitutional: negative Eyes:  negative Ears, nose, mouth, throat, and face: negative Respiratory: positive for cough and dyspnea on exertion Cardiovascular: negative Gastrointestinal: negative Genitourinary:negative Integument/breast: negative Hematologic/lymphatic: negative Musculoskeletal:negative Neurological: negative Behavioral/Psych: negative Endocrine: negative Allergic/Immunologic: negative  Physical Exam  BHA:LPFXT, healthy, no distress, well nourished and well developed SKIN: skin color, texture, turgor are normal, no rashes or significant lesions HEAD: Normocephalic, No masses, lesions, tenderness or abnormalities EYES: normal, PERRLA, Conjunctiva are pink and non-injected EARS: External ears normal, Canals clear OROPHARYNX:no exudate, no erythema and lips, buccal mucosa, and tongue normal  NECK: supple, no adenopathy, no JVD LYMPH:  no palpable lymphadenopathy, no hepatosplenomegaly BREAST:not examined LUNGS: expiratory wheezes bilaterally HEART: regular rate & rhythm, no murmurs and no gallops ABDOMEN:abdomen soft, non-tender, obese, normal bowel sounds and no masses or organomegaly BACK: Back symmetric, no curvature., No CVA tenderness EXTREMITIES:no joint deformities, effusion, or inflammation, no edema, no skin discoloration  NEURO: alert & oriented x 3 with fluent speech, no focal motor/sensory deficits  PERFORMANCE STATUS: ECOG 1  LABORATORY DATA: Lab Results  Component Value Date   WBC 10.9 (H) 11/18/2015  HGB 14.0 11/18/2015   HCT 41.7 11/18/2015   MCV 93.0 11/18/2015   PLT 343 11/18/2015      Chemistry      Component Value Date/Time   NA 137 11/07/2015 0140   NA 140 07/18/2012 1451   K 3.4 (L) 11/07/2015 0140   K 4.4 07/18/2012 1451   CL 102 11/07/2015 0140   CL 104 07/18/2012 1451   CO2 27 11/07/2015 0140   CO2 27 07/18/2012 1451   BUN 10 11/07/2015 0140   BUN 12.6 07/18/2012 1451   CREATININE 0.86 11/07/2015 0140   CREATININE 0.7 07/18/2012 1451      Component  Value Date/Time   CALCIUM 9.1 11/07/2015 0140   CALCIUM 9.7 07/18/2012 1451   ALKPHOS 65 11/07/2015 0140   ALKPHOS 88 07/18/2012 1451   AST 25 11/07/2015 0140   AST 15 07/18/2012 1451   ALT 26 11/07/2015 0140   ALT 20 07/18/2012 1451   BILITOT 0.8 11/07/2015 0140   BILITOT 0.20 07/18/2012 1451       RADIOGRAPHIC STUDIES: Dg Chest 2 View  Result Date: 11/06/2015 CLINICAL DATA:  Recurrent pneumonia for almost 2 months. By report had a CT scan 2 days ago showing pneumonia and a new mass. EXAM: CHEST  2 VIEW COMPARISON:  Chest x-ray 08/18/2011. FINDINGS: Left upper lobe airspace opacity tracks into the hilum. Right lung is clear. The cardiopericardial silhouette is within normal limits for size. The visualized bony structures of the thorax are intact. IMPRESSION: Left upper lobe airspace disease tracking centrally into the left hilum. Central obstructing neoplasm could have this appearance. Comparison to the recent CT scan is recommended. Electronically Signed   By: Misty Stanley M.D.   On: 11/06/2015 15:49   Dg Chest Portable 1 View  Result Date: 11/10/2015 CLINICAL DATA:  Status post bronchoscopy EXAM: PORTABLE CHEST 1 VIEW COMPARISON:  11/06/2015 chest radiograph. FINDINGS: Stable cardiomediastinal silhouette with top-normal heart size. No pneumothorax. No pleural effusion. Stable masslike opacity in the left upper parahilar lung. IMPRESSION: No pneumothorax. Stable masslike opacity in the left upper parahilar lung. Electronically Signed   By: Ilona Sorrel M.D.   On: 11/10/2015 14:12    ASSESSMENT: This is a very pleasant 60 years old white female recently diagnosed with limited stage (T2b, N2, M0) small cell lung cancer, pending further staging workup diagnosed in July 2017 and presented with large left upper lobe lung mass and mediastinal lymphadenopathy.   PLAN: I had a lengthy discussion with the patient and her friend today about her current disease stage, prognosis and treatment  options. I explained to the patient that I'll be waiting for the final staging workup with PET scan as well as MRI of the brain to rule out any metastatic disease. If there is no evidence for metastatic disease outside the currently known lesions, the patient may benefit from treatment with a course of systemic chemotherapy with cisplatin and etoposide concurrent with radiation. I would consider her for treatment with cisplatin 60 MG/M2 on day 1 and etoposide 120 MG/M2 on days 1, 2 and 3.  I discussed with the patient adverse effect of this treatment including but not limited to alopecia, myelosuppression, nausea and vomiting, peripheral neuropathy, liver or renal dysfunction, as well as hearing deficit. If the patient has evidence for metastatic disease, I may treat her with carboplatin and etoposide as a single modality and will consider palliative radiotherapy if needed in the future. The patient is interested in treatment and she is expected  to start the first dose of her treatment on 11/24/2015. I will arrange for the patient to have a chemotherapy education class before the first dose of her treatment. I will call her pharmacy with prescription for Compazine 10 mg by mouth every 6 hours as needed for nausea. For smoke cessation, strongly encouraged the patient to quit smoking and offered her to smoke cessation program. The patient voices understanding of current disease status and treatment options and is in agreement with the current care plan. The patient would come back for follow-up visit in 2 weeks for evaluation and management of any adverse effect of her treatment. She was advised to call immediately if she has any concerning symptoms in the interval. All questions were answered. The patient knows to call the clinic with any problems, questions or concerns. We can certainly see the patient much sooner if necessary.  Thank you so much for allowing me to participate in the care of Lurlie G  Wurm. I will continue to follow up the patient with you and assist in her care.  I spent 55 minutes counseling the patient face to face. The total time spent in the appointment was 80 minutes.  Disclaimer: This note was dictated with voice recognition software. Similar sounding words can inadvertently be transcribed and may not be corrected upon review.   Chalonda Schlatter K. November 18, 2015, 1:36 PM

## 2015-11-18 NOTE — Telephone Encounter (Signed)
Per staff message and POF I have scheduled appts. Advised scheduler of appts. JMW  

## 2015-11-18 NOTE — Telephone Encounter (Signed)
Gave pt cal & avs °

## 2015-11-19 ENCOUNTER — Ambulatory Visit (HOSPITAL_COMMUNITY)
Admission: RE | Admit: 2015-11-19 | Discharge: 2015-11-19 | Disposition: A | Discharge status: Home / Self Care | Payer: 59 | Source: Ambulatory Visit | Attending: Pulmonary Disease | Admitting: Pulmonary Disease

## 2015-11-19 ENCOUNTER — Encounter: Payer: Self-pay | Admitting: *Deleted

## 2015-11-19 ENCOUNTER — Telehealth: Payer: Self-pay | Admitting: *Deleted

## 2015-11-19 DIAGNOSIS — R590 Localized enlarged lymph nodes: Secondary | ICD-10-CM | POA: Insufficient documentation

## 2015-11-19 DIAGNOSIS — D3501 Benign neoplasm of right adrenal gland: Secondary | ICD-10-CM | POA: Insufficient documentation

## 2015-11-19 DIAGNOSIS — K760 Fatty (change of) liver, not elsewhere classified: Secondary | ICD-10-CM | POA: Diagnosis not present

## 2015-11-19 DIAGNOSIS — R918 Other nonspecific abnormal finding of lung field: Secondary | ICD-10-CM | POA: Insufficient documentation

## 2015-11-19 DIAGNOSIS — C3490 Malignant neoplasm of unspecified part of unspecified bronchus or lung: Secondary | ICD-10-CM

## 2015-11-19 DIAGNOSIS — G9389 Other specified disorders of brain: Secondary | ICD-10-CM | POA: Diagnosis not present

## 2015-11-19 LAB — GLUCOSE, CAPILLARY: Glucose-Capillary: 97 mg/dL (ref 65–99)

## 2015-11-19 MED ORDER — FLUDEOXYGLUCOSE F - 18 (FDG) INJECTION
12.8000 | Freq: Once | INTRAVENOUS | Status: AC | PRN
  Administered 2015-11-19: 12.8 via INTRAVENOUS

## 2015-11-19 MED ORDER — GADOBENATE DIMEGLUMINE 529 MG/ML IV SOLN
20.0000 mL | Freq: Once | INTRAVENOUS | Status: AC | PRN
  Administered 2015-11-19: 20 mL via INTRAVENOUS

## 2015-11-19 NOTE — Progress Notes (Signed)
Oncology Nurse Navigator Documentation  Oncology Nurse Navigator Flowsheets 11/19/2015  Navigator Encounter Type Other  Treatment Phase Pre-Tx/Tx Discussion  Barriers/Navigation Needs Coordination of Care  Interventions Coordination of Care  Coordination of Care Appts  Acuity Level 2  Acuity Level 2 Other  Time Spent with Patient 30   I followed up with Dr. Julien Nordmann regarding [name]'s PET scan that was completed today.  He stated treatment regimen will be [id number]/OR56.  I updated Sharyn Lull for chemo scheduling.

## 2015-11-19 NOTE — Telephone Encounter (Signed)
I have called and left her an message to call the office back. Appt on 7/31 changed from 3hrs to 7hrs, per my scheduling mistake. Needs to be given update date/time for 7/31

## 2015-11-20 ENCOUNTER — Telehealth: Payer: Self-pay | Admitting: Pulmonary Disease

## 2015-11-20 ENCOUNTER — Other Ambulatory Visit: Payer: 59

## 2015-11-20 ENCOUNTER — Ambulatory Visit (INDEPENDENT_AMBULATORY_CARE_PROVIDER_SITE_OTHER): Payer: 59 | Admitting: Pulmonary Disease

## 2015-11-20 ENCOUNTER — Encounter: Payer: Self-pay | Admitting: Pulmonary Disease

## 2015-11-20 VITALS — BP 138/80 | HR 100 | Ht 66.0 in | Wt 256.8 lb

## 2015-11-20 DIAGNOSIS — J449 Chronic obstructive pulmonary disease, unspecified: Secondary | ICD-10-CM

## 2015-11-20 DIAGNOSIS — F17200 Nicotine dependence, unspecified, uncomplicated: Secondary | ICD-10-CM

## 2015-11-20 DIAGNOSIS — C3490 Malignant neoplasm of unspecified part of unspecified bronchus or lung: Secondary | ICD-10-CM

## 2015-11-20 DIAGNOSIS — Z23 Encounter for immunization: Secondary | ICD-10-CM | POA: Diagnosis not present

## 2015-11-20 NOTE — Patient Instructions (Addendum)
   Call me if you have any new problems breathing before your next appointment  Continue taking your Symbicort twice daily as prescribed  Call me if you need refills on your medications  I will see you back in 2-3 months (8-12 weeks) with breathing tests to see how your lungs are working.  TESTS ORDERED: 1. Alpha-1 antitrypsin phenotype today 2. Spirometry with bronchodilator challenge & DLCO at next appointment

## 2015-11-20 NOTE — Addendum Note (Signed)
Addended by: Inge Rise on: 11/20/2015 10:29 AM   Modules accepted: Orders

## 2015-11-20 NOTE — Progress Notes (Signed)
Subjective:    Patient ID: Sharon [name], female    DOB: 08-13-1955, 60 y.o.   MRN: [id number]  C.C.:  Follow-up for Small Cell Lung Cancer, Severe COPD, & Tobacco Use Disorder.  HPI Small Cell Lung Cancer: Newly diagnosed after mediastinal lymph node biopsy. Patient has seen Dr. [name] & is planned for treatment initially with chemotherapy followed by radiation therapy.  Severe COPD: Newly diagnosed based on pulmonary function testing. She reports no dyspnea. She reports she does have dyspnea on exertion with increased heat & humidity. Reports a weak voice. No significant coughing or wheezing.   Tobacco Use Disorder:  Patient reports she has nearly completely stopped smoking and is only smoking 1 cigarette a day at most.   Review of Systems Denies any chest pain or pressure. No chills or fever. Previously had fevers. No headaches. No focal vision loss, weakness, numbness, or tingling.   No Known Allergies  Current Outpatient Prescriptions on File Prior to Visit  Medication Sig Dispense Refill  . albuterol (PROVENTIL HFA;VENTOLIN HFA) 108 (90 Base) MCG/ACT inhaler Inhale 1 puff into the lungs every 6 (six) hours as needed for wheezing or shortness of breath.    Marland Kitchen amoxicillin-clavulanate (AUGMENTIN) 875-125 MG tablet Take 1 tablet by mouth 2 (two) times daily. 20 tablet 0  . budesonide-formoterol (SYMBICORT) 160-4.5 MCG/ACT inhaler Inhale 2 puffs into the lungs 2 (two) times daily.    . fluticasone (VERAMYST) 27.5 MCG/SPRAY nasal spray Place 2 sprays into the nose daily.    . montelukast (SINGULAIR) 10 MG tablet Take 10 mg by mouth at bedtime.    . prochlorperazine (COMPAZINE) 10 MG tablet Take 1 tablet (10 mg total) by mouth every 6 (six) hours as needed for nausea or vomiting. 30 tablet 0  . acetaminophen (TYLENOL) 325 MG tablet Take 2 tablets (650 mg total) by mouth every 6 (six) hours as needed for mild pain (or Fever >/= 101). (Patient not taking: Reported on 11/20/2015) 30 tablet 0    . fluconazole (DIFLUCAN) 150 MG tablet   0  . fluticasone (FLONASE) 50 MCG/ACT nasal spray Place into the nose.    Marland Kitchen Fluticasone-Salmeterol (ADVAIR DISKUS) 250-50 MCG/DOSE AEPB Inhale 1 puff into the lungs 2 (two) times daily. 60 each 3  . predniSONE (DELTASONE) 10 MG tablet Take 6 pills on day 1, 5 pills on day 2, 4 pills on day 3, 3 pills on day 4, 2 pills on day 5, and 1 pill on day 6     No current facility-administered medications on file prior to visit.     Past Medical History:  Diagnosis Date  . Cancer of right breast ([place]) 2009  . CAP (community acquired pneumonia) 11/06/2015  . COPD (chronic obstructive pulmonary disease) ([place])   . Elevated blood pressure   . Hoarseness of voice    "for the last 2 months" (11/06/2015)  . Lung mass dx'd 10/2015  . Menopause   . Pre-diabetes   . Small cell lung cancer ([place])   . Vitamin D deficiency     Past Surgical History:  Procedure Laterality Date  . BREAST BIOPSY Right 2009  . BREAST LUMPECTOMY Right 2009  . UTERINE FIBROID SURGERY  2000  . VAGINAL HYSTERECTOMY  2000  . VESICOVAGINAL FISTULA CLOSURE W/ TAH  2000  . VIDEO BRONCHOSCOPY WITH ENDOBRONCHIAL ULTRASOUND N/A 11/10/2015   Procedure: VIDEO BRONCHOSCOPY WITH ENDOBRONCHIAL ULTRASOUND;  Surgeon: Javier Glazier, MD;  Location: [facility];  Service: Thoracic;  Laterality: N/A;  Family History  Problem Relation Age of Onset  . Adopted: Yes    Social History   Social History  . Marital status: Divorced    Spouse name: N/A  . Number of children: 3  . 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 Smoking status: Current Every Day Smoker    Packs/day: 0.10    Years: 46.00    Types: Cigarettes  . Smokeless tobacco: Never Used     Comment: Peak rate of 2ppd  . Alcohol use No  . Drug use:     Types: Marijuana     Comment: 11/06/2015 "1-2 times/week when I do smoke; none lately"  . Sexual activity: Not Asked   Other  Topics Concern  . None   Social History Narrative  . None      Objective:   Physical Exam BP 138/80 (BP Location: Left Arm, Cuff Size: Large)   Pulse 100   Ht '5\' 6"'$  (1.676 m)   Wt 256 lb 12.8 oz (116.5 kg)   SpO2 96%   BMI 41.45 kg/m  General:  Awake. No acute distress. Accompanied by family today. Integument:  Warm & dry. No rash on exposed skin.  HEENT:  Moist mucus membranes. No oral ulcers. No scleral injection or icterus.  Cardiovascular:  Regular rate. No edema. Normal S1 & S2. Pulmonary:  Good aeration & clear to auscultation bilaterally. No accessory muscle use on room air. Abdomen: Soft. Normal bowel sounds. Protuberant.  PFT 11/17/15: FVC 2.04 L (56%) FEV1 1.31 L (47%) FEV1/FVC 0.64 FEF 25-75 0.74 L (29%) no bronchodilator response TLC 4.94 L (92%) RV 134% ERV 13% DLCO corrected 63% (Hgb 13.1)  IMAGING MRI BRAIN W/ & W/O 11/19/15 (per radiologist): Mild subcortical and periventricular T2 & FLAIR hyperintensities likely chronic microvascular ischemic changes. 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 No intracranial metastatic disease is observed.  PET CT 11/19/15 (per radiologist): Hypermetabolic left upper lobe mass, bilateral mediastinal, subcarinal, & bilateral supraclavicular adenopathy. 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 Hypermetabolism within body of left adrenal gland without definite CT correlate. Hepatic steatosis noted. Right adrenal adenoma noted.  PATHOLOGY EBUS 11/10/15:  Level 4L Small Cell Carcinoma / Level 7 Malignant Cells Present    Assessment & Plan:  60 year old female with newly diagnosed small cell lung cancer and severe COPD.Symptomatically patient seems to be well-controlled with regards to her underlying COPD. Her spirometry shows no significant bronchodilator response therefore I do not feel adding additional inhalers will be of great symptomatically benefit. I'm  continuing her on her current controller medication. We did discuss the need for complete tobacco cessation for over 3 minutes and at length today as she continues to smoke 1 cigarette daily. I instructed the patient contact my office if she had any new breathing problems before her next appointment as I would be happy to see her sooner.  1. Small Cell Lung Cancer:  Continuing on treatment planned by Dr. Julien Nordmann. 2. Severe COPD: Patient continuing on Symbicort twice daily. Screening for alpha-1 antitrypsin deficiency. Repeat spirometry with bronchodilator challenge & DLCO at next appointment. 3. Tobacco Use Disorder: Congratulated patient on having quit smoking nearly completely. Recommended trying nicotine patches and intermittently for replacement. 4. Health Maintenance: Administering Pneumovax 23 today. Plan for influenza vaccine in the Fall. 5. Follow-up: Return to clinic in 2-3 months or sooner.  Sonia Baller Ashok Cordia, M.D. [facility] Pulmonary &  Critical Care Pager:  [phone] After 3pm or if no response, call [phone] 10:06 AM 11/20/15

## 2015-11-20 NOTE — Telephone Encounter (Signed)
PFT 11/17/15: FVC 2.04 L (56%) FEV1 1.31 L (47%) FEV1/FVC 0.64 FEF 25-75 0.74 L (29%) no bronchodilator response TLC 4.94 L (92%) RV 134% ERV 13% DLCO corrected 63% (Hgb 13.1)  IMAGING MRI BRAIN W/ & W/O 11/19/15 (per radiologist): Mild subcortical and periventricular T2 & FLAIR hyperintensities likely chronic microvascular ischemic changes. 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 No intracranial metastatic disease is observed.  PET CT 11/19/15 (per radiologist): Hypermetabolic left upper lobe mass, bilateral mediastinal, subcarinal, & bilateral supraclavicular adenopathy. 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 Hypermetabolism within body of left adrenal gland without definite CT correlate. Hepatic steatosis noted. Right adrenal adenoma noted.  PATHOLOGY EBUS 11/10/15:  Level 4L Small Cell Carcinoma / Level 7 Malignant Cells Present

## 2015-11-21 ENCOUNTER — Other Ambulatory Visit: Payer: 59

## 2015-11-24 ENCOUNTER — Other Ambulatory Visit (HOSPITAL_BASED_OUTPATIENT_CLINIC_OR_DEPARTMENT_OTHER): Payer: PRIVATE HEALTH INSURANCE

## 2015-11-24 ENCOUNTER — Ambulatory Visit (HOSPITAL_BASED_OUTPATIENT_CLINIC_OR_DEPARTMENT_OTHER): Payer: PRIVATE HEALTH INSURANCE

## 2015-11-24 ENCOUNTER — Ambulatory Visit: Payer: 59

## 2015-11-24 VITALS — BP 135/72 | HR 84 | Temp 98.1°F | Resp 18

## 2015-11-24 DIAGNOSIS — C3492 Malignant neoplasm of unspecified part of left bronchus or lung: Secondary | ICD-10-CM

## 2015-11-24 DIAGNOSIS — Z5111 Encounter for antineoplastic chemotherapy: Secondary | ICD-10-CM

## 2015-11-24 LAB — COMPREHENSIVE METABOLIC PANEL
ALT: 25 U/L (ref 0–55)
AST: 18 U/L (ref 5–34)
Albumin: 3.2 g/dL — ABNORMAL LOW (ref 3.5–5.0)
Alkaline Phosphatase: 85 U/L (ref 40–150)
Anion Gap: 9 mEq/L (ref 3–11)
BUN: 14.2 mg/dL (ref 7.0–26.0)
CALCIUM: 9.9 mg/dL (ref 8.4–10.4)
CHLORIDE: 106 meq/L (ref 98–109)
CO2: 25 meq/L (ref 22–29)
CREATININE: 0.7 mg/dL (ref 0.6–1.1)
EGFR: >90 mL/min/{1.73_m2} (ref >90)
GLUCOSE: 109 mg/dL (ref 70–140)
Potassium: 4.3 mEq/L (ref 3.5–5.1)
SODIUM: 141 meq/L (ref 136–145)
Total Bilirubin: <0.3 mg/dL (ref 0.20–1.20)
Total Protein: 7 g/dL (ref 6.4–8.3)

## 2015-11-24 LAB — CBC WITH DIFFERENTIAL/PLATELET
BASO%: 0.4 % (ref 0.0–2.0)
Basophils Absolute: 0.1 10*3/uL (ref 0.0–0.1)
EOS%: 1.5 % (ref 0.0–7.0)
Eosinophils Absolute: 0.2 10*3/uL (ref 0.0–0.5)
HEMATOCRIT: 39.4 % (ref 34.8–46.6)
HGB: 13.4 g/dL (ref 11.6–15.9)
LYMPH#: 1.5 10*3/uL (ref 0.9–3.3)
LYMPH%: 11.5 % — ABNORMAL LOW (ref 14.0–49.7)
MCH: 31.4 pg (ref 25.1–34.0)
MCHC: 34 g/dL (ref 31.5–36.0)
MCV: 92.3 fL (ref 79.5–101.0)
MONO#: 1 10*3/uL — ABNORMAL HIGH (ref 0.1–0.9)
MONO%: 7.3 % (ref 0.0–14.0)
NEUT#: 10.6 10*3/uL — ABNORMAL HIGH (ref 1.5–6.5)
NEUT%: 79.3 % — AB (ref 38.4–76.8)
Platelets: 325 10*3/uL (ref 145–400)
RBC: 4.27 10*6/uL (ref 3.70–5.45)
RDW: 13.1 % (ref 11.2–14.5)
WBC: 13.4 10*3/uL — ABNORMAL HIGH (ref 3.9–10.3)

## 2015-11-24 LAB — ALPHA-1-ANTITRYPSIN: A1 ANTITRYPSIN SER: 209 mg/dL — AB (ref 83–199)

## 2015-11-24 LAB — MAGNESIUM: MAGNESIUM: 2.2 mg/dL (ref 1.5–2.5)

## 2015-11-24 MED ORDER — SODIUM CHLORIDE 0.9 % IV SOLN
Freq: Once | INTRAVENOUS | Status: AC
  Administered 2015-11-24: 13:00:00 via INTRAVENOUS
  Filled 2015-11-24: qty 5

## 2015-11-24 MED ORDER — SODIUM CHLORIDE 0.9 % IV SOLN
Freq: Once | INTRAVENOUS | Status: AC
  Administered 2015-11-24: 11:00:00 via INTRAVENOUS

## 2015-11-24 MED ORDER — PALONOSETRON HCL INJECTION 0.25 MG/5ML
INTRAVENOUS | Status: AC
  Filled 2015-11-24: qty 5

## 2015-11-24 MED ORDER — PALONOSETRON HCL INJECTION 0.25 MG/5ML
0.2500 mg | Freq: Once | INTRAVENOUS | Status: AC
  Administered 2015-11-24: 0.25 mg via INTRAVENOUS

## 2015-11-24 MED ORDER — POTASSIUM CHLORIDE 2 MEQ/ML IV SOLN
Freq: Once | INTRAVENOUS | Status: AC
  Administered 2015-11-24: 11:00:00 via INTRAVENOUS
  Filled 2015-11-24: qty 10

## 2015-11-24 MED ORDER — CISPLATIN CHEMO INJECTION 100MG/100ML
60.0000 mg/m2 | Freq: Once | INTRAVENOUS | Status: AC
  Administered 2015-11-24: 140 mg via INTRAVENOUS
  Filled 2015-11-24: qty 100

## 2015-11-24 MED ORDER — SODIUM CHLORIDE 0.9 % IV SOLN
120.0000 mg/m2 | Freq: Once | INTRAVENOUS | Status: AC
  Administered 2015-11-24: 280 mg via INTRAVENOUS
  Filled 2015-11-24: qty 14

## 2015-11-24 NOTE — Patient Instructions (Signed)
[facility] Discharge Instructions for Patients Receiving Chemotherapy  Today you received the following chemotherapy agents Cisplatin and Etoposide  To help prevent nausea and vomiting after your treatment, we encourage you to take your nausea medication as prescribed   If you develop nausea and vomiting that is not controlled by your nausea medication, call the clinic.   BELOW ARE SYMPTOMS THAT SHOULD BE REPORTED IMMEDIATELY:  *FEVER GREATER THAN 100.5 F  *CHILLS WITH OR WITHOUT FEVER  NAUSEA AND VOMITING THAT IS NOT CONTROLLED WITH YOUR NAUSEA MEDICATION  *UNUSUAL SHORTNESS OF BREATH  *UNUSUAL BRUISING OR BLEEDING  TENDERNESS IN MOUTH AND THROAT WITH OR WITHOUT PRESENCE OF ULCERS  *URINARY PROBLEMS  *BOWEL PROBLEMS  UNUSUAL RASH Items with * indicate a potential emergency and should be followed up as soon as possible.  Feel free to call the clinic you have any questions or concerns. The clinic phone number is [phone].  Please show the [facility] at check-in to the Emergency Department and triage nurse.   Cisplatin injection What is this medicine? CISPLATIN (SIS pla tin) is a chemotherapy drug. It targets fast dividing cells, like cancer cells, and causes these cells to die. This medicine is used to treat many types of cancer like bladder, ovarian, and testicular cancers. This medicine may be used for other purposes; ask your health care provider or pharmacist if you have questions. What should I tell my health care provider before I take this medicine? They need to know if you have any of these conditions: -blood disorders -hearing problems -kidney disease -recent or ongoing radiation therapy -an unusual or allergic reaction to cisplatin, carboplatin, other chemotherapy, other medicines, foods, dyes, or preservatives -pregnant or trying to get pregnant -breast-feeding How should I use this medicine? This drug is given as an infusion into  a vein. It is administered in a hospital or clinic by a specially trained health care professional. Talk to your pediatrician regarding the use of this medicine in children. Special care may be needed. Overdosage: If you think you have taken too much of this medicine contact a poison control center or emergency room at once. NOTE: This medicine is only for you. Do not share this medicine with others. What if I miss a dose? It is important not to miss a dose. Call your doctor or health care professional if you are unable to keep an appointment. What may interact with this medicine? -dofetilide -foscarnet -medicines for seizures -medicines to increase blood counts like filgrastim, pegfilgrastim, sargramostim -probenecid -pyridoxine used with altretamine -rituximab -some antibiotics like amikacin, gentamicin, neomycin, polymyxin B, streptomycin, tobramycin -sulfinpyrazone -vaccines -zalcitabine Talk to your doctor or health care professional before taking any of these medicines: -acetaminophen -aspirin -ibuprofen -ketoprofen -naproxen This list may not describe all possible interactions. Give your health care provider a list of all the medicines, herbs, non-prescription drugs, or dietary supplements you use. Also tell them if you smoke, drink alcohol, or use illegal drugs. Some items may interact with your medicine. What should I watch for while using this medicine? Your condition will be monitored carefully while you are receiving this medicine. You will need important blood work done while you are taking this medicine. This drug may make you feel generally unwell. This is not uncommon, as chemotherapy can affect healthy cells as well as cancer cells. Report any side effects. Continue your course of treatment even though you feel ill unless your doctor tells you to stop. In some cases, you  may be given additional medicines to help with side effects. Follow all directions for their  use. Call your doctor or health care professional for advice if you get a fever, chills or sore throat, or other symptoms of a cold or flu. Do not treat yourself. This drug decreases your body's ability to fight infections. Try to avoid being around people who are sick. This medicine may increase your risk to bruise or bleed. Call your doctor or health care professional if you notice any unusual bleeding. Be careful brushing and flossing your teeth or using a toothpick because you may get an infection or bleed more easily. If you have any dental work done, tell your dentist you are receiving this medicine. Avoid taking products that contain aspirin, acetaminophen, ibuprofen, naproxen, or ketoprofen unless instructed by your doctor. These medicines may hide a fever. Do not become pregnant while taking this medicine. Women should inform their doctor if they wish to become pregnant or think they might be pregnant. There is a potential for serious side effects to an unborn child. Talk to your health care professional or pharmacist for more information. Do not breast-feed an infant while taking this medicine. Drink fluids as directed while you are taking this medicine. This will help protect your kidneys. Call your doctor or health care professional if you get diarrhea. Do not treat yourself. What side effects may I notice from receiving this medicine? Side effects that you should report to your doctor or health care professional as soon as possible: -allergic reactions like skin rash, itching or hives, swelling of the face, lips, or tongue -signs of infection - fever or chills, cough, sore throat, pain or difficulty passing urine -signs of decreased platelets or bleeding - bruising, pinpoint red spots on the skin, black, tarry stools, nosebleeds -signs of decreased red blood cells - unusually weak or tired, fainting spells, lightheadedness -breathing problems -changes in hearing -gout pain -low blood  counts - This drug may decrease the number of white blood cells, red blood cells and platelets. You may be at increased risk for infections and bleeding. -nausea and vomiting -pain, swelling, redness or irritation at the injection site -pain, tingling, numbness in the hands or feet -problems with balance, movement -trouble passing urine or change in the amount of urine Side effects that usually do not require medical attention (report to your doctor or health care professional if they continue or are bothersome): -changes in vision -loss of appetite -metallic taste in the mouth or changes in taste This list may not describe all possible side effects. Call your doctor for medical advice about side effects. You may report side effects to FDA at 1-800-FDA-1088. Where should I keep my medicine? This drug is given in a hospital or clinic and will not be stored at home. NOTE: This sheet is a summary. It may not cover all possible information. If you have questions about this medicine, talk to your doctor, pharmacist, or health care provider.    2016, Elsevier/Gold Standard. (2007-07-18 14:40:54)  Etoposide, VP-16 capsules What is this medicine? ETOPOSIDE, VP-16 (e toe POE side) is a chemotherapy drug. It is used to treat small cell lung cancer and other cancers. This medicine may be used for other purposes; ask your health care provider or pharmacist if you have questions. What should I tell my health care provider before I take this medicine? They need to know if you have any of these conditions: -infection -kidney disease -low blood counts, like low white  cell, platelet, or red cell counts -an unusual or allergic reaction to etoposide, other chemotherapeutic agents, other medicines, foods, dyes, or preservatives -pregnant or trying to get pregnant -breast-feeding How should I use this medicine? Take this medicine by mouth with a glass of water. Follow the directions on the prescription  label. Do not open, crush, or chew the capsules. It is advisable to wear gloves when handling this medicine. Take your medicine at regular intervals. Do not take it more often than directed. Do not stop taking except on your doctor's advice. Talk to your pediatrician regarding the use of this medicine in children. Special care may be needed. Overdosage: If you think you have taken too much of this medicine contact a poison control center or emergency room at once. NOTE: This medicine is only for you. Do not share this medicine with others. What if I miss a dose? If you miss a dose, take it as soon as you can. If it is almost time for your next dose, take only that dose. Do not take double or extra doses. What may interact with this medicine? -aspirin -certain medications for seizures like carbamazepine, phenobarbital, phenytoin, valproic acid -cyclosporine -levamisole -valproic acid -warfarin This list may not describe all possible interactions. Give your health care provider a list of all the medicines, herbs, non-prescription drugs, or dietary supplements you use. Also tell them if you smoke, drink alcohol, or use illegal drugs. Some items may interact with your medicine. What should I watch for while using this medicine? Visit your doctor for checks on your progress. This drug may make you feel generally unwell. This is not uncommon, as chemotherapy can affect healthy cells as well as cancer cells. Report any side effects. Continue your course of treatment even though you feel ill unless your doctor tells you to stop. In some cases, you may be given additional medicines to help with side effects. Follow all directions for their use. Call your doctor or health care professional for advice if you get a fever, chills or sore throat, or other symptoms of a cold or flu. Do not treat yourself. This drug decreases your body's ability to fight infections. Try to avoid being around people who are  sick. This medicine may increase your risk to bruise or bleed. Call your doctor or health care professional if you notice any unusual bleeding. Be careful brushing and flossing your teeth or using a toothpick because you may get an infection or bleed more easily. If you have any dental work done, tell your dentist you are receiving this medicine. Avoid taking products that contain aspirin, acetaminophen, ibuprofen, naproxen, or ketoprofen unless instructed by your doctor. These medicines may hide a fever. Do not become pregnant while taking this medicine or for at least 6 months after stopping it. Women should inform their doctor if they wish to become pregnant or think they might be pregnant. Women of child-bearing potential will need to have a negative pregnancy test before starting this medicine. There is a potential for serious side effects to an unborn child. Talk to your health care professional or pharmacist for more information. Do not breast-feed an infant while taking this medicine. Men must use a latex condom during sexual contact with a woman while taking this medicine and for at least 4 months after stopping it. A latex condom is needed even if you have had a vasectomy. Contact your doctor right away if your partner becomes pregnant. Do not donate sperm while taking this  medicine and for 4 months after you stop taking this medicine. Men should inform their doctors if they wish to father a child. This medicine may lower sperm counts. What side effects may I notice from receiving this medicine? Side effects that you should report to your doctor or health care professional as soon as possible: -allergic reactions like skin rash, itching or hives, swelling of the face, lips, or tongue -low blood counts - this medicine may decrease the number of white blood cells, red blood cells and platelets. You may be at increased risk for infections and bleeding. -signs of infection - fever or chills, cough,  sore throat, pain or difficulty passing urine -signs of decreased platelets or bleeding - bruising, pinpoint red spots on the skin, black, tarry stools, blood in the urine -signs of decreased red blood cells - unusually weak or tired, fainting spells, lightheadedness -breathing problems -changes in vision -mouth or throat sores or ulcers -pain, tingling, numbness in the hands or feet -redness, blistering, peeling or loosening of the skin, including inside the mouth -seizures -vomiting Side effects that usually do not require medical attention (report to your doctor or health care professional if they continue or are bothersome): -change in taste -diarrhea -hair loss -nausea -stomach pain This list may not describe all possible side effects. Call your doctor for medical advice about side effects. You may report side effects to FDA at 1-800-FDA-1088. Where should I keep my medicine? Keep out of the reach of children. Store in a refrigerator between 2 and 8 degrees C (36 and 46 degrees F). Do not freeze. Throw away any unused medicine after the expiration date. NOTE: This sheet is a summary. It may not cover all possible information. If you have questions about this medicine, talk to your doctor, pharmacist, or health care provider.    2016, Elsevier/Gold Standard. (2013-12-06 44:69:50)

## 2015-11-25 ENCOUNTER — Ambulatory Visit (HOSPITAL_BASED_OUTPATIENT_CLINIC_OR_DEPARTMENT_OTHER): Payer: 59

## 2015-11-25 VITALS — BP 123/55 | HR 69 | Temp 98.2°F | Resp 18

## 2015-11-25 DIAGNOSIS — C3492 Malignant neoplasm of unspecified part of left bronchus or lung: Secondary | ICD-10-CM

## 2015-11-25 DIAGNOSIS — Z5112 Encounter for antineoplastic immunotherapy: Secondary | ICD-10-CM | POA: Diagnosis not present

## 2015-11-25 MED ORDER — SODIUM CHLORIDE 0.9 % IV SOLN
Freq: Once | INTRAVENOUS | Status: AC
  Administered 2015-11-25: 15:00:00 via INTRAVENOUS

## 2015-11-25 MED ORDER — ETOPOSIDE CHEMO INJECTION 1 GM/50ML
120.0000 mg/m2 | Freq: Once | INTRAVENOUS | Status: AC
  Administered 2015-11-25: 280 mg via INTRAVENOUS
  Filled 2015-11-25: qty 14

## 2015-11-25 MED ORDER — SODIUM CHLORIDE 0.9 % IV SOLN
10.0000 mg | Freq: Once | INTRAVENOUS | Status: AC
  Administered 2015-11-25: 10 mg via INTRAVENOUS
  Filled 2015-11-25: qty 1

## 2015-11-25 NOTE — Patient Instructions (Signed)
[facility] Discharge Instructions for Patients Receiving Chemotherapy  Today you received the following chemotherapy agents Etoposide.   To help prevent nausea and vomiting after your treatment, we encourage you to take your nausea medication as prescribed.   If you develop nausea and vomiting that is not controlled by your nausea medication, call the clinic.   BELOW ARE SYMPTOMS THAT SHOULD BE REPORTED IMMEDIATELY:  *FEVER GREATER THAN 100.5 F  *CHILLS WITH OR WITHOUT FEVER  NAUSEA AND VOMITING THAT IS NOT CONTROLLED WITH YOUR NAUSEA MEDICATION  *UNUSUAL SHORTNESS OF BREATH  *UNUSUAL BRUISING OR BLEEDING  TENDERNESS IN MOUTH AND THROAT WITH OR WITHOUT PRESENCE OF ULCERS  *URINARY PROBLEMS  *BOWEL PROBLEMS  UNUSUAL RASH Items with * indicate a potential emergency and should be followed up as soon as possible.  Feel free to call the clinic you have any questions or concerns. The clinic phone number is [phone].  Please show the CHEMO ALERT CARD at check-in to the Emergency Department and triage nurse.   

## 2015-11-26 ENCOUNTER — Ambulatory Visit (HOSPITAL_BASED_OUTPATIENT_CLINIC_OR_DEPARTMENT_OTHER): Payer: 59

## 2015-11-26 VITALS — BP 142/72 | HR 59 | Temp 98.1°F | Resp 18

## 2015-11-26 DIAGNOSIS — C3492 Malignant neoplasm of unspecified part of left bronchus or lung: Secondary | ICD-10-CM | POA: Diagnosis not present

## 2015-11-26 DIAGNOSIS — Z5111 Encounter for antineoplastic chemotherapy: Secondary | ICD-10-CM | POA: Diagnosis not present

## 2015-11-26 MED ORDER — SODIUM CHLORIDE 0.9 % IV SOLN
Freq: Once | INTRAVENOUS | Status: AC
  Administered 2015-11-26: 15:00:00 via INTRAVENOUS

## 2015-11-26 MED ORDER — SODIUM CHLORIDE 0.9 % IV SOLN
10.0000 mg | Freq: Once | INTRAVENOUS | Status: AC
  Administered 2015-11-26: 10 mg via INTRAVENOUS
  Filled 2015-11-26: qty 1

## 2015-11-26 MED ORDER — SODIUM CHLORIDE 0.9 % IV SOLN
120.0000 mg/m2 | Freq: Once | INTRAVENOUS | Status: AC
  Administered 2015-11-26: 280 mg via INTRAVENOUS
  Filled 2015-11-26: qty 14

## 2015-11-26 NOTE — Progress Notes (Signed)
Patient complains of itching on the external portion of her genital area for nine years. Patient states she noticed more itching on the external portion of her genital area after chemotherapy on Monday. Patient states she didn't sleep well last night due to the itching. Patient states she is unable to visualize the area. Patient states she wants to try and eat yogurt and see if this helps. Selena Lesser, NP notified. Patient is to follow up with her Gyn and temporarily use monistat cream mixed with a small amount of hydrocortisone cream at night for temporary relief. Patient verbalizes understanding of above mentioned plan.   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

## 2015-11-26 NOTE — Patient Instructions (Signed)
[facility] Discharge Instructions for Patients Receiving Chemotherapy  Today you received the following chemotherapy agents Etoposide (VP 16) To help prevent nausea and vomiting after your treatment, we encourage you to take your nausea medication as prescribed.If you develop nausea and vomiting that is not controlled by your nausea medication, call the clinic.   BELOW ARE SYMPTOMS THAT SHOULD BE REPORTED IMMEDIATELY:  *FEVER GREATER THAN 100.5 F  *CHILLS WITH OR WITHOUT FEVER  NAUSEA AND VOMITING THAT IS NOT CONTROLLED WITH YOUR NAUSEA MEDICATION  *UNUSUAL SHORTNESS OF BREATH  *UNUSUAL BRUISING OR BLEEDING  TENDERNESS IN MOUTH AND THROAT WITH OR WITHOUT PRESENCE OF ULCERS  *URINARY PROBLEMS  *BOWEL PROBLEMS  UNUSUAL RASH Items with * indicate a potential emergency and should be followed up as soon as possible.  Feel free to call the clinic you have any questions or concerns. The clinic phone number is [phone].  Please show the CHEMO ALERT CARD at check-in to the Emergency Department and triage nurse. 

## 2015-11-27 ENCOUNTER — Telehealth: Payer: Self-pay | Admitting: *Deleted

## 2015-11-27 NOTE — Telephone Encounter (Signed)
TC to patient after 1st time chemo-Cisplatin and Etoposide. Spoke with patient. She states she is doing well, drinking fluids as directed. Denies fever, chills, nausea, vomiting, diarrhea. Will be coming in tomorrow for her Neulasta injection

## 2015-11-28 ENCOUNTER — Ambulatory Visit (HOSPITAL_BASED_OUTPATIENT_CLINIC_OR_DEPARTMENT_OTHER): Payer: 59

## 2015-11-28 VITALS — BP 131/66 | HR 77 | Temp 98.3°F | Resp 20

## 2015-11-28 DIAGNOSIS — Z5189 Encounter for other specified aftercare: Secondary | ICD-10-CM

## 2015-11-28 DIAGNOSIS — C3492 Malignant neoplasm of unspecified part of left bronchus or lung: Secondary | ICD-10-CM | POA: Diagnosis not present

## 2015-11-28 MED ORDER — PEGFILGRASTIM INJECTION 6 MG/0.6ML ~~LOC~~
6.0000 mg | PREFILLED_SYRINGE | Freq: Once | SUBCUTANEOUS | Status: AC
  Administered 2015-11-28: 6 mg via SUBCUTANEOUS
  Filled 2015-11-28: qty 0.6

## 2015-11-28 NOTE — Patient Instructions (Signed)
Pegfilgrastim injection What is this medicine? PEGFILGRASTIM (PEG fil gra stim) is a long-acting granulocyte colony-stimulating factor that stimulates the growth of neutrophils, a type of white blood cell important in the body's fight against infection. It is used to reduce the incidence of fever and infection in patients with certain types of cancer who are receiving chemotherapy that affects the bone marrow, and to increase survival after being exposed to high doses of radiation. This medicine may be used for other purposes; ask your health care provider or pharmacist if you have questions. What should I tell my health care provider before I take this medicine? They need to know if you have any of these conditions: -kidney disease -latex allergy -ongoing radiation therapy -sickle cell disease -skin reactions to acrylic adhesives (On-Body Injector only) -an unusual or allergic reaction to pegfilgrastim, filgrastim, other medicines, foods, dyes, or preservatives -pregnant or trying to get pregnant -breast-feeding How should I use this medicine? This medicine is for injection under the skin. If you get this medicine at home, you will be taught how to prepare and give the pre-filled syringe or how to use the On-body Injector. Refer to the patient Instructions for Use for detailed instructions. Use exactly as directed. Take your medicine at regular intervals. Do not take your medicine more often than directed. It is important that you put your used needles and syringes in a special sharps container. Do not put them in a trash can. If you do not have a sharps container, call your pharmacist or healthcare provider to get one. Talk to your pediatrician regarding the use of this medicine in children. While this drug may be prescribed for selected conditions, precautions do apply. Overdosage: If you think you have taken too much of this medicine contact a poison control center or emergency room at  once. NOTE: This medicine is only for you. Do not share this medicine with others. What if I miss a dose? It is important not to miss your dose. Call your doctor or health care professional if you miss your dose. If you miss a dose due to an On-body Injector failure or leakage, a new dose should be administered as soon as possible using a single prefilled syringe for manual use. What may interact with this medicine? Interactions have not been studied. Give your health care provider a list of all the medicines, herbs, non-prescription drugs, or dietary supplements you use. Also tell them if you smoke, drink alcohol, or use illegal drugs. Some items may interact with your medicine. This list may not describe all possible interactions. Give your health care provider a list of all the medicines, herbs, non-prescription drugs, or dietary supplements you use. Also tell them if you smoke, drink alcohol, or use illegal drugs. Some items may interact with your medicine. What should I watch for while using this medicine? You may need blood work done while you are taking this medicine. If you are going to need a MRI, CT scan, or other procedure, tell your doctor that you are using this medicine (On-Body Injector only). What side effects may I notice from receiving this medicine? Side effects that you should report to your doctor or health care professional as soon as possible: -allergic reactions like skin rash, itching or hives, swelling of the face, lips, or tongue -dizziness -fever -pain, redness, or irritation at site where injected -pinpoint red spots on the skin -red or dark-brown urine -shortness of breath or breathing problems -stomach or side pain, or pain   at the shoulder -swelling -tiredness -trouble passing urine or change in the amount of urine Side effects that usually do not require medical attention (report to your doctor or health care professional if they continue or are  bothersome): -bone pain -muscle pain This list may not describe all possible side effects. Call your doctor for medical advice about side effects. You may report side effects to FDA at 1-800-FDA-1088. Where should I keep my medicine? Keep out of the reach of children. Store pre-filled syringes in a refrigerator between 2 and 8 degrees C (36 and 46 degrees F). Do not freeze. Keep in carton to protect from light. Throw away this medicine if it is left out of the refrigerator for more than 48 hours. Throw away any unused medicine after the expiration date. NOTE: This sheet is a summary. It may not cover all possible information. If you have questions about this medicine, talk to your doctor, pharmacist, or health care provider.    2016, [employer]vier/[employer] Standard. (2014-05-02 14:30:14)  

## 2015-12-02 ENCOUNTER — Telehealth: Payer: Self-pay | Admitting: Internal Medicine

## 2015-12-02 ENCOUNTER — Telehealth: Payer: Self-pay | Admitting: *Deleted

## 2015-12-02 ENCOUNTER — Ambulatory Visit (HOSPITAL_BASED_OUTPATIENT_CLINIC_OR_DEPARTMENT_OTHER): Payer: 59 | Admitting: Internal Medicine

## 2015-12-02 ENCOUNTER — Encounter: Payer: Self-pay | Admitting: Internal Medicine

## 2015-12-02 VITALS — BP 128/91 | HR 77 | Temp 98.0°F | Resp 18 | Ht 66.0 in | Wt 254.7 lb

## 2015-12-02 DIAGNOSIS — C3412 Malignant neoplasm of upper lobe, left bronchus or lung: Secondary | ICD-10-CM

## 2015-12-02 DIAGNOSIS — Z853 Personal history of malignant neoplasm of breast: Secondary | ICD-10-CM

## 2015-12-02 DIAGNOSIS — C3492 Malignant neoplasm of unspecified part of left bronchus or lung: Secondary | ICD-10-CM

## 2015-12-02 NOTE — Telephone Encounter (Signed)
Per staff phone call and POF I have schedueld appts. Scheduler advised of appts.  JMW  

## 2015-12-02 NOTE — Progress Notes (Signed)
[place] [phone]   [phone]  OFFICE PROGRESS NOTE  [name], [place] [address]  DIAGNOSIS: limited stage (T2b, N2, M0) small cell lung cancer, pending further staging workup diagnosed in July 2017 and presented with large left upper lobe lung mass and mediastinal lymphadenopathy.  PRIOR THERAPY: None.  CURRENT THERAPY: Systemic chemotherapy with cisplatin 60 MG/M2 on day 1 and etoposide 120 MG/M2 on days 1, 2 and 3.   INTERVAL HISTORY: Sharon [name] 60 y.o. female returns to the clinic today for follow-up visit. The patient started systemic chemotherapy with cisplatin and etoposide last week and tolerated the first cycle of her treatment well. She denied having any significant nausea or vomiting. She has no fever or chills. The patient denied having any significant chest pain but continues to have mild shortness breath with exertion with no cough or hemoptysis. She is here today for evaluation with repeat blood work. She had imaging studies including PET scan and MRI of the brain performed recently and she is here also for evaluation and discussion of her scan results.  MEDICAL HISTORY: Past Medical History:  Diagnosis Date  . Cancer of right breast ([facility]) 2009  . CAP (community acquired pneumonia) 11/06/2015  . COPD (chronic obstructive pulmonary disease) ([facility])   . Elevated blood pressure   . Hoarseness of voice    "for the last 2 months" (11/06/2015)  . Lung mass dx'd 10/2015  . Menopause   . Pre-diabetes   . Small cell lung cancer ([facility])   . Vitamin D deficiency     ALLERGIES:  has No Known Allergies.  MEDICATIONS:  Current Outpatient Prescriptions  Medication Sig Dispense Refill  . albuterol (PROVENTIL HFA;VENTOLIN HFA) 108 (90 Base) MCG/ACT inhaler Inhale 1 puff into the lungs every 6 (six) hours as needed for wheezing or shortness of breath.    . budesonide-formoterol (SYMBICORT)  160-4.5 MCG/ACT inhaler Inhale 2 puffs into the lungs 2 (two) times daily.    . fluticasone (FLONASE) 50 MCG/ACT nasal spray Place into the nose.    . prochlorperazine (COMPAZINE) 10 MG tablet Take 1 tablet (10 mg total) by mouth every 6 (six) hours as needed for nausea or vomiting. 30 tablet 0   No current facility-administered medications for this visit.     SURGICAL HISTORY:  Past Surgical History:  Procedure Laterality Date  . BREAST BIOPSY Right 2009  . BREAST LUMPECTOMY Right 2009  . UTERINE FIBROID SURGERY  2000  . VAGINAL HYSTERECTOMY  2000  . VESICOVAGINAL FISTULA CLOSURE W/ TAH  2000  . VIDEO BRONCHOSCOPY WITH ENDOBRONCHIAL ULTRASOUND N/A 11/10/2015   Procedure: VIDEO BRONCHOSCOPY WITH ENDOBRONCHIAL ULTRASOUND;  Surgeon: Javier Glazier, MD;  Location: [facility];  Service: Thoracic;  Laterality: N/A;    REVIEW OF SYSTEMS:  A comprehensive review of systems was negative except for: Respiratory: positive for dyspnea on exertion   PHYSICAL EXAMINATION: General appearance: alert, cooperative and no distress Head: Normocephalic, without obvious abnormality, atraumatic Neck: no adenopathy, no JVD, supple, symmetrical, trachea midline and thyroid not enlarged, symmetric, no tenderness/mass/nodules Lymph nodes: Cervical, supraclavicular, and axillary nodes normal. Resp: clear to auscultation bilaterally Back: symmetric, no curvature. ROM normal. No CVA tenderness. Cardio: regular rate and rhythm, S1, S2 normal, no murmur, click, rub or gallop GI: soft, non-tender; bowel sounds normal; no masses,  no organomegaly Extremities: extremities normal, atraumatic, no cyanosis or edema  ECOG PERFORMANCE STATUS: 1 - Symptomatic  but completely ambulatory  Blood pressure (!) 128/91, pulse 77, temperature 98 F (36.7 C), temperature source Oral, resp. rate 18, height '5\' 6"'$  (1.676 m), weight 254 lb 11.2 oz (115.5 kg), SpO2 97 %.  LABORATORY DATA: Lab Results  Component Value Date   WBC 13.4  (H) 11/24/2015   HGB 13.4 11/24/2015   HCT 39.4 11/24/2015   MCV 92.3 11/24/2015   PLT 325 11/24/2015      Chemistry      Component Value Date/Time   NA 141 11/24/2015 0946   K 4.3 11/24/2015 0946   CL 102 11/07/2015 0140   CL 104 07/18/2012 1451   CO2 25 11/24/2015 0946   BUN 14.2 11/24/2015 0946   CREATININE 0.7 11/24/2015 0946      Component Value Date/Time   CALCIUM 9.9 11/24/2015 0946   ALKPHOS 85 11/24/2015 0946   AST 18 11/24/2015 0946   ALT 25 11/24/2015 0946   BILITOT <0.30 11/24/2015 0946       RADIOGRAPHIC STUDIES: Dg Chest 2 View  Result Date: 11/06/2015 CLINICAL DATA:  Recurrent pneumonia for almost 2 months. By report had a CT scan 2 days ago showing pneumonia and a new mass. EXAM: CHEST  2 VIEW COMPARISON:  Chest x-ray 08/18/2011. FINDINGS: Left upper lobe airspace opacity tracks into the hilum. Right lung is clear. The cardiopericardial silhouette is within normal limits for size. The visualized bony structures of the thorax are intact. IMPRESSION: Left upper lobe airspace disease tracking centrally into the left hilum. Central obstructing neoplasm could have this appearance. Comparison to the recent CT scan is recommended. Electronically Signed   By: Misty Stanley M.D.   On: 11/06/2015 15:49   Mr Jeri Cos YP Contrast  Result Date: 11/19/2015 CLINICAL DATA:  New diagnosis of small cell lung cancer.  Staging. EXAM: MRI HEAD WITHOUT AND WITH CONTRAST TECHNIQUE: Multiplanar, multiecho pulse sequences of the brain and surrounding structures were obtained without and with intravenous contrast. CONTRAST:  51m MULTIHANCE GADOBENATE DIMEGLUMINE 529 MG/ML IV SOLN COMPARISON:  None. FINDINGS: No evidence for acute infarction, hemorrhage, mass lesion, hydrocephalus, or extra-axial fluid. Normal for age cerebral volume. Mild subcortical and periventricular T2 and FLAIR hyperintensities, likely chronic microvascular ischemic change. Flow voids are maintained. Small foci of  chronic hemorrhage can be seen in the subcortical white matter of the RIGHT parietal lobe and LEFT superior temporal lobe. Pituitary, pineal, and cerebellar tonsils unremarkable. No upper cervical lesions. Post infusion, no abnormal enhancement of the brain or meninges. Extracranial soft tissues unremarkable. IMPRESSION: Mild subcortical and periventricular T2 and FLAIR hyperintensities, likely chronic microvascular ischemic change. 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 No intracranial metastatic disease is observed. Electronically Signed   By: JStaci RighterM.D.   On: 11/19/2015 13:35  Nm Pet Image Initial (pi) Skull Base To Thigh  Result Date: 11/19/2015 CLINICAL DATA:  Initial treatment strategy for small cell lung cancer. EXAM: NUCLEAR MEDICINE PET SKULL BASE TO THIGH TECHNIQUE: 12.8 mCi F-18 FDG was injected intravenously. Full-ring PET imaging was performed from the skull base to thigh after the radiotracer. CT data was obtained and used for attenuation correction and anatomic localization. FASTING BLOOD GLUCOSE:  Value: 97 mg/dl COMPARISON:  None. FINDINGS: NECK No hypermetabolic lymph nodes in the neck. CT images show no acute findings. CHEST Hypermetabolic bilateral supraclavicular adenopathy measures up to 1.8 cm in short axis on the right with an SUV max of 20.1. Hypermetabolic bilateral  mediastinal and subcarinal adenopathy with an index mid right paratracheal lymph node measuring 1.3 cm (CT image 57) and an SUV max of 23.2. Left upper lobe mass measures approximately 4.8 x 5.2 cm with an SUV max of 24.5. Surrounding ground-glass and septal thickening in the left upper lobe. Soft tissue thickening extends anteriorly along the left heart border with a 1.9 cm nodule in the medial lingula, also hypermetabolic. Tiny left pleural effusion shows mild hypermetabolism. No pericardial effusion. ABDOMEN/PELVIS No abnormal  hypermetabolism in the liver or right adrenal gland. There is focal hypermetabolism in the body of the left adrenal gland, SUV max 5.4, without a definite CT correlate. No abnormal hypermetabolism in the spleen or pancreas. No hypermetabolic lymph nodes. Liver is decreased in attenuation diffusely. Fluid density nodule in the body of the right adrenal gland measures 1.8 cm. Kidneys, spleen, pancreas, stomach and bowel are grossly unremarkable. SKELETON No abnormal osseous hypermetabolism. IMPRESSION: 1. Hypermetabolic left upper lobe mass, bilateral mediastinal, subcarinal and bilateral supraclavicular adenopathy with a likely malignant left pleural effusion. Findings are worrisome for stage IV primary bronchogenic carcinoma. 2. Hypermetabolism within the body of the left adrenal gland without a definite CT correlate. Metastatic disease cannot be excluded. 3. Hepatic steatosis. 4. Right adrenal adenoma. Electronically Signed   By: Lorin Picket M.D.   On: 11/19/2015 12:54  Dg Chest Portable 1 View  Result Date: 11/10/2015 CLINICAL DATA:  Status post bronchoscopy EXAM: PORTABLE CHEST 1 VIEW COMPARISON:  11/06/2015 chest radiograph. FINDINGS: Stable cardiomediastinal silhouette with top-normal heart size. No pneumothorax. No pleural effusion. Stable masslike opacity in the left upper parahilar lung. IMPRESSION: No pneumothorax. Stable masslike opacity in the left upper parahilar lung. Electronically Signed   By: Ilona Sorrel M.D.   On: 11/10/2015 14:12    ASSESSMENT AND PLAN: This is a very pleasant 60 years old white female recently diagnosed with small cell lung cancer likely extensive stage disease with bilateral cervical lymphadenopathy and questionable adrenal lesion. Seen on the recent PET scan. MRI of the brain was unremarkable for any disease metastasis to the brain. The patient is currently undergoing systemic chemotherapy with cisplatin and etoposide status post 1 cycle. She tolerated the first  week of her treatment fairly well with no significant adverse effects. I recommended for the patient to continue her treatment as scheduled. She is expected to start cycle #2 on 12/16/2015. She would come back for follow-up visit at that time. The patient was advised to call immediately if she has any concerning symptoms in the interval.  The patient voices understanding of current disease status and treatment options and is in agreement with the current care plan.  All questions were answered. The patient knows to call the clinic with any problems, questions or concerns. We can certainly see the patient much sooner if necessary.   Disclaimer: This note was dictated with voice recognition software. Similar sounding words can inadvertently be transcribed and may not be corrected upon review.

## 2015-12-02 NOTE — Patient Instructions (Signed)
Smoking Cessation, Tips for Success If you are ready to quit smoking, congratulations! You have chosen to help yourself be healthier. Cigarettes bring nicotine, tar, carbon monoxide, and other irritants into your body. Your lungs, heart, and blood vessels will be able to work better without these poisons. There are many different ways to quit smoking. Nicotine gum, nicotine patches, a nicotine inhaler, or nicotine nasal spray can help with physical craving. Hypnosis, support groups, and medicines help break the habit of smoking. WHAT THINGS CAN I DO TO MAKE QUITTING EASIER?  Here are some tips to help you quit for good:  Pick a date when you will quit smoking completely. Tell all of your friends and family about your plan to quit on that date.  Do not try to slowly cut down on the number of cigarettes you are smoking. Pick a quit date and quit smoking completely starting on that day.  Throw away all cigarettes.   Clean and remove all ashtrays from your home, work, and car.  On a card, write down your reasons for quitting. Carry the card with you and read it when you get the urge to smoke.  Cleanse your body of nicotine. Drink enough water and fluids to keep your urine clear or pale yellow. Do this after quitting to flush the nicotine from your body.  Learn to predict your moods. Do not let a bad situation be your excuse to have a cigarette. Some situations in your life might tempt you into wanting a cigarette.  Never have "just one" cigarette. It leads to wanting another and another. Remind yourself of your decision to quit.  Change habits associated with smoking. If you smoked while driving or when feeling stressed, try other activities to replace smoking. Stand up when drinking your coffee. Brush your teeth after eating. Sit in a different chair when you read the paper. Avoid alcohol while trying to quit, and try to drink fewer caffeinated beverages. Alcohol and caffeine may urge you to  smoke.  Avoid foods and drinks that can trigger a desire to smoke, such as sugary or spicy foods and alcohol.  Ask people who smoke not to smoke around you.  Have something planned to do right after eating or having a cup of coffee. For example, plan to take a walk or exercise.  Try a relaxation exercise to calm you down and decrease your stress. Remember, you may be tense and nervous for the first 2 weeks after you quit, but this will pass.  Find new activities to keep your hands busy. Play with a pen, coin, or rubber band. Doodle or draw things on paper.  Brush your teeth right after eating. This will help cut down on the craving for the taste of tobacco after meals. You can also try mouthwash.   Use oral substitutes in place of cigarettes. Try using lemon drops, carrots, cinnamon sticks, or chewing gum. Keep them handy so they are available when you have the urge to smoke.  When you have the urge to smoke, try deep breathing.  Designate your home as a nonsmoking area.  If you are a heavy smoker, ask your health care provider about a prescription for nicotine chewing gum. It can ease your withdrawal from nicotine.  Reward yourself. Set aside the cigarette money you save and buy yourself something nice.  Look for support from others. Join a support group or smoking cessation program. Ask someone at home or at work to help you with your plan   to quit smoking.  Always ask yourself, "Do I need this cigarette or is this just a reflex?" Tell yourself, "Today, I choose not to smoke," or "I do not want to smoke." You are reminding yourself of your decision to quit.  Do not replace cigarette smoking with electronic cigarettes (commonly called e-cigarettes). The safety of e-cigarettes is unknown, and some may contain harmful chemicals.  If you relapse, do not give up! Plan ahead and think about what you will do the next time you get the urge to smoke. HOW WILL I FEEL WHEN I QUIT SMOKING? You  may have symptoms of withdrawal because your body is used to nicotine (the addictive substance in cigarettes). You may crave cigarettes, be irritable, feel very hungry, cough often, get headaches, or have difficulty concentrating. The withdrawal symptoms are only temporary. They are strongest when you first quit but will go away within 10-14 days. When withdrawal symptoms occur, stay in control. Think about your reasons for quitting. Remind yourself that these are signs that your body is healing and getting used to being without cigarettes. Remember that withdrawal symptoms are easier to treat than the major diseases that smoking can cause.  Even after the withdrawal is over, expect periodic urges to smoke. However, these cravings are generally short lived and will go away whether you smoke or not. Do not smoke! WHAT RESOURCES ARE AVAILABLE TO HELP ME QUIT SMOKING? Your health care provider can direct you to community resources or hospitals for support, which may include:  Group support.  Education.  Hypnosis.  Therapy.   This information is not intended to replace advice given to you by your health care provider. Make sure you discuss any questions you have with your health care provider.   Document Released: 01/09/2004 Document Revised: 05/03/2014 Document Reviewed: 09/28/2012 Elsevier Interactive Patient Education 2016 [employer]  

## 2015-12-02 NOTE — Telephone Encounter (Signed)
Gave pt cal & avs °

## 2015-12-03 ENCOUNTER — Encounter: Payer: Self-pay | Admitting: Internal Medicine

## 2015-12-03 NOTE — Progress Notes (Signed)
recd in box. left for dr. Mckinley Jewel to sign- left for patient to pck up and sent to medical [facility]

## 2015-12-03 NOTE — Progress Notes (Signed)
recd in box. left for dr. Mckinley Jewel to sign

## 2015-12-04 ENCOUNTER — Encounter: Payer: Self-pay | Admitting: Internal Medicine

## 2015-12-04 NOTE — Progress Notes (Signed)
Pt returned my call so we discussed the [place] First [employer] program.  She would like to apply so I will get her to sign the application and enroll her on 12/15/15.  I will also offer the [facility] at that time.

## 2015-12-04 NOTE — Progress Notes (Signed)
Left msg for pt to return my call to discuss copay assistance w/ the [place] [employer] program.

## 2015-12-05 ENCOUNTER — Encounter: Payer: Self-pay | Admitting: Internal Medicine

## 2015-12-15 ENCOUNTER — Other Ambulatory Visit: Payer: Self-pay | Admitting: Nurse Practitioner

## 2015-12-15 ENCOUNTER — Ambulatory Visit (HOSPITAL_BASED_OUTPATIENT_CLINIC_OR_DEPARTMENT_OTHER): Payer: 59 | Admitting: Nurse Practitioner

## 2015-12-15 ENCOUNTER — Ambulatory Visit (HOSPITAL_BASED_OUTPATIENT_CLINIC_OR_DEPARTMENT_OTHER): Payer: 59

## 2015-12-15 ENCOUNTER — Other Ambulatory Visit (HOSPITAL_BASED_OUTPATIENT_CLINIC_OR_DEPARTMENT_OTHER): Payer: 59

## 2015-12-15 ENCOUNTER — Encounter: Payer: Self-pay | Admitting: Nurse Practitioner

## 2015-12-15 VITALS — BP 137/70 | HR 85 | Temp 98.0°F | Resp 18

## 2015-12-15 VITALS — BP 135/62 | HR 84 | Temp 98.0°F | Resp 17 | Ht 66.0 in | Wt 259.5 lb

## 2015-12-15 DIAGNOSIS — Z5111 Encounter for antineoplastic chemotherapy: Secondary | ICD-10-CM | POA: Diagnosis not present

## 2015-12-15 DIAGNOSIS — C3492 Malignant neoplasm of unspecified part of left bronchus or lung: Secondary | ICD-10-CM

## 2015-12-15 DIAGNOSIS — R110 Nausea: Secondary | ICD-10-CM

## 2015-12-15 DIAGNOSIS — Z853 Personal history of malignant neoplasm of breast: Secondary | ICD-10-CM | POA: Diagnosis not present

## 2015-12-15 LAB — CBC WITH DIFFERENTIAL/PLATELET
BASO%: 1.1 % (ref 0.0–2.0)
BASOS ABS: 0.1 10*3/uL (ref 0.0–0.1)
EOS ABS: 0.1 10*3/uL (ref 0.0–0.5)
EOS%: 0.7 % (ref 0.0–7.0)
HCT: 41 % (ref 34.8–46.6)
HGB: 13.6 g/dL (ref 11.6–15.9)
LYMPH%: 16.8 % (ref 14.0–49.7)
MCH: 30.9 pg (ref 25.1–34.0)
MCHC: 33.2 g/dL (ref 31.5–36.0)
MCV: 92.8 fL (ref 79.5–101.0)
MONO#: 0.7 10*3/uL (ref 0.1–0.9)
MONO%: 6.3 % (ref 0.0–14.0)
NEUT#: 8.8 10*3/uL — ABNORMAL HIGH (ref 1.5–6.5)
NEUT%: 75.1 % (ref 38.4–76.8)
Platelets: 300 10*3/uL (ref 145–400)
RBC: 4.41 10*6/uL (ref 3.70–5.45)
RDW: 13.9 % (ref 11.2–14.5)
WBC: 11.6 10*3/uL — ABNORMAL HIGH (ref 3.9–10.3)
lymph#: 2 10*3/uL (ref 0.9–3.3)

## 2015-12-15 LAB — COMPREHENSIVE METABOLIC PANEL
ALK PHOS: 103 U/L (ref 40–150)
ALT: 31 U/L (ref 0–55)
AST: 19 U/L (ref 5–34)
Albumin: 3.2 g/dL — ABNORMAL LOW (ref 3.5–5.0)
Anion Gap: 11 mEq/L (ref 3–11)
BUN: 11.7 mg/dL (ref 7.0–26.0)
CHLORIDE: 108 meq/L (ref 98–109)
CO2: 23 mEq/L (ref 22–29)
Calcium: 9.6 mg/dL (ref 8.4–10.4)
Creatinine: 0.7 mg/dL (ref 0.6–1.1)
EGFR: 89 mL/min/{1.73_m2} — AB (ref >90)
GLUCOSE: 120 mg/dL (ref 70–140)
POTASSIUM: 4 meq/L (ref 3.5–5.1)
SODIUM: 141 meq/L (ref 136–145)
Total Bilirubin: <0.3 mg/dL (ref 0.20–1.20)
Total Protein: 6.8 g/dL (ref 6.4–8.3)

## 2015-12-15 LAB — MAGNESIUM: MAGNESIUM: 2.3 mg/dL (ref 1.5–2.5)

## 2015-12-15 MED ORDER — FOSAPREPITANT DIMEGLUMINE INJECTION 150 MG
Freq: Once | INTRAVENOUS | Status: AC
  Administered 2015-12-15: 12:00:00 via INTRAVENOUS
  Filled 2015-12-15: qty 5

## 2015-12-15 MED ORDER — SODIUM CHLORIDE 0.9 % IV SOLN
Freq: Once | INTRAVENOUS | Status: AC
  Administered 2015-12-15: 10:00:00 via INTRAVENOUS

## 2015-12-15 MED ORDER — PALONOSETRON HCL INJECTION 0.25 MG/5ML
0.2500 mg | Freq: Once | INTRAVENOUS | Status: AC
  Administered 2015-12-15: 0.25 mg via INTRAVENOUS

## 2015-12-15 MED ORDER — PALONOSETRON HCL INJECTION 0.25 MG/5ML
INTRAVENOUS | Status: AC
  Filled 2015-12-15: qty 5

## 2015-12-15 MED ORDER — PROCHLORPERAZINE MALEATE 10 MG PO TABS: 10.0000 mg | ORAL_TABLET | Freq: Four times a day (QID) | ORAL | 2 refills | Status: DC | PRN

## 2015-12-15 MED ORDER — POTASSIUM CHLORIDE 2 MEQ/ML IV SOLN
Freq: Once | INTRAVENOUS | Status: AC
  Administered 2015-12-15: 10:00:00 via INTRAVENOUS
  Filled 2015-12-15: qty 10

## 2015-12-15 MED ORDER — SODIUM CHLORIDE 0.9 % IV SOLN
120.0000 mg/m2 | Freq: Once | INTRAVENOUS | Status: AC
  Administered 2015-12-15: 280 mg via INTRAVENOUS
  Filled 2015-12-15: qty 14

## 2015-12-15 MED ORDER — SODIUM CHLORIDE 0.9 % IV SOLN
60.0000 mg/m2 | Freq: Once | INTRAVENOUS | Status: AC
  Administered 2015-12-15: 140 mg via INTRAVENOUS
  Filled 2015-12-15: qty 140

## 2015-12-15 NOTE — Assessment & Plan Note (Signed)
Patient states that she has had some mild, intermittent nausea since her first cycle of chemotherapy.  She has been taking Compazine with good results.  She's requesting a refill of her nausea medication today.

## 2015-12-15 NOTE — Progress Notes (Signed)
1225- Pt reports experiencing some dizziness and flushing a few minutes through etoposide infusion. Stopped etoposide and flushed pt with NS wide open. Placed pt on o2 on 2L, BP 170's sys. After a minute of flushing saline, pt immediately felt better and improved. Flushing went away and VS back to baseline 120's/80's. Restarted on etoposide at 1240 at decreased rate. Pt doing better and has no current complaints. All symptoms resolved.

## 2015-12-15 NOTE — Patient Instructions (Addendum)
[place] Discharge Instructions for Patients Receiving Chemotherapy  Today you received the following chemotherapy agents Cisplatin and Etoposide  To help prevent nausea and vomiting after your treatment, we encourage you to take your nausea medication as prescribed   If you develop nausea and vomiting that is not controlled by your nausea medication, call the clinic.   BELOW ARE SYMPTOMS THAT SHOULD BE REPORTED IMMEDIATELY:  *FEVER GREATER THAN 100.5 F  *CHILLS WITH OR WITHOUT FEVER  NAUSEA AND VOMITING THAT IS NOT CONTROLLED WITH YOUR NAUSEA MEDICATION  *UNUSUAL SHORTNESS OF BREATH  *UNUSUAL BRUISING OR BLEEDING  TENDERNESS IN MOUTH AND THROAT WITH OR WITHOUT PRESENCE OF ULCERS  *URINARY PROBLEMS  *BOWEL PROBLEMS  UNUSUAL RASH Items with * indicate a potential emergency and should be followed up as soon as possible.  Feel free to call the clinic you have any questions or concerns. The clinic phone number is [phone].  Please show the [place] at check-in to the Emergency Department and triage nurse.   Cisplatin injection What is this medicine? CISPLATIN (SIS pla tin) is a chemotherapy drug. It targets fast dividing cells, like cancer cells, and causes these cells to die. This medicine is used to treat many types of cancer like bladder, ovarian, and testicular cancers. This medicine may be used for other purposes; ask your health care provider or pharmacist if you have questions. What should I tell my health care provider before I take this medicine? They need to know if you have any of these conditions: -blood disorders -hearing problems -kidney disease -recent or ongoing radiation therapy -an unusual or allergic reaction to cisplatin, carboplatin, other chemotherapy, other medicines, foods, dyes, or preservatives -pregnant or trying to get pregnant -breast-feeding How should I use this medicine? This drug is given as an infusion into  a vein. It is administered in a hospital or clinic by a specially trained health care professional. Talk to your pediatrician regarding the use of this medicine in children. Special care may be needed. Overdosage: If you think you have taken too much of this medicine contact a poison control center or emergency room at once. NOTE: This medicine is only for you. Do not share this medicine with others. What if I miss a dose? It is important not to miss a dose. Call your doctor or health care professional if you are unable to keep an appointment. What may interact with this medicine? -dofetilide -foscarnet -medicines for seizures -medicines to increase blood counts like filgrastim, pegfilgrastim, sargramostim -probenecid -pyridoxine used with altretamine -rituximab -some antibiotics like amikacin, gentamicin, neomycin, polymyxin B, streptomycin, tobramycin -sulfinpyrazone -vaccines -zalcitabine Talk to your doctor or health care professional before taking any of these medicines: -acetaminophen -aspirin -ibuprofen -ketoprofen -naproxen This list may not describe all possible interactions. Give your health care provider a list of all the medicines, herbs, non-prescription drugs, or dietary supplements you use. Also tell them if you smoke, drink alcohol, or use illegal drugs. Some items may interact with your medicine. What should I watch for while using this medicine? Your condition will be monitored carefully while you are receiving this medicine. You will need important blood work done while you are taking this medicine. This drug may make you feel generally unwell. This is not uncommon, as chemotherapy can affect healthy cells as well as cancer cells. Report any side effects. Continue your course of treatment even though you feel ill unless your doctor tells you to stop. In some cases, you  may be given additional medicines to help with side effects. Follow all directions for their  use. Call your doctor or health care professional for advice if you get a fever, chills or sore throat, or other symptoms of a cold or flu. Do not treat yourself. This drug decreases your body's ability to fight infections. Try to avoid being around people who are sick. This medicine may increase your risk to bruise or bleed. Call your doctor or health care professional if you notice any unusual bleeding. Be careful brushing and flossing your teeth or using a toothpick because you may get an infection or bleed more easily. If you have any dental work done, tell your dentist you are receiving this medicine. Avoid taking products that contain aspirin, acetaminophen, ibuprofen, naproxen, or ketoprofen unless instructed by your doctor. These medicines may hide a fever. Do not become pregnant while taking this medicine. Women should inform their doctor if they wish to become pregnant or think they might be pregnant. There is a potential for serious side effects to an unborn child. Talk to your health care professional or pharmacist for more information. Do not breast-feed an infant while taking this medicine. Drink fluids as directed while you are taking this medicine. This will help protect your kidneys. Call your doctor or health care professional if you get diarrhea. Do not treat yourself. What side effects may I notice from receiving this medicine? Side effects that you should report to your doctor or health care professional as soon as possible: -allergic reactions like skin rash, itching or hives, swelling of the face, lips, or tongue -signs of infection - fever or chills, cough, sore throat, pain or difficulty passing urine -signs of decreased platelets or bleeding - bruising, pinpoint red spots on the skin, black, tarry stools, nosebleeds -signs of decreased red blood cells - unusually weak or tired, fainting spells, lightheadedness -breathing problems -changes in hearing -gout pain -low blood  counts - This drug may decrease the number of white blood cells, red blood cells and platelets. You may be at increased risk for infections and bleeding. -nausea and vomiting -pain, swelling, redness or irritation at the injection site -pain, tingling, numbness in the hands or feet -problems with balance, movement -trouble passing urine or change in the amount of urine Side effects that usually do not require medical attention (report to your doctor or health care professional if they continue or are bothersome): -changes in vision -loss of appetite -metallic taste in the mouth or changes in taste This list may not describe all possible side effects. Call your doctor for medical advice about side effects. You may report side effects to FDA at 1-800-FDA-1088. Where should I keep my medicine? This drug is given in a hospital or clinic and will not be stored at home. NOTE: This sheet is a summary. It may not cover all possible information. If you have questions about this medicine, talk to your doctor, pharmacist, or health care provider.    2016, [employer]. (2007-07-18 14:40:54)  Etoposide, VP-16 capsules What is this medicine? ETOPOSIDE, VP-16 (e toe POE side) is a chemotherapy drug. It is used to treat small cell lung cancer and other cancers. This medicine may be used for other purposes; ask your health care provider or pharmacist if you have questions. What should I tell my health care provider before I take this medicine? They need to know if you have any of these conditions: -infection -kidney disease -low blood counts, like low white  cell, platelet, or red cell counts -an unusual or allergic reaction to etoposide, other chemotherapeutic agents, other medicines, foods, dyes, or preservatives -pregnant or trying to get pregnant -breast-feeding How should I use this medicine? Take this medicine by mouth with a glass of water. Follow the directions on the prescription  label. Do not open, crush, or chew the capsules. It is advisable to wear gloves when handling this medicine. Take your medicine at regular intervals. Do not take it more often than directed. Do not stop taking except on your doctor's advice. Talk to your pediatrician regarding the use of this medicine in children. Special care may be needed. Overdosage: If you think you have taken too much of this medicine contact a poison control center or emergency room at once. NOTE: This medicine is only for you. Do not share this medicine with others. What if I miss a dose? If you miss a dose, take it as soon as you can. If it is almost time for your next dose, take only that dose. Do not take double or extra doses. What may interact with this medicine? -aspirin -certain medications for seizures like carbamazepine, phenobarbital, phenytoin, valproic acid -cyclosporine -levamisole -valproic acid -warfarin This list may not describe all possible interactions. Give your health care provider a list of all the medicines, herbs, non-prescription drugs, or dietary supplements you use. Also tell them if you smoke, drink alcohol, or use illegal drugs. Some items may interact with your medicine. What should I watch for while using this medicine? Visit your doctor for checks on your progress. This drug may make you feel generally unwell. This is not uncommon, as chemotherapy can affect healthy cells as well as cancer cells. Report any side effects. Continue your course of treatment even though you feel ill unless your doctor tells you to stop. In some cases, you may be given additional medicines to help with side effects. Follow all directions for their use. Call your doctor or health care professional for advice if you get a fever, chills or sore throat, or other symptoms of a cold or flu. Do not treat yourself. This drug decreases your body's ability to fight infections. Try to avoid being around people who are  sick. This medicine may increase your risk to bruise or bleed. Call your doctor or health care professional if you notice any unusual bleeding. Be careful brushing and flossing your teeth or using a toothpick because you may get an infection or bleed more easily. If you have any dental work done, tell your dentist you are receiving this medicine. Avoid taking products that contain aspirin, acetaminophen, ibuprofen, naproxen, or ketoprofen unless instructed by your doctor. These medicines may hide a fever. Do not become pregnant while taking this medicine or for at least 6 months after stopping it. Women should inform their doctor if they wish to become pregnant or think they might be pregnant. Women of child-bearing potential will need to have a negative pregnancy test before starting this medicine. There is a potential for serious side effects to an unborn child. Talk to your health care professional or pharmacist for more information. Do not breast-feed an infant while taking this medicine. Men must use a latex condom during sexual contact with a woman while taking this medicine and for at least 4 months after stopping it. A latex condom is needed even if you have had a vasectomy. Contact your doctor right away if your partner becomes pregnant. Do not donate sperm while taking this  medicine and for 4 months after you stop taking this medicine. Men should inform their doctors if they wish to father a child. This medicine may lower sperm counts. What side effects may I notice from receiving this medicine? Side effects that you should report to your doctor or health care professional as soon as possible: -allergic reactions like skin rash, itching or hives, swelling of the face, lips, or tongue -low blood counts - this medicine may decrease the number of white blood cells, red blood cells and platelets. You may be at increased risk for infections and bleeding. -signs of infection - fever or chills, cough,  sore throat, pain or difficulty passing urine -signs of decreased platelets or bleeding - bruising, pinpoint red spots on the skin, black, tarry stools, blood in the urine -signs of decreased red blood cells - unusually weak or tired, fainting spells, lightheadedness -breathing problems -changes in vision -mouth or throat sores or ulcers -pain, tingling, numbness in the hands or feet -redness, blistering, peeling or loosening of the skin, including inside the mouth -seizures -vomiting Side effects that usually do not require medical attention (report to your doctor or health care professional if they continue or are bothersome): -change in taste -diarrhea -hair loss -nausea -stomach pain This list may not describe all possible side effects. Call your doctor for medical advice about side effects. You may report side effects to FDA at 1-800-FDA-1088. Where should I keep my medicine? Keep out of the reach of children. Store in a refrigerator between 2 and 8 degrees C (36 and 46 degrees F). Do not freeze. Throw away any unused medicine after the expiration date. NOTE: This sheet is a summary. It may not cover all possible information. If you have questions about this medicine, talk to your doctor, pharmacist, or health care provider.    2016, [employer] Standard. (2013-12-06 38:18:29)   [place] Discharge Instructions for Patients Receiving Chemotherapy  Today you received the following chemotherapy agents Cisplatin and Etoposide  To help prevent nausea and vomiting after your treatment, we encourage you to take your nausea medication as prescribed   If you develop nausea and vomiting that is not controlled by your nausea medication, call the clinic.   BELOW ARE SYMPTOMS THAT SHOULD BE REPORTED IMMEDIATELY:  *FEVER GREATER THAN 100.5 F  *CHILLS WITH OR WITHOUT FEVER  NAUSEA AND VOMITING THAT IS NOT CONTROLLED WITH YOUR NAUSEA MEDICATION  *UNUSUAL SHORTNESS  OF BREATH  *UNUSUAL BRUISING OR BLEEDING  TENDERNESS IN MOUTH AND THROAT WITH OR WITHOUT PRESENCE OF ULCERS  *URINARY PROBLEMS  *BOWEL PROBLEMS  UNUSUAL RASH Items with * indicate a potential emergency and should be followed up as soon as possible.  Feel free to call the clinic you have any questions or concerns. The clinic phone number is [phone].  Please show the [place] at check-in to the Emergency Department and triage nurse.   Cisplatin injection What is this medicine? CISPLATIN (SIS pla tin) is a chemotherapy drug. It targets fast dividing cells, like cancer cells, and causes these cells to die. This medicine is used to treat many types of cancer like bladder, ovarian, and testicular cancers. This medicine may be used for other purposes; ask your health care provider or pharmacist if you have questions. What should I tell my health care provider before I take this medicine? They need to know if you have any of these conditions: -blood disorders -hearing problems -kidney disease -recent or ongoing radiation therapy -  an unusual or allergic reaction to cisplatin, carboplatin, other chemotherapy, other medicines, foods, dyes, or preservatives -pregnant or trying to get pregnant -breast-feeding How should I use this medicine? This drug is given as an infusion into a vein. It is administered in a hospital or clinic by a specially trained health care professional. Talk to your pediatrician regarding the use of this medicine in children. Special care may be needed. Overdosage: If you think you have taken too much of this medicine contact a poison control center or emergency room at once. NOTE: This medicine is only for you. Do not share this medicine with others. What if I miss a dose? It is important not to miss a dose. Call your doctor or health care professional if you are unable to keep an appointment. What may interact with this  medicine? -dofetilide -foscarnet -medicines for seizures -medicines to increase blood counts like filgrastim, pegfilgrastim, sargramostim -probenecid -pyridoxine used with altretamine -rituximab -some antibiotics like amikacin, gentamicin, neomycin, polymyxin B, streptomycin, tobramycin -sulfinpyrazone -vaccines -zalcitabine Talk to your doctor or health care professional before taking any of these medicines: -acetaminophen -aspirin -ibuprofen -ketoprofen -naproxen This list may not describe all possible interactions. Give your health care provider a list of all the medicines, herbs, non-prescription drugs, or dietary supplements you use. Also tell them if you smoke, drink alcohol, or use illegal drugs. Some items may interact with your medicine. What should I watch for while using this medicine? Your condition will be monitored carefully while you are receiving this medicine. You will need important blood work done while you are taking this medicine. This drug may make you feel generally unwell. This is not uncommon, as chemotherapy can affect healthy cells as well as cancer cells. Report any side effects. Continue your course of treatment even though you feel ill unless your doctor tells you to stop. In some cases, you may be given additional medicines to help with side effects. Follow all directions for their use. Call your doctor or health care professional for advice if you get a fever, chills or sore throat, or other symptoms of a cold or flu. Do not treat yourself. This drug decreases your body's ability to fight infections. Try to avoid being around people who are sick. This medicine may increase your risk to bruise or bleed. Call your doctor or health care professional if you notice any unusual bleeding. Be careful brushing and flossing your teeth or using a toothpick because you may get an infection or bleed more easily. If you have any dental work done, tell your dentist you are  receiving this medicine. Avoid taking products that contain aspirin, acetaminophen, ibuprofen, naproxen, or ketoprofen unless instructed by your doctor. These medicines may hide a fever. Do not become pregnant while taking this medicine. Women should inform their doctor if they wish to become pregnant or think they might be pregnant. There is a potential for serious side effects to an unborn child. Talk to your health care professional or pharmacist for more information. Do not breast-feed an infant while taking this medicine. Drink fluids as directed while you are taking this medicine. This will help protect your kidneys. Call your doctor or health care professional if you get diarrhea. Do not treat yourself. What side effects may I notice from receiving this medicine? Side effects that you should report to your doctor or health care professional as soon as possible: -allergic reactions like skin rash, itching or hives, swelling of the face, lips, or tongue -signs  of infection - fever or chills, cough, sore throat, pain or difficulty passing urine -signs of decreased platelets or bleeding - bruising, pinpoint red spots on the skin, black, tarry stools, nosebleeds -signs of decreased red blood cells - unusually weak or tired, fainting spells, lightheadedness -breathing problems -changes in hearing -gout pain -low blood counts - This drug may decrease the number of white blood cells, red blood cells and platelets. You may be at increased risk for infections and bleeding. -nausea and vomiting -pain, swelling, redness or irritation at the injection site -pain, tingling, numbness in the hands or feet -problems with balance, movement -trouble passing urine or change in the amount of urine Side effects that usually do not require medical attention (report to your doctor or health care professional if they continue or are bothersome): -changes in vision -loss of appetite -metallic taste in the mouth  or changes in taste This list may not describe all possible side effects. Call your doctor for medical advice about side effects. You may report side effects to FDA at 1-800-FDA-1088. Where should I keep my medicine? This drug is given in a hospital or clinic and will not be stored at home. NOTE: This sheet is a summary. It may not cover all possible information. If you have questions about this medicine, talk to your doctor, pharmacist, or health care provider.    2016, [employer] Standard. (2007-07-18 14:40:54)  Etoposide, VP-16 capsules What is this medicine? ETOPOSIDE, VP-16 (e toe POE side) is a chemotherapy drug. It is used to treat small cell lung cancer and other cancers. This medicine may be used for other purposes; ask your health care provider or pharmacist if you have questions. What should I tell my health care provider before I take this medicine? They need to know if you have any of these conditions: -infection -kidney disease -low blood counts, like low white cell, platelet, or red cell counts -an unusual or allergic reaction to etoposide, other chemotherapeutic agents, other medicines, foods, dyes, or preservatives -pregnant or trying to get pregnant -breast-feeding How should I use this medicine? Take this medicine by mouth with a glass of water. Follow the directions on the prescription label. Do not open, crush, or chew the capsules. It is advisable to wear gloves when handling this medicine. Take your medicine at regular intervals. Do not take it more often than directed. Do not stop taking except on your doctor's advice. Talk to your pediatrician regarding the use of this medicine in children. Special care may be needed. Overdosage: If you think you have taken too much of this medicine contact a poison control center or emergency room at once. NOTE: This medicine is only for you. Do not share this medicine with others. What if I miss a dose? If you miss a dose,  take it as soon as you can. If it is almost time for your next dose, take only that dose. Do not take double or extra doses. What may interact with this medicine? -aspirin -certain medications for seizures like carbamazepine, phenobarbital, phenytoin, valproic acid -cyclosporine -levamisole -valproic acid -warfarin This list may not describe all possible interactions. Give your health care provider a list of all the medicines, herbs, non-prescription drugs, or dietary supplements you use. Also tell them if you smoke, drink alcohol, or use illegal drugs. Some items may interact with your medicine. What should I watch for while using this medicine? Visit your doctor for checks on your progress. This drug may make you feel generally  unwell. This is not uncommon, as chemotherapy can affect healthy cells as well as cancer cells. Report any side effects. Continue your course of treatment even though you feel ill unless your doctor tells you to stop. In some cases, you may be given additional medicines to help with side effects. Follow all directions for their use. Call your doctor or health care professional for advice if you get a fever, chills or sore throat, or other symptoms of a cold or flu. Do not treat yourself. This drug decreases your body's ability to fight infections. Try to avoid being around people who are sick. This medicine may increase your risk to bruise or bleed. Call your doctor or health care professional if you notice any unusual bleeding. Be careful brushing and flossing your teeth or using a toothpick because you may get an infection or bleed more easily. If you have any dental work done, tell your dentist you are receiving this medicine. Avoid taking products that contain aspirin, acetaminophen, ibuprofen, naproxen, or ketoprofen unless instructed by your doctor. These medicines may hide a fever. Do not become pregnant while taking this medicine or for at least 6 months after  stopping it. Women should inform their doctor if they wish to become pregnant or think they might be pregnant. Women of child-bearing potential will need to have a negative pregnancy test before starting this medicine. There is a potential for serious side effects to an unborn child. Talk to your health care professional or pharmacist for more information. Do not breast-feed an infant while taking this medicine. Men must use a latex condom during sexual contact with a woman while taking this medicine and for at least 4 months after stopping it. A latex condom is needed even if you have had a vasectomy. Contact your doctor right away if your partner becomes pregnant. Do not donate sperm while taking this medicine and for 4 months after you stop taking this medicine. Men should inform their doctors if they wish to father a child. This medicine may lower sperm counts. What side effects may I notice from receiving this medicine? Side effects that you should report to your doctor or health care professional as soon as possible: -allergic reactions like skin rash, itching or hives, swelling of the face, lips, or tongue -low blood counts - this medicine may decrease the number of white blood cells, red blood cells and platelets. You may be at increased risk for infections and bleeding. -signs of infection - fever or chills, cough, sore throat, pain or difficulty passing urine -signs of decreased platelets or bleeding - bruising, pinpoint red spots on the skin, black, tarry stools, blood in the urine -signs of decreased red blood cells - unusually weak or tired, fainting spells, lightheadedness -breathing problems -changes in vision -mouth or throat sores or ulcers -pain, tingling, numbness in the hands or feet -redness, blistering, peeling or loosening of the skin, including inside the mouth -seizures -vomiting Side effects that usually do not require medical attention (report to your doctor or health care  professional if they continue or are bothersome): -change in taste -diarrhea -hair loss -nausea -stomach pain This list may not describe all possible side effects. Call your doctor for medical advice about side effects. You may report side effects to FDA at 1-800-FDA-1088. Where should I keep my medicine? Keep out of the reach of children. Store in a refrigerator between 2 and 8 degrees C (36 and 46 degrees F). Do not freeze. Throw away any  unused medicine after the expiration date. NOTE: This sheet is a summary. It may not cover all possible information. If you have questions about this medicine, talk to your doctor, pharmacist, or health care provider.    2016, Elsevier/Gold Standard. (2013-12-06 21:03:12)

## 2015-12-15 NOTE — Progress Notes (Signed)
ESTABLISHED PATIENT  Sharon [name] 60 y.o. female diagnosed with lung cancer.  Currently undergoingcisplatin/etoposide chemotherapy regimen.  Patient presents to the [facility] today to receive cycle 2 of her cisplatin/etoposide chemotherapy regimen.  She states that she did fairly well with her first cycle of chemotherapy; with her only complaint some mild nausea.      No history exists.    Review of Systems  Gastrointestinal: Positive for nausea. Negative for vomiting.  All other systems reviewed and are negative.   Past Medical History:  Diagnosis Date  . Cancer of right breast ([place]) 2009  . CAP (community acquired pneumonia) 11/06/2015  . COPD (chronic obstructive pulmonary disease) ([place])   . Elevated blood pressure   . Hoarseness of voice    "for the last 2 months" (11/06/2015)  . Lung mass dx'd 10/2015  . Menopause   . Pre-diabetes   . Small cell lung cancer ([place])   . Vitamin D deficiency     Past Surgical History:  Procedure Laterality Date  . BREAST BIOPSY Right 2009  . BREAST LUMPECTOMY Right 2009  . UTERINE FIBROID SURGERY  2000  . VAGINAL HYSTERECTOMY  2000  . VESICOVAGINAL FISTULA CLOSURE W/ TAH  2000  . VIDEO BRONCHOSCOPY WITH ENDOBRONCHIAL ULTRASOUND N/A 11/10/2015   Procedure: VIDEO BRONCHOSCOPY WITH ENDOBRONCHIAL ULTRASOUND;  Surgeon: Javier Glazier, MD;  Location: [facility];  Service: Thoracic;  Laterality: N/A;    has COPD, severe ([place]); Smoking; Breast cancer ([place]); Mediastinal adenopathy; Primary small cell carcinoma of left lung ([name]); and Nausea without vomiting on her problem list.    has No Known Allergies.    Medication List       Accurate as of 12/15/15  9:48 AM. Always use your most recent med list.          albuterol 108 (90 Base) MCG/ACT inhaler Commonly known as:  PROVENTIL HFA;VENTOLIN HFA Inhale 1 puff into the lungs every 6 (six) hours as needed for wheezing or shortness of breath.   budesonide-formoterol 160-4.5 MCG/ACT  inhaler Commonly known as:  SYMBICORT Inhale 2 puffs into the lungs 2 (two) times daily.   fluticasone 50 MCG/ACT nasal spray Commonly known as:  FLONASE Place into the nose.   prochlorperazine 10 MG tablet Commonly known as:  COMPAZINE Take 1 tablet (10 mg total) by mouth every 6 (six) hours as needed for nausea or vomiting.        PHYSICAL EXAMINATION  Oncology Vitals 12/15/2015 12/02/2015  Height 168 cm 168 cm  Weight 117.708 kg 115.531 kg  Weight (lbs) 259 lbs 8 oz 254 lbs 11 oz  BMI (kg/m2) 41.88 kg/m2 41.11 kg/m2  Temp 98 98  Pulse 84 77  Resp 17 18  Resp (Historical as of 11/25/11) - -  SpO2 96 97  BSA (m2) 2.34 m2 2.32 m2   BP Readings from Last 2 Encounters:  12/15/15 135/62  12/02/15 (!) 128/91    Physical Exam  Constitutional: She is oriented to person, place, and time and well-developed, well-nourished, and in no distress.  HENT:  Head: Normocephalic and atraumatic.  Alopecia.  Patient has a small dark spot to her right lower lip that appears to be a mole.  Eyes: Conjunctivae and EOM are normal. Pupils are equal, round, and reactive to light. Right eye exhibits no discharge. Left eye exhibits no discharge. No scleral icterus.  Neck: Normal range of motion.  Pulmonary/Chest: Effort normal. No respiratory distress.  Musculoskeletal: Normal range of motion. She exhibits  no edema or tenderness.  Neurological: She is alert and oriented to person, place, and time. Gait normal.  Skin: Skin is warm and dry.  Psychiatric: Affect normal.  Nursing note and vitals reviewed.   LABORATORY DATA:. Appointment on 12/15/2015  Component Date Value Ref Range Status  . WBC 12/15/2015 11.6* 3.9 - 10.3 10e3/uL Final  . NEUT# 12/15/2015 8.8* 1.5 - 6.5 10e3/uL Final  . HGB 12/15/2015 13.6  11.6 - 15.9 g/dL Final  . HCT 12/15/2015 41.0  34.8 - 46.6 % Final  . Platelets 12/15/2015 300  145 - 400 10e3/uL Final  . MCV 12/15/2015 92.8  79.5 - 101.0 fL Final  . MCH 12/15/2015 30.9   25.1 - 34.0 pg Final  . MCHC 12/15/2015 33.2  31.5 - 36.0 g/dL Final  . RBC 12/15/2015 4.41  3.70 - 5.45 10e6/uL Final  . RDW 12/15/2015 13.9  11.2 - 14.5 % Final  . lymph# 12/15/2015 2.0  0.9 - 3.3 10e3/uL Final  . MONO# 12/15/2015 0.7  0.1 - 0.9 10e3/uL Final  . Eosinophils Absolute 12/15/2015 0.1  0.0 - 0.5 10e3/uL Final  . Basophils Absolute 12/15/2015 0.1  0.0 - 0.1 10e3/uL Final  . NEUT% 12/15/2015 75.1  38.4 - 76.8 % Final  . LYMPH% 12/15/2015 16.8  14.0 - 49.7 % Final  . MONO% 12/15/2015 6.3  0.0 - 14.0 % Final  . EOS% 12/15/2015 0.7  0.0 - 7.0 % Final  . BASO% 12/15/2015 1.1  0.0 - 2.0 % Final  . Sodium 12/15/2015 141  136 - 145 mEq/L Final  . Potassium 12/15/2015 4.0  3.5 - 5.1 mEq/L Final  . Chloride 12/15/2015 108  98 - 109 mEq/L Final  . CO2 12/15/2015 23  22 - 29 mEq/L Final  . Glucose 12/15/2015 120  70 - 140 mg/dl Final  . BUN 12/15/2015 11.7  7.0 - 26.0 mg/dL Final  . Creatinine 12/15/2015 0.7  0.6 - 1.1 mg/dL Final  . Total Bilirubin 12/15/2015 <0.30  0.20 - 1.20 mg/dL Final  . Alkaline Phosphatase 12/15/2015 103  40 - 150 U/L Final  . AST 12/15/2015 19  5 - 34 U/L Final  . ALT 12/15/2015 31  0 - 55 U/L Final  . Total Protein 12/15/2015 6.8  6.4 - 8.3 g/dL Final  . Albumin 12/15/2015 3.2* 3.5 - 5.0 g/dL Final  . Calcium 12/15/2015 9.6  8.4 - 10.4 mg/dL Final  . Anion Gap 12/15/2015 11  3 - 11 mEq/L Final  . EGFR 12/15/2015 89* >90 ml/min/1.73 m2 Final  . Magnesium 12/15/2015 2.3  1.5 - 2.5 mg/dl Final    RADIOGRAPHIC STUDIES: No results found.  ASSESSMENT/PLAN:    Primary small cell carcinoma of left lung ([place]) Patient received cycle one of her cisplatin/etoposide chemotherapy regimen on 11/24/2015.  She received Neulasta for growth factor support following her for cycle of chemotherapy.  She states she tolerated the first cycle of chemotherapy fairly well with only mild nausea.  She is asking for refill of her Compazine today.  She states that her hemorrhoids  are stable at the current time.  She has noticed a dark spot to the bottom of her lower lip; but states that this occurred before she started chemotherapy.  Exam-it does appear the patient has looks like a mole to her right bottom lip.  Labs were all within normal limits today.  Dr. Julien Nordmann in to review all with the patient.  Patient will return on 12/16/2015 and 12/17/2015 for days 2  and 3 of her etoposide portion of the chemotherapy regimen.  She will return on 12/19/2015 for her Neulasta injection.  She will continue with weekly labs as well.  Patient will require a restaging CT with contrast of the chest prior to receiving cycle 3 of her chemotherapy on 01/05/2016.    Nausea without vomiting Patient states that she has had some mild, intermittent nausea since her first cycle of chemotherapy.  She has been taking Compazine with good results.  She's requesting a refill of her nausea medication today.   Patient stated understanding of all instructions; and was in agreement with this plan of care. The patient knows to call the clinic with any problems, questions or concerns.   Total time spent with patient was 25 minutes;  with greater than 75 percent of that time spent in face to face counseling regarding patient's symptoms,  and coordination of care and follow up.  Disclaimer:This dictation was prepared with Dragon/digital dictation along with [employer]. Any transcriptional errors that result from this process are unintentional.  Drue Second, NP 12/15/2015    ADDENDUM: Hematology/Oncology Attending: I had a face to face encounter with the patient today. I recommended her care plan. This is a very pleasant 60 years old white female with extensive stage small cell lung cancer currently undergoing systemic chemotherapy with cisplatin and etoposide status post 1 cycle. The patient related the first cycle of her treatment well except for alopecia. She denied having any  significant nausea or vomiting. She has no chest pain, shortness of breath, cough or hemoptysis. I recommended for her to proceed with cycle #2 today as a scheduled. The patient would come back for follow-up visit in 3 weeks for evaluation after repeating CT scan of the chest for restaging of her disease. She was advised to call immediately if she has any concerning symptoms in the interval.  Disclaimer: This note was dictated with voice recognition software. Similar sounding words can inadvertently be transcribed and may be missed upon review. [name]., MD 12/15/15

## 2015-12-15 NOTE — Assessment & Plan Note (Signed)
Patient received cycle one of her cisplatin/etoposide chemotherapy regimen on 11/24/2015.  She received Neulasta for growth factor support following her for cycle of chemotherapy.  She states she tolerated the first cycle of chemotherapy fairly well with only mild nausea.  She is asking for refill of her Compazine today.  She states that her hemorrhoids are stable at the current time.  She has noticed a dark spot to the bottom of her lower lip; but states that this occurred before she started chemotherapy.  Exam-it does appear the patient has looks like a mole to her right bottom lip.  Labs were all within normal limits today.  Dr. Julien Nordmann in to review all with the patient.  Patient will return on 12/16/2015 and 12/17/2015 for days 2 and 3 of her etoposide portion of the chemotherapy regimen.  She will return on 12/19/2015 for her Neulasta injection.  She will continue with weekly labs as well.  Patient will require a restaging CT with contrast of the chest prior to receiving cycle 3 of her chemotherapy on 01/05/2016.

## 2015-12-16 ENCOUNTER — Ambulatory Visit (HOSPITAL_BASED_OUTPATIENT_CLINIC_OR_DEPARTMENT_OTHER): Payer: 59

## 2015-12-16 VITALS — BP 128/75 | HR 76 | Temp 98.9°F | Resp 16

## 2015-12-16 DIAGNOSIS — C3492 Malignant neoplasm of unspecified part of left bronchus or lung: Secondary | ICD-10-CM | POA: Diagnosis not present

## 2015-12-16 DIAGNOSIS — Z5111 Encounter for antineoplastic chemotherapy: Secondary | ICD-10-CM | POA: Diagnosis not present

## 2015-12-16 MED ORDER — SODIUM CHLORIDE 0.9 % IV SOLN
120.0000 mg/m2 | Freq: Once | INTRAVENOUS | Status: AC
  Administered 2015-12-16: 280 mg via INTRAVENOUS
  Filled 2015-12-16: qty 14

## 2015-12-16 MED ORDER — DEXAMETHASONE SODIUM PHOSPHATE 100 MG/10ML IJ SOLN
10.0000 mg | Freq: Once | INTRAMUSCULAR | Status: AC
  Administered 2015-12-16: 10 mg via INTRAVENOUS
  Filled 2015-12-16: qty 1

## 2015-12-16 MED ORDER — SODIUM CHLORIDE 0.9 % IV SOLN
Freq: Once | INTRAVENOUS | Status: AC
  Administered 2015-12-16: 15:00:00 via INTRAVENOUS

## 2015-12-16 NOTE — Patient Instructions (Signed)
[facility] Discharge Instructions for Patients Receiving Chemotherapy  Today you received the following chemotherapy agents: Etoposide   To help prevent nausea and vomiting after your treatment, we encourage you to take your nausea medication as directed.    If you develop nausea and vomiting that is not controlled by your nausea medication, call the clinic.   BELOW ARE SYMPTOMS THAT SHOULD BE REPORTED IMMEDIATELY:  *FEVER GREATER THAN 100.5 F  *CHILLS WITH OR WITHOUT FEVER  NAUSEA AND VOMITING THAT IS NOT CONTROLLED WITH YOUR NAUSEA MEDICATION  *UNUSUAL SHORTNESS OF BREATH  *UNUSUAL BRUISING OR BLEEDING  TENDERNESS IN MOUTH AND THROAT WITH OR WITHOUT PRESENCE OF ULCERS  *URINARY PROBLEMS  *BOWEL PROBLEMS  UNUSUAL RASH Items with * indicate a potential emergency and should be followed up as soon as possible.  Feel free to call the clinic you have any questions or concerns. The clinic phone number is [phone].  Please show the CHEMO ALERT CARD at check-in to the Emergency Department and triage nurse.   

## 2015-12-17 ENCOUNTER — Ambulatory Visit (HOSPITAL_BASED_OUTPATIENT_CLINIC_OR_DEPARTMENT_OTHER): Payer: 59

## 2015-12-17 VITALS — BP 104/64 | HR 59 | Temp 98.0°F | Resp 17

## 2015-12-17 DIAGNOSIS — Z5111 Encounter for antineoplastic chemotherapy: Secondary | ICD-10-CM

## 2015-12-17 DIAGNOSIS — C3492 Malignant neoplasm of unspecified part of left bronchus or lung: Secondary | ICD-10-CM

## 2015-12-17 MED ORDER — SODIUM CHLORIDE 0.9 % IV SOLN
Freq: Once | INTRAVENOUS | Status: AC
  Administered 2015-12-17: 14:00:00 via INTRAVENOUS

## 2015-12-17 MED ORDER — SODIUM CHLORIDE 0.9 % IV SOLN
10.0000 mg | Freq: Once | INTRAVENOUS | Status: AC
  Administered 2015-12-17: 10 mg via INTRAVENOUS
  Filled 2015-12-17: qty 1

## 2015-12-17 MED ORDER — SODIUM CHLORIDE 0.9 % IV SOLN
120.0000 mg/m2 | Freq: Once | INTRAVENOUS | Status: AC
  Administered 2015-12-17: 280 mg via INTRAVENOUS
  Filled 2015-12-17: qty 14

## 2015-12-17 NOTE — Patient Instructions (Signed)
[facility] Center Discharge Instructions for Patients Receiving Chemotherapy  Today you received the following chemotherapy agents: Etoposide   To help prevent nausea and vomiting after your treatment, we encourage you to take your nausea medication as directed.    If you develop nausea and vomiting that is not controlled by your nausea medication, call the clinic.   BELOW ARE SYMPTOMS THAT SHOULD BE REPORTED IMMEDIATELY:  *FEVER GREATER THAN 100.5 F  *CHILLS WITH OR WITHOUT FEVER  NAUSEA AND VOMITING THAT IS NOT CONTROLLED WITH YOUR NAUSEA MEDICATION  *UNUSUAL SHORTNESS OF BREATH  *UNUSUAL BRUISING OR BLEEDING  TENDERNESS IN MOUTH AND THROAT WITH OR WITHOUT PRESENCE OF ULCERS  *URINARY PROBLEMS  *BOWEL PROBLEMS  UNUSUAL RASH Items with * indicate a potential emergency and should be followed up as soon as possible.  Feel free to call the clinic you have any questions or concerns. The clinic phone number is [phone].  Please show the CHEMO ALERT CARD at check-in to the Emergency Department and triage nurse.   

## 2015-12-18 ENCOUNTER — Encounter: Payer: Self-pay | Admitting: Internal Medicine

## 2015-12-18 NOTE — Progress Notes (Signed)
Pt called informing me she's ok financially and doesn't want to apply for the [facility] grant at this time.  I told her the grant is available as long as she's in active treatment so if she changes her mind to give me a call.  She verbalized understanding.

## 2015-12-19 ENCOUNTER — Ambulatory Visit (HOSPITAL_BASED_OUTPATIENT_CLINIC_OR_DEPARTMENT_OTHER): Payer: 59

## 2015-12-19 VITALS — BP 118/80 | HR 68 | Temp 98.5°F

## 2015-12-19 DIAGNOSIS — C3492 Malignant neoplasm of unspecified part of left bronchus or lung: Secondary | ICD-10-CM | POA: Diagnosis not present

## 2015-12-19 DIAGNOSIS — Z5189 Encounter for other specified aftercare: Secondary | ICD-10-CM

## 2015-12-19 MED ORDER — PEGFILGRASTIM INJECTION 6 MG/0.6ML ~~LOC~~
6.0000 mg | PREFILLED_SYRINGE | Freq: Once | SUBCUTANEOUS | Status: AC
  Administered 2015-12-19: 6 mg via SUBCUTANEOUS
  Filled 2015-12-19: qty 0.6

## 2015-12-19 NOTE — Patient Instructions (Signed)
Pegfilgrastim injection What is this medicine? PEGFILGRASTIM (PEG fil gra stim) is a long-acting granulocyte colony-stimulating factor that stimulates the growth of neutrophils, a type of white blood cell important in the body's fight against infection. It is used to reduce the incidence of fever and infection in patients with certain types of cancer who are receiving chemotherapy that affects the bone marrow, and to increase survival after being exposed to high doses of radiation. This medicine may be used for other purposes; ask your health care provider or pharmacist if you have questions. What should I tell my health care provider before I take this medicine? They need to know if you have any of these conditions: -kidney disease -latex allergy -ongoing radiation therapy -sickle cell disease -skin reactions to acrylic adhesives (On-Body Injector only) -an unusual or allergic reaction to pegfilgrastim, filgrastim, other medicines, foods, dyes, or preservatives -pregnant or trying to get pregnant -breast-feeding How should I use this medicine? This medicine is for injection under the skin. If you get this medicine at home, you will be taught how to prepare and give the pre-filled syringe or how to use the On-body Injector. Refer to the patient Instructions for Use for detailed instructions. Use exactly as directed. Take your medicine at regular intervals. Do not take your medicine more often than directed. It is important that you put your used needles and syringes in a special sharps container. Do not put them in a trash can. If you do not have a sharps container, call your pharmacist or healthcare provider to get one. Talk to your pediatrician regarding the use of this medicine in children. While this drug may be prescribed for selected conditions, precautions do apply. Overdosage: If you think you have taken too much of this medicine contact a poison control center or emergency room at  once. NOTE: This medicine is only for you. Do not share this medicine with others. What if I miss a dose? It is important not to miss your dose. Call your doctor or health care professional if you miss your dose. If you miss a dose due to an On-body Injector failure or leakage, a new dose should be administered as soon as possible using a single prefilled syringe for manual use. What may interact with this medicine? Interactions have not been studied. Give your health care provider a list of all the medicines, herbs, non-prescription drugs, or dietary supplements you use. Also tell them if you smoke, drink alcohol, or use illegal drugs. Some items may interact with your medicine. This list may not describe all possible interactions. Give your health care provider a list of all the medicines, herbs, non-prescription drugs, or dietary supplements you use. Also tell them if you smoke, drink alcohol, or use illegal drugs. Some items may interact with your medicine. What should I watch for while using this medicine? You may need blood work done while you are taking this medicine. If you are going to need a MRI, CT scan, or other procedure, tell your doctor that you are using this medicine (On-Body Injector only). What side effects may I notice from receiving this medicine? Side effects that you should report to your doctor or health care professional as soon as possible: -allergic reactions like skin rash, itching or hives, swelling of the face, lips, or tongue -dizziness -fever -pain, redness, or irritation at site where injected -pinpoint red spots on the skin -red or dark-brown urine -shortness of breath or breathing problems -stomach or side pain, or pain   at the shoulder -swelling -tiredness -trouble passing urine or change in the amount of urine Side effects that usually do not require medical attention (report to your doctor or health care professional if they continue or are  bothersome): -bone pain -muscle pain This list may not describe all possible side effects. Call your doctor for medical advice about side effects. You may report side effects to FDA at 1-800-FDA-1088. Where should I keep my medicine? Keep out of the reach of children. Store pre-filled syringes in a refrigerator between 2 and 8 degrees C (36 and 46 degrees F). Do not freeze. Keep in carton to protect from light. Throw away this medicine if it is left out of the refrigerator for more than 48 hours. Throw away any unused medicine after the expiration date. NOTE: This sheet is a summary. It may not cover all possible information. If you have questions about this medicine, talk to your doctor, pharmacist, or health care provider.    2016, [employer]vier/[employer] Standard. (2014-05-02 14:30:14)  

## 2015-12-22 ENCOUNTER — Other Ambulatory Visit (HOSPITAL_BASED_OUTPATIENT_CLINIC_OR_DEPARTMENT_OTHER): Payer: 59

## 2015-12-22 DIAGNOSIS — C3492 Malignant neoplasm of unspecified part of left bronchus or lung: Secondary | ICD-10-CM

## 2015-12-22 LAB — CBC WITH DIFFERENTIAL/PLATELET
BASO%: 0.4 % (ref 0.0–2.0)
BASOS ABS: 0.1 10*3/uL (ref 0.0–0.1)
EOS%: 0.1 % (ref 0.0–7.0)
Eosinophils Absolute: 0 10*3/uL (ref 0.0–0.5)
HEMATOCRIT: 37.3 % (ref 34.8–46.6)
HGB: 12.6 g/dL (ref 11.6–15.9)
LYMPH#: 1.5 10*3/uL (ref 0.9–3.3)
LYMPH%: 9.1 % — ABNORMAL LOW (ref 14.0–49.7)
MCH: 31.4 pg (ref 25.1–34.0)
MCHC: 33.8 g/dL (ref 31.5–36.0)
MCV: 93 fL (ref 79.5–101.0)
MONO#: 0.5 10*3/uL (ref 0.1–0.9)
MONO%: 2.7 % (ref 0.0–14.0)
NEUT#: 14.5 10*3/uL — ABNORMAL HIGH (ref 1.5–6.5)
NEUT%: 87.7 % — AB (ref 38.4–76.8)
PLATELETS: 323 10*3/uL (ref 145–400)
RBC: 4.01 10*6/uL (ref 3.70–5.45)
RDW: 14.1 % (ref 11.2–14.5)
WBC: 16.5 10*3/uL — ABNORMAL HIGH (ref 3.9–10.3)

## 2015-12-22 LAB — COMPREHENSIVE METABOLIC PANEL
ALT: 38 U/L (ref 0–55)
ANION GAP: 9 meq/L (ref 3–11)
AST: 19 U/L (ref 5–34)
Albumin: 3.3 g/dL — ABNORMAL LOW (ref 3.5–5.0)
Alkaline Phosphatase: 139 U/L (ref 40–150)
BUN: 14.3 mg/dL (ref 7.0–26.0)
CALCIUM: 9.6 mg/dL (ref 8.4–10.4)
CHLORIDE: 102 meq/L (ref 98–109)
CO2: 28 mEq/L (ref 22–29)
Creatinine: 0.8 mg/dL (ref 0.6–1.1)
EGFR: 86 mL/min/{1.73_m2} — AB (ref >90)
Glucose: 126 mg/dl (ref 70–140)
POTASSIUM: 4.7 meq/L (ref 3.5–5.1)
Sodium: 139 mEq/L (ref 136–145)
Total Bilirubin: 0.55 mg/dL (ref 0.20–1.20)
Total Protein: 6.7 g/dL (ref 6.4–8.3)

## 2015-12-22 LAB — MAGNESIUM: MAGNESIUM: 2 mg/dL (ref 1.5–2.5)

## 2015-12-25 ENCOUNTER — Encounter: Payer: Self-pay | Admitting: Internal Medicine

## 2015-12-25 NOTE — Progress Notes (Signed)
[facility] form left in my box

## 2015-12-26 ENCOUNTER — Telehealth: Payer: Self-pay | Admitting: *Deleted

## 2015-12-26 NOTE — Telephone Encounter (Signed)
Sharon [name], Sharon [name] needs a return call in reference to "Look good Feel Better classes.  Return number is [phone].

## 2015-12-26 NOTE — Telephone Encounter (Signed)
"  I am calling because I have a nose bleed.  It only bleeds a little but yesterday there was a lot of blood when I blew my nose.  The majority of what comes out is a scab from the left nostril.  There was a little this morning.  It's usually in the morning but at 4:00 yesterday it happened again.  It doesn't last long."  Suggested she not blow her nose but gently clean it.  Also try a saline nasal spray to moisturize the area.  "I have Flonase and can use this.  I'm developing seasonal allergies and sneeze more too.  Thanks."  No further questions.

## 2015-12-30 ENCOUNTER — Encounter: Payer: Self-pay | Admitting: Internal Medicine

## 2015-12-30 ENCOUNTER — Other Ambulatory Visit (HOSPITAL_BASED_OUTPATIENT_CLINIC_OR_DEPARTMENT_OTHER): Payer: 59

## 2015-12-30 DIAGNOSIS — C3492 Malignant neoplasm of unspecified part of left bronchus or lung: Secondary | ICD-10-CM

## 2015-12-30 LAB — COMPREHENSIVE METABOLIC PANEL
ALBUMIN: 3.3 g/dL — AB (ref 3.5–5.0)
ALT: 35 U/L (ref 0–55)
AST: 21 U/L (ref 5–34)
Alkaline Phosphatase: 126 U/L (ref 40–150)
Anion Gap: 9 mEq/L (ref 3–11)
BUN: 13.5 mg/dL (ref 7.0–26.0)
CO2: 25 meq/L (ref 22–29)
Calcium: 9.7 mg/dL (ref 8.4–10.4)
Chloride: 104 mEq/L (ref 98–109)
Creatinine: 0.7 mg/dL (ref 0.6–1.1)
GLUCOSE: 108 mg/dL (ref 70–140)
POTASSIUM: 4.3 meq/L (ref 3.5–5.1)
SODIUM: 138 meq/L (ref 136–145)
TOTAL PROTEIN: 6.9 g/dL (ref 6.4–8.3)
Total Bilirubin: <0.3 mg/dL (ref 0.20–1.20)

## 2015-12-30 LAB — CBC WITH DIFFERENTIAL/PLATELET
BASO%: 0.4 % (ref 0.0–2.0)
BASOS ABS: 0.1 10*3/uL (ref 0.0–0.1)
EOS%: 0.5 % (ref 0.0–7.0)
Eosinophils Absolute: 0.1 10*3/uL (ref 0.0–0.5)
HEMATOCRIT: 35.5 % (ref 34.8–46.6)
HEMOGLOBIN: 12 g/dL (ref 11.6–15.9)
LYMPH#: 2.9 10*3/uL (ref 0.9–3.3)
LYMPH%: 14.2 % (ref 14.0–49.7)
MCH: 31.3 pg (ref 25.1–34.0)
MCHC: 33.8 g/dL (ref 31.5–36.0)
MCV: 92.7 fL (ref 79.5–101.0)
MONO#: 1.9 10*3/uL — AB (ref 0.1–0.9)
MONO%: 9.1 % (ref 0.0–14.0)
NEUT#: 15.5 10*3/uL — ABNORMAL HIGH (ref 1.5–6.5)
NEUT%: 75.8 % (ref 38.4–76.8)
NRBC: 1 % — AB (ref 0–0)
Platelets: 180 10*3/uL (ref 145–400)
RBC: 3.83 10*6/uL (ref 3.70–5.45)
RDW: 15 % — AB (ref 11.2–14.5)
WBC: 20.5 10*3/uL — ABNORMAL HIGH (ref 3.9–10.3)

## 2015-12-30 LAB — MAGNESIUM: Magnesium: 2.2 mg/dl (ref 1.5–2.5)

## 2015-12-30 NOTE — Progress Notes (Signed)
[facility] form left in my box- 12/25/15- left for dr. Mckinley Jewel to sign

## 2015-12-30 NOTE — Progress Notes (Signed)
[facility] form left in my box- 12/25/15- left for dr. Mckinley Jewel to sign- no signature needed faxed to [facility]  [phone]

## 2016-01-05 ENCOUNTER — Ambulatory Visit (HOSPITAL_BASED_OUTPATIENT_CLINIC_OR_DEPARTMENT_OTHER): Payer: 59 | Admitting: Internal Medicine

## 2016-01-05 ENCOUNTER — Encounter: Payer: Self-pay | Admitting: Internal Medicine

## 2016-01-05 ENCOUNTER — Ambulatory Visit (HOSPITAL_BASED_OUTPATIENT_CLINIC_OR_DEPARTMENT_OTHER): Payer: 59

## 2016-01-05 ENCOUNTER — Other Ambulatory Visit (HOSPITAL_BASED_OUTPATIENT_CLINIC_OR_DEPARTMENT_OTHER): Payer: 59

## 2016-01-05 VITALS — BP 145/65 | HR 83 | Temp 97.8°F | Resp 18 | Ht 66.0 in | Wt 264.3 lb

## 2016-01-05 DIAGNOSIS — Z5111 Encounter for antineoplastic chemotherapy: Secondary | ICD-10-CM

## 2016-01-05 DIAGNOSIS — C3492 Malignant neoplasm of unspecified part of left bronchus or lung: Secondary | ICD-10-CM

## 2016-01-05 DIAGNOSIS — C3412 Malignant neoplasm of upper lobe, left bronchus or lung: Secondary | ICD-10-CM

## 2016-01-05 HISTORY — DX: Encounter for antineoplastic chemotherapy: Z51.11

## 2016-01-05 LAB — CBC WITH DIFFERENTIAL/PLATELET
BASO%: 0.5 % (ref 0.0–2.0)
Basophils Absolute: 0.1 10*3/uL (ref 0.0–0.1)
EOS%: 0.7 % (ref 0.0–7.0)
Eosinophils Absolute: 0.1 10*3/uL (ref 0.0–0.5)
HCT: 36.7 % (ref 34.8–46.6)
HGB: 12.4 g/dL (ref 11.6–15.9)
LYMPH%: 15.1 % (ref 14.0–49.7)
MCH: 31.6 pg (ref 25.1–34.0)
MCHC: 33.8 g/dL (ref 31.5–36.0)
MCV: 93.4 fL (ref 79.5–101.0)
MONO#: 0.7 10*3/uL (ref 0.1–0.9)
MONO%: 6.5 % (ref 0.0–14.0)
NEUT%: 77.2 % — ABNORMAL HIGH (ref 38.4–76.8)
NEUTROS ABS: 8.2 10*3/uL — AB (ref 1.5–6.5)
Platelets: 312 10*3/uL (ref 145–400)
RBC: 3.93 10*6/uL (ref 3.70–5.45)
RDW: 15.1 % — ABNORMAL HIGH (ref 11.2–14.5)
WBC: 10.7 10*3/uL — AB (ref 3.9–10.3)
lymph#: 1.6 10*3/uL (ref 0.9–3.3)

## 2016-01-05 LAB — COMPREHENSIVE METABOLIC PANEL
ALT: 35 U/L (ref 0–55)
AST: 20 U/L (ref 5–34)
Albumin: 3.3 g/dL — ABNORMAL LOW (ref 3.5–5.0)
Alkaline Phosphatase: 110 U/L (ref 40–150)
Anion Gap: 10 mEq/L (ref 3–11)
BUN: 12.8 mg/dL (ref 7.0–26.0)
CO2: 25 meq/L (ref 22–29)
CREATININE: 0.8 mg/dL (ref 0.6–1.1)
Calcium: 9.6 mg/dL (ref 8.4–10.4)
Chloride: 105 mEq/L (ref 98–109)
EGFR: 87 mL/min/{1.73_m2} — AB (ref >90)
GLUCOSE: 148 mg/dL — AB (ref 70–140)
Potassium: 4 mEq/L (ref 3.5–5.1)
SODIUM: 140 meq/L (ref 136–145)
TOTAL PROTEIN: 7 g/dL (ref 6.4–8.3)

## 2016-01-05 LAB — MAGNESIUM: Magnesium: 1.9 mg/dl (ref 1.5–2.5)

## 2016-01-05 MED ORDER — SODIUM CHLORIDE 0.9 % IV SOLN
Freq: Once | INTRAVENOUS | Status: AC
  Administered 2016-01-05: 11:00:00 via INTRAVENOUS

## 2016-01-05 MED ORDER — SODIUM CHLORIDE 0.9 % IV SOLN
Freq: Once | INTRAVENOUS | Status: AC
  Administered 2016-01-05: 13:00:00 via INTRAVENOUS
  Filled 2016-01-05: qty 5

## 2016-01-05 MED ORDER — SODIUM CHLORIDE 0.9 % IV SOLN
120.0000 mg/m2 | Freq: Once | INTRAVENOUS | Status: AC
  Administered 2016-01-05: 280 mg via INTRAVENOUS
  Filled 2016-01-05: qty 14

## 2016-01-05 MED ORDER — PALONOSETRON HCL INJECTION 0.25 MG/5ML
0.2500 mg | Freq: Once | INTRAVENOUS | Status: AC
  Administered 2016-01-05: 0.25 mg via INTRAVENOUS

## 2016-01-05 MED ORDER — MANNITOL 25 % IV SOLN
Freq: Once | INTRAVENOUS | Status: AC
  Administered 2016-01-05: 11:00:00 via INTRAVENOUS
  Filled 2016-01-05: qty 10

## 2016-01-05 MED ORDER — CISPLATIN CHEMO INJECTION 100MG/100ML
60.0000 mg/m2 | Freq: Once | INTRAVENOUS | Status: AC
  Administered 2016-01-05: 140 mg via INTRAVENOUS
  Filled 2016-01-05: qty 140

## 2016-01-05 MED ORDER — PALONOSETRON HCL INJECTION 0.25 MG/5ML
INTRAVENOUS | Status: AC
  Filled 2016-01-05: qty 5

## 2016-01-05 NOTE — Progress Notes (Signed)
[facility] [phone]   [phone]  OFFICE PROGRESS NOTE  Sharon Amber, PA [place] [address]  DIAGNOSIS: limited stage (T2b, N2, M0) small cell lung cancer, pending further staging workup diagnosed in July 2017 and presented with large left upper lobe lung mass and mediastinal lymphadenopathy.  PRIOR THERAPY: None.  CURRENT THERAPY: Systemic chemotherapy with cisplatin 60 MG/M2 on day 1 and etoposide 120 MG/M2 on days 1, 2 and 3. Status post 2 cycles.  INTERVAL HISTORY: Sharon [name] 60 y.o. female returns to the clinic today for follow-up visit. The patient is feeling fine today was no specific complaints except for mild nausea after her treatment, resolved with Compazine. She is tolerating her treatment fairly well. She status post 2 cycles. She denied having any significant fever or chills. She has no nausea or vomiting. She denied having any significant chest pain, shortness breath, cough or hemoptysis. She has no significant weight loss or night sweats. She was supposed to have repeat CT scan of the chest before this visit but unfortunately it is scheduled. She is here for evaluation before starting cycle #3.  MEDICAL HISTORY: Past Medical History:  Diagnosis Date  . Cancer of right breast (Sharon [name]) 2009  . CAP (community acquired pneumonia) 11/06/2015  . COPD (chronic obstructive pulmonary disease) (Sharon [name])   . Elevated blood pressure   . Hoarseness of voice    "for the last 2 months" (11/06/2015)  . Lung mass dx'd 10/2015  . Menopause   . Pre-diabetes   . Small cell lung cancer (Sharon [name])   . Vitamin D deficiency     ALLERGIES:  has No Known Allergies.  MEDICATIONS:  Current Outpatient Prescriptions  Medication Sig Dispense Refill  . albuterol (PROVENTIL HFA;VENTOLIN HFA) 108 (90 Base) MCG/ACT inhaler Inhale 1 puff into the lungs every 6 (six) hours as needed for wheezing or shortness of breath.    .  budesonide-formoterol (SYMBICORT) 160-4.5 MCG/ACT inhaler Inhale 2 puffs into the lungs 2 (two) times daily.    . fluticasone (FLONASE) 50 MCG/ACT nasal spray Place into the nose.    . prochlorperazine (COMPAZINE) 10 MG tablet Take 1 tablet (10 mg total) by mouth every 6 (six) hours as needed for nausea or vomiting. 30 tablet 2   No current facility-administered medications for this visit.     SURGICAL HISTORY:  Past Surgical History:  Procedure Laterality Date  . BREAST BIOPSY Right 2009  . BREAST LUMPECTOMY Right 2009  . UTERINE FIBROID SURGERY  2000  . VAGINAL HYSTERECTOMY  2000  . VESICOVAGINAL FISTULA CLOSURE W/ TAH  2000  . VIDEO BRONCHOSCOPY WITH ENDOBRONCHIAL ULTRASOUND N/A 11/10/2015   Procedure: VIDEO BRONCHOSCOPY WITH ENDOBRONCHIAL ULTRASOUND;  Surgeon: Javier Glazier, MD;  Location: [facility];  Service: Thoracic;  Laterality: N/A;    REVIEW OF SYSTEMS:  A comprehensive review of systems was negative.   PHYSICAL EXAMINATION: General appearance: alert, cooperative and no distress Head: Normocephalic, without obvious abnormality, atraumatic Neck: no adenopathy, no JVD, supple, symmetrical, trachea midline and thyroid not enlarged, symmetric, no tenderness/mass/nodules Lymph nodes: Cervical, supraclavicular, and axillary nodes normal. Resp: clear to auscultation bilaterally Back: symmetric, no curvature. ROM normal. No CVA tenderness. Cardio: regular rate and rhythm, S1, S2 normal, no murmur, click, rub or gallop GI: soft, non-tender; bowel sounds normal; no masses,  no organomegaly Extremities: extremities normal, atraumatic, no cyanosis or edema  ECOG PERFORMANCE STATUS: 1 - Symptomatic but completely ambulatory  Blood  pressure (!) 145/65, pulse 83, temperature 97.8 F (36.6 C), resp. rate 18, height '5\' 6"'$  (1.676 m), weight 264 lb 4.8 oz (119.9 kg), SpO2 97 %.  LABORATORY DATA: Lab Results  Component Value Date   WBC 10.7 (H) 01/05/2016   HGB 12.4 01/05/2016   HCT  36.7 01/05/2016   MCV 93.4 01/05/2016   PLT 312 01/05/2016      Chemistry      Component Value Date/Time   NA 138 12/30/2015 1310   K 4.3 12/30/2015 1310   CL 102 11/07/2015 0140   CL 104 07/18/2012 1451   CO2 25 12/30/2015 1310   BUN 13.5 12/30/2015 1310   CREATININE 0.7 12/30/2015 1310      Component Value Date/Time   CALCIUM 9.7 12/30/2015 1310   ALKPHOS 126 12/30/2015 1310   AST 21 12/30/2015 1310   ALT 35 12/30/2015 1310   BILITOT <0.30 12/30/2015 1310       RADIOGRAPHIC STUDIES: No results found.  ASSESSMENT AND PLAN: This is a very pleasant 60 years old white female recently diagnosed with small cell lung cancer likely extensive stage disease with bilateral cervical lymphadenopathy and questionable adrenal lesion. Seen on the recent PET scan. MRI of the brain was unremarkable for any disease metastasis to the brain. The patient is currently undergoing systemic chemotherapy with cisplatin and etoposide status post 2 cycles. She is scheduled for repeat CT scan of the chest in 2 days. I recommended for the patient to proceed with cycle #3 today as a scheduled. She will call back for the scan results in 2 days. The patient would come back for follow-up visit in 3 weeks for evaluation before starting cycle #4. The patient was advised to call immediately if she has any concerning symptoms in the interval.  The patient voices understanding of current disease status and treatment options and is in agreement with the current care plan.  All questions were answered. The patient knows to call the clinic with any problems, questions or concerns. We can certainly see the patient much sooner if necessary.   Disclaimer: This note was dictated with voice recognition software. Similar sounding words can inadvertently be transcribed and may not be corrected upon review.

## 2016-01-05 NOTE — Patient Instructions (Signed)
[facility] Discharge Instructions for Patients Receiving Chemotherapy  Today you received the following chemotherapy agents: Etoposide and Cisplatin.  To help prevent nausea and vomiting after your treatment, we encourage you to take your nausea medication as directed.   If you develop nausea and vomiting that is not controlled by your nausea medication, call the clinic.   BELOW ARE SYMPTOMS THAT SHOULD BE REPORTED IMMEDIATELY:  *FEVER GREATER THAN 100.5 F  *CHILLS WITH OR WITHOUT FEVER  NAUSEA AND VOMITING THAT IS NOT CONTROLLED WITH YOUR NAUSEA MEDICATION  *UNUSUAL SHORTNESS OF BREATH  *UNUSUAL BRUISING OR BLEEDING  TENDERNESS IN MOUTH AND THROAT WITH OR WITHOUT PRESENCE OF ULCERS  *URINARY PROBLEMS  *BOWEL PROBLEMS  UNUSUAL RASH Items with * indicate a potential emergency and should be followed up as soon as possible.  Feel free to call the clinic you have any questions or concerns. The clinic phone number is [phone].  Please show the CHEMO ALERT CARD at check-in to the Emergency Department and triage nurse.   

## 2016-01-06 ENCOUNTER — Ambulatory Visit (HOSPITAL_BASED_OUTPATIENT_CLINIC_OR_DEPARTMENT_OTHER): Payer: 59

## 2016-01-06 VITALS — BP 140/64 | HR 70 | Temp 98.7°F | Resp 18

## 2016-01-06 DIAGNOSIS — C3491 Malignant neoplasm of unspecified part of right bronchus or lung: Secondary | ICD-10-CM | POA: Diagnosis not present

## 2016-01-06 DIAGNOSIS — C3492 Malignant neoplasm of unspecified part of left bronchus or lung: Secondary | ICD-10-CM

## 2016-01-06 DIAGNOSIS — Z5111 Encounter for antineoplastic chemotherapy: Secondary | ICD-10-CM

## 2016-01-06 MED ORDER — SODIUM CHLORIDE 0.9 % IV SOLN
Freq: Once | INTRAVENOUS | Status: AC
  Administered 2016-01-06: 15:00:00 via INTRAVENOUS

## 2016-01-06 MED ORDER — SODIUM CHLORIDE 0.9 % IV SOLN
120.0000 mg/m2 | Freq: Once | INTRAVENOUS | Status: AC
  Administered 2016-01-06: 280 mg via INTRAVENOUS
  Filled 2016-01-06: qty 14

## 2016-01-06 MED ORDER — SODIUM CHLORIDE 0.9 % IV SOLN
10.0000 mg | Freq: Once | INTRAVENOUS | Status: AC
  Administered 2016-01-06: 10 mg via INTRAVENOUS
  Filled 2016-01-06: qty 1

## 2016-01-06 NOTE — Patient Instructions (Signed)
[facility] Center Discharge Instructions for Patients Receiving Chemotherapy  Today you received the following chemotherapy agents: Etoposide   To help prevent nausea and vomiting after your treatment, we encourage you to take your nausea medication as directed.    If you develop nausea and vomiting that is not controlled by your nausea medication, call the clinic.   BELOW ARE SYMPTOMS THAT SHOULD BE REPORTED IMMEDIATELY:  *FEVER GREATER THAN 100.5 F  *CHILLS WITH OR WITHOUT FEVER  NAUSEA AND VOMITING THAT IS NOT CONTROLLED WITH YOUR NAUSEA MEDICATION  *UNUSUAL SHORTNESS OF BREATH  *UNUSUAL BRUISING OR BLEEDING  TENDERNESS IN MOUTH AND THROAT WITH OR WITHOUT PRESENCE OF ULCERS  *URINARY PROBLEMS  *BOWEL PROBLEMS  UNUSUAL RASH Items with * indicate a potential emergency and should be followed up as soon as possible.  Feel free to call the clinic you have any questions or concerns. The clinic phone number is [phone].  Please show the CHEMO ALERT CARD at check-in to the Emergency Department and triage nurse.   

## 2016-01-07 ENCOUNTER — Encounter (HOSPITAL_COMMUNITY): Payer: Self-pay

## 2016-01-07 ENCOUNTER — Ambulatory Visit (HOSPITAL_BASED_OUTPATIENT_CLINIC_OR_DEPARTMENT_OTHER): Payer: 59

## 2016-01-07 ENCOUNTER — Ambulatory Visit (HOSPITAL_COMMUNITY)
Admission: RE | Admit: 2016-01-07 | Discharge: 2016-01-07 | Disposition: A | Discharge status: Home / Self Care | Payer: 59 | Source: Ambulatory Visit | Attending: Nurse Practitioner | Admitting: Nurse Practitioner

## 2016-01-07 VITALS — BP 144/64 | HR 85 | Temp 97.1°F | Resp 16

## 2016-01-07 DIAGNOSIS — I700 Atherosclerosis of aorta: Secondary | ICD-10-CM | POA: Diagnosis not present

## 2016-01-07 DIAGNOSIS — K760 Fatty (change of) liver, not elsewhere classified: Secondary | ICD-10-CM | POA: Diagnosis not present

## 2016-01-07 DIAGNOSIS — C3492 Malignant neoplasm of unspecified part of left bronchus or lung: Secondary | ICD-10-CM | POA: Diagnosis not present

## 2016-01-07 DIAGNOSIS — Z9889 Other specified postprocedural states: Secondary | ICD-10-CM | POA: Diagnosis not present

## 2016-01-07 DIAGNOSIS — Z5111 Encounter for antineoplastic chemotherapy: Secondary | ICD-10-CM | POA: Diagnosis not present

## 2016-01-07 MED ORDER — SODIUM CHLORIDE 0.9 % IV SOLN
Freq: Once | INTRAVENOUS | Status: AC
  Administered 2016-01-07: 14:00:00 via INTRAVENOUS

## 2016-01-07 MED ORDER — SODIUM CHLORIDE 0.9 % IV SOLN
120.0000 mg/m2 | Freq: Once | INTRAVENOUS | Status: AC
  Administered 2016-01-07: 280 mg via INTRAVENOUS
  Filled 2016-01-07: qty 14

## 2016-01-07 MED ORDER — IOPAMIDOL (ISOVUE-300) INJECTION 61%
75.0000 mL | Freq: Once | INTRAVENOUS | Status: AC | PRN
  Administered 2016-01-07: 75 mL via INTRAVENOUS

## 2016-01-07 MED ORDER — SODIUM CHLORIDE 0.9 % IV SOLN
10.0000 mg | Freq: Once | INTRAVENOUS | Status: AC
  Administered 2016-01-07: 10 mg via INTRAVENOUS
  Filled 2016-01-07: qty 1

## 2016-01-07 NOTE — Patient Instructions (Signed)
Ondansetron injection What is this medicine? ONDANSETRON (on DAN se tron) is used to treat nausea and vomiting caused by chemotherapy. It is also used to prevent or treat nausea and vomiting after surgery. This medicine may be used for other purposes; ask your health care provider or pharmacist if you have questions. What should I tell my health care provider before I take this medicine? They need to know if you have any of these conditions: -heart disease -history of irregular heartbeat -liver disease -low levels of magnesium or potassium in the blood -an unusual or allergic reaction to ondansetron, granisetron, other medicines, foods, dyes, or preservatives -pregnant or trying to get pregnant -breast-feeding How should I use this medicine? This medicine is for infusion into a vein. It is given by a health care professional in a hospital or clinic setting. Talk to your pediatrician regarding the use of this medicine in children. Special care may be needed. Overdosage: If you think you have taken too much of this medicine contact a poison control center or emergency room at once. NOTE: This medicine is only for you. Do not share this medicine with others. What if I miss a dose? This does not apply. What may interact with this medicine? Do not take this medicine with any of the following medications: -apomorphine -certain medicines for fungal infections like fluconazole, itraconazole, ketoconazole, posaconazole, voriconazole -cisapride -dofetilide -dronedarone -pimozide -thioridazine -ziprasidone This medicine may also interact with the following medications: -carbamazepine -certain medicines for depression, anxiety, or psychotic disturbances -fentanyl -linezolid -MAOIs like Carbex, Eldepryl, Marplan, Nardil, and Parnate -methylene blue (injected into a vein) -other medicines that prolong the QT interval (cause an abnormal heart rhythm) -phenytoin -rifampicin -tramadol This  list may not describe all possible interactions. Give your health care provider a list of all the medicines, herbs, non-prescription drugs, or dietary supplements you use. Also tell them if you smoke, drink alcohol, or use illegal drugs. Some items may interact with your medicine. What should I watch for while using this medicine? Your condition will be monitored carefully while you are receiving this medicine. What side effects may I notice from receiving this medicine? Side effects that you should report to your doctor or health care professional as soon as possible: -allergic reactions like skin rash, itching or hives, swelling of the face, lips, or tongue -breathing problems -confusion -dizziness -fast or irregular heartbeat -feeling faint or lightheaded, falls -fever and chills -loss of balance or coordination -seizures -sweating -swelling of the hands and feet -tightness in the chest -tremors -unusually weak or tired Side effects that usually do not require medical attention (report to your doctor or health care professional if they continue or are bothersome): -constipation or diarrhea -headache This list may not describe all possible side effects. Call your doctor for medical advice about side effects. You may report side effects to FDA at 1-800-FDA-1088. Where should I keep my medicine? This drug is given in a hospital or clinic and will not be stored at home. NOTE: This sheet is a summary. It may not cover all possible information. If you have questions about this medicine, talk to your doctor, pharmacist, or health care provider.    2016, Elsevier/Gold Standard. (2013-01-17 16:18:28) Etoposide, VP-16 injection What is this medicine? ETOPOSIDE, VP-16 (e toe POE side) is a chemotherapy drug. It is used to treat testicular cancer, lung cancer, and other cancers. This medicine may be used for other purposes; ask your health care provider or pharmacist if you have  questions.  What should I tell my health care provider before I take this medicine? They need to know if you have any of these conditions: -infection -kidney disease -low blood counts, like low white cell, platelet, or red cell counts -an unusual or allergic reaction to etoposide, other chemotherapeutic agents, other medicines, foods, dyes, or preservatives -pregnant or trying to get pregnant -breast-feeding How should I use this medicine? This medicine is for infusion into a vein. It is administered in a hospital or clinic by a specially trained health care professional. Talk to your pediatrician regarding the use of this medicine in children. Special care may be needed. Overdosage: If you think you have taken too much of this medicine contact a poison control center or emergency room at once. NOTE: This medicine is only for you. Do not share this medicine with others. What if I miss a dose? It is important not to miss your dose. Call your doctor or health care professional if you are unable to keep an appointment. What may interact with this medicine? -aspirin -certain medications for seizures like carbamazepine, phenobarbital, phenytoin, valproic acid -cyclosporine -levamisole -warfarin This list may not describe all possible interactions. Give your health care provider a list of all the medicines, herbs, non-prescription drugs, or dietary supplements you use. Also tell them if you smoke, drink alcohol, or use illegal drugs. Some items may interact with your medicine. What should I watch for while using this medicine? Visit your doctor for checks on your progress. This drug may make you feel generally unwell. This is not uncommon, as chemotherapy can affect healthy cells as well as cancer cells. Report any side effects. Continue your course of treatment even though you feel ill unless your doctor tells you to stop. In some cases, you may be given additional medicines to help with side  effects. Follow all directions for their use. Call your doctor or health care professional for advice if you get a fever, chills or sore throat, or other symptoms of a cold or flu. Do not treat yourself. This drug decreases your body's ability to fight infections. Try to avoid being around people who are sick. This medicine may increase your risk to bruise or bleed. Call your doctor or health care professional if you notice any unusual bleeding. Be careful brushing and flossing your teeth or using a toothpick because you may get an infection or bleed more easily. If you have any dental work done, tell your dentist you are receiving this medicine. Avoid taking products that contain aspirin, acetaminophen, ibuprofen, naproxen, or ketoprofen unless instructed by your doctor. These medicines may hide a fever. Do not become pregnant while taking this medicine or for at least 6 months after stopping it. Women should inform their doctor if they wish to become pregnant or think they might be pregnant. Women of child-bearing potential will need to have a negative pregnancy test before starting this medicine. There is a potential for serious side effects to an unborn child. Talk to your health care professional or pharmacist for more information. Do not breast-feed an infant while taking this medicine. Men must use a latex condom during sexual contact with a woman while taking this medicine and for at least 4 months after stopping it. A latex condom is needed even if you have had a vasectomy. Contact your doctor right away if your partner becomes pregnant. Do not donate sperm while taking this medicine and for at least 4 months after you stop taking this medicine. Men  should inform their doctors if they wish to father a child. This medicine may lower sperm counts. What side effects may I notice from receiving this medicine? Side effects that you should report to your doctor or health care professional as soon as  possible: -allergic reactions like skin rash, itching or hives, swelling of the face, lips, or tongue -low blood counts - this medicine may decrease the number of white blood cells, red blood cells and platelets. You may be at increased risk for infections and bleeding. -signs of infection - fever or chills, cough, sore throat, pain or difficulty passing urine -signs of decreased platelets or bleeding - bruising, pinpoint red spots on the skin, black, tarry stools, blood in the urine -signs of decreased red blood cells - unusually weak or tired, fainting spells, lightheadedness -breathing problems -changes in vision -mouth or throat sores or ulcers -pain, redness, swelling or irritation at the injection site -pain, tingling, numbness in the hands or feet -redness, blistering, peeling or loosening of the skin, including inside the mouth -seizures -vomiting Side effects that usually do not require medical attention (report to your doctor or health care professional if they continue or are bothersome): -diarrhea -hair loss -loss of appetite -nausea -stomach pain This list may not describe all possible side effects. Call your doctor for medical advice about side effects. You may report side effects to FDA at 1-800-FDA-1088. Where should I keep my medicine? This drug is given in a hospital or clinic and will not be stored at home. NOTE: This sheet is a summary. It may not cover all possible information. If you have questions about this medicine, talk to your doctor, pharmacist, or health care provider.    2016, Elsevier/Gold Standard. (2013-12-06 12:32:50)

## 2016-01-09 ENCOUNTER — Ambulatory Visit (HOSPITAL_BASED_OUTPATIENT_CLINIC_OR_DEPARTMENT_OTHER): Payer: 59

## 2016-01-09 VITALS — BP 111/63 | HR 76 | Temp 98.2°F | Resp 16

## 2016-01-09 DIAGNOSIS — C3492 Malignant neoplasm of unspecified part of left bronchus or lung: Secondary | ICD-10-CM | POA: Diagnosis not present

## 2016-01-09 DIAGNOSIS — Z5189 Encounter for other specified aftercare: Secondary | ICD-10-CM | POA: Diagnosis not present

## 2016-01-09 MED ORDER — PEGFILGRASTIM INJECTION 6 MG/0.6ML ~~LOC~~
6.0000 mg | PREFILLED_SYRINGE | Freq: Once | SUBCUTANEOUS | Status: AC
  Administered 2016-01-09: 6 mg via SUBCUTANEOUS
  Filled 2016-01-09: qty 0.6

## 2016-01-09 NOTE — Patient Instructions (Signed)
Pegfilgrastim injection What is this medicine? PEGFILGRASTIM (PEG fil gra stim) is a long-acting granulocyte colony-stimulating factor that stimulates the growth of neutrophils, a type of white blood cell important in the body's fight against infection. It is used to reduce the incidence of fever and infection in patients with certain types of cancer who are receiving chemotherapy that affects the bone marrow, and to increase survival after being exposed to high doses of radiation. This medicine may be used for other purposes; ask your health care provider or pharmacist if you have questions. What should I tell my health care provider before I take this medicine? They need to know if you have any of these conditions: -kidney disease -latex allergy -ongoing radiation therapy -sickle cell disease -skin reactions to acrylic adhesives (On-Body Injector only) -an unusual or allergic reaction to pegfilgrastim, filgrastim, other medicines, foods, dyes, or preservatives -pregnant or trying to get pregnant -breast-feeding How should I use this medicine? This medicine is for injection under the skin. If you get this medicine at home, you will be taught how to prepare and give the pre-filled syringe or how to use the On-body Injector. Refer to the patient Instructions for Use for detailed instructions. Use exactly as directed. Take your medicine at regular intervals. Do not take your medicine more often than directed. It is important that you put your used needles and syringes in a special sharps container. Do not put them in a trash can. If you do not have a sharps container, call your pharmacist or healthcare provider to get one. Talk to your pediatrician regarding the use of this medicine in children. While this drug may be prescribed for selected conditions, precautions do apply. Overdosage: If you think you have taken too much of this medicine contact a poison control center or emergency room at  once. NOTE: This medicine is only for you. Do not share this medicine with others. What if I miss a dose? It is important not to miss your dose. Call your doctor or health care professional if you miss your dose. If you miss a dose due to an On-body Injector failure or leakage, a new dose should be administered as soon as possible using a single prefilled syringe for manual use. What may interact with this medicine? Interactions have not been studied. Give your health care provider a list of all the medicines, herbs, non-prescription drugs, or dietary supplements you use. Also tell them if you smoke, drink alcohol, or use illegal drugs. Some items may interact with your medicine. This list may not describe all possible interactions. Give your health care provider a list of all the medicines, herbs, non-prescription drugs, or dietary supplements you use. Also tell them if you smoke, drink alcohol, or use illegal drugs. Some items may interact with your medicine. What should I watch for while using this medicine? You may need blood work done while you are taking this medicine. If you are going to need a MRI, CT scan, or other procedure, tell your doctor that you are using this medicine (On-Body Injector only). What side effects may I notice from receiving this medicine? Side effects that you should report to your doctor or health care professional as soon as possible: -allergic reactions like skin rash, itching or hives, swelling of the face, lips, or tongue -dizziness -fever -pain, redness, or irritation at site where injected -pinpoint red spots on the skin -red or dark-brown urine -shortness of breath or breathing problems -stomach or side pain, or pain   at the shoulder -swelling -tiredness -trouble passing urine or change in the amount of urine Side effects that usually do not require medical attention (report to your doctor or health care professional if they continue or are  bothersome): -bone pain -muscle pain This list may not describe all possible side effects. Call your doctor for medical advice about side effects. You may report side effects to FDA at 1-800-FDA-1088. Where should I keep my medicine? Keep out of the reach of children. Store pre-filled syringes in a refrigerator between 2 and 8 degrees C (36 and 46 degrees F). Do not freeze. Keep in carton to protect from light. Throw away this medicine if it is left out of the refrigerator for more than 48 hours. Throw away any unused medicine after the expiration date. NOTE: This sheet is a summary. It may not cover all possible information. If you have questions about this medicine, talk to your doctor, pharmacist, or health care provider.    2016, [employer]vier/[employer] Standard. (2014-05-02 14:30:14)  

## 2016-01-12 ENCOUNTER — Other Ambulatory Visit (HOSPITAL_BASED_OUTPATIENT_CLINIC_OR_DEPARTMENT_OTHER): Payer: 59

## 2016-01-12 DIAGNOSIS — C3492 Malignant neoplasm of unspecified part of left bronchus or lung: Secondary | ICD-10-CM | POA: Diagnosis not present

## 2016-01-12 LAB — CBC WITH DIFFERENTIAL/PLATELET
BASO%: 0.3 % (ref 0.0–2.0)
BASOS ABS: 0.1 10*3/uL (ref 0.0–0.1)
EOS%: 0.1 % (ref 0.0–7.0)
Eosinophils Absolute: 0 10*3/uL (ref 0.0–0.5)
HEMATOCRIT: 33.6 % — AB (ref 34.8–46.6)
HGB: 11.5 g/dL — ABNORMAL LOW (ref 11.6–15.9)
LYMPH#: 1.5 10*3/uL (ref 0.9–3.3)
LYMPH%: 8 % — AB (ref 14.0–49.7)
MCH: 31.8 pg (ref 25.1–34.0)
MCHC: 34.2 g/dL (ref 31.5–36.0)
MCV: 92.8 fL (ref 79.5–101.0)
MONO#: 0.4 10*3/uL (ref 0.1–0.9)
MONO%: 2 % (ref 0.0–14.0)
NEUT#: 16.7 10*3/uL — ABNORMAL HIGH (ref 1.5–6.5)
NEUT%: 89.6 % — AB (ref 38.4–76.8)
PLATELETS: 242 10*3/uL (ref 145–400)
RBC: 3.62 10*6/uL — AB (ref 3.70–5.45)
RDW: 15.3 % — ABNORMAL HIGH (ref 11.2–14.5)
WBC: 18.7 10*3/uL — ABNORMAL HIGH (ref 3.9–10.3)

## 2016-01-12 LAB — MAGNESIUM: MAGNESIUM: 2.1 mg/dL (ref 1.5–2.5)

## 2016-01-12 LAB — COMPREHENSIVE METABOLIC PANEL
ALT: 35 U/L (ref 0–55)
ANION GAP: 8 meq/L (ref 3–11)
AST: 18 U/L (ref 5–34)
Albumin: 3.3 g/dL — ABNORMAL LOW (ref 3.5–5.0)
Alkaline Phosphatase: 144 U/L (ref 40–150)
BUN: 14.8 mg/dL (ref 7.0–26.0)
CALCIUM: 9.4 mg/dL (ref 8.4–10.4)
CHLORIDE: 103 meq/L (ref 98–109)
CO2: 26 mEq/L (ref 22–29)
Creatinine: 0.7 mg/dL (ref 0.6–1.1)
Glucose: 112 mg/dl (ref 70–140)
POTASSIUM: 4.3 meq/L (ref 3.5–5.1)
Sodium: 138 mEq/L (ref 136–145)
Total Bilirubin: 0.64 mg/dL (ref 0.20–1.20)
Total Protein: 6.6 g/dL (ref 6.4–8.3)

## 2016-01-19 ENCOUNTER — Other Ambulatory Visit (HOSPITAL_BASED_OUTPATIENT_CLINIC_OR_DEPARTMENT_OTHER): Payer: 59

## 2016-01-19 DIAGNOSIS — C3492 Malignant neoplasm of unspecified part of left bronchus or lung: Secondary | ICD-10-CM

## 2016-01-19 LAB — COMPREHENSIVE METABOLIC PANEL
ALBUMIN: 3.2 g/dL — AB (ref 3.5–5.0)
ALT: 29 U/L (ref 0–55)
AST: 15 U/L (ref 5–34)
Alkaline Phosphatase: 133 U/L (ref 40–150)
Anion Gap: 10 mEq/L (ref 3–11)
BUN: 11.3 mg/dL (ref 7.0–26.0)
CALCIUM: 9.2 mg/dL (ref 8.4–10.4)
CHLORIDE: 103 meq/L (ref 98–109)
CO2: 24 mEq/L (ref 22–29)
Creatinine: 0.7 mg/dL (ref 0.6–1.1)
Glucose: 99 mg/dl (ref 70–140)
POTASSIUM: 4 meq/L (ref 3.5–5.1)
SODIUM: 137 meq/L (ref 136–145)
Total Bilirubin: <0.3 mg/dL (ref 0.20–1.20)
Total Protein: 6.7 g/dL (ref 6.4–8.3)

## 2016-01-19 LAB — CBC WITH DIFFERENTIAL/PLATELET
BASO%: 0.6 % (ref 0.0–2.0)
BASOS ABS: 0 10*3/uL (ref 0.0–0.1)
EOS ABS: 0.1 10*3/uL (ref 0.0–0.5)
EOS%: 1 % (ref 0.0–7.0)
HEMATOCRIT: 33.9 % — AB (ref 34.8–46.6)
HGB: 11.4 g/dL — ABNORMAL LOW (ref 11.6–15.9)
LYMPH%: 19.8 % (ref 14.0–49.7)
MCH: 31.9 pg (ref 25.1–34.0)
MCHC: 33.8 g/dL (ref 31.5–36.0)
MCV: 94.5 fL (ref 79.5–101.0)
MONO#: 1.2 10*3/uL — ABNORMAL HIGH (ref 0.1–0.9)
MONO%: 14.5 % — AB (ref 0.0–14.0)
NEUT#: 5.4 10*3/uL (ref 1.5–6.5)
NEUT%: 64.1 % (ref 38.4–76.8)
Platelets: 227 10*3/uL (ref 145–400)
RBC: 3.58 10*6/uL — ABNORMAL LOW (ref 3.70–5.45)
RDW: 16.7 % — ABNORMAL HIGH (ref 11.2–14.5)
WBC: 8.5 10*3/uL (ref 3.9–10.3)
lymph#: 1.7 10*3/uL (ref 0.9–3.3)

## 2016-01-19 LAB — MAGNESIUM: Magnesium: 2 mg/dl (ref 1.5–2.5)

## 2016-01-23 ENCOUNTER — Other Ambulatory Visit: Payer: Self-pay | Admitting: Medical Oncology

## 2016-01-23 DIAGNOSIS — C3492 Malignant neoplasm of unspecified part of left bronchus or lung: Secondary | ICD-10-CM

## 2016-01-23 MED ORDER — PROCHLORPERAZINE MALEATE 10 MG PO TABS: 10.0000 mg | ORAL_TABLET | Freq: Four times a day (QID) | ORAL | 1 refills | Status: DC | PRN

## 2016-01-26 ENCOUNTER — Other Ambulatory Visit (HOSPITAL_BASED_OUTPATIENT_CLINIC_OR_DEPARTMENT_OTHER): Payer: 59

## 2016-01-26 ENCOUNTER — Ambulatory Visit (HOSPITAL_BASED_OUTPATIENT_CLINIC_OR_DEPARTMENT_OTHER): Payer: 59

## 2016-01-26 ENCOUNTER — Ambulatory Visit (HOSPITAL_BASED_OUTPATIENT_CLINIC_OR_DEPARTMENT_OTHER): Payer: 59 | Admitting: Oncology

## 2016-01-26 VITALS — BP 133/61 | HR 78 | Temp 97.4°F | Resp 17 | Ht 66.0 in | Wt 266.4 lb

## 2016-01-26 DIAGNOSIS — C3492 Malignant neoplasm of unspecified part of left bronchus or lung: Secondary | ICD-10-CM

## 2016-01-26 DIAGNOSIS — C3412 Malignant neoplasm of upper lobe, left bronchus or lung: Secondary | ICD-10-CM | POA: Diagnosis not present

## 2016-01-26 DIAGNOSIS — Z5111 Encounter for antineoplastic chemotherapy: Secondary | ICD-10-CM | POA: Diagnosis not present

## 2016-01-26 LAB — COMPREHENSIVE METABOLIC PANEL
ALBUMIN: 3.3 g/dL — AB (ref 3.5–5.0)
ALK PHOS: 114 U/L (ref 40–150)
ALT: 27 U/L (ref 0–55)
ANION GAP: 12 meq/L — AB (ref 3–11)
AST: 17 U/L (ref 5–34)
BUN: 14.5 mg/dL (ref 7.0–26.0)
CALCIUM: 9.5 mg/dL (ref 8.4–10.4)
CHLORIDE: 105 meq/L (ref 98–109)
CO2: 24 mEq/L (ref 22–29)
Creatinine: 0.7 mg/dL (ref 0.6–1.1)
Glucose: 140 mg/dl (ref 70–140)
POTASSIUM: 4.2 meq/L (ref 3.5–5.1)
Sodium: 140 mEq/L (ref 136–145)
Total Bilirubin: <0.3 mg/dL (ref 0.20–1.20)
Total Protein: 7 g/dL (ref 6.4–8.3)

## 2016-01-26 LAB — MAGNESIUM: MAGNESIUM: 2.1 mg/dL (ref 1.5–2.5)

## 2016-01-26 LAB — CBC WITH DIFFERENTIAL/PLATELET
BASO%: 0.7 % (ref 0.0–2.0)
BASOS ABS: 0.1 10*3/uL (ref 0.0–0.1)
EOS%: 1 % (ref 0.0–7.0)
Eosinophils Absolute: 0.1 10*3/uL (ref 0.0–0.5)
HEMATOCRIT: 36.9 % (ref 34.8–46.6)
HEMOGLOBIN: 12.4 g/dL (ref 11.6–15.9)
LYMPH#: 1.9 10*3/uL (ref 0.9–3.3)
LYMPH%: 16.9 % (ref 14.0–49.7)
MCH: 32.2 pg (ref 25.1–34.0)
MCHC: 33.7 g/dL (ref 31.5–36.0)
MCV: 95.6 fL (ref 79.5–101.0)
MONO#: 0.9 10*3/uL (ref 0.1–0.9)
MONO%: 7.7 % (ref 0.0–14.0)
NEUT#: 8.4 10*3/uL — ABNORMAL HIGH (ref 1.5–6.5)
NEUT%: 73.7 % (ref 38.4–76.8)
Platelets: 349 10*3/uL (ref 145–400)
RBC: 3.86 10*6/uL (ref 3.70–5.45)
RDW: 17.8 % — ABNORMAL HIGH (ref 11.2–14.5)
WBC: 11.4 10*3/uL — ABNORMAL HIGH (ref 3.9–10.3)

## 2016-01-26 MED ORDER — SODIUM CHLORIDE 0.9 % IV SOLN
Freq: Once | INTRAVENOUS | Status: AC
  Administered 2016-01-26: 14:00:00 via INTRAVENOUS
  Filled 2016-01-26: qty 5

## 2016-01-26 MED ORDER — POTASSIUM CHLORIDE 2 MEQ/ML IV SOLN
Freq: Once | INTRAVENOUS | Status: AC
  Administered 2016-01-26: 11:00:00 via INTRAVENOUS
  Filled 2016-01-26: qty 10

## 2016-01-26 MED ORDER — SODIUM CHLORIDE 0.9 % IV SOLN
60.0000 mg/m2 | Freq: Once | INTRAVENOUS | Status: AC
  Administered 2016-01-26: 140 mg via INTRAVENOUS
  Filled 2016-01-26: qty 140

## 2016-01-26 MED ORDER — SODIUM CHLORIDE 0.9 % IV SOLN
Freq: Once | INTRAVENOUS | Status: AC
  Administered 2016-01-26: 11:00:00 via INTRAVENOUS

## 2016-01-26 MED ORDER — PALONOSETRON HCL INJECTION 0.25 MG/5ML
INTRAVENOUS | Status: AC
  Filled 2016-01-26: qty 5

## 2016-01-26 MED ORDER — PALONOSETRON HCL INJECTION 0.25 MG/5ML
0.2500 mg | Freq: Once | INTRAVENOUS | Status: AC
  Administered 2016-01-26: 0.25 mg via INTRAVENOUS

## 2016-01-26 MED ORDER — SODIUM CHLORIDE 0.9 % IV SOLN
120.0000 mg/m2 | Freq: Once | INTRAVENOUS | Status: AC
  Administered 2016-01-26: 280 mg via INTRAVENOUS
  Filled 2016-01-26: qty 14

## 2016-01-26 NOTE — Progress Notes (Signed)
[facility] [phone]   [phone]  OFFICE PROGRESS NOTE  [name], PA [place] [address]  DIAGNOSIS: limited stage (T2b, N2, M0) small cell lung cancer, pending further staging workup diagnosed in July 2017 and presented with large left upper lobe lung mass and mediastinal lymphadenopathy.  PRIOR THERAPY: None.  CURRENT THERAPY: Systemic chemotherapy with cisplatin 60 MG/M2 on day 1 and etoposide 120 MG/M2 on days 1, 2 and 3. Status post 3 cycles.  INTERVAL HISTORY: Sharon [name] 60 y.o. female returns to the clinic today for follow-up visit. The patient is feeling fine today was no specific complaints except for mild nausea after her treatment, resolved with Compazine. Has been having an increased cough. particularly in the morning. Coughs up green sputum which clears as the day goes on. She is tolerating her treatment fairly well. She status post 3 cycles. She denied having any significant fever or chills. She has no nausea or vomiting. She denied having any significant chest pain, shortness breath, or hemoptysis. She has no significant weight loss or night sweats. She had a CT of the chest since her last visit and is here to discuss the results and for evaluation before starting cycle #4.  MEDICAL HISTORY: Past Medical History:  Diagnosis Date  . Cancer of right breast ([facility]) 2009  . CAP (community acquired pneumonia) 11/06/2015  . COPD (chronic obstructive pulmonary disease) ([facility])   . Elevated blood pressure   . Encounter for antineoplastic chemotherapy 01/05/2016  . Hoarseness of voice    "for the last 2 months" (11/06/2015)  . Lung mass dx'd 10/2015  . Menopause   . Pre-diabetes   . Small cell lung cancer ([facility])   . Vitamin D deficiency     ALLERGIES:  has No Known Allergies.  MEDICATIONS:  Current Outpatient Prescriptions  Medication Sig Dispense Refill  . albuterol (PROVENTIL HFA;VENTOLIN HFA) 108 (90  Base) MCG/ACT inhaler Inhale 1 puff into the lungs every 6 (six) hours as needed for wheezing or shortness of breath.    . budesonide-formoterol (SYMBICORT) 160-4.5 MCG/ACT inhaler Inhale 2 puffs into the lungs 2 (two) times daily.    . fluticasone (FLONASE) 50 MCG/ACT nasal spray Place into the nose.    . montelukast (SINGULAIR) 10 MG tablet Take 10 mg by mouth as directed.    . prochlorperazine (COMPAZINE) 10 MG tablet Take 1 tablet (10 mg total) by mouth every 6 (six) hours as needed for nausea or vomiting. 60 tablet 1   No current facility-administered medications for this visit.    Facility-Administered Medications Ordered in Other Visits  Medication Dose Route Frequency Provider Last Rate Last Dose  . CISplatin (PLATINOL) 140 mg in sodium chloride 0.9 % 500 mL chemo infusion  60 mg/m2 (Treatment Plan Recorded) Intravenous Once Curt Bears, MD      . etoposide (VEPESID) 280 mg in sodium chloride 0.9 % 700 mL chemo infusion  120 mg/m2 (Treatment Plan Recorded) Intravenous Once Curt Bears, MD        SURGICAL HISTORY:  Past Surgical History:  Procedure Laterality Date  . BREAST BIOPSY Right 2009  . BREAST LUMPECTOMY Right 2009  . UTERINE FIBROID SURGERY  2000  . VAGINAL HYSTERECTOMY  2000  . VESICOVAGINAL FISTULA CLOSURE W/ TAH  2000  . VIDEO BRONCHOSCOPY WITH ENDOBRONCHIAL ULTRASOUND N/A 11/10/2015   Procedure: VIDEO BRONCHOSCOPY WITH ENDOBRONCHIAL ULTRASOUND;  Surgeon: Javier Glazier, MD;  Location: [facility];  Service:  Thoracic;  Laterality: N/A;    REVIEW OF SYSTEMS:  A comprehensive review of systems was negative.   PHYSICAL EXAMINATION: General appearance: alert, cooperative and no distress Head: Normocephalic, without obvious abnormality, atraumatic Neck: no adenopathy, no JVD, supple, symmetrical, trachea midline and thyroid not enlarged, symmetric, no tenderness/mass/nodules Lymph nodes: Cervical, supraclavicular, and axillary nodes normal. Resp: clear to  auscultation bilaterally Back: symmetric, no curvature. ROM normal. No CVA tenderness. Cardio: regular rate and rhythm, S1, S2 normal, no murmur, click, rub or gallop GI: soft, non-tender; bowel sounds normal; no masses,  no organomegaly Extremities: extremities normal, atraumatic, no cyanosis or edema  ECOG PERFORMANCE STATUS: 1 - Symptomatic but completely ambulatory  Blood pressure 133/61, pulse 78, temperature 97.4 F (36.3 C), temperature source Oral, resp. rate 17, height '5\' 6"'$  (1.676 m), weight 266 lb 6.4 oz (120.8 kg), SpO2 99 %.  LABORATORY DATA: Lab Results  Component Value Date   WBC 11.4 (H) 01/26/2016   HGB 12.4 01/26/2016   HCT 36.9 01/26/2016   MCV 95.6 01/26/2016   PLT 349 01/26/2016      Chemistry      Component Value Date/Time   NA 140 01/26/2016 0900   K 4.2 01/26/2016 0900   CL 102 11/07/2015 0140   CL 104 07/18/2012 1451   CO2 24 01/26/2016 0900   BUN 14.5 01/26/2016 0900   CREATININE 0.7 01/26/2016 0900      Component Value Date/Time   CALCIUM 9.5 01/26/2016 0900   ALKPHOS 114 01/26/2016 0900   AST 17 01/26/2016 0900   ALT 27 01/26/2016 0900   BILITOT <0.30 01/26/2016 0900       RADIOGRAPHIC STUDIES: Ct Chest W Contrast  Result Date: 01/07/2016 CLINICAL DATA:  60 year old female with history of small-cell left-sided lung cancer diagnosed in July 2017. History of right-sided breast cancer diagnosed 9 years ago, status post right-sided lumpectomy. Radiation therapy complete. Chemotherapy in progress. EXAM: CT CHEST WITH CONTRAST TECHNIQUE: Multidetector CT imaging of the chest was performed during intravenous contrast administration. CONTRAST:  17m ISOVUE-300 IOPAMIDOL (ISOVUE-300) INJECTION 61% COMPARISON:  PET-CT 11/19/2015. FINDINGS: Cardiovascular: Heart size is borderline enlarged. There is no significant pericardial fluid, thickening or pericardial calcification. Aortic atherosclerosis. Bovine type thoracic aortic arch (normal anatomical  variant) incidentally noted. Mediastinum/Nodes: No pathologically enlarged mediastinal or hilar lymph nodes. Esophagus is unremarkable in appearance. No axillary lymphadenopathy. Lungs/Pleura: Previously noted left upper lobe mass has significantly regressed, currently a 2.0 x 1.1 x 1.8 cm macrolobulated nodule with spiculated margins and overlying pleural retraction. The surrounding interstitial and airspace disease adjacent to the lesion noted on the prior PET-CT has nearly completely resolved. No new suspicious appearing pulmonary nodules or masses are otherwise noted. No new acute consolidative airspace disease. No pleural effusions. Upper Abdomen: Diffuse low attenuation throughout the hepatic parenchyma, compatible with a background of hepatic steatosis. Focal area of fatty sparing in the right lobe of the liver, similar to prior PET-CT. Small calcified granuloma in the superior aspect of the right lobe of the liver incidentally noted. Musculoskeletal: There are no aggressive appearing lytic or blastic lesions noted in the visualized portions of the skeleton. IMPRESSION: 1. Today's study demonstrates marked positive response to therapy with significant regression of the previously noted left upper lobe pulmonary mass which is currently a 2.0 x 1.1 x 1.8 cm nodule, and resolution of previously noted mediastinal and left hilar lymphadenopathy. 2. Previously noted left pleural effusion has also resolved. 3. Aortic atherosclerosis. 4. Hepatic steatosis. 5. Adrenal glands are  grossly unremarkable on today's examination. Electronically Signed   By: Vinnie Langton M.D.   On: 01/07/2016 14:38    ASSESSMENT AND PLAN: This is a very pleasant 60 year old white female recently diagnosed with small cell lung cancer likely extensive stage disease with bilateral cervical lymphadenopathy and questionable adrenal lesion. Seen on the recent PET scan. MRI of the brain was unremarkable for any disease metastasis to the  brain. The patient is currently undergoing systemic chemotherapy with cisplatin and etoposide status post 3 cycles. Repeat CT scan of the chest was reviewed with the patient showing a marked response to therapy. I recommended for the patient to proceed with cycle #4 today as a scheduled.  The patient will come back for weekly labs and then a follow-up visit in 3 weeks for evaluation before starting cycle #5.  She will let us know if her cough worsens or if she develops any fevers.   The patient was advised to call immediately if she has any concerning symptoms in the interval.  The patient voices understanding of current disease status and treatment options and is in agreement with the current care plan.  All questions were answered. The patient knows to call the clinic with any problems, questions or concerns. We can certainly see the patient much sooner if necessary.  Mikey Bussing, DNP, AGPCNP-BC, AOCNP

## 2016-01-26 NOTE — Patient Instructions (Signed)
[facility] Discharge Instructions for Patients Receiving Chemotherapy  Today you received the following chemotherapy agents: Cisplatin and Vepesid. To help prevent nausea and vomiting after your treatment, we encourage you to take your nausea medication as directed. If you develop nausea and vomiting that is not controlled by your nausea medication, call the clinic.   BELOW ARE SYMPTOMS THAT SHOULD BE REPORTED IMMEDIATELY:  *FEVER GREATER THAN 100.5 F  *CHILLS WITH OR WITHOUT FEVER  NAUSEA AND VOMITING THAT IS NOT CONTROLLED WITH YOUR NAUSEA MEDICATION  *UNUSUAL SHORTNESS OF BREATH  *UNUSUAL BRUISING OR BLEEDING  TENDERNESS IN MOUTH AND THROAT WITH OR WITHOUT PRESENCE OF ULCERS  *URINARY PROBLEMS  *BOWEL PROBLEMS  UNUSUAL RASH Items with * indicate a potential emergency and should be followed up as soon as possible.  Feel free to call the clinic you have any questions or concerns. The clinic phone number is [phone].  Please show the [facility] at check-in to the Emergency Department and triage nurse.

## 2016-01-27 ENCOUNTER — Ambulatory Visit (HOSPITAL_BASED_OUTPATIENT_CLINIC_OR_DEPARTMENT_OTHER): Payer: 59

## 2016-01-27 VITALS — HR 68 | Temp 98.1°F | Resp 16

## 2016-01-27 DIAGNOSIS — C3492 Malignant neoplasm of unspecified part of left bronchus or lung: Secondary | ICD-10-CM

## 2016-01-27 DIAGNOSIS — Z5111 Encounter for antineoplastic chemotherapy: Secondary | ICD-10-CM

## 2016-01-27 MED ORDER — SODIUM CHLORIDE 0.9 % IV SOLN
120.0000 mg/m2 | Freq: Once | INTRAVENOUS | Status: AC
  Administered 2016-01-27: 280 mg via INTRAVENOUS
  Filled 2016-01-27: qty 14

## 2016-01-27 MED ORDER — SODIUM CHLORIDE 0.9 % IV SOLN
Freq: Once | INTRAVENOUS | Status: AC
  Administered 2016-01-27: 14:00:00 via INTRAVENOUS

## 2016-01-27 MED ORDER — SODIUM CHLORIDE 0.9 % IV SOLN
10.0000 mg | Freq: Once | INTRAVENOUS | Status: AC
  Administered 2016-01-27: 10 mg via INTRAVENOUS
  Filled 2016-01-27: qty 1

## 2016-01-27 NOTE — Patient Instructions (Signed)
[facility] Discharge Instructions for Patients Receiving Chemotherapy  Today you received the following chemotherapy agents: Etoposide   To help prevent nausea and vomiting after your treatment, we encourage you to take your nausea medication as directed.    If you develop nausea and vomiting that is not controlled by your nausea medication, call the clinic.   BELOW ARE SYMPTOMS THAT SHOULD BE REPORTED IMMEDIATELY:  *FEVER GREATER THAN 100.5 F  *CHILLS WITH OR WITHOUT FEVER  NAUSEA AND VOMITING THAT IS NOT CONTROLLED WITH YOUR NAUSEA MEDICATION  *UNUSUAL SHORTNESS OF BREATH  *UNUSUAL BRUISING OR BLEEDING  TENDERNESS IN MOUTH AND THROAT WITH OR WITHOUT PRESENCE OF ULCERS  *URINARY PROBLEMS  *BOWEL PROBLEMS  UNUSUAL RASH Items with * indicate a potential emergency and should be followed up as soon as possible.  Feel free to call the clinic you have any questions or concerns. The clinic phone number is [phone].  Please show the CHEMO ALERT CARD at check-in to the Emergency Department and triage nurse.   

## 2016-01-28 ENCOUNTER — Ambulatory Visit (HOSPITAL_BASED_OUTPATIENT_CLINIC_OR_DEPARTMENT_OTHER): Payer: 59

## 2016-01-28 VITALS — BP 136/58 | HR 63 | Temp 97.7°F | Resp 17

## 2016-01-28 DIAGNOSIS — Z5111 Encounter for antineoplastic chemotherapy: Secondary | ICD-10-CM | POA: Diagnosis not present

## 2016-01-28 DIAGNOSIS — Z23 Encounter for immunization: Secondary | ICD-10-CM | POA: Diagnosis not present

## 2016-01-28 DIAGNOSIS — C3492 Malignant neoplasm of unspecified part of left bronchus or lung: Secondary | ICD-10-CM | POA: Diagnosis not present

## 2016-01-28 MED ORDER — SODIUM CHLORIDE 0.9 % IV SOLN
Freq: Once | INTRAVENOUS | Status: AC
  Administered 2016-01-28: 15:00:00 via INTRAVENOUS

## 2016-01-28 MED ORDER — INFLUENZA VAC SPLIT QUAD 0.5 ML IM SUSY
0.5000 mL | PREFILLED_SYRINGE | Freq: Once | INTRAMUSCULAR | Status: AC
  Administered 2016-01-28: 0.5 mL via INTRAMUSCULAR
  Filled 2016-01-28: qty 0.5

## 2016-01-28 MED ORDER — SODIUM CHLORIDE 0.9 % IV SOLN
10.0000 mg | Freq: Once | INTRAVENOUS | Status: AC
  Administered 2016-01-28: 10 mg via INTRAVENOUS
  Filled 2016-01-28: qty 1

## 2016-01-28 MED ORDER — SODIUM CHLORIDE 0.9 % IV SOLN
120.0000 mg/m2 | Freq: Once | INTRAVENOUS | Status: AC
  Administered 2016-01-28: 280 mg via INTRAVENOUS
  Filled 2016-01-28: qty 14

## 2016-01-28 NOTE — Patient Instructions (Signed)
[facility] Discharge Instructions for Patients Receiving Chemotherapy  Today you received the following chemotherapy agents: Etoposide   To help prevent nausea and vomiting after your treatment, we encourage you to take your nausea medication as directed.    If you develop nausea and vomiting that is not controlled by your nausea medication, call the clinic.   BELOW ARE SYMPTOMS THAT SHOULD BE REPORTED IMMEDIATELY:  *FEVER GREATER THAN 100.5 F  *CHILLS WITH OR WITHOUT FEVER  NAUSEA AND VOMITING THAT IS NOT CONTROLLED WITH YOUR NAUSEA MEDICATION  *UNUSUAL SHORTNESS OF BREATH  *UNUSUAL BRUISING OR BLEEDING  TENDERNESS IN MOUTH AND THROAT WITH OR WITHOUT PRESENCE OF ULCERS  *URINARY PROBLEMS  *BOWEL PROBLEMS  UNUSUAL RASH Items with * indicate a potential emergency and should be followed up as soon as possible.  Feel free to call the clinic you have any questions or concerns. The clinic phone number is [phone].  Please show the CHEMO ALERT CARD at check-in to the Emergency Department and triage nurse.   

## 2016-01-30 ENCOUNTER — Ambulatory Visit (HOSPITAL_BASED_OUTPATIENT_CLINIC_OR_DEPARTMENT_OTHER): Payer: 59

## 2016-01-30 VITALS — BP 148/73 | HR 74 | Temp 97.6°F | Resp 18

## 2016-01-30 DIAGNOSIS — C3492 Malignant neoplasm of unspecified part of left bronchus or lung: Secondary | ICD-10-CM

## 2016-01-30 DIAGNOSIS — Z5189 Encounter for other specified aftercare: Secondary | ICD-10-CM | POA: Diagnosis not present

## 2016-01-30 MED ORDER — PEGFILGRASTIM INJECTION 6 MG/0.6ML ~~LOC~~
6.0000 mg | PREFILLED_SYRINGE | Freq: Once | SUBCUTANEOUS | Status: AC
  Administered 2016-01-30: 6 mg via SUBCUTANEOUS
  Filled 2016-01-30: qty 0.6

## 2016-01-30 NOTE — Patient Instructions (Signed)
Pegfilgrastim injection What is this medicine? PEGFILGRASTIM (PEG fil gra stim) is a long-acting granulocyte colony-stimulating factor that stimulates the growth of neutrophils, a type of white blood cell important in the body's fight against infection. It is used to reduce the incidence of fever and infection in patients with certain types of cancer who are receiving chemotherapy that affects the bone marrow, and to increase survival after being exposed to high doses of radiation. This medicine may be used for other purposes; ask your health care provider or pharmacist if you have questions. What should I tell my health care provider before I take this medicine? They need to know if you have any of these conditions: -kidney disease -latex allergy -ongoing radiation therapy -sickle cell disease -skin reactions to acrylic adhesives (On-Body Injector only) -an unusual or allergic reaction to pegfilgrastim, filgrastim, other medicines, foods, dyes, or preservatives -pregnant or trying to get pregnant -breast-feeding How should I use this medicine? This medicine is for injection under the skin. If you get this medicine at home, you will be taught how to prepare and give the pre-filled syringe or how to use the On-body Injector. Refer to the patient Instructions for Use for detailed instructions. Use exactly as directed. Take your medicine at regular intervals. Do not take your medicine more often than directed. It is important that you put your used needles and syringes in a special sharps container. Do not put them in a trash can. If you do not have a sharps container, call your pharmacist or healthcare provider to get one. Talk to your pediatrician regarding the use of this medicine in children. While this drug may be prescribed for selected conditions, precautions do apply. Overdosage: If you think you have taken too much of this medicine contact a poison control center or emergency room at  once. NOTE: This medicine is only for you. Do not share this medicine with others. What if I miss a dose? It is important not to miss your dose. Call your doctor or health care professional if you miss your dose. If you miss a dose due to an On-body Injector failure or leakage, a new dose should be administered as soon as possible using a single prefilled syringe for manual use. What may interact with this medicine? Interactions have not been studied. Give your health care provider a list of all the medicines, herbs, non-prescription drugs, or dietary supplements you use. Also tell them if you smoke, drink alcohol, or use illegal drugs. Some items may interact with your medicine. This list may not describe all possible interactions. Give your health care provider a list of all the medicines, herbs, non-prescription drugs, or dietary supplements you use. Also tell them if you smoke, drink alcohol, or use illegal drugs. Some items may interact with your medicine. What should I watch for while using this medicine? You may need blood work done while you are taking this medicine. If you are going to need a MRI, CT scan, or other procedure, tell your doctor that you are using this medicine (On-Body Injector only). What side effects may I notice from receiving this medicine? Side effects that you should report to your doctor or health care professional as soon as possible: -allergic reactions like skin rash, itching or hives, swelling of the face, lips, or tongue -dizziness -fever -pain, redness, or irritation at site where injected -pinpoint red spots on the skin -red or dark-brown urine -shortness of breath or breathing problems -stomach or side pain, or pain   at the shoulder -swelling -tiredness -trouble passing urine or change in the amount of urine Side effects that usually do not require medical attention (report to your doctor or health care professional if they continue or are  bothersome): -bone pain -muscle pain This list may not describe all possible side effects. Call your doctor for medical advice about side effects. You may report side effects to FDA at 1-800-FDA-1088. Where should I keep my medicine? Keep out of the reach of children. Store pre-filled syringes in a refrigerator between 2 and 8 degrees C (36 and 46 degrees F). Do not freeze. Keep in carton to protect from light. Throw away this medicine if it is left out of the refrigerator for more than 48 hours. Throw away any unused medicine after the expiration date. NOTE: This sheet is a summary. It may not cover all possible information. If you have questions about this medicine, talk to your doctor, pharmacist, or health care provider.    2016, [employer]vier/[employer] Standard. (2014-05-02 14:30:14)  

## 2016-02-02 ENCOUNTER — Other Ambulatory Visit (HOSPITAL_BASED_OUTPATIENT_CLINIC_OR_DEPARTMENT_OTHER): Payer: 59

## 2016-02-02 DIAGNOSIS — C3492 Malignant neoplasm of unspecified part of left bronchus or lung: Secondary | ICD-10-CM | POA: Diagnosis not present

## 2016-02-02 LAB — CBC WITH DIFFERENTIAL/PLATELET
BASO%: 0.3 % (ref 0.0–2.0)
Basophils Absolute: 0.1 10*3/uL (ref 0.0–0.1)
EOS%: 0 % (ref 0.0–7.0)
Eosinophils Absolute: 0 10*3/uL (ref 0.0–0.5)
HCT: 33.1 % — ABNORMAL LOW (ref 34.8–46.6)
HEMOGLOBIN: 11.3 g/dL — AB (ref 11.6–15.9)
LYMPH%: 7.5 % — ABNORMAL LOW (ref 14.0–49.7)
MCH: 32.4 pg (ref 25.1–34.0)
MCHC: 34.1 g/dL (ref 31.5–36.0)
MCV: 94.8 fL (ref 79.5–101.0)
MONO#: 0.6 10*3/uL (ref 0.1–0.9)
MONO%: 2.6 % (ref 0.0–14.0)
NEUT%: 89.6 % — ABNORMAL HIGH (ref 38.4–76.8)
NEUTROS ABS: 21.6 10*3/uL — AB (ref 1.5–6.5)
Platelets: 247 10*3/uL (ref 145–400)
RBC: 3.49 10*6/uL — ABNORMAL LOW (ref 3.70–5.45)
RDW: 16.8 % — AB (ref 11.2–14.5)
WBC: 24.2 10*3/uL — AB (ref 3.9–10.3)
lymph#: 1.8 10*3/uL (ref 0.9–3.3)

## 2016-02-02 LAB — COMPREHENSIVE METABOLIC PANEL
ALBUMIN: 3.3 g/dL — AB (ref 3.5–5.0)
ALK PHOS: 158 U/L — AB (ref 40–150)
ALT: 26 U/L (ref 0–55)
ANION GAP: 9 meq/L (ref 3–11)
AST: 16 U/L (ref 5–34)
BILIRUBIN TOTAL: 0.54 mg/dL (ref 0.20–1.20)
BUN: 16.4 mg/dL (ref 7.0–26.0)
CALCIUM: 9.6 mg/dL (ref 8.4–10.4)
CHLORIDE: 103 meq/L (ref 98–109)
CO2: 25 mEq/L (ref 22–29)
CREATININE: 0.7 mg/dL (ref 0.6–1.1)
EGFR: >90 mL/min/{1.73_m2} (ref >90)
Glucose: 94 mg/dl (ref 70–140)
Potassium: 4.4 mEq/L (ref 3.5–5.1)
Sodium: 137 mEq/L (ref 136–145)
TOTAL PROTEIN: 6.8 g/dL (ref 6.4–8.3)

## 2016-02-02 LAB — MAGNESIUM: MAGNESIUM: 1.8 mg/dL (ref 1.5–2.5)

## 2016-02-09 ENCOUNTER — Other Ambulatory Visit (HOSPITAL_BASED_OUTPATIENT_CLINIC_OR_DEPARTMENT_OTHER): Payer: 59

## 2016-02-09 ENCOUNTER — Telehealth: Payer: Self-pay | Admitting: Medical Oncology

## 2016-02-09 DIAGNOSIS — C3492 Malignant neoplasm of unspecified part of left bronchus or lung: Secondary | ICD-10-CM | POA: Diagnosis not present

## 2016-02-09 LAB — CBC WITH DIFFERENTIAL/PLATELET
BASO%: 0.7 % (ref 0.0–2.0)
Basophils Absolute: 0.1 10*3/uL (ref 0.0–0.1)
EOS%: 0.9 % (ref 0.0–7.0)
Eosinophils Absolute: 0.1 10*3/uL (ref 0.0–0.5)
HCT: 34.4 % — ABNORMAL LOW (ref 34.8–46.6)
HEMOGLOBIN: 11.7 g/dL (ref 11.6–15.9)
LYMPH#: 2.2 10*3/uL (ref 0.9–3.3)
LYMPH%: 27.6 % (ref 14.0–49.7)
MCH: 32.7 pg (ref 25.1–34.0)
MCHC: 34 g/dL (ref 31.5–36.0)
MCV: 96.1 fL (ref 79.5–101.0)
MONO#: 0.8 10*3/uL (ref 0.1–0.9)
MONO%: 10 % (ref 0.0–14.0)
NEUT#: 4.9 10*3/uL (ref 1.5–6.5)
NEUT%: 60.8 % (ref 38.4–76.8)
NRBC: 1 % — AB (ref 0–0)
Platelets: 205 10*3/uL (ref 145–400)
RBC: 3.58 10*6/uL — AB (ref 3.70–5.45)
RDW: 17.5 % — AB (ref 11.2–14.5)
WBC: 8.1 10*3/uL (ref 3.9–10.3)

## 2016-02-09 LAB — COMPREHENSIVE METABOLIC PANEL
ALBUMIN: 3.3 g/dL — AB (ref 3.5–5.0)
ALK PHOS: 135 U/L (ref 40–150)
ALT: 26 U/L (ref 0–55)
AST: 17 U/L (ref 5–34)
Anion Gap: 11 mEq/L (ref 3–11)
BUN: 12.2 mg/dL (ref 7.0–26.0)
CO2: 26 meq/L (ref 22–29)
Calcium: 9.6 mg/dL (ref 8.4–10.4)
Chloride: 104 mEq/L (ref 98–109)
Creatinine: 0.8 mg/dL (ref 0.6–1.1)
EGFR: 85 mL/min/{1.73_m2} — AB (ref >90)
GLUCOSE: 144 mg/dL — AB (ref 70–140)
POTASSIUM: 4.4 meq/L (ref 3.5–5.1)
SODIUM: 140 meq/L (ref 136–145)
TOTAL PROTEIN: 6.9 g/dL (ref 6.4–8.3)
Total Bilirubin: <0.22 mg/dL (ref 0.20–1.20)

## 2016-02-09 LAB — MAGNESIUM: MAGNESIUM: 2 mg/dL (ref 1.5–2.5)

## 2016-02-09 NOTE — Telephone Encounter (Signed)
Claim number [id number]  last  office visit notes.

## 2016-02-11 ENCOUNTER — Ambulatory Visit (INDEPENDENT_AMBULATORY_CARE_PROVIDER_SITE_OTHER): Payer: 59 | Admitting: Pulmonary Disease

## 2016-02-11 ENCOUNTER — Encounter: Payer: Self-pay | Admitting: Pulmonary Disease

## 2016-02-11 ENCOUNTER — Telehealth: Payer: Self-pay | Admitting: Medical Oncology

## 2016-02-11 VITALS — BP 138/66 | HR 88 | Ht 66.0 in | Wt 268.0 lb

## 2016-02-11 DIAGNOSIS — J449 Chronic obstructive pulmonary disease, unspecified: Secondary | ICD-10-CM

## 2016-02-11 DIAGNOSIS — C3490 Malignant neoplasm of unspecified part of unspecified bronchus or lung: Secondary | ICD-10-CM

## 2016-02-11 DIAGNOSIS — F17200 Nicotine dependence, unspecified, uncomplicated: Secondary | ICD-10-CM

## 2016-02-11 LAB — PULMONARY FUNCTION TEST
DL/VA % pred: 69 %
DL/VA: 3.48 ml/min/mmHg/L
DLCO UNC % PRED: 57 %
DLCO UNC: 15.5 ml/min/mmHg
DLCO cor % pred: 58 %
DLCO cor: 15.86 ml/min/mmHg
FEF 25-75 POST: 1.24 L/s
FEF 25-75 Pre: 0.87 L/sec
FEF2575-%CHANGE-POST: 43 %
FEF2575-%PRED-POST: 49 %
FEF2575-%Pred-Pre: 34 %
FEV1-%CHANGE-POST: 8 %
FEV1-%PRED-POST: 55 %
FEV1-%PRED-PRE: 51 %
FEV1-POST: 1.55 L
FEV1-PRE: 1.42 L
FEV1FVC-%Change-Post: 4 %
FEV1FVC-%PRED-PRE: 88 %
FEV6-%Change-Post: 5 %
FEV6-%Pred-Post: 62 %
FEV6-%Pred-Pre: 59 %
FEV6-PRE: 2.04 L
FEV6-Post: 2.16 L
FEV6FVC-%Change-Post: 1 %
FEV6FVC-%PRED-PRE: 101 %
FEV6FVC-%Pred-Post: 103 %
FVC-%CHANGE-POST: 4 %
FVC-%PRED-PRE: 57 %
FVC-%Pred-Post: 60 %
FVC-POST: 2.16 L
FVC-PRE: 2.07 L
POST FEV1/FVC RATIO: 72 %
PRE FEV6/FVC RATIO: 99 %
Post FEV6/FVC ratio: 100 %
Pre FEV1/FVC ratio: 69 %

## 2016-02-11 NOTE — Telephone Encounter (Signed)
-----   Message from Curt Bears, MD sent at 02/09/2016  5:31 PM EDT ----- Regarding: RE: estimated return to work date Few more months, at least 3 if not more. ----- Message ----- From: Ardeen Garland, RN Sent: 02/09/2016  12:53 PM To: Curt Bears, MD Subject: estimated return to work date                  Do you know when she can return to work ?

## 2016-02-11 NOTE — Progress Notes (Signed)
Test reviewed.  

## 2016-02-11 NOTE — Patient Instructions (Signed)
   Keep using your inhalers as prescribed.  Call me if you have any new breathing problems before your next appointment.  I will see you back in 6 months or sooner if needed.

## 2016-02-11 NOTE — Telephone Encounter (Signed)
Claim number [id number] to work note called  To Vinnie Level

## 2016-02-11 NOTE — Progress Notes (Signed)
Subjective:    Patient ID: Sharon [name], female    DOB: [birth year], 60 y.o.   MRN: [id number]  C.C.:  Follow-up for Severe COPD, Small Cell Lung Cancer, & Tobacco Use Disorder.  HPI Severe COPD: On Symbicort. She reports her breathing is ok. She is rarely using her rescue inhaler. She reports recently she has had some mild cough. No significant wheezing. No exacerbations since last appointment.   Small Cell Lung Cancer: Followed by Dr. Julien Nordmann. She is tolerating Chemotherapy with some mild nausea and fatigue.   Tobacco Use Disorder:  She reports she has nearly cut out smoking totally. She is only rarely smoking.  Review of Systems No fever, chills, or sweats. No chest pain or pressure. Rare headache. No syncope. Infrequent nose bleeds.   No Known Allergies  Current Outpatient Prescriptions on File Prior to Visit  Medication Sig Dispense Refill  . albuterol (PROVENTIL HFA;VENTOLIN HFA) 108 (90 Base) MCG/ACT inhaler Inhale 1 puff into the lungs every 6 (six) hours as needed for wheezing or shortness of breath.    . budesonide-formoterol (SYMBICORT) 160-4.5 MCG/ACT inhaler Inhale 2 puffs into the lungs 2 (two) times daily.    . fluticasone (FLONASE) 50 MCG/ACT nasal spray Place 2 sprays into both nostrils daily as needed.     . montelukast (SINGULAIR) 10 MG tablet Take 10 mg by mouth daily as needed.     . prochlorperazine (COMPAZINE) 10 MG tablet Take 1 tablet (10 mg total) by mouth every 6 (six) hours as needed for nausea or vomiting. 60 tablet 1   No current facility-administered medications on file prior to visit.     Past Medical History:  Diagnosis Date  . Cancer of right breast ([place]) 2009  . CAP (community acquired pneumonia) 11/06/2015  . COPD (chronic obstructive pulmonary disease) ([place])   . Elevated blood pressure   . Encounter for antineoplastic chemotherapy 01/05/2016  . Hoarseness of voice    "for the last 2 months" (11/06/2015)  . Lung mass dx'd 10/2015  . Menopause     . Pre-diabetes   . Small cell lung cancer ([place])   . Vitamin D deficiency     Past Surgical History:  Procedure Laterality Date  . BREAST BIOPSY Right 2009  . BREAST LUMPECTOMY Right 2009  . UTERINE FIBROID SURGERY  2000  . VAGINAL HYSTERECTOMY  2000  . VESICOVAGINAL FISTULA CLOSURE W/ TAH  2000  . VIDEO BRONCHOSCOPY WITH ENDOBRONCHIAL ULTRASOUND N/A 11/10/2015   Procedure: VIDEO BRONCHOSCOPY WITH ENDOBRONCHIAL ULTRASOUND;  Surgeon: Javier Glazier, MD;  Location: [facility];  Service: Thoracic;  Laterality: N/A;    Family History  Problem Relation Age of Onset  . Adopted: Yes    Social History   Social History  . Marital status: Divorced    Spouse name: N/A  . Number of children: 3  . 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 Smoking status: Former Smoker    Packs/day: 0.10    Years: 46.00    Types: Cigarettes    Quit date: 11/11/2015  . Smokeless tobacco: Never Used     Comment: Peak rate of 2ppd  . Alcohol use No  . Drug use:     Types: Marijuana     Comment: 11/06/2015 "1-2 times/week when I do smoke; none lately"  . Sexual activity: Yes   Other Topics Concern  . None   Social History Narrative  .  None      Objective:   Physical Exam BP 138/66 (BP Location: Left Arm, Cuff Size: Normal)   Pulse 88   Ht '5\' 6"'$  (1.676 m)   Wt 268 lb (121.6 kg)   SpO2 97%   BMI 43.26 kg/m  General:  Awake. No distress. Alone today. Integument:  Warm & dry. No rash on exposed skin.  HEENT:  Moist mucus membranes. No oral ulcers. No scleral icterus.  Cardiovascular:  Regular rate and rhythm. No edema. Normal S1 & S2. Pulmonary:  No accessory muscle use on room air. Speaking in complete sentences. Clear on auscultation. Abdomen: Soft. Normal bowel sounds. Protuberant.  PFT 02/11/16: FVC 2.07 L (87%) FEV1 1.42 L (81%) FEV1/FVC 0.69 FEF 25-75 0.87 L (34%) negative bronchodilator response                                                                   DLCO corrected 58% (hgb 12.7) 11/17/15: FVC 2.04 L (56%) FEV1 1.31 L (47%) FEV1/FVC 0.64 FEF 25-75 0.74 L (29%) negative bronchodilator response TLC 4.94 L (92%) RV 134% ERV 13% DLCO corrected 63% (Hgb 13.1)  IMAGING MRI BRAIN W/ & W/O 11/19/15 (per radiologist): Mild subcortical and periventricular T2 & FLAIR hyperintensities likely chronic microvascular ischemic changes. 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 No intracranial metastatic disease is observed.  PET CT 11/19/15 (per radiologist): Hypermetabolic left upper lobe mass, bilateral mediastinal, subcarinal, & bilateral supraclavicular adenopathy. 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 Hypermetabolism within body of left adrenal gland without definite CT correlate. Hepatic steatosis noted. Right adrenal adenoma noted.  PATHOLOGY EBUS 11/10/15:  Level 4L Small Cell Carcinoma / Level 7 Malignant Cells Present  LABS 11/20/15 Alpha-1 antitrypsin: 209    Assessment & Plan:  60 y.o. female with newly diagnosed small cell lung cancer and severe COPD. Patient's spirometry has improved since previous testing. Symptomatically she seems to be well-controlled on Symbicort. She is tolerating chemotherapy well for treatment of her underlying small cell lung cancer. I encouraged her to continue to abstain from smoking tobacco. I instructed her to contact my office if she had any new breathing problems or questions before her next appointment.  1. Severe COPD: Continuing Symbicort. No changes at this time. 2. Small Cell Lung Cancer:  Undergoing chemo by Dr. Julien Nordmann. Tolerating well. 3. Tobacco Use Disorder:  Patient has nearly completely cut out use.  4. Health Maintenance: S/P Pneumovax 16 November 2015 & Influenza October 2017. 5. Follow-up: Return to clinic in 6 months or sooner if needed.   Sonia Baller Ashok Cordia, M.D. [facility] Pager:  [phone] After 3pm or if no response, call [phone] 2:57 PM 02/11/16

## 2016-02-16 ENCOUNTER — Encounter: Payer: Self-pay | Admitting: Internal Medicine

## 2016-02-16 ENCOUNTER — Ambulatory Visit (HOSPITAL_BASED_OUTPATIENT_CLINIC_OR_DEPARTMENT_OTHER): Payer: 59 | Admitting: Internal Medicine

## 2016-02-16 ENCOUNTER — Ambulatory Visit (HOSPITAL_BASED_OUTPATIENT_CLINIC_OR_DEPARTMENT_OTHER): Payer: 59

## 2016-02-16 ENCOUNTER — Other Ambulatory Visit (HOSPITAL_BASED_OUTPATIENT_CLINIC_OR_DEPARTMENT_OTHER): Payer: 59

## 2016-02-16 VITALS — BP 125/94 | HR 95 | Temp 97.9°F | Resp 18 | Ht 66.0 in | Wt 268.9 lb

## 2016-02-16 DIAGNOSIS — C3412 Malignant neoplasm of upper lobe, left bronchus or lung: Secondary | ICD-10-CM

## 2016-02-16 DIAGNOSIS — Z5111 Encounter for antineoplastic chemotherapy: Secondary | ICD-10-CM

## 2016-02-16 DIAGNOSIS — C3492 Malignant neoplasm of unspecified part of left bronchus or lung: Secondary | ICD-10-CM

## 2016-02-16 DIAGNOSIS — J910 Malignant pleural effusion: Secondary | ICD-10-CM | POA: Diagnosis not present

## 2016-02-16 LAB — CBC WITH DIFFERENTIAL/PLATELET
BASO%: 0.8 % (ref 0.0–2.0)
BASOS ABS: 0.1 10*3/uL (ref 0.0–0.1)
EOS%: 1.2 % (ref 0.0–7.0)
Eosinophils Absolute: 0.1 10*3/uL (ref 0.0–0.5)
HCT: 33.7 % — ABNORMAL LOW (ref 34.8–46.6)
HGB: 11.4 g/dL — ABNORMAL LOW (ref 11.6–15.9)
LYMPH%: 16.7 % (ref 14.0–49.7)
MCH: 33.3 pg (ref 25.1–34.0)
MCHC: 33.9 g/dL (ref 31.5–36.0)
MCV: 98.1 fL (ref 79.5–101.0)
MONO#: 1 10*3/uL — ABNORMAL HIGH (ref 0.1–0.9)
MONO%: 10.8 % (ref 0.0–14.0)
NEUT#: 6.4 10*3/uL (ref 1.5–6.5)
NEUT%: 70.5 % (ref 38.4–76.8)
PLATELETS: 332 10*3/uL (ref 145–400)
RBC: 3.43 10*6/uL — AB (ref 3.70–5.45)
RDW: 19.4 % — ABNORMAL HIGH (ref 11.2–14.5)
WBC: 9 10*3/uL (ref 3.9–10.3)
lymph#: 1.5 10*3/uL (ref 0.9–3.3)

## 2016-02-16 LAB — COMPREHENSIVE METABOLIC PANEL
ALK PHOS: 102 U/L (ref 40–150)
ALT: 29 U/L (ref 0–55)
ANION GAP: 10 meq/L (ref 3–11)
AST: 22 U/L (ref 5–34)
Albumin: 3.3 g/dL — ABNORMAL LOW (ref 3.5–5.0)
BUN: 15.5 mg/dL (ref 7.0–26.0)
CO2: 22 meq/L (ref 22–29)
Calcium: 9.3 mg/dL (ref 8.4–10.4)
Chloride: 106 mEq/L (ref 98–109)
Creatinine: 0.7 mg/dL (ref 0.6–1.1)
GLUCOSE: 105 mg/dL (ref 70–140)
POTASSIUM: 4.2 meq/L (ref 3.5–5.1)
SODIUM: 139 meq/L (ref 136–145)
Total Bilirubin: <0.22 mg/dL (ref 0.20–1.20)
Total Protein: 6.9 g/dL (ref 6.4–8.3)

## 2016-02-16 LAB — MAGNESIUM: Magnesium: 2 mg/dl (ref 1.5–2.5)

## 2016-02-16 MED ORDER — POTASSIUM CHLORIDE 2 MEQ/ML IV SOLN
Freq: Once | INTRAVENOUS | Status: AC
  Administered 2016-02-16: 10:00:00 via INTRAVENOUS
  Filled 2016-02-16: qty 10

## 2016-02-16 MED ORDER — FOSAPREPITANT DIMEGLUMINE INJECTION 150 MG
Freq: Once | INTRAVENOUS | Status: AC
  Administered 2016-02-16: 12:00:00 via INTRAVENOUS
  Filled 2016-02-16: qty 5

## 2016-02-16 MED ORDER — SODIUM CHLORIDE 0.9 % IV SOLN: 120.0000 mg/m2 | Freq: Once | INTRAVENOUS | Status: DC

## 2016-02-16 MED ORDER — PALONOSETRON HCL INJECTION 0.25 MG/5ML
0.2500 mg | Freq: Once | INTRAVENOUS | Status: AC
  Administered 2016-02-16: 0.25 mg via INTRAVENOUS

## 2016-02-16 MED ORDER — PALONOSETRON HCL INJECTION 0.25 MG/5ML
INTRAVENOUS | Status: AC
  Filled 2016-02-16: qty 5

## 2016-02-16 MED ORDER — SODIUM CHLORIDE 0.9 % IV SOLN
60.0000 mg/m2 | Freq: Once | INTRAVENOUS | Status: AC
  Administered 2016-02-16: 140 mg via INTRAVENOUS
  Filled 2016-02-16: qty 140

## 2016-02-16 MED ORDER — ETOPOSIDE CHEMO INJECTION 1 GM/50ML
120.0000 mg/m2 | Freq: Once | INTRAVENOUS | Status: AC
  Administered 2016-02-16: 280 mg via INTRAVENOUS
  Filled 2016-02-16: qty 14

## 2016-02-16 MED ORDER — SODIUM CHLORIDE 0.9 % IV SOLN
Freq: Once | INTRAVENOUS | Status: AC
  Administered 2016-02-16: 10:00:00 via INTRAVENOUS

## 2016-02-16 NOTE — Progress Notes (Signed)
Pt voided  600 cc urine pre CDDP;  Voided  1775 cc post CDDP.

## 2016-02-16 NOTE — Patient Instructions (Signed)
[facility] Discharge Instructions for Patients Receiving Chemotherapy  Today you received the following chemotherapy agents :  Cisplatin,  Etoposide.  To help prevent nausea and vomiting after your treatment, we encourage you to take your nausea medication as prescribed.   If you develop nausea and vomiting that is not controlled by your nausea medication, call the clinic.   BELOW ARE SYMPTOMS THAT SHOULD BE REPORTED IMMEDIATELY:  *FEVER GREATER THAN 100.5 F  *CHILLS WITH OR WITHOUT FEVER  NAUSEA AND VOMITING THAT IS NOT CONTROLLED WITH YOUR NAUSEA MEDICATION  *UNUSUAL SHORTNESS OF BREATH  *UNUSUAL BRUISING OR BLEEDING  TENDERNESS IN MOUTH AND THROAT WITH OR WITHOUT PRESENCE OF ULCERS  *URINARY PROBLEMS  *BOWEL PROBLEMS  UNUSUAL RASH Items with * indicate a potential emergency and should be followed up as soon as possible.  Feel free to call the clinic you have any questions or concerns. The clinic phone number is [phone].  Please show the [place] at check-in to the Emergency Department and triage nurse.

## 2016-02-16 NOTE — Progress Notes (Signed)
[facility] [phone]   [phone]  OFFICE PROGRESS NOTE  [name], [place] [address]  DIAGNOSIS: Extensive stage (T2b, N2, M1a) small cell lung cancer, diagnosed in July 2017 and presented with large left upper lobe lung mass and mediastinal lymphadenopathy and malignant left pleural effusion.  PRIOR THERAPY: None.  CURRENT THERAPY: Systemic chemotherapy with cisplatin 60 MG/M2 on day 1 and etoposide 120 MG/M2 on days 1, 2 and 3. Status post 4 cycles.  INTERVAL HISTORY: Sharon [name] 60 y.o. female returns to the clinic today for follow-up visit. The patient is feeling fine today with no specific complaints except for mild nausea after her treatment, resolved with Compazine. She is also have intermittent jaw pain. She is tolerating her treatment fairly well. She status post 4 cycles. She denied having any significant fever or chills. She has no nausea or vomiting. She denied having any significant chest pain, shortness breath, cough or hemoptysis. She has no significant weight loss or night sweats. She is here today for evaluation before starting cycle #5.  MEDICAL HISTORY: Past Medical History:  Diagnosis Date  . Cancer of right breast ([place]) 2009  . CAP (community acquired pneumonia) 11/06/2015  . COPD (chronic obstructive pulmonary disease) ([place])   . Elevated blood pressure   . Encounter for antineoplastic chemotherapy 01/05/2016  . Hoarseness of voice    "for the last 2 months" (11/06/2015)  . Lung mass dx'd 10/2015  . Menopause   . Pre-diabetes   . Small cell lung cancer ([place])   . Vitamin D deficiency     ALLERGIES:  has No Known Allergies.  MEDICATIONS:  Current Outpatient Prescriptions  Medication Sig Dispense Refill  . albuterol (PROVENTIL HFA;VENTOLIN HFA) 108 (90 Base) MCG/ACT inhaler Inhale 1 puff into the lungs every 6 (six) hours as needed for wheezing or shortness of breath.    .  budesonide-formoterol (SYMBICORT) 160-4.5 MCG/ACT inhaler Inhale 2 puffs into the lungs 2 (two) times daily.    . fluticasone (FLONASE) 50 MCG/ACT nasal spray Place 2 sprays into both nostrils daily as needed.     . montelukast (SINGULAIR) 10 MG tablet Take 10 mg by mouth daily as needed.     . prochlorperazine (COMPAZINE) 10 MG tablet Take 1 tablet (10 mg total) by mouth every 6 (six) hours as needed for nausea or vomiting. 60 tablet 1   No current facility-administered medications for this visit.     SURGICAL HISTORY:  Past Surgical History:  Procedure Laterality Date  . BREAST BIOPSY Right 2009  . BREAST LUMPECTOMY Right 2009  . UTERINE FIBROID SURGERY  2000  . VAGINAL HYSTERECTOMY  2000  . VESICOVAGINAL FISTULA CLOSURE W/ TAH  2000  . VIDEO BRONCHOSCOPY WITH ENDOBRONCHIAL ULTRASOUND N/A 11/10/2015   Procedure: VIDEO BRONCHOSCOPY WITH ENDOBRONCHIAL ULTRASOUND;  Surgeon: Javier Glazier, MD;  Location: [facility];  Service: Thoracic;  Laterality: N/A;    REVIEW OF SYSTEMS:  A comprehensive review of systems was negative except for: Ears, nose, mouth, throat, and face: positive for Intermittent Jaw pain Gastrointestinal: positive for nausea   PHYSICAL EXAMINATION: General appearance: alert, cooperative and no distress Head: Normocephalic, without obvious abnormality, atraumatic Neck: no adenopathy, no JVD, supple, symmetrical, trachea midline and thyroid not enlarged, symmetric, no tenderness/mass/nodules Lymph nodes: Cervical, supraclavicular, and axillary nodes normal. Resp: clear to auscultation bilaterally Back: symmetric, no curvature. ROM normal. No CVA tenderness. Cardio: regular rate and rhythm, S1, S2  normal, no murmur, click, rub or gallop GI: soft, non-tender; bowel sounds normal; no masses,  no organomegaly Extremities: extremities normal, atraumatic, no cyanosis or edema  ECOG PERFORMANCE STATUS: 1 - Symptomatic but completely ambulatory  There were no vitals taken for  this visit.  LABORATORY DATA: Lab Results  Component Value Date   WBC 9.0 02/16/2016   HGB 11.4 (L) 02/16/2016   HCT 33.7 (L) 02/16/2016   MCV 98.1 02/16/2016   PLT 332 02/16/2016      Chemistry      Component Value Date/Time   NA 140 02/09/2016 1158   K 4.4 02/09/2016 1158   CL 102 11/07/2015 0140   CL 104 07/18/2012 1451   CO2 26 02/09/2016 1158   BUN 12.2 02/09/2016 1158   CREATININE 0.8 02/09/2016 1158      Component Value Date/Time   CALCIUM 9.6 02/09/2016 1158   ALKPHOS 135 02/09/2016 1158   AST 17 02/09/2016 1158   ALT 26 02/09/2016 1158   BILITOT <0.22 02/09/2016 1158       RADIOGRAPHIC STUDIES: No results found.  ASSESSMENT AND PLAN: This is a very pleasant 60 years old white female recently diagnosed with small cell lung cancer likely extensive stage disease with bilateral cervical lymphadenopathy and questionable adrenal lesion.  The patient is currently undergoing systemic chemotherapy with cisplatin and etoposide status post 4 cycles. The patient is tolerating her treatment well with no significant adverse effects. I recommended for her to proceed with cycle #5 today as a scheduled. The patient would come back for follow-up visit in 3 weeks for evaluation before starting cycle #6. The patient was advised to call immediately if she has any concerning symptoms in the interval.  The patient voices understanding of current disease status and treatment options and is in agreement with the current care plan.  All questions were answered. The patient knows to call the clinic with any problems, questions or concerns. We can certainly see the patient much sooner if necessary.   Disclaimer: This note was dictated with voice recognition software. Similar sounding words can inadvertently be transcribed and may not be corrected upon review.

## 2016-02-17 ENCOUNTER — Encounter: Payer: Self-pay | Admitting: Pharmacist

## 2016-02-17 ENCOUNTER — Ambulatory Visit (HOSPITAL_BASED_OUTPATIENT_CLINIC_OR_DEPARTMENT_OTHER): Payer: 59

## 2016-02-17 VITALS — BP 114/52 | HR 68 | Temp 97.7°F | Resp 18

## 2016-02-17 DIAGNOSIS — Z5111 Encounter for antineoplastic chemotherapy: Secondary | ICD-10-CM | POA: Diagnosis not present

## 2016-02-17 DIAGNOSIS — C3492 Malignant neoplasm of unspecified part of left bronchus or lung: Secondary | ICD-10-CM

## 2016-02-17 MED ORDER — SODIUM CHLORIDE 0.9 % IV SOLN
120.0000 mg/m2 | Freq: Once | INTRAVENOUS | Status: AC
  Administered 2016-02-17: 280 mg via INTRAVENOUS
  Filled 2016-02-17: qty 14

## 2016-02-17 MED ORDER — SODIUM CHLORIDE 0.9 % IV SOLN
Freq: Once | INTRAVENOUS | Status: AC
  Administered 2016-02-17: 16:00:00 via INTRAVENOUS

## 2016-02-17 MED ORDER — DEXAMETHASONE SODIUM PHOSPHATE 10 MG/ML IJ SOLN
10.0000 mg | Freq: Once | INTRAMUSCULAR | Status: AC
  Administered 2016-02-17: 10 mg via INTRAVENOUS

## 2016-02-17 MED ORDER — DEXAMETHASONE SODIUM PHOSPHATE 10 MG/ML IJ SOLN
INTRAMUSCULAR | Status: AC
  Filled 2016-02-17: qty 1

## 2016-02-17 MED ORDER — DEXAMETHASONE SODIUM PHOSPHATE 100 MG/10ML IJ SOLN: 10.0000 mg | Freq: Once | INTRAMUSCULAR | Status: DC

## 2016-02-17 MED ORDER — SODIUM CHLORIDE 0.9% FLUSH
10.0000 mL | INTRAVENOUS | Status: DC | PRN
  Filled 2016-02-17: qty 10

## 2016-02-17 NOTE — Patient Instructions (Signed)
[facility] Discharge Instructions for Patients Receiving Chemotherapy  Today you received the following chemotherapy agents: Etoposide   To help prevent nausea and vomiting after your treatment, we encourage you to take your nausea medication as directed.    If you develop nausea and vomiting that is not controlled by your nausea medication, call the clinic.   BELOW ARE SYMPTOMS THAT SHOULD BE REPORTED IMMEDIATELY:  *FEVER GREATER THAN 100.5 F  *CHILLS WITH OR WITHOUT FEVER  NAUSEA AND VOMITING THAT IS NOT CONTROLLED WITH YOUR NAUSEA MEDICATION  *UNUSUAL SHORTNESS OF BREATH  *UNUSUAL BRUISING OR BLEEDING  TENDERNESS IN MOUTH AND THROAT WITH OR WITHOUT PRESENCE OF ULCERS  *URINARY PROBLEMS  *BOWEL PROBLEMS  UNUSUAL RASH Items with * indicate a potential emergency and should be followed up as soon as possible.  Feel free to call the clinic you have any questions or concerns. The clinic phone number is [phone].  Please show the CHEMO ALERT CARD at check-in to the Emergency Department and triage nurse.   

## 2016-02-18 ENCOUNTER — Ambulatory Visit (HOSPITAL_BASED_OUTPATIENT_CLINIC_OR_DEPARTMENT_OTHER): Payer: 59

## 2016-02-18 VITALS — BP 128/65 | HR 59 | Temp 98.2°F | Resp 16

## 2016-02-18 DIAGNOSIS — Z5111 Encounter for antineoplastic chemotherapy: Secondary | ICD-10-CM | POA: Diagnosis not present

## 2016-02-18 DIAGNOSIS — C3492 Malignant neoplasm of unspecified part of left bronchus or lung: Secondary | ICD-10-CM | POA: Diagnosis not present

## 2016-02-18 MED ORDER — SODIUM CHLORIDE 0.9 % IV SOLN
Freq: Once | INTRAVENOUS | Status: AC
  Administered 2016-02-18: 14:00:00 via INTRAVENOUS

## 2016-02-18 MED ORDER — ETOPOSIDE CHEMO INJECTION 1 GM/50ML
120.0000 mg/m2 | Freq: Once | INTRAVENOUS | Status: AC
  Administered 2016-02-18: 280 mg via INTRAVENOUS
  Filled 2016-02-18: qty 14

## 2016-02-18 MED ORDER — DEXAMETHASONE SODIUM PHOSPHATE 10 MG/ML IJ SOLN
10.0000 mg | Freq: Once | INTRAMUSCULAR | Status: AC
  Administered 2016-02-18: 10 mg via INTRAVENOUS

## 2016-02-18 MED ORDER — DEXAMETHASONE SODIUM PHOSPHATE 10 MG/ML IJ SOLN
INTRAMUSCULAR | Status: AC
  Filled 2016-02-18: qty 1

## 2016-02-18 NOTE — Patient Instructions (Signed)
[facility] Discharge Instructions for Patients Receiving Chemotherapy  Today you received the following chemotherapy agents: Etoposide   To help prevent nausea and vomiting after your treatment, we encourage you to take your nausea medication as directed.    If you develop nausea and vomiting that is not controlled by your nausea medication, call the clinic.   BELOW ARE SYMPTOMS THAT SHOULD BE REPORTED IMMEDIATELY:  *FEVER GREATER THAN 100.5 F  *CHILLS WITH OR WITHOUT FEVER  NAUSEA AND VOMITING THAT IS NOT CONTROLLED WITH YOUR NAUSEA MEDICATION  *UNUSUAL SHORTNESS OF BREATH  *UNUSUAL BRUISING OR BLEEDING  TENDERNESS IN MOUTH AND THROAT WITH OR WITHOUT PRESENCE OF ULCERS  *URINARY PROBLEMS  *BOWEL PROBLEMS  UNUSUAL RASH Items with * indicate a potential emergency and should be followed up as soon as possible.  Feel free to call the clinic you have any questions or concerns. The clinic phone number is [phone].  Please show the CHEMO ALERT CARD at check-in to the Emergency Department and triage nurse.   

## 2016-02-20 ENCOUNTER — Telehealth: Payer: Self-pay | Admitting: Internal Medicine

## 2016-02-20 ENCOUNTER — Telehealth: Payer: Self-pay | Admitting: *Deleted

## 2016-02-20 ENCOUNTER — Ambulatory Visit (HOSPITAL_BASED_OUTPATIENT_CLINIC_OR_DEPARTMENT_OTHER): Payer: 59

## 2016-02-20 VITALS — BP 105/79 | HR 91 | Temp 97.6°F | Resp 18

## 2016-02-20 DIAGNOSIS — Z5189 Encounter for other specified aftercare: Secondary | ICD-10-CM | POA: Diagnosis not present

## 2016-02-20 DIAGNOSIS — C3492 Malignant neoplasm of unspecified part of left bronchus or lung: Secondary | ICD-10-CM | POA: Diagnosis not present

## 2016-02-20 MED ORDER — PEGFILGRASTIM INJECTION 6 MG/0.6ML ~~LOC~~
6.0000 mg | PREFILLED_SYRINGE | Freq: Once | SUBCUTANEOUS | Status: AC
  Administered 2016-02-20: 6 mg via SUBCUTANEOUS
  Filled 2016-02-20: qty 0.6

## 2016-02-20 NOTE — Telephone Encounter (Signed)
Per LOS the scheduled appts and notified the scheduler

## 2016-02-20 NOTE — Patient Instructions (Signed)
Pegfilgrastim injection What is this medicine? PEGFILGRASTIM (PEG fil gra stim) is a long-acting granulocyte colony-stimulating factor that stimulates the growth of neutrophils, a type of white blood cell important in the body's fight against infection. It is used to reduce the incidence of fever and infection in patients with certain types of cancer who are receiving chemotherapy that affects the bone marrow, and to increase survival after being exposed to high doses of radiation. This medicine may be used for other purposes; ask your health care provider or pharmacist if you have questions. What should I tell my health care provider before I take this medicine? They need to know if you have any of these conditions: -kidney disease -latex allergy -ongoing radiation therapy -sickle cell disease -skin reactions to acrylic adhesives (On-Body Injector only) -an unusual or allergic reaction to pegfilgrastim, filgrastim, other medicines, foods, dyes, or preservatives -pregnant or trying to get pregnant -breast-feeding How should I use this medicine? This medicine is for injection under the skin. If you get this medicine at home, you will be taught how to prepare and give the pre-filled syringe or how to use the On-body Injector. Refer to the patient Instructions for Use for detailed instructions. Use exactly as directed. Take your medicine at regular intervals. Do not take your medicine more often than directed. It is important that you put your used needles and syringes in a special sharps container. Do not put them in a trash can. If you do not have a sharps container, call your pharmacist or healthcare provider to get one. Talk to your pediatrician regarding the use of this medicine in children. While this drug may be prescribed for selected conditions, precautions do apply. Overdosage: If you think you have taken too much of this medicine contact a poison control center or emergency room at  once. NOTE: This medicine is only for you. Do not share this medicine with others. What if I miss a dose? It is important not to miss your dose. Call your doctor or health care professional if you miss your dose. If you miss a dose due to an On-body Injector failure or leakage, a new dose should be administered as soon as possible using a single prefilled syringe for manual use. What may interact with this medicine? Interactions have not been studied. Give your health care provider a list of all the medicines, herbs, non-prescription drugs, or dietary supplements you use. Also tell them if you smoke, drink alcohol, or use illegal drugs. Some items may interact with your medicine. This list may not describe all possible interactions. Give your health care provider a list of all the medicines, herbs, non-prescription drugs, or dietary supplements you use. Also tell them if you smoke, drink alcohol, or use illegal drugs. Some items may interact with your medicine. What should I watch for while using this medicine? You may need blood work done while you are taking this medicine. If you are going to need a MRI, CT scan, or other procedure, tell your doctor that you are using this medicine (On-Body Injector only). What side effects may I notice from receiving this medicine? Side effects that you should report to your doctor or health care professional as soon as possible: -allergic reactions like skin rash, itching or hives, swelling of the face, lips, or tongue -dizziness -fever -pain, redness, or irritation at site where injected -pinpoint red spots on the skin -red or dark-brown urine -shortness of breath or breathing problems -stomach or side pain, or pain   at the shoulder -swelling -tiredness -trouble passing urine or change in the amount of urine Side effects that usually do not require medical attention (report to your doctor or health care professional if they continue or are  bothersome): -bone pain -muscle pain This list may not describe all possible side effects. Call your doctor for medical advice about side effects. You may report side effects to FDA at 1-800-FDA-1088. Where should I keep my medicine? Keep out of the reach of children. Store pre-filled syringes in a refrigerator between 2 and 8 degrees C (36 and 46 degrees F). Do not freeze. Keep in carton to protect from light. Throw away this medicine if it is left out of the refrigerator for more than 48 hours. Throw away any unused medicine after the expiration date. NOTE: This sheet is a summary. It may not cover all possible information. If you have questions about this medicine, talk to your doctor, pharmacist, or health care provider.    2016, [employer]vier/[employer] Standard. (2014-05-02 14:30:14)  

## 2016-02-20 NOTE — Telephone Encounter (Signed)
Appointments scheduled per 02/20/16 los. AVS report and appointment schedule given to patient, per 02/20/16 los.

## 2016-02-20 NOTE — Telephone Encounter (Signed)
Message sent to chemo scheduler to add chemo, per 02/20/16 los.

## 2016-02-23 ENCOUNTER — Other Ambulatory Visit (HOSPITAL_BASED_OUTPATIENT_CLINIC_OR_DEPARTMENT_OTHER): Payer: 59

## 2016-02-23 DIAGNOSIS — C3492 Malignant neoplasm of unspecified part of left bronchus or lung: Secondary | ICD-10-CM | POA: Diagnosis not present

## 2016-02-23 LAB — CBC WITH DIFFERENTIAL/PLATELET
BASO%: 0.3 % (ref 0.0–2.0)
BASOS ABS: 0.1 10*3/uL (ref 0.0–0.1)
EOS ABS: 0 10*3/uL (ref 0.0–0.5)
EOS%: 0.1 % (ref 0.0–7.0)
HEMATOCRIT: 31.3 % — AB (ref 34.8–46.6)
HEMOGLOBIN: 10.7 g/dL — AB (ref 11.6–15.9)
LYMPH%: 8.4 % — ABNORMAL LOW (ref 14.0–49.7)
MCH: 33.3 pg (ref 25.1–34.0)
MCHC: 34.2 g/dL (ref 31.5–36.0)
MCV: 97.5 fL (ref 79.5–101.0)
MONO#: 0.4 10*3/uL (ref 0.1–0.9)
MONO%: 2.3 % (ref 0.0–14.0)
NEUT%: 88.9 % — ABNORMAL HIGH (ref 38.4–76.8)
NEUTROS ABS: 17 10*3/uL — AB (ref 1.5–6.5)
PLATELETS: 217 10*3/uL (ref 145–400)
RBC: 3.21 10*6/uL — ABNORMAL LOW (ref 3.70–5.45)
RDW: 17.1 % — AB (ref 11.2–14.5)
WBC: 19.1 10*3/uL — AB (ref 3.9–10.3)
lymph#: 1.6 10*3/uL (ref 0.9–3.3)

## 2016-02-23 LAB — COMPREHENSIVE METABOLIC PANEL
ALBUMIN: 3.2 g/dL — AB (ref 3.5–5.0)
ALK PHOS: 149 U/L (ref 40–150)
ALT: 33 U/L (ref 0–55)
AST: 18 U/L (ref 5–34)
Anion Gap: 9 mEq/L (ref 3–11)
BUN: 19.1 mg/dL (ref 7.0–26.0)
CHLORIDE: 105 meq/L (ref 98–109)
CO2: 23 mEq/L (ref 22–29)
Calcium: 9.3 mg/dL (ref 8.4–10.4)
Creatinine: 0.7 mg/dL (ref 0.6–1.1)
GLUCOSE: 104 mg/dL (ref 70–140)
POTASSIUM: 4.3 meq/L (ref 3.5–5.1)
SODIUM: 137 meq/L (ref 136–145)
Total Bilirubin: 0.47 mg/dL (ref 0.20–1.20)
Total Protein: 6.5 g/dL (ref 6.4–8.3)

## 2016-02-23 LAB — MAGNESIUM: MAGNESIUM: 1.7 mg/dL (ref 1.5–2.5)

## 2016-03-01 ENCOUNTER — Other Ambulatory Visit (HOSPITAL_BASED_OUTPATIENT_CLINIC_OR_DEPARTMENT_OTHER): Payer: 59

## 2016-03-01 DIAGNOSIS — C3492 Malignant neoplasm of unspecified part of left bronchus or lung: Secondary | ICD-10-CM

## 2016-03-01 LAB — CBC WITH DIFFERENTIAL/PLATELET
BASO%: 0.2 % (ref 0.0–2.0)
Basophils Absolute: 0 10*3/uL (ref 0.0–0.1)
EOS%: 0.9 % (ref 0.0–7.0)
Eosinophils Absolute: 0.1 10*3/uL (ref 0.0–0.5)
HCT: 32.2 % — ABNORMAL LOW (ref 34.8–46.6)
HEMOGLOBIN: 10.8 g/dL — AB (ref 11.6–15.9)
LYMPH#: 1.7 10*3/uL (ref 0.9–3.3)
LYMPH%: 30.7 % (ref 14.0–49.7)
MCH: 33.2 pg (ref 25.1–34.0)
MCHC: 33.5 g/dL (ref 31.5–36.0)
MCV: 99.1 fL (ref 79.5–101.0)
MONO#: 0.6 10*3/uL (ref 0.1–0.9)
MONO%: 10.6 % (ref 0.0–14.0)
NEUT%: 57.6 % (ref 38.4–76.8)
NEUTROS ABS: 3.1 10*3/uL (ref 1.5–6.5)
Platelets: 174 10*3/uL (ref 145–400)
RBC: 3.25 10*6/uL — ABNORMAL LOW (ref 3.70–5.45)
RDW: 17.5 % — ABNORMAL HIGH (ref 11.2–14.5)
WBC: 5.4 10*3/uL (ref 3.9–10.3)

## 2016-03-01 LAB — COMPREHENSIVE METABOLIC PANEL
ALBUMIN: 3.2 g/dL — AB (ref 3.5–5.0)
ALK PHOS: 124 U/L (ref 40–150)
ALT: 25 U/L (ref 0–55)
AST: 17 U/L (ref 5–34)
Anion Gap: 8 mEq/L (ref 3–11)
BUN: 13.4 mg/dL (ref 7.0–26.0)
CO2: 28 mEq/L (ref 22–29)
CREATININE: 0.7 mg/dL (ref 0.6–1.1)
Calcium: 9.5 mg/dL (ref 8.4–10.4)
Chloride: 103 mEq/L (ref 98–109)
GLUCOSE: 127 mg/dL (ref 70–140)
Potassium: 4.5 mEq/L (ref 3.5–5.1)
SODIUM: 139 meq/L (ref 136–145)
TOTAL PROTEIN: 6.6 g/dL (ref 6.4–8.3)

## 2016-03-01 LAB — MAGNESIUM: Magnesium: 1.7 mg/dl (ref 1.5–2.5)

## 2016-03-05 ENCOUNTER — Telehealth: Payer: Self-pay | Admitting: Pulmonary Disease

## 2016-03-05 MED ORDER — BUDESONIDE-FORMOTEROL FUMARATE 160-4.5 MCG/ACT IN AERO: 2.0000 | INHALATION_SPRAY | Freq: Two times a day (BID) | RESPIRATORY_TRACT | 12 refills | Status: DC

## 2016-03-05 NOTE — Telephone Encounter (Signed)
Rx sent to preferred pharmacy. LM to make pt aware of this. Nothing further needed.

## 2016-03-08 ENCOUNTER — Ambulatory Visit (HOSPITAL_BASED_OUTPATIENT_CLINIC_OR_DEPARTMENT_OTHER): Payer: 59

## 2016-03-08 ENCOUNTER — Telehealth: Payer: Self-pay | Admitting: Nurse Practitioner

## 2016-03-08 ENCOUNTER — Other Ambulatory Visit (HOSPITAL_BASED_OUTPATIENT_CLINIC_OR_DEPARTMENT_OTHER): Payer: 59

## 2016-03-08 ENCOUNTER — Ambulatory Visit (HOSPITAL_BASED_OUTPATIENT_CLINIC_OR_DEPARTMENT_OTHER): Payer: 59 | Admitting: Nurse Practitioner

## 2016-03-08 VITALS — BP 125/72 | HR 95 | Temp 97.8°F | Resp 18 | Ht 66.0 in | Wt 267.8 lb

## 2016-03-08 DIAGNOSIS — C3492 Malignant neoplasm of unspecified part of left bronchus or lung: Secondary | ICD-10-CM

## 2016-03-08 DIAGNOSIS — C3412 Malignant neoplasm of upper lobe, left bronchus or lung: Secondary | ICD-10-CM | POA: Diagnosis not present

## 2016-03-08 DIAGNOSIS — J910 Malignant pleural effusion: Secondary | ICD-10-CM | POA: Diagnosis not present

## 2016-03-08 DIAGNOSIS — Z5111 Encounter for antineoplastic chemotherapy: Secondary | ICD-10-CM

## 2016-03-08 LAB — CBC WITH DIFFERENTIAL/PLATELET
BASO%: 1 % (ref 0.0–2.0)
BASOS ABS: 0.1 10*3/uL (ref 0.0–0.1)
EOS ABS: 0.1 10*3/uL (ref 0.0–0.5)
EOS%: 0.9 % (ref 0.0–7.0)
HEMATOCRIT: 36.1 % (ref 34.8–46.6)
HEMOGLOBIN: 12.3 g/dL (ref 11.6–15.9)
LYMPH#: 1.5 10*3/uL (ref 0.9–3.3)
LYMPH%: 16.1 % (ref 14.0–49.7)
MCH: 34.1 pg — AB (ref 25.1–34.0)
MCHC: 34 g/dL (ref 31.5–36.0)
MCV: 100.1 fL (ref 79.5–101.0)
MONO#: 0.8 10*3/uL (ref 0.1–0.9)
MONO%: 7.9 % (ref 0.0–14.0)
NEUT%: 74.1 % (ref 38.4–76.8)
NEUTROS ABS: 7.1 10*3/uL — AB (ref 1.5–6.5)
Platelets: 337 10*3/uL (ref 145–400)
RBC: 3.61 10*6/uL — ABNORMAL LOW (ref 3.70–5.45)
RDW: 18.8 % — AB (ref 11.2–14.5)
WBC: 9.6 10*3/uL (ref 3.9–10.3)

## 2016-03-08 LAB — COMPREHENSIVE METABOLIC PANEL
ALBUMIN: 3.4 g/dL — AB (ref 3.5–5.0)
ALK PHOS: 106 U/L (ref 40–150)
ALT: 32 U/L (ref 0–55)
AST: 21 U/L (ref 5–34)
Anion Gap: 11 mEq/L (ref 3–11)
BILIRUBIN TOTAL: 0.27 mg/dL (ref 0.20–1.20)
BUN: 13.4 mg/dL (ref 7.0–26.0)
CALCIUM: 9.8 mg/dL (ref 8.4–10.4)
CO2: 24 mEq/L (ref 22–29)
CREATININE: 0.8 mg/dL (ref 0.6–1.1)
Chloride: 104 mEq/L (ref 98–109)
EGFR: 87 mL/min/{1.73_m2} — ABNORMAL LOW (ref >90)
GLUCOSE: 125 mg/dL (ref 70–140)
POTASSIUM: 4.1 meq/L (ref 3.5–5.1)
SODIUM: 138 meq/L (ref 136–145)
TOTAL PROTEIN: 7.1 g/dL (ref 6.4–8.3)

## 2016-03-08 LAB — MAGNESIUM: Magnesium: 2 mg/dl (ref 1.5–2.5)

## 2016-03-08 MED ORDER — SODIUM CHLORIDE 0.9 % IV SOLN
Freq: Once | INTRAVENOUS | Status: AC
  Administered 2016-03-08: 11:00:00 via INTRAVENOUS

## 2016-03-08 MED ORDER — SODIUM CHLORIDE 0.9 % IV SOLN
120.0000 mg/m2 | Freq: Once | INTRAVENOUS | Status: AC
  Administered 2016-03-08: 280 mg via INTRAVENOUS
  Filled 2016-03-08: qty 14

## 2016-03-08 MED ORDER — SODIUM CHLORIDE 0.9 % IV SOLN
Freq: Once | INTRAVENOUS | Status: AC
  Administered 2016-03-08: 14:00:00 via INTRAVENOUS
  Filled 2016-03-08: qty 5

## 2016-03-08 MED ORDER — MANNITOL 25 % IV SOLN
Freq: Once | INTRAVENOUS | Status: AC
  Administered 2016-03-08: 11:00:00 via INTRAVENOUS
  Filled 2016-03-08: qty 10

## 2016-03-08 MED ORDER — PALONOSETRON HCL INJECTION 0.25 MG/5ML
0.2500 mg | Freq: Once | INTRAVENOUS | Status: AC
  Administered 2016-03-08: 0.25 mg via INTRAVENOUS

## 2016-03-08 MED ORDER — PALONOSETRON HCL INJECTION 0.25 MG/5ML
INTRAVENOUS | Status: AC
  Filled 2016-03-08: qty 5

## 2016-03-08 MED ORDER — CISPLATIN CHEMO INJECTION 100MG/100ML
60.0000 mg/m2 | Freq: Once | INTRAVENOUS | Status: AC
  Administered 2016-03-08: 140 mg via INTRAVENOUS
  Filled 2016-03-08: qty 140

## 2016-03-08 NOTE — Telephone Encounter (Signed)
Appointments scheduled per 11/13 LOS. Patient given AVS report and calendars with future scheduled appointments. Patient aware of CT scan, given two bottles of contrast and instructions.

## 2016-03-08 NOTE — Progress Notes (Signed)
  [facility] OFFICE PROGRESS NOTE   DIAGNOSIS: Extensive stage (T2b, N2, M1a) small cell lung cancer, diagnosed in July 2017 and presented with large left upper lobe lung mass and mediastinal lymphadenopathy and malignant left pleural effusion.  PRIOR THERAPY: None.  CURRENT THERAPY: Systemic chemotherapy with cisplatin 60 MG/M2 on day 1 and etoposide 120 MG/M2 on days 1, 2 and 3. Status post 5 cycles.    INTERVAL HISTORY:   Sharon [name] returns as scheduled. She completed cycle 5 of cisplatin/etoposide beginning 02/16/2016. She tends to develop nausea day 1. The nausea continues intermittently. Compazine is effective. No mouth sores. No diarrhea. No numbness or tingling in her hands or feet. No tinnitus. No hearing loss. She intermittently notes a mild "ocean sound" in her right ear at nighttime. No shortness of breath except when she becomes anxious.  Objective:  Vital signs in last 24 hours:  Blood pressure 125/72, pulse 95, temperature 97.8 F (36.6 C), temperature source Oral, resp. rate 18, height '5\' 6"'$  (1.676 m), weight 267 lb 12.8 oz (121.5 kg), SpO2 97 %.    HEENT: No thrush or ulcers. No significant cerumen present in either ear canal. Resp: Lungs clear bilaterally. Cardio: Regular rate and rhythm. GI: Abdomen soft and nontender. No hepatomegaly. Vascular: No leg edema. Calves soft and nontender. Neuro: Alert and oriented.  Skin: No rash.    Lab Results:  Lab Results  Component Value Date   WBC 9.6 03/08/2016   HGB 12.3 03/08/2016   HCT 36.1 03/08/2016   MCV 100.1 03/08/2016   PLT 337 03/08/2016   NEUTROABS 7.1 (H) 03/08/2016    Imaging:  No results found.  Medications: I have reviewed the patient's current medications.  Assessment/Plan: 1. Small cell lung cancer likely extensive stage disease with bilateral cervical lymphadenopathy and questionable adrenal lesion currently under active treatment with cisplatin/etoposide. She has completed 5  cycles.   Disposition: Sharon [name] appears stable. She has completed 5 cycles of cisplatin/etoposide. Plan to proceed with the sixth and final cycle today as scheduled. Dr. Julien Nordmann recommends restaging CT scans in approximately 3 weeks. She will return for a follow-up visit on 03/29/2016 to review the results. She will contact the office in the interim with any problems.  Plan reviewed with Dr. Julien Nordmann.  Ned Card ANP/GNP-BC   03/08/2016  10:25 AM

## 2016-03-08 NOTE — Patient Instructions (Signed)
[facility] Discharge Instructions for Patients Receiving Chemotherapy  Today you received the following chemotherapy agents: Cisplatin, Etoposide   To help prevent nausea and vomiting after your treatment, we encourage you to take your nausea medication as prescribed.    If you develop nausea and vomiting that is not controlled by your nausea medication, call the clinic.   BELOW ARE SYMPTOMS THAT SHOULD BE REPORTED IMMEDIATELY:  *FEVER GREATER THAN 100.5 F  *CHILLS WITH OR WITHOUT FEVER  NAUSEA AND VOMITING THAT IS NOT CONTROLLED WITH YOUR NAUSEA MEDICATION  *UNUSUAL SHORTNESS OF BREATH  *UNUSUAL BRUISING OR BLEEDING  TENDERNESS IN MOUTH AND THROAT WITH OR WITHOUT PRESENCE OF ULCERS  *URINARY PROBLEMS  *BOWEL PROBLEMS  UNUSUAL RASH Items with * indicate a potential emergency and should be followed up as soon as possible.  Feel free to call the clinic you have any questions or concerns. The clinic phone number is [phone].  Please show the [place] [facility] at check-in to the Emergency Department and triage nurse.

## 2016-03-09 ENCOUNTER — Ambulatory Visit (HOSPITAL_BASED_OUTPATIENT_CLINIC_OR_DEPARTMENT_OTHER): Payer: 59

## 2016-03-09 ENCOUNTER — Other Ambulatory Visit: Payer: Self-pay | Admitting: Internal Medicine

## 2016-03-09 VITALS — BP 147/77 | HR 72 | Temp 97.1°F | Resp 18

## 2016-03-09 DIAGNOSIS — C3492 Malignant neoplasm of unspecified part of left bronchus or lung: Secondary | ICD-10-CM | POA: Diagnosis not present

## 2016-03-09 DIAGNOSIS — Z5111 Encounter for antineoplastic chemotherapy: Secondary | ICD-10-CM

## 2016-03-09 MED ORDER — DEXAMETHASONE SODIUM PHOSPHATE 10 MG/ML IJ SOLN
INTRAMUSCULAR | Status: AC
  Filled 2016-03-09: qty 1

## 2016-03-09 MED ORDER — DEXAMETHASONE SODIUM PHOSPHATE 10 MG/ML IJ SOLN
10.0000 mg | Freq: Once | INTRAMUSCULAR | Status: AC
  Administered 2016-03-09: 10 mg via INTRAVENOUS

## 2016-03-09 MED ORDER — SODIUM CHLORIDE 0.9 % IV SOLN
120.0000 mg/m2 | Freq: Once | INTRAVENOUS | Status: AC
  Administered 2016-03-09: 280 mg via INTRAVENOUS
  Filled 2016-03-09: qty 14

## 2016-03-09 MED ORDER — SODIUM CHLORIDE 0.9 % IV SOLN
Freq: Once | INTRAVENOUS | Status: AC
  Administered 2016-03-09: 15:00:00 via INTRAVENOUS

## 2016-03-09 NOTE — Patient Instructions (Signed)
[name] Discharge Instructions for Patients Receiving Chemotherapy  Today you received the following chemotherapy agents [id number]  To help prevent nausea and vomiting after your treatment, we encourage you to take your nausea medication.   If you develop nausea and vomiting that is not controlled by your nausea medication, call the clinic.   BELOW ARE SYMPTOMS THAT SHOULD BE REPORTED IMMEDIATELY:  *FEVER GREATER THAN 100.5 F  *CHILLS WITH OR WITHOUT FEVER  NAUSEA AND VOMITING THAT IS NOT CONTROLLED WITH YOUR NAUSEA MEDICATION  *UNUSUAL SHORTNESS OF BREATH  *UNUSUAL BRUISING OR BLEEDING  TENDERNESS IN MOUTH AND THROAT WITH OR WITHOUT PRESENCE OF ULCERS  *URINARY PROBLEMS  *BOWEL PROBLEMS  UNUSUAL RASH Items with * indicate a potential emergency and should be followed up as soon as possible.  Feel free to call the clinic you have any questions or concerns. The clinic phone number is [phone].  Please show the [place] at check-in to the Emergency Department and triage nurse.

## 2016-03-10 ENCOUNTER — Ambulatory Visit (HOSPITAL_BASED_OUTPATIENT_CLINIC_OR_DEPARTMENT_OTHER): Payer: 59

## 2016-03-10 DIAGNOSIS — Z5111 Encounter for antineoplastic chemotherapy: Secondary | ICD-10-CM | POA: Diagnosis not present

## 2016-03-10 DIAGNOSIS — C3492 Malignant neoplasm of unspecified part of left bronchus or lung: Secondary | ICD-10-CM | POA: Diagnosis not present

## 2016-03-10 MED ORDER — SODIUM CHLORIDE 0.9 % IV SOLN
Freq: Once | INTRAVENOUS | Status: AC
  Administered 2016-03-10: 15:00:00 via INTRAVENOUS

## 2016-03-10 MED ORDER — DEXAMETHASONE SODIUM PHOSPHATE 10 MG/ML IJ SOLN
10.0000 mg | Freq: Once | INTRAMUSCULAR | Status: AC
  Administered 2016-03-10: 10 mg via INTRAVENOUS

## 2016-03-10 MED ORDER — DEXAMETHASONE SODIUM PHOSPHATE 10 MG/ML IJ SOLN
INTRAMUSCULAR | Status: AC
  Filled 2016-03-10: qty 1

## 2016-03-10 MED ORDER — SODIUM CHLORIDE 0.9 % IV SOLN
120.0000 mg/m2 | Freq: Once | INTRAVENOUS | Status: AC
  Administered 2016-03-10: 280 mg via INTRAVENOUS
  Filled 2016-03-10: qty 14

## 2016-03-10 NOTE — Patient Instructions (Signed)
[facility] Discharge Instructions for Patients Receiving Chemotherapy  Today you received the following chemotherapy agents: Etoposide   To help prevent nausea and vomiting after your treatment, we encourage you to take your nausea medication as directed.    If you develop nausea and vomiting that is not controlled by your nausea medication, call the clinic.   BELOW ARE SYMPTOMS THAT SHOULD BE REPORTED IMMEDIATELY:  *FEVER GREATER THAN 100.5 F  *CHILLS WITH OR WITHOUT FEVER  NAUSEA AND VOMITING THAT IS NOT CONTROLLED WITH YOUR NAUSEA MEDICATION  *UNUSUAL SHORTNESS OF BREATH  *UNUSUAL BRUISING OR BLEEDING  TENDERNESS IN MOUTH AND THROAT WITH OR WITHOUT PRESENCE OF ULCERS  *URINARY PROBLEMS  *BOWEL PROBLEMS  UNUSUAL RASH Items with * indicate a potential emergency and should be followed up as soon as possible.  Feel free to call the clinic you have any questions or concerns. The clinic phone number is [phone].  Please show the CHEMO ALERT CARD at check-in to the Emergency Department and triage nurse.   

## 2016-03-11 ENCOUNTER — Other Ambulatory Visit: Payer: Self-pay | Admitting: Internal Medicine

## 2016-03-11 ENCOUNTER — Telehealth: Payer: Self-pay | Admitting: *Deleted

## 2016-03-11 DIAGNOSIS — C3492 Malignant neoplasm of unspecified part of left bronchus or lung: Secondary | ICD-10-CM

## 2016-03-11 MED ORDER — PROCHLORPERAZINE MALEATE 10 MG PO TABS: 10.0000 mg | ORAL_TABLET | Freq: Four times a day (QID) | ORAL | 1 refills | Status: DC | PRN

## 2016-03-11 NOTE — Telephone Encounter (Signed)
"  I called for the nausea refill and pharmacy has still not received this.  I also need to schedule scans in the afternoon." Will send compazine refill and provided radiology Central Scheduling number.

## 2016-03-12 ENCOUNTER — Ambulatory Visit (HOSPITAL_BASED_OUTPATIENT_CLINIC_OR_DEPARTMENT_OTHER): Payer: 59

## 2016-03-12 ENCOUNTER — Other Ambulatory Visit: Payer: Self-pay | Admitting: *Deleted

## 2016-03-12 VITALS — BP 150/72 | HR 91 | Temp 97.7°F | Resp 20

## 2016-03-12 DIAGNOSIS — Z5189 Encounter for other specified aftercare: Secondary | ICD-10-CM | POA: Diagnosis not present

## 2016-03-12 DIAGNOSIS — C3492 Malignant neoplasm of unspecified part of left bronchus or lung: Secondary | ICD-10-CM

## 2016-03-12 MED ORDER — PEGFILGRASTIM INJECTION 6 MG/0.6ML ~~LOC~~
6.0000 mg | PREFILLED_SYRINGE | Freq: Once | SUBCUTANEOUS | Status: AC
  Administered 2016-03-12: 6 mg via SUBCUTANEOUS
  Filled 2016-03-12: qty 0.6

## 2016-03-12 NOTE — Patient Instructions (Signed)
Pegfilgrastim injection What is this medicine? PEGFILGRASTIM (PEG fil gra stim) is a long-acting granulocyte colony-stimulating factor that stimulates the growth of neutrophils, a type of white blood cell important in the body's fight against infection. It is used to reduce the incidence of fever and infection in patients with certain types of cancer who are receiving chemotherapy that affects the bone marrow, and to increase survival after being exposed to high doses of radiation. This medicine may be used for other purposes; ask your health care provider or pharmacist if you have questions. What should I tell my health care provider before I take this medicine? They need to know if you have any of these conditions: -kidney disease -latex allergy -ongoing radiation therapy -sickle cell disease -skin reactions to acrylic adhesives (On-Body Injector only) -an unusual or allergic reaction to pegfilgrastim, filgrastim, other medicines, foods, dyes, or preservatives -pregnant or trying to get pregnant -breast-feeding How should I use this medicine? This medicine is for injection under the skin. If you get this medicine at home, you will be taught how to prepare and give the pre-filled syringe or how to use the On-body Injector. Refer to the patient Instructions for Use for detailed instructions. Use exactly as directed. Take your medicine at regular intervals. Do not take your medicine more often than directed. It is important that you put your used needles and syringes in a special sharps container. Do not put them in a trash can. If you do not have a sharps container, call your pharmacist or healthcare provider to get one. Talk to your pediatrician regarding the use of this medicine in children. While this drug may be prescribed for selected conditions, precautions do apply. Overdosage: If you think you have taken too much of this medicine contact a poison control center or emergency room at  once. NOTE: This medicine is only for you. Do not share this medicine with others. What if I miss a dose? It is important not to miss your dose. Call your doctor or health care professional if you miss your dose. If you miss a dose due to an On-body Injector failure or leakage, a new dose should be administered as soon as possible using a single prefilled syringe for manual use. What may interact with this medicine? Interactions have not been studied. Give your health care provider a list of all the medicines, herbs, non-prescription drugs, or dietary supplements you use. Also tell them if you smoke, drink alcohol, or use illegal drugs. Some items may interact with your medicine. This list may not describe all possible interactions. Give your health care provider a list of all the medicines, herbs, non-prescription drugs, or dietary supplements you use. Also tell them if you smoke, drink alcohol, or use illegal drugs. Some items may interact with your medicine. What should I watch for while using this medicine? You may need blood work done while you are taking this medicine. If you are going to need a MRI, CT scan, or other procedure, tell your doctor that you are using this medicine (On-Body Injector only). What side effects may I notice from receiving this medicine? Side effects that you should report to your doctor or health care professional as soon as possible: -allergic reactions like skin rash, itching or hives, swelling of the face, lips, or tongue -dizziness -fever -pain, redness, or irritation at site where injected -pinpoint red spots on the skin -red or dark-brown urine -shortness of breath or breathing problems -stomach or side pain, or pain   at the shoulder -swelling -tiredness -trouble passing urine or change in the amount of urine Side effects that usually do not require medical attention (report to your doctor or health care professional if they continue or are  bothersome): -bone pain -muscle pain This list may not describe all possible side effects. Call your doctor for medical advice about side effects. You may report side effects to FDA at 1-800-FDA-1088. Where should I keep my medicine? Keep out of the reach of children. Store pre-filled syringes in a refrigerator between 2 and 8 degrees C (36 and 46 degrees F). Do not freeze. Keep in carton to protect from light. Throw away this medicine if it is left out of the refrigerator for more than 48 hours. Throw away any unused medicine after the expiration date. NOTE: This sheet is a summary. It may not cover all possible information. If you have questions about this medicine, talk to your doctor, pharmacist, or health care provider.    2016, [employer]vier/[employer] Standard. (2014-05-02 14:30:14)  

## 2016-03-12 NOTE — Telephone Encounter (Signed)
Refill request for compazine from pt pharmacy. Medication filled 11/16.

## 2016-03-15 ENCOUNTER — Other Ambulatory Visit: Payer: Self-pay | Admitting: Medical Oncology

## 2016-03-15 ENCOUNTER — Other Ambulatory Visit (HOSPITAL_BASED_OUTPATIENT_CLINIC_OR_DEPARTMENT_OTHER): Payer: 59

## 2016-03-15 DIAGNOSIS — C3492 Malignant neoplasm of unspecified part of left bronchus or lung: Secondary | ICD-10-CM

## 2016-03-15 LAB — CBC WITH DIFFERENTIAL/PLATELET
BASO%: 0.5 % (ref 0.0–2.0)
Basophils Absolute: 0.1 10*3/uL (ref 0.0–0.1)
EOS%: 0 % (ref 0.0–7.0)
Eosinophils Absolute: 0 10*3/uL (ref 0.0–0.5)
HCT: 33.3 % — ABNORMAL LOW (ref 34.8–46.6)
HGB: 11.1 g/dL — ABNORMAL LOW (ref 11.6–15.9)
LYMPH%: 7.6 % — AB (ref 14.0–49.7)
MCH: 34.1 pg — ABNORMAL HIGH (ref 25.1–34.0)
MCHC: 33.3 g/dL (ref 31.5–36.0)
MCV: 102.4 fL — ABNORMAL HIGH (ref 79.5–101.0)
MONO#: 0.3 10*3/uL (ref 0.1–0.9)
MONO%: 1.6 % (ref 0.0–14.0)
NEUT#: 15.2 10*3/uL — ABNORMAL HIGH (ref 1.5–6.5)
NEUT%: 90.3 % — AB (ref 38.4–76.8)
Platelets: 204 10*3/uL (ref 145–400)
RBC: 3.25 10*6/uL — AB (ref 3.70–5.45)
RDW: 17.1 % — ABNORMAL HIGH (ref 11.2–14.5)
WBC: 16.9 10*3/uL — AB (ref 3.9–10.3)
lymph#: 1.3 10*3/uL (ref 0.9–3.3)

## 2016-03-15 LAB — COMPREHENSIVE METABOLIC PANEL
ALT: 26 U/L (ref 0–55)
ANION GAP: 7 meq/L (ref 3–11)
AST: 16 U/L (ref 5–34)
Albumin: 3.2 g/dL — ABNORMAL LOW (ref 3.5–5.0)
Alkaline Phosphatase: 142 U/L (ref 40–150)
BUN: 17.4 mg/dL (ref 7.0–26.0)
CHLORIDE: 104 meq/L (ref 98–109)
CO2: 27 meq/L (ref 22–29)
CREATININE: 0.7 mg/dL (ref 0.6–1.1)
Calcium: 9.6 mg/dL (ref 8.4–10.4)
EGFR: >90 mL/min/{1.73_m2} (ref >90)
GLUCOSE: 138 mg/dL (ref 70–140)
Potassium: 4.6 mEq/L (ref 3.5–5.1)
SODIUM: 138 meq/L (ref 136–145)
Total Bilirubin: 0.51 mg/dL (ref 0.20–1.20)
Total Protein: 6.5 g/dL (ref 6.4–8.3)

## 2016-03-15 LAB — MAGNESIUM: Magnesium: 1.6 mg/dl (ref 1.5–2.5)

## 2016-03-22 ENCOUNTER — Other Ambulatory Visit (HOSPITAL_BASED_OUTPATIENT_CLINIC_OR_DEPARTMENT_OTHER): Payer: 59

## 2016-03-22 DIAGNOSIS — C3492 Malignant neoplasm of unspecified part of left bronchus or lung: Secondary | ICD-10-CM

## 2016-03-22 LAB — CBC WITH DIFFERENTIAL/PLATELET
BASO%: 1 % (ref 0.0–2.0)
BASOS ABS: 0.1 10*3/uL (ref 0.0–0.1)
EOS ABS: 0 10*3/uL (ref 0.0–0.5)
EOS%: 0.7 % (ref 0.0–7.0)
HEMATOCRIT: 31.7 % — AB (ref 34.8–46.6)
HEMOGLOBIN: 10.5 g/dL — AB (ref 11.6–15.9)
LYMPH#: 1.6 10*3/uL (ref 0.9–3.3)
LYMPH%: 26.7 % (ref 14.0–49.7)
MCH: 34.1 pg — AB (ref 25.1–34.0)
MCHC: 33.1 g/dL (ref 31.5–36.0)
MCV: 102.9 fL — ABNORMAL HIGH (ref 79.5–101.0)
MONO#: 0.7 10*3/uL (ref 0.1–0.9)
MONO%: 11.3 % (ref 0.0–14.0)
NEUT#: 3.5 10*3/uL (ref 1.5–6.5)
NEUT%: 60.3 % (ref 38.4–76.8)
PLATELETS: 116 10*3/uL — AB (ref 145–400)
RBC: 3.08 10*6/uL — ABNORMAL LOW (ref 3.70–5.45)
RDW: 16.3 % — ABNORMAL HIGH (ref 11.2–14.5)
WBC: 5.9 10*3/uL (ref 3.9–10.3)

## 2016-03-22 LAB — COMPREHENSIVE METABOLIC PANEL
ALBUMIN: 3.2 g/dL — AB (ref 3.5–5.0)
ALK PHOS: 123 U/L (ref 40–150)
ALT: 24 U/L (ref 0–55)
ANION GAP: 9 meq/L (ref 3–11)
AST: 13 U/L (ref 5–34)
BUN: 12.5 mg/dL (ref 7.0–26.0)
CALCIUM: 9.6 mg/dL (ref 8.4–10.4)
CHLORIDE: 103 meq/L (ref 98–109)
CO2: 27 mEq/L (ref 22–29)
CREATININE: 0.8 mg/dL (ref 0.6–1.1)
EGFR: 86 mL/min/{1.73_m2} — ABNORMAL LOW (ref >90)
Glucose: 145 mg/dl — ABNORMAL HIGH (ref 70–140)
POTASSIUM: 4.4 meq/L (ref 3.5–5.1)
Sodium: 140 mEq/L (ref 136–145)
Total Bilirubin: <0.22 mg/dL (ref 0.20–1.20)
Total Protein: 6.6 g/dL (ref 6.4–8.3)

## 2016-03-22 LAB — MAGNESIUM: MAGNESIUM: 2 mg/dL (ref 1.5–2.5)

## 2016-03-26 ENCOUNTER — Ambulatory Visit (HOSPITAL_COMMUNITY)
Admission: RE | Admit: 2016-03-26 | Discharge: 2016-03-26 | Disposition: A | Discharge status: Home / Self Care | Payer: 59 | Source: Ambulatory Visit | Attending: Nurse Practitioner | Admitting: Nurse Practitioner

## 2016-03-26 ENCOUNTER — Ambulatory Visit (HOSPITAL_COMMUNITY): Payer: 59

## 2016-03-26 ENCOUNTER — Encounter (HOSPITAL_COMMUNITY): Payer: Self-pay

## 2016-03-26 DIAGNOSIS — K760 Fatty (change of) liver, not elsewhere classified: Secondary | ICD-10-CM | POA: Insufficient documentation

## 2016-03-26 DIAGNOSIS — R911 Solitary pulmonary nodule: Secondary | ICD-10-CM | POA: Insufficient documentation

## 2016-03-26 DIAGNOSIS — C3492 Malignant neoplasm of unspecified part of left bronchus or lung: Secondary | ICD-10-CM | POA: Insufficient documentation

## 2016-03-29 ENCOUNTER — Telehealth: Payer: Self-pay | Admitting: Internal Medicine

## 2016-03-29 ENCOUNTER — Ambulatory Visit (HOSPITAL_BASED_OUTPATIENT_CLINIC_OR_DEPARTMENT_OTHER): Payer: 59 | Admitting: Internal Medicine

## 2016-03-29 ENCOUNTER — Encounter: Payer: Self-pay | Admitting: Internal Medicine

## 2016-03-29 ENCOUNTER — Other Ambulatory Visit (HOSPITAL_BASED_OUTPATIENT_CLINIC_OR_DEPARTMENT_OTHER): Payer: 59

## 2016-03-29 ENCOUNTER — Encounter: Payer: Self-pay | Admitting: Radiation Oncology

## 2016-03-29 VITALS — BP 142/71 | HR 87 | Temp 98.5°F | Resp 20 | Ht 66.0 in | Wt 271.6 lb

## 2016-03-29 DIAGNOSIS — C3412 Malignant neoplasm of upper lobe, left bronchus or lung: Secondary | ICD-10-CM | POA: Diagnosis not present

## 2016-03-29 DIAGNOSIS — C3492 Malignant neoplasm of unspecified part of left bronchus or lung: Secondary | ICD-10-CM

## 2016-03-29 DIAGNOSIS — D6481 Anemia due to antineoplastic chemotherapy: Secondary | ICD-10-CM | POA: Diagnosis not present

## 2016-03-29 DIAGNOSIS — Z853 Personal history of malignant neoplasm of breast: Secondary | ICD-10-CM

## 2016-03-29 DIAGNOSIS — J449 Chronic obstructive pulmonary disease, unspecified: Secondary | ICD-10-CM

## 2016-03-29 DIAGNOSIS — Z5111 Encounter for antineoplastic chemotherapy: Secondary | ICD-10-CM

## 2016-03-29 HISTORY — DX: Personal history of malignant neoplasm of breast: Z85.3

## 2016-03-29 LAB — CBC WITH DIFFERENTIAL/PLATELET
BASO%: 0.3 % (ref 0.0–2.0)
Basophils Absolute: 0 10*3/uL (ref 0.0–0.1)
EOS%: 1 % (ref 0.0–7.0)
Eosinophils Absolute: 0.1 10*3/uL (ref 0.0–0.5)
HCT: 31.8 % — ABNORMAL LOW (ref 34.8–46.6)
HEMOGLOBIN: 10.6 g/dL — AB (ref 11.6–15.9)
LYMPH%: 16.5 % (ref 14.0–49.7)
MCH: 34.4 pg — AB (ref 25.1–34.0)
MCHC: 33.3 g/dL (ref 31.5–36.0)
MCV: 103.2 fL — ABNORMAL HIGH (ref 79.5–101.0)
MONO#: 0.6 10*3/uL (ref 0.1–0.9)
MONO%: 6.5 % (ref 0.0–14.0)
NEUT%: 75.7 % (ref 38.4–76.8)
NEUTROS ABS: 6.6 10*3/uL — AB (ref 1.5–6.5)
Platelets: 222 10*3/uL (ref 145–400)
RBC: 3.08 10*6/uL — AB (ref 3.70–5.45)
RDW: 15.8 % — AB (ref 11.2–14.5)
WBC: 8.7 10*3/uL (ref 3.9–10.3)
lymph#: 1.4 10*3/uL (ref 0.9–3.3)

## 2016-03-29 LAB — COMPREHENSIVE METABOLIC PANEL
ALBUMIN: 3.1 g/dL — AB (ref 3.5–5.0)
ALK PHOS: 110 U/L (ref 40–150)
ALT: 26 U/L (ref 0–55)
AST: 17 U/L (ref 5–34)
Anion Gap: 11 mEq/L (ref 3–11)
BUN: 14.6 mg/dL (ref 7.0–26.0)
CO2: 24 meq/L (ref 22–29)
Calcium: 9.4 mg/dL (ref 8.4–10.4)
Chloride: 103 mEq/L (ref 98–109)
Creatinine: 0.8 mg/dL (ref 0.6–1.1)
EGFR: 82 mL/min/{1.73_m2} — AB (ref >90)
GLUCOSE: 140 mg/dL (ref 70–140)
POTASSIUM: 4 meq/L (ref 3.5–5.1)
SODIUM: 138 meq/L (ref 136–145)
TOTAL PROTEIN: 6.6 g/dL (ref 6.4–8.3)
Total Bilirubin: <0.22 mg/dL (ref 0.20–1.20)

## 2016-03-29 LAB — MAGNESIUM: Magnesium: 1.8 mg/dl (ref 1.5–2.5)

## 2016-03-29 NOTE — Telephone Encounter (Signed)
Appointments scheduled per 12/4 LOS. Patient given AVS report and calendars with future scheduled appointments.  Patient aware of ct scan..will pick up contrast for scan closer to scan date.

## 2016-03-29 NOTE — Progress Notes (Signed)
[facility] [phone]   [phone]  OFFICE PROGRESS NOTE  Sharon Amber, PA [place] [address]  DIAGNOSIS: Extensive stage (T2b, N2, M1 8) small cell lung cancer presented with large left upper lobe lung mass and mediastinal lymphadenopathy as well as malignant left pleural effusion diagnosed in July 2017.  PRIOR THERAPY: None  CURRENT THERAPY: Systemic chemotherapy with cisplatin 60 MG/M2 on day 1 and etoposide at 120 MG/M2 on days 1, 2 and 3 with Neulasta support on day 4. She is status post 6 cycles.  INTERVAL HISTORY: Sharon [name] 60 y.o. female returns to the clinic today for follow-up visit accompanied by her daughter and son-in-law. She is a very pleasant 30 years old white female with extensive stage small cell lung cancer who completed 6 cycles of systemic chemotherapy with cisplatin and etoposide. The patient tolerated her treatment well except for fatigue. She is complaining of cold symptoms today. Several family members are sick. She also has diarrhea after the oral contrast for the CT scan. She denied having any significant chest pain, shortness of breath but has mild cough with no hemoptysis. She denied having any significant weight loss or night sweats. She had repeat CT scan of the chest, abdomen and pelvis and she is here today for evaluation and discussion of her scan results.  MEDICAL HISTORY: Past Medical History:  Diagnosis Date  . Cancer of right breast ([place]) 2009  . CAP (community acquired pneumonia) 11/06/2015  . COPD (chronic obstructive pulmonary disease) ([facility])   . Elevated blood pressure   . Encounter for antineoplastic chemotherapy 01/05/2016  . Hoarseness of voice    "for the last 2 months" (11/06/2015)  . Lung mass dx'd 10/2015  . Menopause   . Pre-diabetes   . Small cell lung cancer ([place])   . Vitamin D deficiency     ALLERGIES:  has No Known Allergies.  MEDICATIONS:  Current  Outpatient Prescriptions  Medication Sig Dispense Refill  . albuterol (PROVENTIL HFA;VENTOLIN HFA) 108 (90 Base) MCG/ACT inhaler Inhale 1 puff into the lungs every 6 (six) hours as needed for wheezing or shortness of breath.    . budesonide-formoterol (SYMBICORT) 160-4.5 MCG/ACT inhaler Inhale 2 puffs into the lungs 2 (two) times daily. 1 Inhaler 12  . fluticasone (FLONASE) 50 MCG/ACT nasal spray Place 2 sprays into both nostrils daily as needed.     . montelukast (SINGULAIR) 10 MG tablet Take 10 mg by mouth daily as needed.     . prochlorperazine (COMPAZINE) 10 MG tablet TAKE 1 TABLET BY MOUTH EVERY 6 HOURS AS NEEDED FOR NAUSEA AND VOMITING 360 tablet 1   No current facility-administered medications for this visit.     SURGICAL HISTORY:  Past Surgical History:  Procedure Laterality Date  . BREAST BIOPSY Right 2009  . BREAST LUMPECTOMY Right 2009  . UTERINE FIBROID SURGERY  2000  . VAGINAL HYSTERECTOMY  2000  . VESICOVAGINAL FISTULA CLOSURE W/ TAH  2000  . VIDEO BRONCHOSCOPY WITH ENDOBRONCHIAL ULTRASOUND N/A 11/10/2015   Procedure: VIDEO BRONCHOSCOPY WITH ENDOBRONCHIAL ULTRASOUND;  Surgeon: Javier Glazier, MD;  Location: [facility];  Service: Thoracic;  Laterality: N/A;    REVIEW OF SYSTEMS:  Constitutional: negative Eyes: negative Ears, nose, mouth, throat, and face: positive for nasal congestion Respiratory: positive for cough Cardiovascular: negative for chest pain, chest pressure/discomfort and palpitations Gastrointestinal: negative Genitourinary:negative for dysuria, frequency and hematuria Integument/breast: negative for dryness and rash Hematologic/lymphatic: negative for  bleeding and easy bruising Musculoskeletal:negative Neurological: negative Behavioral/Psych: negative Endocrine: negative Allergic/Immunologic: negative   PHYSICAL EXAMINATION: General appearance: alert, cooperative and no distress Head: Normocephalic, without obvious abnormality, atraumatic Neck: no  adenopathy, no JVD, supple, symmetrical, trachea midline and thyroid not enlarged, symmetric, no tenderness/mass/nodules Lymph nodes: Cervical, supraclavicular, and axillary nodes normal. Resp: clear to auscultation bilaterally Back: symmetric, no curvature. ROM normal. No CVA tenderness. Cardio: regular rate and rhythm, S1, S2 normal, no murmur, click, rub or gallop GI: soft, non-tender; bowel sounds normal; no masses,  no organomegaly Extremities: extremities normal, atraumatic, no cyanosis or edema Neurologic: Alert and oriented X 3, normal strength and tone. Normal symmetric reflexes. Normal coordination and gait  ECOG PERFORMANCE STATUS: 1 - Symptomatic but completely ambulatory  Blood pressure (!) 142/71, pulse 87, temperature 98.5 F (36.9 C), temperature source Oral, resp. rate 20, height '5\' 6"'$  (1.676 m), weight 271 lb 9.6 oz (123.2 kg), SpO2 99 %.  LABORATORY DATA: Lab Results  Component Value Date   WBC 8.7 03/29/2016   HGB 10.6 (L) 03/29/2016   HCT 31.8 (L) 03/29/2016   MCV 103.2 (H) 03/29/2016   PLT 222 03/29/2016      Chemistry      Component Value Date/Time   NA 138 03/29/2016 0934   K 4.0 03/29/2016 0934   CL 102 11/07/2015 0140   CL 104 07/18/2012 1451   CO2 24 03/29/2016 0934   BUN 14.6 03/29/2016 0934   CREATININE 0.8 03/29/2016 0934      Component Value Date/Time   CALCIUM 9.4 03/29/2016 0934   ALKPHOS 110 03/29/2016 0934   AST 17 03/29/2016 0934   ALT 26 03/29/2016 0934   BILITOT <0.22 03/29/2016 0934       RADIOGRAPHIC STUDIES: Ct Chest W Contrast  Result Date: 03/27/2016 CLINICAL DATA:  Restaging lung cancer diagnosed in July 2017. Remote history of right breast cancer. Chemotherapy completed. EXAM: CT CHEST AND ABDOMEN WITH CONTRAST TECHNIQUE: Multidetector CT imaging of the chest and abdomen was performed following the standard protocol during bolus administration of intravenous contrast. CONTRAST:  100 ml Isovue-300. COMPARISON:  Chest CT  01/07/2016.  PET-CT 11/19/2015. FINDINGS: CT CHEST FINDINGS Cardiovascular: There are no significant vascular findings. The heart size is normal. There is no pericardial effusion. Mediastinum/Nodes: There are no enlarged mediastinal, hilar or axillary lymph nodes. The thyroid gland, trachea and esophagus demonstrate no significant findings. Lungs/Pleura: There is no pleural effusion. Further regression in spiculated left upper lobe nodule, now largely linear and measuring 1.6 x 0.6 cm on image 47. No new or enlarging pulmonary nodules are seen. There is no residual airspace disease or endobronchial abnormality. Musculoskeletal/Chest wall: No chest wall mass or suspicious osseous findings. Grossly stable thoracic spine degenerative changes. CT ABDOMEN FINDINGS Hepatobiliary: The liver appears grossly stable with heterogeneous steatosis. There are areas of focal sparing which are similar to previous PET-CT. No focally suspicious lesions are seen. There is a stable calcified granuloma in the right hepatic dome. The gallbladder is incompletely distended. No evidence of gallstones, gallbladder wall thickening or biliary dilatation. Pancreas: Unremarkable. No pancreatic ductal dilatation or surrounding inflammatory changes. Spleen: Normal in size without focal abnormality. Adrenals/Urinary Tract: The adrenal glands appear stable without suspicious findings. Both kidneys appear normal. No evidence of urinary tract calculus or hydronephrosis. Stomach/Bowel: No evidence of bowel wall thickening, distention or surrounding inflammatory change. The appendix appears normal. Pelvis not imaged. Vascular/Lymphatic: There are no enlarged abdominal lymph nodes. Mild aortic atherosclerosis. No evidence of large vessel  occlusion. Other: No ascites or peritoneal nodularity. Musculoskeletal: No acute or significant osseous findings. IMPRESSION: 1. Further regression of left upper lobe nodule with only linear density remaining, likely  post treatment scarring. 2. No evidence of residual adenopathy or new metastatic disease. 3. Heterogeneous hepatic steatosis. Electronically Signed   By: Richardean Sale M.D.   On: 03/27/2016 11:12   Ct Abdomen W Contrast  Result Date: 03/27/2016 CLINICAL DATA:  Restaging lung cancer diagnosed in July 2017. Remote history of right breast cancer. Chemotherapy completed. EXAM: CT CHEST AND ABDOMEN WITH CONTRAST TECHNIQUE: Multidetector CT imaging of the chest and abdomen was performed following the standard protocol during bolus administration of intravenous contrast. CONTRAST:  100 ml Isovue-300. COMPARISON:  Chest CT 01/07/2016.  PET-CT 11/19/2015. FINDINGS: CT CHEST FINDINGS Cardiovascular: There are no significant vascular findings. The heart size is normal. There is no pericardial effusion. Mediastinum/Nodes: There are no enlarged mediastinal, hilar or axillary lymph nodes. The thyroid gland, trachea and esophagus demonstrate no significant findings. Lungs/Pleura: There is no pleural effusion. Further regression in spiculated left upper lobe nodule, now largely linear and measuring 1.6 x 0.6 cm on image 47. No new or enlarging pulmonary nodules are seen. There is no residual airspace disease or endobronchial abnormality. Musculoskeletal/Chest wall: No chest wall mass or suspicious osseous findings. Grossly stable thoracic spine degenerative changes. CT ABDOMEN FINDINGS Hepatobiliary: The liver appears grossly stable with heterogeneous steatosis. There are areas of focal sparing which are similar to previous PET-CT. No focally suspicious lesions are seen. There is a stable calcified granuloma in the right hepatic dome. The gallbladder is incompletely distended. No evidence of gallstones, gallbladder wall thickening or biliary dilatation. Pancreas: Unremarkable. No pancreatic ductal dilatation or surrounding inflammatory changes. Spleen: Normal in size without focal abnormality. Adrenals/Urinary Tract: The  adrenal glands appear stable without suspicious findings. Both kidneys appear normal. No evidence of urinary tract calculus or hydronephrosis. Stomach/Bowel: No evidence of bowel wall thickening, distention or surrounding inflammatory change. The appendix appears normal. Pelvis not imaged. Vascular/Lymphatic: There are no enlarged abdominal lymph nodes. Mild aortic atherosclerosis. No evidence of large vessel occlusion. Other: No ascites or peritoneal nodularity. Musculoskeletal: No acute or significant osseous findings. IMPRESSION: 1. Further regression of left upper lobe nodule with only linear density remaining, likely post treatment scarring. 2. No evidence of residual adenopathy or new metastatic disease. 3. Heterogeneous hepatic steatosis. Electronically Signed   By: Richardean Sale M.D.   On: 03/27/2016 11:12    ASSESSMENT AND PLAN: This is a very pleasant 60 years old white female with: 1) extensive stage small cell lung cancer status post 6 cycles of systemic chemotherapy with cisplatin and etoposide. The patient related the treatment well except for fatigue. Her recent CT scan of the chest, abdomen and pelvis showed further regression of her disease with remaining linear density likely scarring. There is no clear evidence for metastatic disease or residual lymphadenopathy. I personally reviewed the scan and showed the images to the patient and her family with comparison to the previous imaging studies before start of her treatment. The patient has almost complete response to this treatment. I recommended for her to continue on observation with repeat CT scan of the chest, abdomen and pelvis in 3 months. I also discussed with the patient referral to radiation oncology for consideration of prophylactic cranial irradiation. 2) history of breast cancer. She is currently on observation. 3) chemotherapy-induced anemia: We will continue to monitor this for now. The patient will be considered for PRBCs  transfusion if her hemoglobin is less than 8.0 G/DL. 4) cold symptoms: She is feeling better today. She was advised to use over-the-counter cold medication if needed. 5) hypertension: Her blood pressure is a slightly elevated today. This is most likely related to anxiety about the scan results. She was advised to monitor it closely at home and if elevated to consult with her primary care physician. The patient voices understanding of current disease status and treatment options and is in agreement with the current care plan.  All questions were answered. The patient knows to call the clinic with any problems, questions or concerns. We can certainly see the patient much sooner if necessary.  Disclaimer: This note was dictated with voice recognition software. Similar sounding words can inadvertently be transcribed and may not be corrected upon review.

## 2016-04-05 NOTE — Progress Notes (Signed)
Thoracic Location of Tumor / Histology: Extensive stage (T2b, N2, M1 8) small cell lung cancer presented with large left upper lobe lung mass and mediastinal lymphadenopathy    Patient presented with symptoms of: hoarseness that would not resolve.  Biopsies revealed:   11/10/15 Diagnosis FINE NEEDLE ASPIRATION, EBUS 4L (SPECIMEN 1 OF 2. COLLECTED 11/10/15): MALIGNANT CELLS PRESENT, SEE COMMENT.  Diagnosis FINE NEEDLE ASPIRATION, ENDOSCOPIC SPECIMEN B, EBUS 7 NODE,(SPECIMEN 2 OF 2, COLLECTED ON 11/05/15) MALIGNANT CELLS PRESENT, SEE COMMENT.  Tobacco/Marijuana/Snuff/ETOH use: former smoker. Quit in 2017.  Smoked for 46 years.  Denies ETOH, marijuana and snuff use.   Past/Anticipated interventions by cardiothoracic surgery, if any: Procedure: VIDEO BRONCHOSCOPY WITH ENDOBRONCHIAL ULTRASOUND;  Surgeon: Javier Glazier, MD   Past/Anticipated interventions by medical oncology, if any: Systemic chemotherapy with cisplatin 60 MG/M2 on day 1 and etoposide at 120 MG/M2 on days 1, 2 and 3 with Neulasta support on day 4. She is status post 6 cycles.  Signs/Symptoms  Weight changes, if any: no  Respiratory complaints, if any: yes - reports having a productive cough with green/white sputum  Hemoptysis, if any: no  Pain issues, if any:  no  SAFETY ISSUES:  Prior radiation? Yes - right breast 5040 cGy from 03/15/08- 05/15/08  Pacemaker/ICD? no   Possible current pregnancy?no  Is the patient on methotrexate? no  Current Complaints / other details:  Dr. Julien Nordmann has referred patient for discussion of prophylactic cranial irradiation.     BP (!) 148/78 (BP Location: Right Arm, Patient Position: Sitting)   Pulse 80   Temp 98.4 F (36.9 C) (Oral)   Ht '5\' 6"'$  (1.676 m)   Wt 277 lb 6.4 oz (125.8 kg)   SpO2 99%   BMI 44.77 kg/m    Wt Readings from Last 3 Encounters:  04/07/16 277 lb 6.4 oz (125.8 kg)  03/29/16 271 lb 9.6 oz (123.2 kg)  03/08/16 267 lb 12.8 oz (121.5 kg)

## 2016-04-07 ENCOUNTER — Ambulatory Visit
Admission: RE | Admit: 2016-04-07 | Discharge: 2016-04-07 | Disposition: A | Discharge status: Home / Self Care | Payer: 59 | Source: Ambulatory Visit | Attending: Radiation Oncology | Admitting: Radiation Oncology

## 2016-04-07 ENCOUNTER — Encounter: Payer: Self-pay | Admitting: Radiation Oncology

## 2016-04-07 DIAGNOSIS — Z79899 Other long term (current) drug therapy: Secondary | ICD-10-CM | POA: Diagnosis not present

## 2016-04-07 DIAGNOSIS — Z9889 Other specified postprocedural states: Secondary | ICD-10-CM | POA: Diagnosis not present

## 2016-04-07 DIAGNOSIS — Z87891 Personal history of nicotine dependence: Secondary | ICD-10-CM | POA: Insufficient documentation

## 2016-04-07 DIAGNOSIS — C3492 Malignant neoplasm of unspecified part of left bronchus or lung: Secondary | ICD-10-CM | POA: Diagnosis not present

## 2016-04-07 DIAGNOSIS — Z9221 Personal history of antineoplastic chemotherapy: Secondary | ICD-10-CM | POA: Diagnosis not present

## 2016-04-07 DIAGNOSIS — Z510 Encounter for antineoplastic radiation therapy: Secondary | ICD-10-CM | POA: Diagnosis not present

## 2016-04-07 NOTE — Progress Notes (Signed)
Please see the Nurse Progress Note in the MD Initial Consult Encounter for this patient. 

## 2016-04-07 NOTE — Progress Notes (Signed)
Radiation Oncology         [phone] ________________________________  Initial Outpatient Consultation  Name: Sharon [name] MRN: [id number]  Date: 04/07/2016  DOB: [birth year]  CH:YIFOY,DXAJOI M, PA  Curt Bears, MD   REFERRING PHYSICIAN: Curt Bears, MD  DIAGNOSIS: The encounter diagnosis was Primary small cell carcinoma of left lung ([place]). Extensive stage (T2b, N2, M1 8) small cell lung cancer presented with large left upper lobe mass and mediastinal lymphadenopathy  HISTORY OF PRESENT ILLNESS::Sharon [name] is a 60 y.o. female who had a history of unresolved hoarseness as well as shortness of breath. She was treated with several courses of antibiotics with no improvement. CT scan of the chest on 11/04/15 revealed infiltrative mass in the region of the left hilum, left mediastinum, and left upper lobe measuring approximately 5.6 x 6.4 cm.  Fine needle aspiration of ebus 4L on  11/10/15 revealed malignant cells. Fine needle aspiration of endoscopic specimen B also revealed malignant cells.  Patient underwent systemic chemotherapy with cisplatin 60 mg/m2 on day 1, and etoposide at 120 mg/m2 on days 1, 2, and 3 with Neulasta support on day 4. Patient is status post 6 cycles.  Patient is positive for a productive cough with green/white sputum. Patient denies weight changes, numbness of tongue, difficulty eating, numbness in legs, difficulty walking, hemoptysis, or pain at this time.  No new headaches or double vision  PREVIOUS RADIATION THERAPY: Yes; 7 years 11 months 03/15/08-05/15/08 54 Gy to the right breast  PAST MEDICAL HISTORY:  has a past medical history of Cancer of right breast ([place]) (2009); CAP (community acquired pneumonia) (11/06/2015); COPD (chronic obstructive pulmonary disease) ([place]); Elevated blood pressure; Encounter for antineoplastic chemotherapy (01/05/2016); Hoarseness of voice; Lung mass (dx'd 10/2015); Menopause; Personal history of breast cancer (03/29/2016);  Pre-diabetes; Small cell lung cancer ([place]); and Vitamin D deficiency.    PAST SURGICAL HISTORY: Past Surgical History:  Procedure Laterality Date  . BREAST BIOPSY Right 2009  . BREAST LUMPECTOMY Right 2009  . UTERINE FIBROID SURGERY  2000  . VAGINAL HYSTERECTOMY  2000  . VESICOVAGINAL FISTULA CLOSURE W/ TAH  2000  . VIDEO BRONCHOSCOPY WITH ENDOBRONCHIAL ULTRASOUND N/A 11/10/2015   Procedure: VIDEO BRONCHOSCOPY WITH ENDOBRONCHIAL ULTRASOUND;  Surgeon: Javier Glazier, MD;  Location: [facility];  Service: Thoracic;  Laterality: N/A;    FAMILY HISTORY: family history is not on file. She was adopted.  SOCIAL HISTORY:  reports that she quit smoking about 4 months ago. Her smoking use included Cigarettes. She has a 4.60 pack-year smoking history. She has never used smokeless tobacco. She reports that she uses drugs, including Marijuana. She reports that she does not drink alcohol.  ALLERGIES: Patient has no known allergies.  MEDICATIONS:  Current Outpatient Prescriptions  Medication Sig Dispense Refill  . albuterol (PROVENTIL HFA;VENTOLIN HFA) 108 (90 Base) MCG/ACT inhaler Inhale 1 puff into the lungs every 6 (six) hours as needed for wheezing or shortness of breath.    . budesonide-formoterol (SYMBICORT) 160-4.5 MCG/ACT inhaler Inhale 2 puffs into the lungs 2 (two) times daily. 1 Inhaler 12  . montelukast (SINGULAIR) 10 MG tablet Take 10 mg by mouth daily as needed.     . prochlorperazine (COMPAZINE) 10 MG tablet TAKE 1 TABLET BY MOUTH EVERY 6 HOURS AS NEEDED FOR NAUSEA AND VOMITING 360 tablet 1  . fluticasone (FLONASE) 50 MCG/ACT nasal spray Place 2 sprays into both nostrils daily as needed.      No current facility-administered medications for this encounter.  REVIEW OF SYSTEMS:  A 15 point review of systems is documented in the electronic medical record. This was obtained by the nursing staff. However, I reviewed this with the patient to discuss relevant findings and make appropriate  changes.     PHYSICAL EXAM:  height is '5\' 6"'$  (1.676 m) and weight is 277 lb 6.4 oz (125.8 kg). Her oral temperature is 98.4 F (36.9 C). Her blood pressure is 148/78 (abnormal) and her pulse is 80. Her oxygen saturation is 99%.   General: Alert and oriented, in no acute distress HEENT: Head is normocephalic. Extraocular movements are intact. Oropharynx is clear. Neck: Neck is supple, no palpable cervical or supraclavicular lymphadenopathy. Heart: Regular in rate and rhythm with no murmurs, rubs, or gallops. Chest: Clear to auscultation bilaterally, with no rhonchi, wheezes, or rales. Abdomen: Soft, nontender, nondistended, with no rigidity or guarding. Extremities: No cyanosis or edema. Lymphatics: see Neck Exam Skin: No concerning lesions. Musculoskeletal: symmetric strength and muscle tone throughout. Neurologic: Cranial nerves II through XII are grossly intact. No obvious focalities. Speech is fluent. Coordination is intact. Psychiatric: Judgment and insight are intact. Affect is appropriate.  ECOG = 1  LABORATORY DATA:  Lab Results  Component Value Date   WBC 8.7 03/29/2016   HGB 10.6 (L) 03/29/2016   HCT 31.8 (L) 03/29/2016   MCV 103.2 (H) 03/29/2016   PLT 222 03/29/2016   NEUTROABS 6.6 (H) 03/29/2016   Lab Results  Component Value Date   NA 138 03/29/2016   K 4.0 03/29/2016   CL 102 11/07/2015   CO2 24 03/29/2016   GLUCOSE 140 03/29/2016   CREATININE 0.8 03/29/2016   CALCIUM 9.4 03/29/2016      RADIOGRAPHY: Ct Chest W Contrast  Result Date: 03/27/2016 CLINICAL DATA:  Restaging lung cancer diagnosed in July 2017. Remote history of right breast cancer. Chemotherapy completed. EXAM: CT CHEST AND ABDOMEN WITH CONTRAST TECHNIQUE: Multidetector CT imaging of the chest and abdomen was performed following the standard protocol during bolus administration of intravenous contrast. CONTRAST:  100 ml Isovue-300. COMPARISON:  Chest CT 01/07/2016.  PET-CT 11/19/2015. FINDINGS: CT  CHEST FINDINGS Cardiovascular: There are no significant vascular findings. The heart size is normal. There is no pericardial effusion. Mediastinum/Nodes: There are no enlarged mediastinal, hilar or axillary lymph nodes. The thyroid gland, trachea and esophagus demonstrate no significant findings. Lungs/Pleura: There is no pleural effusion. Further regression in spiculated left upper lobe nodule, now largely linear and measuring 1.6 x 0.6 cm on image 47. No new or enlarging pulmonary nodules are seen. There is no residual airspace disease or endobronchial abnormality. Musculoskeletal/Chest wall: No chest wall mass or suspicious osseous findings. Grossly stable thoracic spine degenerative changes. CT ABDOMEN FINDINGS Hepatobiliary: The liver appears grossly stable with heterogeneous steatosis. There are areas of focal sparing which are similar to previous PET-CT. No focally suspicious lesions are seen. There is a stable calcified granuloma in the right hepatic dome. The gallbladder is incompletely distended. No evidence of gallstones, gallbladder wall thickening or biliary dilatation. Pancreas: Unremarkable. No pancreatic ductal dilatation or surrounding inflammatory changes. Spleen: Normal in size without focal abnormality. Adrenals/Urinary Tract: The adrenal glands appear stable without suspicious findings. Both kidneys appear normal. No evidence of urinary tract calculus or hydronephrosis. Stomach/Bowel: No evidence of bowel wall thickening, distention or surrounding inflammatory change. The appendix appears normal. Pelvis not imaged. Vascular/Lymphatic: There are no enlarged abdominal lymph nodes. Mild aortic atherosclerosis. No evidence of large vessel occlusion. Other: No ascites or peritoneal nodularity. Musculoskeletal:  No acute or significant osseous findings. IMPRESSION: 1. Further regression of left upper lobe nodule with only linear density remaining, likely post treatment scarring. 2. No evidence of  residual adenopathy or new metastatic disease. 3. Heterogeneous hepatic steatosis. Electronically Signed   By: Richardean Sale M.D.   On: 03/27/2016 11:12   Ct Abdomen W Contrast  Result Date: 03/27/2016 CLINICAL DATA:  Restaging lung cancer diagnosed in July 2017. Remote history of right breast cancer. Chemotherapy completed. EXAM: CT CHEST AND ABDOMEN WITH CONTRAST TECHNIQUE: Multidetector CT imaging of the chest and abdomen was performed following the standard protocol during bolus administration of intravenous contrast. CONTRAST:  100 ml Isovue-300. COMPARISON:  Chest CT 01/07/2016.  PET-CT 11/19/2015. FINDINGS: CT CHEST FINDINGS Cardiovascular: There are no significant vascular findings. The heart size is normal. There is no pericardial effusion. Mediastinum/Nodes: There are no enlarged mediastinal, hilar or axillary lymph nodes. The thyroid gland, trachea and esophagus demonstrate no significant findings. Lungs/Pleura: There is no pleural effusion. Further regression in spiculated left upper lobe nodule, now largely linear and measuring 1.6 x 0.6 cm on image 47. No new or enlarging pulmonary nodules are seen. There is no residual airspace disease or endobronchial abnormality. Musculoskeletal/Chest wall: No chest wall mass or suspicious osseous findings. Grossly stable thoracic spine degenerative changes. CT ABDOMEN FINDINGS Hepatobiliary: The liver appears grossly stable with heterogeneous steatosis. There are areas of focal sparing which are similar to previous PET-CT. No focally suspicious lesions are seen. There is a stable calcified granuloma in the right hepatic dome. The gallbladder is incompletely distended. No evidence of gallstones, gallbladder wall thickening or biliary dilatation. Pancreas: Unremarkable. No pancreatic ductal dilatation or surrounding inflammatory changes. Spleen: Normal in size without focal abnormality. Adrenals/Urinary Tract: The adrenal glands appear stable without suspicious  findings. Both kidneys appear normal. No evidence of urinary tract calculus or hydronephrosis. Stomach/Bowel: No evidence of bowel wall thickening, distention or surrounding inflammatory change. The appendix appears normal. Pelvis not imaged. Vascular/Lymphatic: There are no enlarged abdominal lymph nodes. Mild aortic atherosclerosis. No evidence of large vessel occlusion. Other: No ascites or peritoneal nodularity. Musculoskeletal: No acute or significant osseous findings. IMPRESSION: 1. Further regression of left upper lobe nodule with only linear density remaining, likely post treatment scarring. 2. No evidence of residual adenopathy or new metastatic disease. 3. Heterogeneous hepatic steatosis. Electronically Signed   By: Richardean Sale M.D.   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 Patient has had an excellent response to her systemic chemotherapy. She would be at risk for relapse within the brain. She would likely benefit from prophylactic cranial radiation. The patient has not had an MRI of the brain since July 2017. I recommend that she proceed with another MRI. Patient agrees with this recommendation. We will get this set up in the near future to rule out the asymptomatic brain metastasis as this would affect her overall treatment to her brain. The patient appears receptive to proceeding with prophylactic cranial radiation. She understands the potential side effects, and long term effects of the treatment and agrees to undergo treatment.  PLAN: MRI of the brain in the near future.     ------------------------------------------------  Blair Promise, PhD, MD  This document serves as a record of services personally performed by Gery Pray, MD. It was created on his behalf by Bethann Humble, a trained medical scribe. The creation  of this record is based on the scribe's  personal observations and the provider's statements to them. This document has been checked and approved by the attending provider.

## 2016-04-09 ENCOUNTER — Encounter: Payer: Self-pay | Admitting: *Deleted

## 2016-04-09 ENCOUNTER — Telehealth: Payer: Self-pay | Admitting: *Deleted

## 2016-04-09 NOTE — Telephone Encounter (Signed)
Called patient to inform of MRI for 04-11-16 - arrival time - 4:45 pm @ [facility], no restrictions to test, spoke with patient and she is aware of this test

## 2016-04-11 ENCOUNTER — Ambulatory Visit (HOSPITAL_COMMUNITY)
Admission: RE | Admit: 2016-04-11 | Discharge: 2016-04-11 | Disposition: A | Discharge status: Home / Self Care | Payer: 59 | Source: Ambulatory Visit | Attending: Radiation Oncology | Admitting: Radiation Oncology

## 2016-04-11 ENCOUNTER — Ambulatory Visit (HOSPITAL_COMMUNITY): Payer: 59

## 2016-04-11 DIAGNOSIS — C3492 Malignant neoplasm of unspecified part of left bronchus or lung: Secondary | ICD-10-CM | POA: Diagnosis not present

## 2016-04-11 DIAGNOSIS — I679 Cerebrovascular disease, unspecified: Secondary | ICD-10-CM | POA: Insufficient documentation

## 2016-04-11 MED ORDER — GADOBENATE DIMEGLUMINE 529 MG/ML IV SOLN
20.0000 mL | Freq: Once | INTRAVENOUS | Status: AC | PRN
  Administered 2016-04-11: 20 mL via INTRAVENOUS

## 2016-04-13 ENCOUNTER — Telehealth: Payer: Self-pay | Admitting: Oncology

## 2016-04-13 NOTE — Telephone Encounter (Signed)
Left a message for Tmya to see if she would like to have radiation treatment.  Requested a return call.

## 2016-04-13 NOTE — Telephone Encounter (Signed)
Sharon [name] called back and said she would like to have radiation but she does not want to start until after Christmas.  Advised her that we will call her with a simulation appointment for the week after Christmas.

## 2016-04-14 NOTE — Telephone Encounter (Signed)
Liesel called back and said she would like to have her CT SIM the week of January 1.  Advised her that Dr. Sondra Come will be advised and we will call her with an appointment time.

## 2016-04-27 ENCOUNTER — Ambulatory Visit
Admission: RE | Admit: 2016-04-27 | Discharge: 2016-04-27 | Disposition: A | Discharge status: Home / Self Care | Payer: 59 | Source: Ambulatory Visit | Attending: Radiation Oncology | Admitting: Radiation Oncology

## 2016-04-27 DIAGNOSIS — C3492 Malignant neoplasm of unspecified part of left bronchus or lung: Secondary | ICD-10-CM

## 2016-04-27 DIAGNOSIS — Z510 Encounter for antineoplastic radiation therapy: Secondary | ICD-10-CM | POA: Diagnosis not present

## 2016-04-27 NOTE — Progress Notes (Signed)
  Radiation Oncology         (336) [phone] ________________________________  Name: Sharon [name] MRN: [id number]  Date: 04/27/2016  DOB: [birth year]  SIMULATION AND TREATMENT PLANNING NOTE    ICD-9-CM ICD-10-CM   1. Primary small cell carcinoma of left lung ([name]) 162.9 C34.92     DIAGNOSIS: Extensive stage (T2b, N2, M1) small cell lung cancer presented with large left upper lobe mass and mediastinal lymphadenopathy  NARRATIVE:  The patient was brought to the [facility].  Identity was confirmed.  All relevant records and images related to the planned course of therapy were reviewed.  The patient freely provided informed written consent to proceed with treatment after reviewing the details related to the planned course of therapy. The consent form was witnessed and verified by the simulation staff.  Then, the patient was set-up in a stable reproducible  supine position for radiation therapy.  CT images were obtained.  Surface markings were placed.  The CT images were loaded into the planning software.  Then the target and avoidance structures were contoured.  Treatment planning then occurred.  The radiation prescription was entered and confirmed.  Then, I designed and supervised the construction of a total of 3 medically necessary complex treatment devices.  I have requested : Isodose Plan.  I have ordered: dose calc.  PLAN:  The patient will receive 25 Gy in 10 fractions to the whole brain for prophylactic treatment.  -----------------------------------  Blair Promise, PhD, MD  This document serves as a record of services personally performed by Gery Pray, MD. It was created on his behalf by Darcus Austin, a trained medical scribe. The creation of this record is based on the scribe's personal observations and the provider's statements to them. This document has been checked and approved by the attending provider.

## 2016-04-29 DIAGNOSIS — Z510 Encounter for antineoplastic radiation therapy: Secondary | ICD-10-CM | POA: Diagnosis not present

## 2016-05-04 ENCOUNTER — Encounter: Payer: Self-pay | Admitting: Radiation Oncology

## 2016-05-04 ENCOUNTER — Ambulatory Visit
Admission: RE | Admit: 2016-05-04 | Discharge: 2016-05-04 | Disposition: A | Discharge status: Home / Self Care | Payer: 59 | Source: Ambulatory Visit | Attending: Radiation Oncology | Admitting: Radiation Oncology

## 2016-05-04 DIAGNOSIS — Z510 Encounter for antineoplastic radiation therapy: Secondary | ICD-10-CM | POA: Diagnosis not present

## 2016-05-04 DIAGNOSIS — C3492 Malignant neoplasm of unspecified part of left bronchus or lung: Secondary | ICD-10-CM

## 2016-05-04 NOTE — Progress Notes (Signed)
  Radiation Oncology         [phone]) [phone] ________________________________  Name: Sharon [name] MRN: [id number]  Date: 05/04/2016  DOB: 1956/02/13  Simulation Verification Note    ICD-9-CM ICD-10-CM   1. Primary small cell carcinoma of left lung ([name]) 162.9 C34.92     Status: outpatient  NARRATIVE: The patient was brought to the treatment unit and placed in the planned treatment position. The clinical setup was verified. Then port films were obtained and uploaded to the radiation oncology medical record software.  The treatment beams were carefully compared against the planned radiation fields. The position location and shape of the radiation fields was reviewed. They targeted volume of tissue appears to be appropriately covered by the radiation beams. Organs at risk appear to be excluded as planned.  Based on my personal review, I approved the simulation verification. The patient's treatment will proceed as planned.  -----------------------------------  Blair Promise, PhD, MD

## 2016-05-05 ENCOUNTER — Ambulatory Visit
Admission: RE | Admit: 2016-05-05 | Discharge: 2016-05-05 | Disposition: A | Discharge status: Home / Self Care | Payer: 59 | Source: Ambulatory Visit | Attending: Radiation Oncology | Admitting: Radiation Oncology

## 2016-05-05 DIAGNOSIS — Z510 Encounter for antineoplastic radiation therapy: Secondary | ICD-10-CM | POA: Diagnosis not present

## 2016-05-06 ENCOUNTER — Ambulatory Visit
Admission: RE | Admit: 2016-05-06 | Discharge: 2016-05-06 | Disposition: A | Discharge status: Home / Self Care | Payer: 59 | Source: Ambulatory Visit | Attending: Radiation Oncology | Admitting: Radiation Oncology

## 2016-05-06 DIAGNOSIS — Z510 Encounter for antineoplastic radiation therapy: Secondary | ICD-10-CM | POA: Diagnosis not present

## 2016-05-07 ENCOUNTER — Ambulatory Visit
Admission: RE | Admit: 2016-05-07 | Discharge: 2016-05-07 | Disposition: A | Discharge status: Home / Self Care | Payer: 59 | Source: Ambulatory Visit | Attending: Radiation Oncology | Admitting: Radiation Oncology

## 2016-05-07 DIAGNOSIS — Z510 Encounter for antineoplastic radiation therapy: Secondary | ICD-10-CM | POA: Diagnosis not present

## 2016-05-10 ENCOUNTER — Encounter: Payer: Self-pay | Admitting: Radiation Oncology

## 2016-05-10 ENCOUNTER — Ambulatory Visit
Admission: RE | Admit: 2016-05-10 | Discharge: 2016-05-10 | Disposition: A | Discharge status: Home / Self Care | Payer: 59 | Source: Ambulatory Visit | Attending: Radiation Oncology | Admitting: Radiation Oncology

## 2016-05-10 VITALS — BP 116/62 | HR 75 | Temp 98.2°F | Ht 66.0 in | Wt 270.2 lb

## 2016-05-10 DIAGNOSIS — Z510 Encounter for antineoplastic radiation therapy: Secondary | ICD-10-CM | POA: Diagnosis not present

## 2016-05-10 DIAGNOSIS — C3492 Malignant neoplasm of unspecified part of left bronchus or lung: Secondary | ICD-10-CM

## 2016-05-10 MED ORDER — BIAFINE EX EMUL
Freq: Once | CUTANEOUS | Status: AC
  Administered 2016-05-10: 15:00:00 via TOPICAL

## 2016-05-10 NOTE — Progress Notes (Signed)
  Radiation Oncology         [phone] ________________________________  Name: Sharon [name] MRN: [id number]  Date: 05/10/2016  DOB: 12/24/1955  Weekly Radiation Therapy Management    ICD-9-CM ICD-10-CM   1. Primary small cell carcinoma of left lung ([name]) 162.9 C34.92      Current Dose: 12.5 Gy     Planned Dose:  25 Gy  Narrative . . . . . . . . The patient presents for routine under treatment assessment.                                   Sharon [name] has completed 5 fractions to her brain. She denies pain. She does report having nausea and has been taking compazine.  She reports having occasional twinges of headaches. She reports having fatigue.  Her skin is intact on her forehead and scalp. The patient notes some hair loss.                                  Set-up films were reviewed.                                 The chart was checked. Physical Findings. . .  height is '5\' 6"'$  (1.676 m) and weight is 270 lb 3.2 oz (122.6 kg). Her oral temperature is 98.2 F (36.8 C). Her blood pressure is 116/62 and her pulse is 75. Her oxygen saturation is 99%.   Oropharynx clear. She has alopecia from her chemotherapy. Impression . . . . . . . The patient is tolerating radiation. Plan . . . . . . . . . . . . Continue treatment as planned. ________________________________   Blair Promise, PhD, MD  This document serves as a record of services personally performed by Gery Pray, MD. It was created on his behalf by Darcus Austin, a trained medical scribe. The creation of this record is based on the scribe's personal observations and the provider's statements to them. This document has been checked and approved by the attending provider.

## 2016-05-10 NOTE — Progress Notes (Signed)
Sharon [name] has completed 5 fractions to her brain.  She denies having pain.  She does report having nausea and has been taking compazine.  She reports having occasional twinges of headaches.  She reports having fatigue.  Her skin is intact on her forehead and scalp.  BP 116/62 (BP Location: Left Arm, Patient Position: Sitting)   Pulse 75   Temp 98.2 F (36.8 C) (Oral)   Ht '5\' 6"'$  (1.676 m)   Wt 270 lb 3.2 oz (122.6 kg)   SpO2 99%   BMI 43.61 kg/m    Wt Readings from Last 3 Encounters:  05/10/16 270 lb 3.2 oz (122.6 kg)  04/07/16 277 lb 6.4 oz (125.8 kg)  03/29/16 271 lb 9.6 oz (123.2 kg)

## 2016-05-10 NOTE — Progress Notes (Signed)
Pt here for patient teaching.  Pt given Radiation and You booklet and biafine. Pt reports they have not watched the Radiation Therapy Education video and has been given the link to watch at home.  Reviewed areas of pertinence such as fatigue, hair loss, nausea and vomiting, skin changes, headache and earaches . Pt able to give teach back of to apply biafine BID, to pat skin and use unscented/gentle soap,avoid applying anything to skin within 4 hours of treatment. Pt demonstrated understanding and verbalizes understanding of information given and will contact nursing with any questions or concerns.

## 2016-05-11 ENCOUNTER — Ambulatory Visit: Payer: 59 | Admitting: Radiation Oncology

## 2016-05-11 ENCOUNTER — Ambulatory Visit
Admission: RE | Admit: 2016-05-11 | Discharge: 2016-05-11 | Disposition: A | Discharge status: Home / Self Care | Payer: 59 | Source: Ambulatory Visit | Attending: Radiation Oncology | Admitting: Radiation Oncology

## 2016-05-11 DIAGNOSIS — Z510 Encounter for antineoplastic radiation therapy: Secondary | ICD-10-CM | POA: Diagnosis not present

## 2016-05-12 ENCOUNTER — Ambulatory Visit: Payer: 59

## 2016-05-13 ENCOUNTER — Ambulatory Visit
Admission: RE | Admit: 2016-05-13 | Discharge: 2016-05-13 | Disposition: A | Discharge status: Home / Self Care | Payer: 59 | Source: Ambulatory Visit | Attending: Radiation Oncology | Admitting: Radiation Oncology

## 2016-05-13 ENCOUNTER — Encounter: Payer: Self-pay | Admitting: Radiation Oncology

## 2016-05-13 ENCOUNTER — Encounter: Payer: Self-pay | Admitting: Oncology

## 2016-05-13 VITALS — BP 143/78 | HR 78 | Temp 97.6°F | Ht 66.0 in | Wt 268.6 lb

## 2016-05-13 DIAGNOSIS — Z510 Encounter for antineoplastic radiation therapy: Secondary | ICD-10-CM | POA: Diagnosis not present

## 2016-05-13 DIAGNOSIS — C3492 Malignant neoplasm of unspecified part of left bronchus or lung: Secondary | ICD-10-CM

## 2016-05-13 NOTE — Progress Notes (Signed)
  Radiation Oncology         [phone] ________________________________  Name: JAZLEN OGARRO MRN: [id number]  Date: 05/13/2016  DOB: [birth year]  Weekly Radiation Therapy Management    ICD-9-CM ICD-10-CM   1. Primary small cell carcinoma of left lung ([name]) 162.9 C34.92      Current Dose: 17.5 Gy     Planned Dose:  25 Gy  Narrative . . . . . . . . The patient presents for routine under treatment assessment.                                   The patient is Is having some nausea but is controlled well Compazine. She has mild headaches but does not wish to start on steroids                                 Set-up films were reviewed.                                 The chart was checked. Physical Findings. . .  height is '5\' 6"'$  (1.676 m) and weight is 268 lb 9.6 oz (121.8 kg). Her oral temperature is 97.6 F (36.4 C). Her blood pressure is 143/78 (abnormal) and her pulse is 78. Her oxygen saturation is 99%. . The scalp area shows mild erythema. The oral cavity is free of secondary infection. The lungs are clear. The heart has a regular rhythm and rate.  Impression . . . . . . . The patient is tolerating radiation. Plan . . . . . . . . . . . . Continue treatment as planned.  ________________________________   Blair Promise, PhD, MD

## 2016-05-13 NOTE — Progress Notes (Addendum)
Rian Gwin has completed 7 fractions to her brain.  She denies having pain, headaches or vision changes.  She continues to have nausea but reports it is better since she has been taking compazine before treatment.  She reports having fatigue.  The skin on her forehead and scalp and is intact.  She would like to return to work on 05/27/16 and needs a letter to bring with her.   BP (!) 143/78 (BP Location: Left Wrist, Patient Position: Sitting)   Pulse 78   Temp 97.6 F (36.4 C) (Oral)   Ht '5\' 6"'$  (1.676 m)   Wt 268 lb 9.6 oz (121.8 kg)   SpO2 99%   BMI 43.35 kg/m    Wt Readings from Last 3 Encounters:  05/13/16 268 lb 9.6 oz (121.8 kg)  05/10/16 270 lb 3.2 oz (122.6 kg)  04/07/16 277 lb 6.4 oz (125.8 kg)

## 2016-05-14 ENCOUNTER — Ambulatory Visit
Admission: RE | Admit: 2016-05-14 | Discharge: 2016-05-14 | Disposition: A | Discharge status: Home / Self Care | Payer: 59 | Source: Ambulatory Visit | Attending: Radiation Oncology | Admitting: Radiation Oncology

## 2016-05-14 DIAGNOSIS — Z510 Encounter for antineoplastic radiation therapy: Secondary | ICD-10-CM | POA: Diagnosis not present

## 2016-05-17 ENCOUNTER — Ambulatory Visit
Admission: RE | Admit: 2016-05-17 | Discharge: 2016-05-17 | Disposition: A | Discharge status: Home / Self Care | Payer: 59 | Source: Ambulatory Visit | Attending: Radiation Oncology | Admitting: Radiation Oncology

## 2016-05-17 ENCOUNTER — Ambulatory Visit: Payer: 59

## 2016-05-17 DIAGNOSIS — Z510 Encounter for antineoplastic radiation therapy: Secondary | ICD-10-CM | POA: Diagnosis not present

## 2016-05-18 ENCOUNTER — Ambulatory Visit
Admission: RE | Admit: 2016-05-18 | Discharge: 2016-05-18 | Disposition: A | Discharge status: Home / Self Care | Payer: 59 | Source: Ambulatory Visit | Attending: Radiation Oncology | Admitting: Radiation Oncology

## 2016-05-18 ENCOUNTER — Ambulatory Visit: Payer: 59

## 2016-05-18 ENCOUNTER — Encounter: Payer: Self-pay | Admitting: Radiation Oncology

## 2016-05-18 VITALS — BP 121/66 | HR 84 | Temp 97.8°F | Resp 20 | Ht 66.0 in | Wt 266.8 lb

## 2016-05-18 DIAGNOSIS — Z510 Encounter for antineoplastic radiation therapy: Secondary | ICD-10-CM | POA: Diagnosis not present

## 2016-05-18 DIAGNOSIS — C3492 Malignant neoplasm of unspecified part of left bronchus or lung: Secondary | ICD-10-CM

## 2016-05-18 NOTE — Progress Notes (Signed)
Sharon [name] has completed 10 fractions to her brain.  She denies having pain, headaches or vision changes.  She continues to have nausea but reports it is better since she has been taking compazine before treatment.  She reports having fatigue ongoing.  The skin on her forehead and scalp are intact intact.  Dry skin noted on right ear lobe, will use biafine.  EOT instructions will use biafine to right ear lobe and forehead.  One month follow up card given to see Dr. Sondra Come.  Medical Oncology office will contact for follow up appointment. Wt Readings from Last 3 Encounters:  05/18/16 266 lb 12.8 oz (121 kg)  05/13/16 268 lb 9.6 oz (121.8 kg)  05/10/16 270 lb 3.2 oz (122.6 kg)  BP 121/66   Pulse 84   Temp 97.8 F (36.6 C) (Oral)   Resp 20   Ht '5\' 6"'$  (1.676 m)   Wt 266 lb 12.8 oz (121 kg)   SpO2 95%   BMI 43.06 kg/m

## 2016-05-18 NOTE — Progress Notes (Signed)
  Radiation Oncology         [phone] ________________________________  Name: Sharon [name] MRN: [id number]  Date: 05/18/2016  DOB: Mar 10, 1956  Weekly Radiation Therapy Management    ICD-9-CM ICD-10-CM   1. Primary small cell carcinoma of left lung ([name]) 162.9 C34.92      Current Dose: 25 Gy     Planned Dose:  25 Gy  Narrative . . . . . . . . The patient presents for routine under treatment assessment.                                    Sharon [name] has completed 10 fractions to her brain. She denies having pain, headaches, or vision changes. She continues to have nausea, but reports it is better since she has been taking compazine before treatment. She reports having ongoing fatigue. The skin on her forehead and scalp are intact. Dry skin noted on right ear lobe, will use biafine.  One month follow up card given to see Dr. Sondra Come.  Medical Oncology office will contact for follow up appointment.                                  Set-up films were reviewed.                                 The chart was checked. Physical Findings. . .  height is '5\' 6"'$  (1.676 m) and weight is 266 lb 12.8 oz (121 kg). Her oral temperature is 97.8 F (36.6 C). Her blood pressure is 121/66 and her pulse is 84. Her respiration is 20 and oxygen saturation is 95%. . The scalp area shows mild erythema. The oral cavity is free of secondary infection. The lungs are clear. The heart has a regular rhythm and rate. Dryness to the right helix.   Impression . . . . . . . The patient has tolerated radiation. Plan . . . . . . . . . . . . She completed treatment as planned. The patient was given a 1 month follow up note. ________________________________   Blair Promise, PhD, MD  This document serves as a record of services personally performed by Gery Pray, MD. It was created on his behalf by Darcus Austin, a trained medical scribe. The creation of this record is based on the scribe's personal observations and the  provider's statements to them. This document has been checked and approved by the attending provider.

## 2016-05-19 ENCOUNTER — Ambulatory Visit: Payer: 59

## 2016-05-20 NOTE — Progress Notes (Signed)
  Radiation Oncology         [phone] ________________________________  Name: Sharon [name] MRN: [id number]  Date: 05/18/2016  DOB: [birth year]  End of Treatment Note  Diagnosis:   Extensive stage (T2b, N2, M1) small cell lung cancer presented with large left upper lobe mass and mediastinal lymphadenopathy     Indication for treatment:  Prophylactic cranial irradiation  Radiation treatment dates:   05/04/16 - 05/18/16  Site/dose:  Whole brain: 25 Gy in 10 fractions.  Beams/energy:   Isodose Plan // 6X Photon  Narrative: The patient tolerated radiation treatment relatively well. The patient denied pain or vision changes. The patient had mild headaches during treatment, but did not wish to start on steroids. The patient continued to have nausea for which she took compazine. At the end of treatment, the patient's scalp was notable for mild erythema.  Plan: The patient has completed radiation treatment. The patient will return to radiation oncology clinic for routine followup in one month. I advised them to call or return sooner if they have any questions or concerns related to their recovery or treatment.  -----------------------------------  Blair Promise, PhD, MD  This document serves as a record of services personally performed by Gery Pray, MD. It was created on his behalf by Darcus Austin, a trained medical scribe. The creation of this record is based on the scribe's personal observations and the provider's statements to them. This document has been checked and approved by the attending provider.

## 2016-06-04 ENCOUNTER — Telehealth: Payer: Self-pay | Admitting: *Deleted

## 2016-06-04 NOTE — Telephone Encounter (Signed)
Message from pt reporting dizziness "trouble" with her ear and nausea. Recently completed radiation to brain. Stated "this is not good while I'm driving."  Returned call to pt: DO NOT DRIVE. Pt reports little warning before vomiting. Pt returned to work on 05/27/16. Reports nausea/ vomiting and diarrhea began on 2/5. Vomiting returned on 2/8. Today the earth seemed to be spinning while driving home. Pt reports a "whooshing noise" in her ear.  She has avoided taking Compazine because she vomited shortly after taking it earlier this week. Instructed her to take a dose of Compazine.  Pt wonders if it is too soon for her to have returned to work. 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  Discussed with Ashlyn, Radiation Oncology PA: (Dr. Sondra Come out of office) This does not seem related to radiation treatment. Pt to follow up with PCP or go to ED for evaluation. May have picked up a bug since returning to work. Returned call to pt with instructions to contact PCP or go to ED for evaluation. She voiced understanding.

## 2016-06-05 ENCOUNTER — Emergency Department (HOSPITAL_COMMUNITY)
Admission: EM | Admit: 2016-06-05 | Discharge: 2016-06-05 | Disposition: A | Discharge status: Home / Self Care | Payer: 59 | Attending: Emergency Medicine | Admitting: Emergency Medicine

## 2016-06-05 ENCOUNTER — Encounter (HOSPITAL_COMMUNITY): Payer: Self-pay | Admitting: Emergency Medicine

## 2016-06-05 DIAGNOSIS — Z87891 Personal history of nicotine dependence: Secondary | ICD-10-CM | POA: Insufficient documentation

## 2016-06-05 DIAGNOSIS — J449 Chronic obstructive pulmonary disease, unspecified: Secondary | ICD-10-CM | POA: Diagnosis not present

## 2016-06-05 DIAGNOSIS — R112 Nausea with vomiting, unspecified: Secondary | ICD-10-CM | POA: Diagnosis present

## 2016-06-05 DIAGNOSIS — Z85118 Personal history of other malignant neoplasm of bronchus and lung: Secondary | ICD-10-CM | POA: Diagnosis not present

## 2016-06-05 DIAGNOSIS — Z853 Personal history of malignant neoplasm of breast: Secondary | ICD-10-CM | POA: Diagnosis not present

## 2016-06-05 DIAGNOSIS — R42 Dizziness and giddiness: Secondary | ICD-10-CM | POA: Diagnosis not present

## 2016-06-05 LAB — CBC
HCT: 39.1 % (ref 36.0–46.0)
Hemoglobin: 13.2 g/dL (ref 12.0–15.0)
MCH: 32.9 pg (ref 26.0–34.0)
MCHC: 33.8 g/dL (ref 30.0–36.0)
MCV: 97.5 fL (ref 78.0–100.0)
Platelets: 280 10*3/uL (ref 150–400)
RBC: 4.01 MIL/uL (ref 3.87–5.11)
RDW: 13.1 % (ref 11.5–15.5)
WBC: 9 10*3/uL (ref 4.0–10.5)

## 2016-06-05 LAB — COMPREHENSIVE METABOLIC PANEL
ALT: 41 U/L (ref 14–54)
AST: 32 U/L (ref 15–41)
Albumin: 3.9 g/dL (ref 3.5–5.0)
Alkaline Phosphatase: 75 U/L (ref 38–126)
Anion gap: 8 (ref 5–15)
BUN: 12 mg/dL (ref 6–20)
CO2: 29 mmol/L (ref 22–32)
Calcium: 9.4 mg/dL (ref 8.9–10.3)
Chloride: 102 mmol/L (ref 101–111)
Creatinine, Ser: 0.85 mg/dL (ref 0.44–1.00)
GFR calc Af Amer: >60 mL/min (ref >60)
GFR calc non Af Amer: >60 mL/min (ref >60)
Glucose, Bld: 101 mg/dL — ABNORMAL HIGH (ref 65–99)
Potassium: 3.8 mmol/L (ref 3.5–5.1)
Sodium: 139 mmol/L (ref 135–145)
Total Bilirubin: 0.4 mg/dL (ref 0.3–1.2)
Total Protein: 7.2 g/dL (ref 6.5–8.1)

## 2016-06-05 LAB — LIPASE, BLOOD: Lipase: 12 U/L (ref 11–51)

## 2016-06-05 MED ORDER — ONDANSETRON 4 MG PO TBDP
4.0000 mg | ORAL_TABLET | Freq: Once | ORAL | Status: AC | PRN
  Administered 2016-06-05: 4 mg via ORAL
  Filled 2016-06-05: qty 1

## 2016-06-05 MED ORDER — PROMETHAZINE HCL 25 MG PO TABS: 25.0000 mg | ORAL_TABLET | Freq: Three times a day (TID) | ORAL | 0 refills | Status: DC | PRN

## 2016-06-05 MED ORDER — MECLIZINE HCL 12.5 MG PO TABS: 12.5000 mg | ORAL_TABLET | Freq: Three times a day (TID) | ORAL | 0 refills | Status: DC | PRN

## 2016-06-05 MED ORDER — MECLIZINE HCL 12.5 MG PO TABS: 12.5000 mg | ORAL_TABLET | Freq: Three times a day (TID) | ORAL | 0 refills | Status: AC | PRN

## 2016-06-05 NOTE — ED Provider Notes (Signed)
[facility] DEPT Provider Note   CSN: [id number] Arrival date & time: 06/05/16  1507  By signing my name below, I, Reola Mosher, attest that this documentation has been prepared under the direction and in the presence of Virgel Manifold, MD. Electronically Signed: Reola Mosher, ED Scribe. 06/05/16. 4:42 PM.  History   Chief Complaint Chief Complaint  Patient presents with  . Emesis   The history is provided by the patient and medical records. No language interpreter was used.    HPI Comments: Sharon [name] is a 61 y.o. female with a h/o COPD, CAD, small cell lung cancer (no recurrence following 6 cycles of systemic chemotherapy tolerated last year), and breast cancer (in remission ~9 years ago), who presents to the Emergency Department complaining of persistent dizziness beginning four days ago. She describes her dizziness as feeling off balance and she notes associated tinnitus and intermittent diplopia secondary to her dizziness No ear pain otherwise. Her dizziness is alleviated with laying flat and exacerbated with positional changes. Pt reports associated nausea and vomiting, but she states her dizziness and this do not necessarily coincide with on another. She notes that just prior to the onset of her current symptoms that two weeks ago that she finished undergoing prophylactic whole brain cranial irradiation on 05/18/16 d/t her h/o lung cancer. She has been taking Compazine at home without relief of her symptoms. Her friend at bedside also reports that she has recently been around several family member who have been sick with nausea and vomiting. No recent new medications or medication changes. Pt denies syncope or feeling near-syncopal, headache, fever, or any other associated symptoms.   Past Medical History:  Diagnosis Date  . Cancer of right breast ([place]) 2009  . CAP (community acquired pneumonia) 11/06/2015  . COPD (chronic obstructive pulmonary disease) ([place])   .  Elevated blood pressure   . Encounter for antineoplastic chemotherapy 01/05/2016  . Hoarseness of voice    "for the last 2 months" (11/06/2015)  . Lung mass dx'd 10/2015  . Menopause   . Personal history of breast cancer 03/29/2016  . Pre-diabetes   . Small cell lung cancer ([place])   . Vitamin D deficiency    Patient Active Problem List   Diagnosis Date Noted  . Personal history of breast cancer 03/29/2016  . Encounter for antineoplastic chemotherapy 01/05/2016  . Nausea without vomiting 12/15/2015  . Primary small cell carcinoma of left lung ([place]) 11/18/2015  . Mediastinal adenopathy   . Breast cancer ([place]) 07/18/2012  . COPD, severe ([place]) 08/18/2011  . Smoking 08/18/2011   Past Surgical History:  Procedure Laterality Date  . BREAST BIOPSY Right 2009  . BREAST LUMPECTOMY Right 2009  . UTERINE FIBROID SURGERY  2000  . VAGINAL HYSTERECTOMY  2000  . VESICOVAGINAL FISTULA CLOSURE W/ TAH  2000  . VIDEO BRONCHOSCOPY WITH ENDOBRONCHIAL ULTRASOUND N/A 11/10/2015   Procedure: VIDEO BRONCHOSCOPY WITH ENDOBRONCHIAL ULTRASOUND;  Surgeon: Javier Glazier, MD;  Location: [facility];  Service: Thoracic;  Laterality: N/A;   OB History    No data available     Home Medications    Prior to Admission medications   Medication Sig Start Date End Date Taking? Authorizing Provider  albuterol (PROVENTIL HFA;VENTOLIN HFA) 108 (90 Base) MCG/ACT inhaler Inhale 1 puff into the lungs every 6 (six) hours as needed for wheezing or shortness of breath.    Historical Provider, MD  budesonide-formoterol (SYMBICORT) 160-4.5 MCG/ACT inhaler Inhale 2 puffs into the lungs 2 (  two) times daily. 03/05/16   Javier Glazier, MD  emollient (BIAFINE) cream Apply topically 2 (two) times daily.    Historical Provider, MD  fluticasone (FLONASE) 50 MCG/ACT nasal spray Place 2 sprays into both nostrils daily as needed.  10/21/15 10/20/16  Historical Provider, MD  montelukast (SINGULAIR) 10 MG tablet Take 10 mg by mouth daily as  needed.  01/02/16   Historical Provider, MD  prochlorperazine (COMPAZINE) 10 MG tablet TAKE 1 TABLET BY MOUTH EVERY 6 HOURS AS NEEDED FOR NAUSEA AND VOMITING 03/12/16   Curt Bears, MD   Family History Family History  Problem Relation Age of Onset  . Adopted: Yes   Social History Social History  Substance Use Topics  . Smoking status: Former Smoker    Packs/day: 0.10    Years: 46.00    Types: Cigarettes    Quit date: 11/11/2015  . Smokeless tobacco: Never Used     Comment: Peak rate of 2ppd  . Alcohol use No   Allergies   Patient has no known allergies.  Review of Systems Review of Systems  Constitutional: Negative for fever.  HENT: Positive for tinnitus. Negative for ear pain.   Eyes: Positive for visual disturbance.  Gastrointestinal: Positive for nausea and vomiting.  Neurological: Positive for dizziness. Negative for syncope and headaches.  All other systems reviewed and are negative.  Physical Exam Updated Vital Signs BP 143/73 (BP Location: Left Arm)   Pulse 70   Temp 97.6 F (36.4 C) (Oral)   Resp 18   Ht 5' 6.5" (1.689 m)   Wt 245 lb (111.1 kg)   SpO2 94%   BMI 38.95 kg/m   Physical Exam  Constitutional: She appears well-developed and well-nourished.  HENT:  Head: Normocephalic.  Right Ear: Tympanic membrane, external ear and ear canal normal.  Left Ear: Tympanic membrane, external ear and ear canal normal.  Nose: Nose normal.  Mouth/Throat: Oropharynx is clear and moist.  Bilateral TMs and external auditory canals are normal in appearance.   Eyes: Conjunctivae are normal. Right eye exhibits no discharge. Left eye exhibits no discharge.  Neck: Normal range of motion.  Cardiovascular: Normal rate, regular rhythm and normal heart sounds.   No murmur heard. Pulmonary/Chest: Effort normal and breath sounds normal. No respiratory distress. She has no wheezes. She has no rales.  Abdominal: Soft. She exhibits no distension. There is no tenderness. There is  no rebound and no guarding.  Musculoskeletal: Normal range of motion. She exhibits no edema or tenderness.  Neurological: She is alert. No cranial nerve deficit. Coordination normal.  Skin: Skin is warm and dry. No rash noted. No erythema. No pallor.  Psychiatric: She has a normal mood and affect. Her behavior is normal.  Nursing note and vitals reviewed.  ED Treatments / Results  DIAGNOSTIC STUDIES: Oxygen Saturation is 94% on RA, adequate by my interpretation.   COORDINATION OF CARE: 4:42 PM-Discussed next steps with pt. Pt verbalized understanding and is agreeable with the plan.   Labs (all labs ordered are listed, but only abnormal results are displayed) Labs Reviewed  COMPREHENSIVE METABOLIC PANEL - Abnormal; Notable for the following:       Result Value   Glucose, Bld 101 (*)    All other components within normal limits  LIPASE, BLOOD  CBC   EKG  EKG Interpretation None      Radiology No results found.  Procedures Procedures  Medications Ordered in ED Medications  ondansetron (ZOFRAN-ODT) disintegrating tablet 4 mg (4 mg  Oral Given 06/05/16 1536)   Initial Impression / Assessment and Plan / ED Course  I have reviewed the triage vital signs and the nursing notes.  Pertinent labs & imaging results that were available during my care of the patient were reviewed by me and considered in my medical decision making (see chart for details).     Final Clinical Impressions(s) / ED Diagnoses   Final diagnoses:  Vertigo   New Prescriptions New Prescriptions   No medications on file   I personally preformed the services scribed in my presence. The recorded information has been reviewed is accurate. Virgel Manifold, MD.     Virgel Manifold, MD 06/16/16 [id number]

## 2016-06-05 NOTE — ED Triage Notes (Addendum)
Pt reports intermittent emesis and dizziness (room spinning) since Tuesday. Hx of lung ca. Pt had radiation to her skull 2 weeks ago. Not on chemo. Had diarrhea earlier in the week, but none now. Abd pain only before she throws up.

## 2016-06-05 NOTE — ED Notes (Signed)
Patient d/c'd self care.  F/U and medications reviewed.  Patient verbalized understanding. 

## 2016-06-07 ENCOUNTER — Telehealth: Payer: Self-pay | Admitting: Oncology

## 2016-06-07 NOTE — Telephone Encounter (Signed)
Called Sharon [name] back and she said she is still having dizziness but has not vomited since yesterday.  She is taking meclizine.  Advised her to call back in a few days if she is not feeling better.  Corazon verbalized understanding and agreement.

## 2016-06-07 NOTE — Telephone Encounter (Signed)
Sharon [name] left a message saying she was having dizziness, ear problems and vomiting.  Called her and left a message with a request for a return call.

## 2016-06-09 ENCOUNTER — Telehealth: Payer: Self-pay | Admitting: Medical Oncology

## 2016-06-09 NOTE — Telephone Encounter (Signed)
Faxed blank disability form back to [employer] with instructions to send to PCP. Pt under observation with [facility].

## 2016-06-14 ENCOUNTER — Telehealth: Payer: Self-pay | Admitting: Internal Medicine

## 2016-06-14 NOTE — Telephone Encounter (Signed)
Patient called in to confirm March appointments and was disconnected. Returned call and left message re 3/2 lab and 3/6 f/u. F/u added today per 12/4 los. Central radiology to call re scan and also left message with phone number for patient to follow up. Patient also call back and confirmed this information with [facility].

## 2016-06-16 ENCOUNTER — Telehealth: Payer: Self-pay | Admitting: Internal Medicine

## 2016-06-16 NOTE — Telephone Encounter (Signed)
Received FMLA paperwork to be completed °

## 2016-06-18 ENCOUNTER — Encounter: Payer: Self-pay | Admitting: Radiation Oncology

## 2016-06-18 NOTE — Progress Notes (Signed)
Paperwork ([employer]ential) received 06/17/16, given to nurse 06/18/16

## 2016-06-21 ENCOUNTER — Telehealth: Payer: Self-pay | Admitting: Internal Medicine

## 2016-06-21 NOTE — Telephone Encounter (Signed)
Faxed disability paperwork to [employer] at [phone]

## 2016-06-23 ENCOUNTER — Encounter: Payer: Self-pay | Admitting: Oncology

## 2016-06-24 ENCOUNTER — Encounter: Payer: Self-pay | Admitting: Radiation Oncology

## 2016-06-24 ENCOUNTER — Ambulatory Visit
Admission: RE | Admit: 2016-06-24 | Discharge: 2016-06-24 | Disposition: A | Discharge status: Home / Self Care | Payer: 59 | Source: Ambulatory Visit | Attending: Radiation Oncology | Admitting: Radiation Oncology

## 2016-06-24 DIAGNOSIS — Z79899 Other long term (current) drug therapy: Secondary | ICD-10-CM | POA: Diagnosis not present

## 2016-06-24 DIAGNOSIS — R42 Dizziness and giddiness: Secondary | ICD-10-CM | POA: Diagnosis not present

## 2016-06-24 DIAGNOSIS — Z923 Personal history of irradiation: Secondary | ICD-10-CM | POA: Diagnosis not present

## 2016-06-24 DIAGNOSIS — R112 Nausea with vomiting, unspecified: Secondary | ICD-10-CM | POA: Insufficient documentation

## 2016-06-24 DIAGNOSIS — R590 Localized enlarged lymph nodes: Secondary | ICD-10-CM | POA: Diagnosis not present

## 2016-06-24 DIAGNOSIS — C3412 Malignant neoplasm of upper lobe, left bronchus or lung: Secondary | ICD-10-CM | POA: Diagnosis not present

## 2016-06-24 DIAGNOSIS — R5383 Other fatigue: Secondary | ICD-10-CM | POA: Insufficient documentation

## 2016-06-24 DIAGNOSIS — C3492 Malignant neoplasm of unspecified part of left bronchus or lung: Secondary | ICD-10-CM

## 2016-06-24 NOTE — Progress Notes (Signed)
Sharon [name] is here for follow up.  She reports having vertigo for the past 3 weeks.  She reports having nausea and vomiting on and off.  She is taking Antivert and antiemitcs.  She reports having fatigue and wants to sleep all the time.  She tried to go back to work but had trouble driving.  Her hair has fallen out.  Her skin has slight hyperpigmentation.  She is using biafine.  She is concerned that her voice is hoarse.  BP 131/69 (BP Location: Right Arm, Patient Position: Sitting)   Pulse 74   Temp 98.3 F (36.8 C) (Oral)   Ht 5' 6.5" (1.689 m)   Wt 264 lb 3.2 oz (119.8 kg)   SpO2 98%   BMI 42.00 kg/m    Wt Readings from Last 3 Encounters:  06/24/16 264 lb 3.2 oz (119.8 kg)  06/05/16 245 lb (111.1 kg)  05/18/16 266 lb 12.8 oz (121 kg)

## 2016-06-24 NOTE — Progress Notes (Signed)
Radiation Oncology         [id number]) [phone] ________________________________  Name: Sharon [name] MRN: [id number]  Date: 06/24/2016  DOB: 02-11-1956  Follow-Up Visit Note  CC: [name], [place]  Curt Bears, MD    ICD-9-CM ICD-10-CM   1. Primary small cell carcinoma of left lung ([name]) 162.9 C34.92     Diagnosis:   The encounter diagnosis was Primary small cell carcinoma of left lung ([place]). Extensive stage (T2b, N2, M1 8) small cell lung cancer presented with large left upper lobe mass and mediastinal lymphadenopathy  Interval Since Last Radiation:  1 month 05/04/16 - 05/18/16 : Brain treated to 25 Gy in 10 fractions, Prophylactic treatment  Narrative:  The patient returns today for routine follow-up. The patient presented to the ED on 06/05/16 for vertigo. She reports today that she has had vertigo for the past 3 weeks. She reports having nausea and vomiting on and off. She is taking Antivert and antiemitcs.  She reports having fatigue and wants to sleep all the time. She tried to go back to work but had trouble driving. Her hair has fallen out. Per nursing, her skin has slight hyperpigmentation. She is using biafine. She is concerned that her voice is hoarse.    ALLERGIES:  has No Known Allergies.  Meds: Current Outpatient Prescriptions  Medication Sig Dispense Refill  . albuterol (PROVENTIL HFA;VENTOLIN HFA) 108 (90 Base) MCG/ACT inhaler Inhale 1 puff into the lungs every 6 (six) hours as needed for wheezing or shortness of breath.    . budesonide-formoterol (SYMBICORT) 160-4.5 MCG/ACT inhaler Inhale 2 puffs into the lungs 2 (two) times daily. 1 Inhaler 12  . fluticasone (FLONASE) 50 MCG/ACT nasal spray Place 2 sprays into both nostrils daily as needed.     . meclizine (ANTIVERT) 12.5 MG tablet Take 1-2 tablets (12.5-25 mg total) by mouth 3 (three) times daily as needed for dizziness. 20 tablet 0  . montelukast (SINGULAIR) 10 MG tablet Take 10 mg by mouth daily as needed.     .  prochlorperazine (COMPAZINE) 10 MG tablet TAKE 1 TABLET BY MOUTH EVERY 6 HOURS AS NEEDED FOR NAUSEA AND VOMITING 360 tablet 1  . promethazine (PHENERGAN) 25 MG tablet Take 1 tablet (25 mg total) by mouth every 8 (eight) hours as needed for nausea or vomiting. 20 tablet 0  . emollient (BIAFINE) cream Apply topically 2 (two) times daily.     No current facility-administered medications for this encounter.     Physical Findings: The patient is in no acute distress. Patient is alert and oriented.  height is 5' 6.5" (1.689 m) and weight is 264 lb 3.2 oz (119.8 kg). Her oral temperature is 98.3 F (36.8 C). Her blood pressure is 131/69 and her pulse is 74. Her oxygen saturation is 98%.  No significant changes. Oral cavity is clear. No supraclavicular, cervical, or axillary lymphadenopathy or masses appreciated. Heart is regular in rate and rhythm, with no murmurs. Lungs are clear to auscultation bilaterally. Hair loss from chemotherapy And radiation to the brain..  The neurological exam is nonfocal  Lab Findings: Lab Results  Component Value Date   WBC 9.0 06/05/2016   HGB 13.2 06/05/2016   HCT 39.1 06/05/2016   MCV 97.5 06/05/2016   PLT 280 06/05/2016    Radiographic Findings: No results found.  Impression:  The patient is recovering from the effects of radiation. She has completed chemotherapy. She is continuing to experience sporadic nausea. We discussed starting the patient on Decadron to  alleviate some of her symptoms. She declined at this time.  Plan:  The patient will follow up with me in 1 month. She will continue to follow with medical oncology as indicated.  I advised the patient that I don't think she is ready to go back to work full time. I encouraged her to continue to allow herself time to recover before returning to work. I have signed forms to keep the patient out of work.  -----------------------------------  Blair Promise, PhD, MD  This document serves as a record of  services personally performed by Gery Pray, MD. It was created on his behalf by Maryla Morrow, a trained medical scribe. The creation of this record is based on the scribe's personal observations and the provider's statements to them. This document has been checked and approved by the attending provider.

## 2016-06-25 ENCOUNTER — Other Ambulatory Visit: Payer: 59

## 2016-06-28 ENCOUNTER — Encounter (HOSPITAL_COMMUNITY): Payer: Self-pay

## 2016-06-28 ENCOUNTER — Ambulatory Visit (HOSPITAL_COMMUNITY)
Admission: RE | Admit: 2016-06-28 | Discharge: 2016-06-28 | Disposition: A | Discharge status: Home / Self Care | Payer: 59 | Source: Ambulatory Visit | Attending: Internal Medicine | Admitting: Internal Medicine

## 2016-06-28 ENCOUNTER — Encounter: Payer: Self-pay | Admitting: Radiation Oncology

## 2016-06-28 ENCOUNTER — Other Ambulatory Visit: Payer: Self-pay | Admitting: Internal Medicine

## 2016-06-28 ENCOUNTER — Other Ambulatory Visit (HOSPITAL_BASED_OUTPATIENT_CLINIC_OR_DEPARTMENT_OTHER): Payer: 59

## 2016-06-28 ENCOUNTER — Other Ambulatory Visit: Payer: Self-pay | Admitting: Medical Oncology

## 2016-06-28 DIAGNOSIS — Z853 Personal history of malignant neoplasm of breast: Secondary | ICD-10-CM

## 2016-06-28 DIAGNOSIS — Z5111 Encounter for antineoplastic chemotherapy: Secondary | ICD-10-CM | POA: Insufficient documentation

## 2016-06-28 DIAGNOSIS — I700 Atherosclerosis of aorta: Secondary | ICD-10-CM | POA: Insufficient documentation

## 2016-06-28 DIAGNOSIS — J449 Chronic obstructive pulmonary disease, unspecified: Secondary | ICD-10-CM | POA: Insufficient documentation

## 2016-06-28 DIAGNOSIS — C3492 Malignant neoplasm of unspecified part of left bronchus or lung: Secondary | ICD-10-CM | POA: Diagnosis not present

## 2016-06-28 DIAGNOSIS — R911 Solitary pulmonary nodule: Secondary | ICD-10-CM | POA: Insufficient documentation

## 2016-06-28 DIAGNOSIS — K760 Fatty (change of) liver, not elsewhere classified: Secondary | ICD-10-CM | POA: Insufficient documentation

## 2016-06-28 LAB — CBC WITH DIFFERENTIAL/PLATELET
BASO%: 0.7 % (ref 0.0–2.0)
BASOS ABS: 0.1 10*3/uL (ref 0.0–0.1)
EOS%: 1.8 % (ref 0.0–7.0)
Eosinophils Absolute: 0.2 10*3/uL (ref 0.0–0.5)
HCT: 41.5 % (ref 34.8–46.6)
HGB: 14 g/dL (ref 11.6–15.9)
LYMPH%: 14 % (ref 14.0–49.7)
MCH: 33.2 pg (ref 25.1–34.0)
MCHC: 33.9 g/dL (ref 31.5–36.0)
MCV: 98.1 fL (ref 79.5–101.0)
MONO#: 0.6 10*3/uL (ref 0.1–0.9)
MONO%: 7.1 % (ref 0.0–14.0)
NEUT#: 6.5 10*3/uL (ref 1.5–6.5)
NEUT%: 76.4 % (ref 38.4–76.8)
Platelets: 312 10*3/uL (ref 145–400)
RBC: 4.23 10*6/uL (ref 3.70–5.45)
RDW: 13.9 % (ref 11.2–14.5)
WBC: 8.5 10*3/uL (ref 3.9–10.3)
lymph#: 1.2 10*3/uL (ref 0.9–3.3)

## 2016-06-28 LAB — COMPREHENSIVE METABOLIC PANEL
ALT: 41 U/L (ref 0–55)
AST: 29 U/L (ref 5–34)
Albumin: 3.9 g/dL (ref 3.5–5.0)
Alkaline Phosphatase: 90 U/L (ref 40–150)
Anion Gap: 10 mEq/L (ref 3–11)
BUN: 12.5 mg/dL (ref 7.0–26.0)
CALCIUM: 10.3 mg/dL (ref 8.4–10.4)
CHLORIDE: 101 meq/L (ref 98–109)
CO2: 28 mEq/L (ref 22–29)
Creatinine: 0.8 mg/dL (ref 0.6–1.1)
EGFR: 79 mL/min/{1.73_m2} — ABNORMAL LOW (ref >90)
GLUCOSE: 95 mg/dL (ref 70–140)
POTASSIUM: 4.5 meq/L (ref 3.5–5.1)
SODIUM: 139 meq/L (ref 136–145)
Total Bilirubin: 0.52 mg/dL (ref 0.20–1.20)
Total Protein: 7.7 g/dL (ref 6.4–8.3)

## 2016-06-28 MED ORDER — IOPAMIDOL (ISOVUE-300) INJECTION 61%
100.0000 mL | Freq: Once | INTRAVENOUS | Status: AC | PRN
  Administered 2016-06-28: 100 mL via INTRAVENOUS

## 2016-06-28 MED ORDER — IOPAMIDOL (ISOVUE-300) INJECTION 61%
INTRAVENOUS | Status: AC
  Filled 2016-06-28: qty 100

## 2016-06-28 NOTE — Progress Notes (Signed)
Paperwork (prudential) received and faxed to [phone], conf received 06/24/16, copy ,ailed to patient

## 2016-06-29 ENCOUNTER — Telehealth: Payer: Self-pay | Admitting: Internal Medicine

## 2016-06-29 ENCOUNTER — Encounter: Payer: Self-pay | Admitting: Internal Medicine

## 2016-06-29 ENCOUNTER — Ambulatory Visit (HOSPITAL_BASED_OUTPATIENT_CLINIC_OR_DEPARTMENT_OTHER): Payer: 59 | Admitting: Internal Medicine

## 2016-06-29 VITALS — BP 130/73 | HR 76 | Temp 97.7°F | Resp 16 | Ht 66.0 in | Wt 264.7 lb

## 2016-06-29 DIAGNOSIS — C3412 Malignant neoplasm of upper lobe, left bronchus or lung: Secondary | ICD-10-CM

## 2016-06-29 DIAGNOSIS — J910 Malignant pleural effusion: Secondary | ICD-10-CM

## 2016-06-29 DIAGNOSIS — C3492 Malignant neoplasm of unspecified part of left bronchus or lung: Secondary | ICD-10-CM

## 2016-06-29 NOTE — Progress Notes (Signed)
[facility] Telephone:([phone]) [phone]   Fax:(336) [phone]  OFFICE PROGRESS NOTE  [name], PA [place] [address]  DIAGNOSIS: Extensive stage (T2b, N2, M1a) small cell lung cancer presented with large left upper lobe lung mass and mediastinal lymphadenopathy as well as malignant left pleural effusion diagnosed in July 2017.  PRIOR THERAPY:  1) Systemic chemotherapy with cisplatin 60 MG/M2 on day 1 and etoposide at 120 MG/M2 on days 1, 2 and 3 with Neulasta support on day 4. She is status post 6 cycles. 2) prophylactic cranial irradiation under the care of Dr. Sondra Come completed on 05/18/2016.  CURRENT THERAPY: Observation.  INTERVAL HISTORY: Sharon [name] 61 y.o. female came to the clinic today for three-month follow-up visit. The patient is feeling fine today with no specific complaints except for fatigue. She completed prophylactic cranial irradiation the last week of January 2018. She went to work 1 week after her brain radiation and she felt more fatigued and tired. She is currently off work. She denied having any current chest pain, shortness breath, cough or hemoptysis. She has no nausea, vomiting, diarrhea or constipation. She denied having any significant weight loss or night sweats. The patient had repeat CT scan of the chest, abdomen and pelvis performed recently and she is here for evaluation and discussion of her scan results.  MEDICAL HISTORY: Past Medical History:  Diagnosis Date  . Cancer of right breast ([place]) 2009  . CAP (community acquired pneumonia) 11/06/2015  . COPD (chronic obstructive pulmonary disease) ([place])   . Elevated blood pressure   . Encounter for antineoplastic chemotherapy 01/05/2016  . History of radiation therapy 05/04/16-05/18/16   whole brain 25 Gy in 10 fractions  . Hoarseness of voice    "for the last 2 months" (11/06/2015)  . Lung mass dx'd 10/2015  . Menopause   . Personal history of breast cancer  03/29/2016  . Pre-diabetes   . Small cell lung cancer ([place])   . Vitamin D deficiency     ALLERGIES:  has No Known Allergies.  MEDICATIONS:  Current Outpatient Prescriptions  Medication Sig Dispense Refill  . albuterol (PROVENTIL HFA;VENTOLIN HFA) 108 (90 Base) MCG/ACT inhaler Inhale 1 puff into the lungs every 6 (six) hours as needed for wheezing or shortness of breath.    . budesonide-formoterol (SYMBICORT) 160-4.5 MCG/ACT inhaler Inhale 2 puffs into the lungs 2 (two) times daily. 1 Inhaler 12  . emollient (BIAFINE) cream Apply topically 2 (two) times daily.    . fluticasone (FLONASE) 50 MCG/ACT nasal spray Place 2 sprays into both nostrils daily as needed.     . meclizine (ANTIVERT) 12.5 MG tablet Take 1-2 tablets (12.5-25 mg total) by mouth 3 (three) times daily as needed for dizziness. 20 tablet 0  . montelukast (SINGULAIR) 10 MG tablet Take 10 mg by mouth daily as needed.     . prochlorperazine (COMPAZINE) 10 MG tablet TAKE 1 TABLET BY MOUTH EVERY 6 HOURS AS NEEDED FOR NAUSEA AND VOMITING 360 tablet 1  . promethazine (PHENERGAN) 25 MG tablet Take 1 tablet (25 mg total) by mouth every 8 (eight) hours as needed for nausea or vomiting. 20 tablet 0   No current facility-administered medications for this visit.     SURGICAL HISTORY:  Past Surgical History:  Procedure Laterality Date  . BREAST BIOPSY Right 2009  . BREAST LUMPECTOMY Right 2009  . UTERINE FIBROID SURGERY  2000  . VAGINAL HYSTERECTOMY  2000  . VESICOVAGINAL  FISTULA CLOSURE W/ TAH  2000  . VIDEO BRONCHOSCOPY WITH ENDOBRONCHIAL ULTRASOUND N/A 11/10/2015   Procedure: VIDEO BRONCHOSCOPY WITH ENDOBRONCHIAL ULTRASOUND;  Surgeon: Javier Glazier, MD;  Location: [facility];  Service: Thoracic;  Laterality: N/A;    REVIEW OF SYSTEMS:  A comprehensive review of systems was negative except for: Constitutional: positive for fatigue   PHYSICAL EXAMINATION: General appearance: alert, cooperative, fatigued and no distress Head:  Normocephalic, without obvious abnormality, atraumatic Neck: no adenopathy, no JVD, supple, symmetrical, trachea midline and thyroid not enlarged, symmetric, no tenderness/mass/nodules Lymph nodes: Cervical, supraclavicular, and axillary nodes normal. Resp: clear to auscultation bilaterally Back: symmetric, no curvature. ROM normal. No CVA tenderness. Cardio: regular rate and rhythm, S1, S2 normal, no murmur, click, rub or gallop GI: soft, non-tender; bowel sounds normal; no masses,  no organomegaly Extremities: extremities normal, atraumatic, no cyanosis or edema  ECOG PERFORMANCE STATUS: 1 - Symptomatic but completely ambulatory  Blood pressure 130/73, pulse 76, temperature 97.7 F (36.5 C), temperature source Oral, resp. rate 16, height '5\' 6"'$  (1.676 m), weight 264 lb 11.2 oz (120.1 kg), SpO2 96 %.  LABORATORY DATA: Lab Results  Component Value Date   WBC 8.5 06/28/2016   HGB 14.0 06/28/2016   HCT 41.5 06/28/2016   MCV 98.1 06/28/2016   PLT 312 06/28/2016      Chemistry      Component Value Date/Time   NA 139 06/28/2016 1320   K 4.5 06/28/2016 1320   CL 102 06/05/2016 1544   CL 104 07/18/2012 1451   CO2 28 06/28/2016 1320   BUN 12.5 06/28/2016 1320   CREATININE 0.8 06/28/2016 1320      Component Value Date/Time   CALCIUM 10.3 06/28/2016 1320   ALKPHOS 90 06/28/2016 1320   AST 29 06/28/2016 1320   ALT 41 06/28/2016 1320   BILITOT 0.52 06/28/2016 1320       RADIOGRAPHIC STUDIES: Ct Chest W Contrast  Result Date: 06/28/2016 CLINICAL DATA:  Small cell lung cancer, chemotherapy complete. EXAM: CT CHEST, ABDOMEN, AND PELVIS WITH CONTRAST TECHNIQUE: Multidetector CT imaging of the chest, abdomen and pelvis was performed following the standard protocol during bolus administration of intravenous contrast. CONTRAST:  145m ISOVUE-300 IOPAMIDOL (ISOVUE-300) INJECTION 61% COMPARISON:  03/26/2016 and PET 11/19/2015. FINDINGS: CT CHEST FINDINGS Cardiovascular: Vascular structures  are unremarkable. Heart size normal. No pericardial effusion. Mediastinum/Nodes: No pathologically enlarged mediastinal, hilar or axillary lymph nodes. Esophagus is grossly unremarkable. Lungs/Pleura: Post treatment scarring is seen in the left upper lobe, stable. However, there is a new nodular component in the anterior segment left upper lobe, measuring 10 mm (series 4, image 52). Mild centrilobular emphysema. Trace paraseptal emphysema. No pleural fluid. Airway is unremarkable. Musculoskeletal: No worrisome lytic or sclerotic lesions. CT ABDOMEN PELVIS FINDINGS Hepatobiliary: Liver is decreased in attenuation diffusely, with some areas of sparing. Liver measures 22.5 cm. Gallbladder is unremarkable. No biliary ductal dilatation. Pancreas: Negative. Spleen: Negative. Adrenals/Urinary Tract: Thickening of the body of the right adrenal gland, as before. Adrenal glands and kidneys are otherwise unremarkable. Ureters are decompressed. Bladder is grossly unremarkable. Stomach/Bowel: Stomach, small bowel, appendix and colon are unremarkable. Vascular/Lymphatic: Minimal atherosclerotic calcification of the arterial vasculature without abdominal aortic aneurysm. No pathologically enlarged lymph nodes. Reproductive: Hysterectomy.  No adnexal mass. Other: No free fluid.  Mesenteries and peritoneum are unremarkable. Musculoskeletal: No worrisome lytic or sclerotic lesions. IMPRESSION: 1. Linear post treatment scarring in the left upper lobe with a new nodule in the anterior segment left upper lobe, worrisome  for disease recurrence. 2. Fatty enlarged liver. Probable areas of fatty sparing. If further evaluation is desired, MR abdomen without and with contrast is recommended. 3. Right adrenal thickening, characterized as an adenoma on 11/19/2015. 4.  Aortic atherosclerosis (ICD10-170.0). Electronically Signed   By: Lorin Picket M.D.   On: 06/28/2016 15:58   Ct Abdomen W Contrast  Result Date: 06/28/2016 CLINICAL DATA:   Small cell lung cancer, chemotherapy complete. EXAM: CT CHEST, ABDOMEN, AND PELVIS WITH CONTRAST TECHNIQUE: Multidetector CT imaging of the chest, abdomen and pelvis was performed following the standard protocol during bolus administration of intravenous contrast. CONTRAST:  161m ISOVUE-300 IOPAMIDOL (ISOVUE-300) INJECTION 61% COMPARISON:  03/26/2016 and PET 11/19/2015. FINDINGS: CT CHEST FINDINGS Cardiovascular: Vascular structures are unremarkable. Heart size normal. No pericardial effusion. Mediastinum/Nodes: No pathologically enlarged mediastinal, hilar or axillary lymph nodes. Esophagus is grossly unremarkable. Lungs/Pleura: Post treatment scarring is seen in the left upper lobe, stable. However, there is a new nodular component in the anterior segment left upper lobe, measuring 10 mm (series 4, image 52). Mild centrilobular emphysema. Trace paraseptal emphysema. No pleural fluid. Airway is unremarkable. Musculoskeletal: No worrisome lytic or sclerotic lesions. CT ABDOMEN PELVIS FINDINGS Hepatobiliary: Liver is decreased in attenuation diffusely, with some areas of sparing. Liver measures 22.5 cm. Gallbladder is unremarkable. No biliary ductal dilatation. Pancreas: Negative. Spleen: Negative. Adrenals/Urinary Tract: Thickening of the body of the right adrenal gland, as before. Adrenal glands and kidneys are otherwise unremarkable. Ureters are decompressed. Bladder is grossly unremarkable. Stomach/Bowel: Stomach, small bowel, appendix and colon are unremarkable. Vascular/Lymphatic: Minimal atherosclerotic calcification of the arterial vasculature without abdominal aortic aneurysm. No pathologically enlarged lymph nodes. Reproductive: Hysterectomy.  No adnexal mass. Other: No free fluid.  Mesenteries and peritoneum are unremarkable. Musculoskeletal: No worrisome lytic or sclerotic lesions. IMPRESSION: 1. Linear post treatment scarring in the left upper lobe with a new nodule in the anterior segment left upper  lobe, worrisome for disease recurrence. 2. Fatty enlarged liver. Probable areas of fatty sparing. If further evaluation is desired, MR abdomen without and with contrast is recommended. 3. Right adrenal thickening, characterized as an adenoma on 11/19/2015. 4.  Aortic atherosclerosis (ICD10-170.0). Electronically Signed   By: MLorin PicketM.D.   On: 06/28/2016 15:58    ASSESSMENT AND PLAN:  This is a very pleasant 61years old white female with extensive stage small cell lung cancer status post 6 cycles of systemic chemotherapy with cisplatin and etoposide followed by prophylactic cranial irradiation. The patient is currently on observation and has no complaints except for fatigue after the prophylactic cranial irradiation. She has repeat CT scan of the chest, abdomen and pelvis performed recently and it showed no evidence for disease progression. I discussed the scan results with the patient today. I recommended for her to continue on observation with repeat CT scan of the chest, abdomen and pelvis in 3 months for restaging of her disease. She was advised to call immediately if she has any concerning symptoms in the interval. The patient voices understanding of current disease status and treatment options and is in agreement with the current care plan. All questions were answered. The patient knows to call the clinic with any problems, questions or concerns. We can certainly see the patient much sooner if necessary. I spent 10 minutes counseling the patient face to face. The total time spent in the appointment was 15 minutes.  Disclaimer: This note was dictated with voice recognition software. Similar sounding words can inadvertently be transcribed and may not  be corrected upon review.

## 2016-06-29 NOTE — Telephone Encounter (Signed)
Gave patient AVS and calender per 06/29/2016 los. [facility] to contact patient with CT schedule.

## 2016-07-01 ENCOUNTER — Telehealth: Payer: Self-pay | Admitting: Gynecologic Oncology

## 2016-07-01 DIAGNOSIS — Z598 Other problems related to housing and economic circumstances: Secondary | ICD-10-CM | POA: Insufficient documentation

## 2016-07-01 DIAGNOSIS — Z599 Problem related to housing and economic circumstances, unspecified: Secondary | ICD-10-CM | POA: Insufficient documentation

## 2016-07-01 NOTE — Telephone Encounter (Signed)
Received LT Disability paperwork to be completed

## 2016-07-02 ENCOUNTER — Telehealth: Payer: Self-pay | Admitting: Medical Oncology

## 2016-07-02 ENCOUNTER — Encounter: Payer: Self-pay | Admitting: Radiation Oncology

## 2016-07-02 NOTE — Telephone Encounter (Signed)
Pt read CT scan and noted report about " there is a new nodular component in the anterior segment left upper lobe, measuring 10 mm (series 4, image".  She does not recall that this was mentioned and should she be concerned about this.

## 2016-07-02 NOTE — Progress Notes (Signed)
Paperwork (prudential) received 07/01/16, given to nurse 07/05/16

## 2016-07-03 ENCOUNTER — Telehealth: Payer: Self-pay | Admitting: Medical Oncology

## 2016-07-03 NOTE — Telephone Encounter (Signed)
It is a small nodule close to the radiation field. Will monitor on the upcoming scan. We can move the scan sooner in 2 months if she is concerned.

## 2016-07-03 NOTE — Telephone Encounter (Signed)
err

## 2016-07-05 NOTE — Telephone Encounter (Signed)
Pt called lnovm " I have some questions about my scan. Please call me." Called pt unable to reach, lmovm. With MD's response to pt's concerns. Request pt to call back with any additional concerns.

## 2016-07-06 ENCOUNTER — Encounter: Payer: Self-pay | Admitting: Radiation Oncology

## 2016-07-06 NOTE — Progress Notes (Signed)
Paperwork( prudential) received, notes sent to [phone], attn: Guss Bunde, confirmation received 07/05/16

## 2016-08-02 ENCOUNTER — Ambulatory Visit: Payer: 59 | Admitting: Pulmonary Disease

## 2016-08-03 ENCOUNTER — Telehealth: Payer: Self-pay | Admitting: Medical Oncology

## 2016-08-03 NOTE — Telephone Encounter (Signed)
Please clarify with pt scan results from 3/5. She is concerned about impression statement 1.

## 2016-08-04 NOTE — Telephone Encounter (Signed)
Called and left her a message to call back if she wants to have scan done sooner for reassurance. Thank you.

## 2016-08-05 ENCOUNTER — Telehealth: Payer: Self-pay | Admitting: Medical Oncology

## 2016-08-05 ENCOUNTER — Ambulatory Visit
Admission: RE | Admit: 2016-08-05 | Discharge: 2016-08-05 | Disposition: A | Discharge status: Home / Self Care | Payer: 59 | Source: Ambulatory Visit | Attending: Radiation Oncology | Admitting: Radiation Oncology

## 2016-08-05 VITALS — BP 118/78 | HR 78 | Temp 98.1°F | Resp 20 | Wt 250.8 lb

## 2016-08-05 DIAGNOSIS — C7931 Secondary malignant neoplasm of brain: Secondary | ICD-10-CM | POA: Diagnosis not present

## 2016-08-05 DIAGNOSIS — Z510 Encounter for antineoplastic radiation therapy: Secondary | ICD-10-CM | POA: Insufficient documentation

## 2016-08-05 DIAGNOSIS — Z79899 Other long term (current) drug therapy: Secondary | ICD-10-CM | POA: Diagnosis not present

## 2016-08-05 DIAGNOSIS — C3492 Malignant neoplasm of unspecified part of left bronchus or lung: Secondary | ICD-10-CM | POA: Insufficient documentation

## 2016-08-05 DIAGNOSIS — Z9221 Personal history of antineoplastic chemotherapy: Secondary | ICD-10-CM | POA: Diagnosis not present

## 2016-08-05 NOTE — Progress Notes (Addendum)
Sueko Girton is here today for follow up after completion of prophylactic cranial irradiation on 05/18/2016.  Patient denies any pain.  Patient does report great concern about continued vertigo and dizzy spells and the hoarseness in her voice.  Has appointment with ENT next week for hoarseness.  She stated that all started once she started radiation.  Patient states she is unable to work because of the vertigo.  She is experiencing a great deal of fatigue.  Patient reports having some fluctuation in her appetite.  States her memory was impaired during the initial vertigo but states that it has improved.  She states her hands are more shaky.  Reports occassional ringing in the ears.  She is using over the counter drops in her ears, she is unable to recall the name of that medication.  Denies any double/blurred/vision.  Vitals:   08/05/16 1611  BP: 118/78  Pulse: 78  Resp: 20  Temp: 98.1 F (36.7 C)  TempSrc: Oral  SpO2: 97%  Weight: 250 lb 12.8 oz (113.8 kg)    Wt Readings from Last 3 Encounters:  08/05/16 250 lb 12.8 oz (113.8 kg)  06/29/16 264 lb 11.2 oz (120.1 kg)  06/24/16 264 lb 3.2 oz (119.8 kg)

## 2016-08-05 NOTE — Progress Notes (Signed)
Radiation Oncology         [phone]) [phone] ________________________________  Name: Sharon [name] MRN: [id number]  Date: 08/05/2016  DOB: May 24, 1955  Follow-Up Visit Note  CC: [name], [place]  Curt Bears, MD    ICD-9-CM ICD-10-CM   1. Primary small cell carcinoma of left lung ([name]) 162.9 C34.92     Diagnosis:   The encounter diagnosis was Primary small cell carcinoma of left lung ([place]). Extensive stage (T2b, N2, M1 ) small cell lung cancer presented with large left upper lobe mass and mediastinal lymphadenopathy  Interval Since Last Radiation:  3 months 2 weeks 05/04/16 - 05/18/16 : Brain treated to 25 Gy in 10 fractions, Prophylactic treatment  Narrative:  The patient returns today for routine follow-up of radiation completed 05/18/16.  On review of systems, the patient denies any pain.  Patient does report great concern about continued vertigo and dizzy spells and the hoarseness in her voice.  She has an appointment with ENT next week for hoarseness.  She stated that all started once she started radiation.  Patient states she is unable to work because of the vertigo.  She is experiencing a great deal of fatigue.  Patient reports having some fluctuation in her appetite.  States her memory was impaired during the initial vertigo but that it has improved.  She states her hands are more shaky.  She reports occassional ringing in the ears.  She is using over the counter drops in her ears, though she is unable to recall the name of that medication.  Denies any double or blurred vision. She reports a desire to lose weight.   The patient reports she does not feel she is prepared to go back to work.  ALLERGIES:  has No Known Allergies.  Meds: Current Outpatient Prescriptions  Medication Sig Dispense Refill  . albuterol (PROVENTIL HFA;VENTOLIN HFA) 108 (90 Base) MCG/ACT inhaler Inhale 1 puff into the lungs every 6 (six) hours as needed for wheezing or shortness of breath.    .  budesonide-formoterol (SYMBICORT) 160-4.5 MCG/ACT inhaler Inhale 2 puffs into the lungs 2 (two) times daily. 1 Inhaler 12  . Cholecalciferol (VITAMIN D3) 50000 units CAPS Take 1 tablet by mouth daily.    . fluticasone (FLONASE) 50 MCG/ACT nasal spray Place 2 sprays into both nostrils daily as needed.     . montelukast (SINGULAIR) 10 MG tablet Take 10 mg by mouth daily as needed.     . prochlorperazine (COMPAZINE) 10 MG tablet TAKE 1 TABLET BY MOUTH EVERY 6 HOURS AS NEEDED FOR NAUSEA AND VOMITING 360 tablet 1  . emollient (BIAFINE) cream Apply topically 2 (two) times daily.    . meclizine (ANTIVERT) 12.5 MG tablet Take 1-2 tablets (12.5-25 mg total) by mouth 3 (three) times daily as needed for dizziness. (Patient not taking: Reported on 08/05/2016) 20 tablet 0  . promethazine (PHENERGAN) 25 MG tablet Take 1 tablet (25 mg total) by mouth every 8 (eight) hours as needed for nausea or vomiting. (Patient not taking: Reported on 08/05/2016) 20 tablet 0   No current facility-administered medications for this encounter.     Physical Findings: The patient is in no acute distress. Patient is alert and oriented.  weight is 250 lb 12.8 oz (113.8 kg). Her oral temperature is 98.1 F (36.7 C). Her blood pressure is 118/78 and her pulse is 78. Her respiration is 20 and oxygen saturation is 97%.  No significant changes. EOMI. Oral cavity is clear. Patient has a raspy voice.  Left ear shows tympanic membrane is difficult to see in light of cerumen. Right ear shows tympanic membrane is slightly dull. No supraclavicular, cervical, or axillary lymphadenopathy or masses appreciated. Heart is regular in rate and rhythm, with no murmurs. Lungs are clear to auscultation bilaterally. Muscle strength symmetric in all extremities. Hair loss from chemotherapy and radiation to the brain, with some regrowth noted at the base of skull. The neurological exam is nonfocal.  Lab Findings: Lab Results  Component Value Date   WBC 8.5  06/28/2016   HGB 14.0 06/28/2016   HCT 41.5 06/28/2016   MCV 98.1 06/28/2016   PLT 312 06/28/2016    Radiographic Findings: No results found.  Impression: Patient continues to have problems with vertigo and is not ready to resume work. She will see ENT with [facility] in the near future. We discussed consideration of brain MRI as the last study was approximately 3 months ago, but she would like to hold off on this study as it was difficult for her to tolerate previously.  Plan:  The patient will follow up with me in 3 months.  Patient's continued vertigo and raspy voice will be evaluated with her ENT consultation.  -----------------------------------  Blair Promise, PhD, MD  This document serves as a record of services personally performed by Gery Pray, MD. It was created on his behalf by Maryla Morrow, a trained medical scribe. The creation of this record is based on the scribe's personal observations and the provider's statements to them. This document has been checked and approved by the attending provider.

## 2016-08-05 NOTE — Telephone Encounter (Signed)
Does not want scan now.

## 2016-08-05 NOTE — Telephone Encounter (Signed)
Pt called back and she does not to do a scan earlier

## 2016-08-06 ENCOUNTER — Telehealth: Payer: Self-pay | Admitting: *Deleted

## 2016-08-06 NOTE — Telephone Encounter (Signed)
error 

## 2016-08-06 NOTE — Telephone Encounter (Signed)
CALLED PATIENT TO INFORM OF FU ON 11-18-16 @ 10:45 AM, PATIENT AGREED TO THIS DATE AND TIME

## 2016-08-17 ENCOUNTER — Encounter: Payer: Self-pay | Admitting: Pulmonary Disease

## 2016-08-17 ENCOUNTER — Other Ambulatory Visit: Payer: Self-pay | Admitting: Internal Medicine

## 2016-08-17 ENCOUNTER — Ambulatory Visit (HOSPITAL_BASED_OUTPATIENT_CLINIC_OR_DEPARTMENT_OTHER)
Admission: RE | Admit: 2016-08-17 | Discharge: 2016-08-17 | Disposition: A | Discharge status: Home / Self Care | Payer: 59 | Source: Ambulatory Visit | Attending: Pulmonary Disease | Admitting: Pulmonary Disease

## 2016-08-17 ENCOUNTER — Ambulatory Visit (INDEPENDENT_AMBULATORY_CARE_PROVIDER_SITE_OTHER): Payer: 59 | Admitting: Pulmonary Disease

## 2016-08-17 VITALS — BP 114/60 | HR 85 | Ht 66.0 in | Wt 250.8 lb

## 2016-08-17 DIAGNOSIS — R042 Hemoptysis: Secondary | ICD-10-CM | POA: Diagnosis not present

## 2016-08-17 DIAGNOSIS — Z23 Encounter for immunization: Secondary | ICD-10-CM

## 2016-08-17 DIAGNOSIS — C3492 Malignant neoplasm of unspecified part of left bronchus or lung: Secondary | ICD-10-CM

## 2016-08-17 DIAGNOSIS — R911 Solitary pulmonary nodule: Secondary | ICD-10-CM | POA: Diagnosis not present

## 2016-08-17 DIAGNOSIS — J449 Chronic obstructive pulmonary disease, unspecified: Secondary | ICD-10-CM | POA: Diagnosis not present

## 2016-08-17 MED ORDER — BUDESONIDE-FORMOTEROL FUMARATE 160-4.5 MCG/ACT IN AERO: 2.0000 | INHALATION_SPRAY | Freq: Two times a day (BID) | RESPIRATORY_TRACT | 11 refills | Status: AC

## 2016-08-17 NOTE — Patient Instructions (Signed)
   Call me if you continue to cough up blood in your mucus.  Let me know if you have any new breathing problems before your next appointment.  I'm not changing your inhaler regimen today.  We will touch base after your CT scan is complete.  TESTS ORDERED: 1. CT CHEST W/O ASAP

## 2016-08-17 NOTE — Progress Notes (Signed)
Subjective:    Patient ID: Sharon [name], female    DOB: [birth year], 61 y.o.   MRN: [id number]  C.C.:  Follow-up for Severe COPD, Small Cell Lung Cancer, & Tobacco Use Disorder.  HPI Hemoptysis:  She has coughed up bright red blood-tinged mucus twice. She coughed it up once today and once on Saturday/Sunday. She reports she recently had a direct laryngoscopy with ENT last week. She denies any epistaxis.   Severe COPD: Prescribed Symbicort. She reports she has had voice changes but had a normal report from ENT last week. She reports her voice will intermittently cut out. She reports her breathing is doing "fine". She denies any persistent cough or wheezing. No exacerbations since last appointment. Rarely needs her rescue inhaler.   Small cell lung cancer: Extensive stage (T2b, N2, M1a). Source was the patient's left lung. Followed by Dr. Julien Nordmann with medical oncology. Treated with chemotherapy for 6 cycles. Currently in observation phase. Also previously treated with prophylactic whole brain radiation therapy in January.  Tobacco use disorder: At last appointment patient was continuing to smoke cigarettes intermittently.   Review of Systems She reports previously she did have nausea and emesis after attempting to go back to work 2 months ago. No chest pain, pressure or tightness. She reports intermittent, brief headaches. She is having some mild memory problems after whole brain XRT. No focal weakness, numbness or tingling.    No Known Allergies  Current Outpatient Prescriptions on File Prior to Visit  Medication Sig Dispense Refill  . albuterol (PROVENTIL HFA;VENTOLIN HFA) 108 (90 Base) MCG/ACT inhaler Inhale 1 puff into the lungs every 6 (six) hours as needed for wheezing or shortness of breath.    . budesonide-formoterol (SYMBICORT) 160-4.5 MCG/ACT inhaler Inhale 2 puffs into the lungs 2 (two) times daily. 1 Inhaler 12  . Cholecalciferol (VITAMIN D3) 50000 units CAPS Take 1 tablet by mouth  daily.    Marland Kitchen emollient (BIAFINE) cream Apply topically 2 (two) times daily.    . fluticasone (FLONASE) 50 MCG/ACT nasal spray Place 2 sprays into both nostrils daily as needed.     . meclizine (ANTIVERT) 12.5 MG tablet Take 1-2 tablets (12.5-25 mg total) by mouth 3 (three) times daily as needed for dizziness. 20 tablet 0  . montelukast (SINGULAIR) 10 MG tablet Take 10 mg by mouth daily as needed.     . prochlorperazine (COMPAZINE) 10 MG tablet TAKE 1 TABLET BY MOUTH EVERY 6 HOURS AS NEEDED FOR NAUSEA AND VOMITING 360 tablet 1  . promethazine (PHENERGAN) 25 MG tablet Take 1 tablet (25 mg total) by mouth every 8 (eight) hours as needed for nausea or vomiting. 20 tablet 0   No current facility-administered medications on file prior to visit.     Past Medical History:  Diagnosis Date  . Cancer of right breast ([place]) 2009  . CAP (community acquired pneumonia) 11/06/2015  . COPD (chronic obstructive pulmonary disease) ([place])   . Elevated blood pressure   . Encounter for antineoplastic chemotherapy 01/05/2016  . History of radiation therapy 05/04/16-05/18/16   whole brain 25 Gy in 10 fractions  . Hoarseness of voice    "for the last 2 months" (11/06/2015)  . Lung mass dx'd 10/2015  . Menopause   . Personal history of breast cancer 03/29/2016  . Pre-diabetes   . Small cell lung cancer ([place])   . Vitamin D deficiency     Past Surgical History:  Procedure Laterality Date  . BREAST BIOPSY Right 2009  .  BREAST LUMPECTOMY Right 2009  . UTERINE FIBROID SURGERY  2000  . VAGINAL HYSTERECTOMY  2000  . VESICOVAGINAL FISTULA CLOSURE W/ TAH  2000  . VIDEO BRONCHOSCOPY WITH ENDOBRONCHIAL ULTRASOUND N/A 11/10/2015   Procedure: VIDEO BRONCHOSCOPY WITH ENDOBRONCHIAL ULTRASOUND;  Surgeon: Javier Glazier, MD;  Location: [facility];  Service: Thoracic;  Laterality: N/A;    Family History  Problem Relation Age of Onset  . Adopted: Yes    Social History   Social History  . Marital status: Divorced    Spouse  name: N/A  . Number of children: 3  . 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 Smoking status: Former Smoker    Packs/day: 0.10    Years: 46.00    Types: Cigarettes    Quit date: 11/11/2015  . Smokeless tobacco: Never Used     Comment: Peak rate of 2ppd  . Alcohol use No  . Drug use: Yes    Types: Marijuana     Comment: 11/06/2015 "1-2 times/week when I do smoke; none lately"  . Sexual activity: Yes   Other Topics Concern  . None   Social History Narrative  . None      Objective:   Physical Exam BP 114/60 (BP Location: Left Arm, Patient Position: Sitting, Cuff Size: Large)   Pulse 85   Ht '5\' 6"'$  (1.676 m)   Wt 250 lb 12.8 oz (113.8 kg)   SpO2 96%   BMI 40.48 kg/m   Gen.: Obese female. No acute distress. Comfortable. Integument: No rash or bruising on exposed skin. Warm and dry. HEENT: Mildly abraded nasal mucosa at Kiesselbach's plexus anteriorly in both nares. No obvious source of bleeding otherwise. Mildly enlarged bilateral nasal turbinates. Cardiovascular: Regular rate. Regular rhythm. Normal S1 & S2. Pulmonary: Normal work of breathing on room air. Clear with auscultation. Neurological: No meningismus. Grossly nonfocal. Cranial nerves grossly intact.  PFT 02/11/16: FVC 2.07 L (87%) FEV1 1.42 L (81%) FEV1/FVC 0.69 FEF 25-75 0.87 L (34%) negative bronchodilator response                                                                  DLCO corrected 58% (hgb 12.7) 11/17/15: FVC 2.04 L (56%) FEV1 1.31 L (47%) FEV1/FVC 0.64 FEF 25-75 0.74 L (29%) negative bronchodilator response TLC 4.94 L (92%) RV 134% ERV 13% DLCO corrected 63% (Hgb 13.1)  IMAGING CT CHEST W/ CONTRAST 06/28/16 (personally reviewed by me): Apical predominant mild centrilobular and paraseptal emphysema. No pleural effusion or thickening appreciated. No pathologic mediastinal adenopathy. Pericardial effusion. Streaky linear opacity left upper lobe  consistent with scar formation with a more nodular component measuring up to 10 mm. No other nodule or opacity appreciated.  CT ABDOMEN/PELVIS W/ CONTRAST 06/28/16 (per radiologist):  Fatty enlarged liver. Probable areas of fatty sparing. If further evaluation is desired, MR abdomen without and with contrast is recommended. Right adrenal thickening, characterized as an adenoma on 11/19/2015.  MRI BRAIN W/ & W/O 11/19/15 (per radiologist): Mild subcortical and periventricular T2 & FLAIR hyperintensities likely chronic microvascular ischemic changes. 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 No intracranial metastatic disease is observed.  PET  CT 11/19/15 (per radiologist): Hypermetabolic left upper lobe mass, bilateral mediastinal, subcarinal, & bilateral supraclavicular adenopathy. 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 Hypermetabolism within body of left adrenal gland without definite CT correlate. Hepatic steatosis noted. Right adrenal adenoma noted.  PATHOLOGY EBUS 11/10/15:  Level 4L Small Cell Carcinoma / Level 7 Malignant Cells Present  LABS 11/20/15 Alpha-1 antitrypsin: 209    Assessment & Plan:  61 y.o. female with new hemoptysis and known severe COPD, small cell lung cancer, and tobacco use disorder. The patient's hemoptysis is certainly concerning for possible recurrence of her lung cancer given the nodule noted on her CT imaging. We discussed bronchoscopy with airway inspection but the more prudent course of action would be to evaluate her lung parenchyma. Overall her COPD seems to be well-controlled. Additional potential etiologies would include nasal mucosal irritation after her direct laryngoscopy by ENT last week. I instructed the patient contact me if she had any new questions or concerns.  1. Hemoptysis: Patient to notify me for any further hemoptysis. Checking CT chest without contrast as soon as possible. May  require bronchoscopy with airway inspection depending upon results and/or persistence of hemoptysis. 2. Left upper lobe nodule: Checking CT chest without contrast as soon as possible. 3. Severe COPD: Well controlled. Continuing on Symbicort 160/4.5. No changes. 4. Small cell lung cancer: Following with medical oncology & radiation oncology. Checking CT chest without contrast. 5. Tobacco use disorder: Not addressed at this appointment. 6. Health maintenance: Status post Pneumovax 16 November 2015 & Influenza October 2017. Administering Prevnar vaccine today. 7. Follow-up: Return to clinic in 6 weeks or sooner if needed.   Sonia Baller Ashok Cordia, M.D. [facility] Pager:  [phone] After 3pm or if no response, call [phone] 2:51 PM 08/17/16

## 2016-08-18 ENCOUNTER — Telehealth: Payer: Self-pay | Admitting: Pulmonary Disease

## 2016-08-18 NOTE — Telephone Encounter (Signed)
Patient personally contacted by me this evening related the results of her chest CT scan. Enlargement in the left upper lobe nodule is certainly concerning for malignancy. I did discuss this with Dr. Julien Nordmann and he has already placed a consult and radiation oncology for localized treatment to the enlarging lesion. The patient has had no further hemoptysis since her appointment yesterday. I instructed her to contact me if she developed any further hemoptysis. We will hold off on bronchoscopy and airway inspection at this time.

## 2016-08-18 NOTE — Telephone Encounter (Signed)
Received call report on the pt's ct chest  Impression:  IMPRESSION: 1. Continued enlargement of LEFT upper lobe pulmonary nodule concerning for lung cancer recurrence. Recommend tissue sampling or FDG PET scan. 2. Increase in linear thickening in the LEFT upper lobe superior to the enlarging nodule is also concerning for malignancy. 3. No mediastinal adenopathy. These results will be called to the ordering clinician or representative by the Radiologist Assistant, and communication documented in the PACS or zVision Dashboard.

## 2016-08-20 NOTE — Progress Notes (Signed)
Thoracic Location of Tumor / Histology: Small cell lung cancer - [place] LUL pulmonary nodule with hemoptysis. CT scan from 08/18/16 showed "Increase in linear thickening in the LEFT upper lobe superior to the enlarging nodule is also concerning for malignancy."  Patient presented  months ago with symptoms of: coughed up bright red blood-tinged mucus twice on Sunday, 08/15/16 and Tuesday, 08/17/16.  Biopsies revealed:   11/10/15 Diagnosis FINE NEEDLE ASPIRATION, EBUS 4L (SPECIMEN 1 OF 2. COLLECTED 11/10/15): MALIGNANT CELLS PRESENT, SEE COMMENT.  Diagnosis FINE NEEDLE ASPIRATION, ENDOSCOPIC SPECIMEN B, EBUS 7 NODE,(SPECIMEN 2 OF 2, COLLECTED ON 11/05/15) MALIGNANT CELLS PRESENT, SEE COMMENT.  Tobacco/Marijuana/Snuff/ETOH use: former smoker, smoked for 46 years.  Occasional marijuana use. Denies ETOH.   Past/Anticipated interventions by cardiothoracic surgery, if any: no  Past/Anticipated interventions by medical oncology, if any: Systemic chemotherapy with cisplatin 60 MG/M2 on day 1 and etoposide at 120 MG/M2 on days 1, 2 and 3 with Neulasta support on day 4. She is status post 6 cycles.  Signs/Symptoms  Weight changes, if any: no  Respiratory complaints, if any: reports having an occasional dry cough and sometimes is short of breath.  Hemoptysis, if any: yes  Pain issues, if any:  no  SAFETY ISSUES:  Prior radiation? 05/04/16-05/18/16 whole brain 25 Gy in 10 fractions.  Also had right breast radiation 10 years ago.   Pacemaker/ICD? no   Possible current pregnancy?no  Is the patient on methotrexate? no  Current Complaints / other details:    BP 130/69 (BP Location: Left Arm, Patient Position: Sitting)   Pulse 73   Temp 98.1 F (36.7 C) (Oral)   Ht '5\' 6"'$  (1.676 m)   Wt 251 lb (113.9 kg)   SpO2 99%   BMI 40.51 kg/m    Wt Readings from Last 3 Encounters:  08/23/16 251 lb (113.9 kg)  08/17/16 250 lb 12.8 oz (113.8 kg)  08/05/16 250 lb 12.8 oz (113.8 kg)

## 2016-08-23 ENCOUNTER — Encounter: Payer: Self-pay | Admitting: Radiation Oncology

## 2016-08-23 ENCOUNTER — Ambulatory Visit
Admission: RE | Admit: 2016-08-23 | Discharge: 2016-08-23 | Disposition: A | Discharge status: Home / Self Care | Payer: 59 | Source: Ambulatory Visit | Attending: Radiation Oncology | Admitting: Radiation Oncology

## 2016-08-23 DIAGNOSIS — C3492 Malignant neoplasm of unspecified part of left bronchus or lung: Secondary | ICD-10-CM

## 2016-08-23 DIAGNOSIS — Z510 Encounter for antineoplastic radiation therapy: Secondary | ICD-10-CM | POA: Diagnosis not present

## 2016-08-23 NOTE — Progress Notes (Signed)
Please see the Nurse Progress Note in the MD Initial Consult Encounter for this patient. 

## 2016-08-23 NOTE — Progress Notes (Signed)
  Radiation Oncology         [phone] ________________________________  Name: Sharon [name] MRN: [id number]  Date: 08/23/2016  DOB: 1955/11/17  SIMULATION AND TREATMENT PLANNING NOTE    ICD-9-CM ICD-10-CM   1. Primary small cell carcinoma of left lung ([name]) 162.9 C34.92     DIAGNOSIS:  Extensive stage (T2b, N2, M1 ) small cell lung cancer presented with large left upper lobe mass and mediastinal lymphadenopathy  NARRATIVE:  The patient was brought to the [facility].  Identity was confirmed.  All relevant records and images related to the planned course of therapy were reviewed.  The patient freely provided informed written consent to proceed with treatment after reviewing the details related to the planned course of therapy. The consent form was witnessed and verified by the simulation staff.  Then, the patient was set-up in a stable reproducible  supine position for radiation therapy.  CT images were obtained.  Surface markings were placed.  The CT images were loaded into the planning software.  Then the target and avoidance structures were contoured.  Treatment planning then occurred.  The radiation prescription was entered and confirmed.  Then, I designed and supervised the construction of a total of 5 medically necessary complex treatment devices.  I have requested : 3D Simulation  I have requested a DVH of the following structures: heart, lungs, spinal cord, GTV, PTV.  I have ordered:dose calc.  PLAN:  The patient will receive 35 Gy in 14 fractions.  -----------------------------------  Blair Promise, PhD, MD  This document serves as a record of services personally performed by Gery Pray, MD. It was created on his behalf by Darcus Austin, a trained medical scribe. The creation of this record is based on the scribe's personal observations and the provider's statements to them. This document has been checked and approved by the attending provider.

## 2016-08-23 NOTE — Progress Notes (Signed)
Radiation Oncology         [phone] ________________________________  Name: DONJA TIPPING MRN: [id number]  Date: 08/23/2016  DOB: 1955-05-19  Re-Consultation Visit Note  CC: [name], PA  Curt Bears, MD    ICD-9-CM ICD-10-CM   1. Primary small cell carcinoma of left lung ([name]) 162.9 C34.92     Diagnosis:   The encounter diagnosis was Primary small cell carcinoma of left lung ([place]). Extensive stage (T2b, N2, M1 ) small cell lung cancer presented with large left upper lobe mass and mediastinal lymphadenopathy  Interval Since Last Radiation:  3 months 1 week  05/04/16 - 05/18/16 : Brain treated to 25 Gy in 10 fractions, Prophylactic treatment  03/15/2008 - 05/15/2008: 54 Gy to the right breast  Narrative:  The patient returns today for a re-consultation regarding her small cell lung cancer of the left lung.  The patient is status 6 cycles of systemic chemotherapy with cisplatin 60 MG/M2 on day 1 and etoposide at 120 MG/M2 on days 1, 2 and 3 with Neulasta support on day 4 in November 2017.  The patient had a CT of the chest w/o contrast on 08/18/16 due to a chronic cough and hemoptysis for two days. This showed continued enlargement of a left upper lobe pulmonary nodule concerning for lung cancer recurrence, an increase in linear thickening in the left upper lobe superior to the enlarging nodule also concerning for malignancy, and no mediastinal adenopathy.  Dr. Julien Nordmann was notified and recommended radiation for localized treatment to the enlarging lesion.  On ROS: On review of systems, the patient denies any chest pain, fevers, chills, night sweats, unintended weight changes, any bowel or bladder disturbances, abdominal pain, nausea, or vomiting. She denies any new musculoskeletal or joint aches or pains, new skin lesions or concerns. She reports an occasional dry cough, SOB, and hemoptysis. It was a small amount of hemoptysis and it was only on 2 occasions. The patient  continues to have vertigo and sees an ENT regarding this. A complete review of systems is obtained and is otherwise negative.  ALLERGIES:  has No Known Allergies.  Meds: Current Outpatient Prescriptions  Medication Sig Dispense Refill  . albuterol (PROVENTIL HFA;VENTOLIN HFA) 108 (90 Base) MCG/ACT inhaler Inhale 1 puff into the lungs every 6 (six) hours as needed for wheezing or shortness of breath.    . budesonide-formoterol (SYMBICORT) 160-4.5 MCG/ACT inhaler Inhale 2 puffs into the lungs 2 (two) times daily. 1 Inhaler 11  . Cholecalciferol (VITAMIN D3) 50000 units CAPS Take 1 tablet by mouth daily.    . fluticasone (FLONASE) 50 MCG/ACT nasal spray Place 2 sprays into both nostrils daily as needed.     . montelukast (SINGULAIR) 10 MG tablet Take 10 mg by mouth daily as needed.     . prochlorperazine (COMPAZINE) 10 MG tablet TAKE 1 TABLET BY MOUTH EVERY 6 HOURS AS NEEDED FOR NAUSEA AND VOMITING 360 tablet 1  . promethazine (PHENERGAN) 25 MG tablet Take 1 tablet (25 mg total) by mouth every 8 (eight) hours as needed for nausea or vomiting. 20 tablet 0  . emollient (BIAFINE) cream Apply topically 2 (two) times daily.    . meclizine (ANTIVERT) 12.5 MG tablet Take 1-2 tablets (12.5-25 mg total) by mouth 3 (three) times daily as needed for dizziness. (Patient not taking: Reported on 08/23/2016) 20 tablet 0   No current facility-administered medications for this encounter.     Physical Findings: The patient is in no acute distress. Patient is  alert and oriented.  height is '5\' 6"'$  (1.676 m) and weight is 251 lb (113.9 kg). Her oral temperature is 98.1 F (36.7 C). Her blood pressure is 130/69 and her pulse is 73. Her oxygen saturation is 99%.  Lungs are clear to auscultation bilaterally. Heart has regular rate and rhythm. No palpable supraclavicular or axillary adenopathy.   Lab Findings: Lab Results  Component Value Date   WBC 8.5 06/28/2016   HGB 14.0 06/28/2016   HCT 41.5 06/28/2016   MCV  98.1 06/28/2016   PLT 312 06/28/2016    Radiographic Findings: Ct Chest Wo Contrast  Result Date: 08/18/2016 CLINICAL DATA:  Pt c/o chronic cough but as seen blood in sputum over the past two days, ex smoker, small cell lung ca., hx breast ca (rt breast), copd, cap she EXAM: CT CHEST WITHOUT CONTRAST TECHNIQUE: Multidetector CT imaging of the chest was performed following the standard protocol without IV contrast. COMPARISON:  CT 06/28/2016, 03/26/2016 FINDINGS: Cardiovascular: Coronary artery calcification and aortic atherosclerotic calcification. Mediastinum/Nodes: No axillary or supraclavicular lymphadenopathy. No mediastinal or hilar lymphadenopathy. Lungs/Pleura: 17 mm nodule in the LEFT upper lobe is increased from 10 mm (06/28/2016). Peripheral to this nodule nodule there is a band of linear thickening which is also increased measuring 9 mm in thickness compared to 6 mm (image 42, series 3). The upper lobe scarring is present on CT of 03/26/2016. The more central nodule is new from the this more remote scan. Upper Abdomen: Limited view of the liver, kidneys, pancreas are unremarkable. Normal adrenal glands. Musculoskeletal: No aggressive osseous lesion. IMPRESSION: 1. Continued enlargement of LEFT upper lobe pulmonary nodule concerning for lung cancer recurrence. Recommend tissue sampling or FDG PET scan. 2. Increase in linear thickening in the LEFT upper lobe superior to the enlarging nodule is also concerning for malignancy. 3. No mediastinal adenopathy. These results will be called to the ordering clinician or representative by the Radiologist Assistant, and communication documented in the PACS or zVision Dashboard. Electronically Signed   By: Suzy Bouchard M.D.   On: 08/18/2016 09:04    Impression: Extensive stage (T2b, N2, M1 ) small cell lung cancer presented with large left upper lobe mass and mediastinal lymphadenopathy  The patient's most recent CT of the chest on 08/18/16 was worrisome  for lung cancer recurrence of the left upper lung. The patient did not have previous treatment to the chest other than radiation to the right breast completed in January 2010 for right breast cancer. The patient completed 6 cycles of systemic chemotherapy in November 2017 and had a good response to treatment on CT scan in December 2017. The patient appears to be a candidate for palliative radiation to the left lung given she has extensive stage disease, cough, and hemoptysis.  We discussed her diagnosis and stage. We discussed definitive radiation in the management of her disease. We discussed 3 weeks of treatment as an outpatient. We discussed the process of CT simulation and the placement of tattoos. We discussed dysphagia, weight loss, skin irritation, and fatigue as the acute side effects of radiation. We discussed damage to critical normal structures in the chest as well as the spinal cord as possible but improbable. The patient agreed to proceed with treatment. The patient signed a consent form and a copy was placed in her medical chart.  Of note: The patient continues to have problems with vertigo and is not ready to resume work. She sees an ENT at [facility] concerning this. We previously discussed  consideration of a brain MRI as the last study was in December 2017, but she decided to hold off on this study as it was difficult for her to tolerate previously.  Plan: CT simulation is scheduled later today at 1PM. We anticipate beginning treatment earlier next week.  -----------------------------------  Blair Promise, PhD, MD  This document serves as a record of services personally performed by Gery Pray, MD. It was created on his behalf by Darcus Austin, a trained medical scribe. The creation of this record is based on the scribe's personal observations and the provider's statements to them. This document has been checked and approved by the attending provider.

## 2016-08-26 DIAGNOSIS — Z510 Encounter for antineoplastic radiation therapy: Secondary | ICD-10-CM | POA: Diagnosis not present

## 2016-09-01 ENCOUNTER — Ambulatory Visit
Admission: RE | Admit: 2016-09-01 | Discharge: 2016-09-01 | Disposition: A | Discharge status: Home / Self Care | Payer: 59 | Source: Ambulatory Visit | Attending: Radiation Oncology | Admitting: Radiation Oncology

## 2016-09-01 DIAGNOSIS — C3492 Malignant neoplasm of unspecified part of left bronchus or lung: Secondary | ICD-10-CM

## 2016-09-01 DIAGNOSIS — Z510 Encounter for antineoplastic radiation therapy: Secondary | ICD-10-CM | POA: Diagnosis not present

## 2016-09-01 NOTE — Progress Notes (Signed)
  Radiation Oncology         ([phone]) [phone] ________________________________  Name: Sharon [name] MRN: [id number]  Date: 09/01/2016  DOB: January 22, 1956  Simulation Verification Note    ICD-9-CM ICD-10-CM   1. Primary small cell carcinoma of left lung ([name]) 162.9 C34.92     Status: outpatient  NARRATIVE: The patient was brought to the treatment unit and placed in the planned treatment position. The clinical setup was verified. Then port films were obtained and uploaded to the radiation oncology medical record software.  The treatment beams were carefully compared against the planned radiation fields. The position location and shape of the radiation fields was reviewed. They targeted volume of tissue appears to be appropriately covered by the radiation beams. Organs at risk appear to be excluded as planned.  Based on my personal review, I approved the simulation verification. The patient's treatment will proceed as planned.  -----------------------------------  Blair Promise, PhD, MD

## 2016-09-02 ENCOUNTER — Ambulatory Visit
Admission: RE | Admit: 2016-09-02 | Discharge: 2016-09-02 | Disposition: A | Discharge status: Home / Self Care | Payer: 59 | Source: Ambulatory Visit | Attending: Radiation Oncology | Admitting: Radiation Oncology

## 2016-09-02 ENCOUNTER — Ambulatory Visit: Payer: 59

## 2016-09-02 DIAGNOSIS — Z510 Encounter for antineoplastic radiation therapy: Secondary | ICD-10-CM | POA: Diagnosis not present

## 2016-09-03 ENCOUNTER — Ambulatory Visit
Admission: RE | Admit: 2016-09-03 | Discharge: 2016-09-03 | Disposition: A | Discharge status: Home / Self Care | Payer: 59 | Source: Ambulatory Visit | Attending: Radiation Oncology | Admitting: Radiation Oncology

## 2016-09-03 ENCOUNTER — Ambulatory Visit: Payer: 59

## 2016-09-03 DIAGNOSIS — Z510 Encounter for antineoplastic radiation therapy: Secondary | ICD-10-CM | POA: Diagnosis not present

## 2016-09-06 ENCOUNTER — Ambulatory Visit
Admission: RE | Admit: 2016-09-06 | Discharge: 2016-09-06 | Disposition: A | Discharge status: Home / Self Care | Payer: 59 | Source: Ambulatory Visit | Attending: Radiation Oncology | Admitting: Radiation Oncology

## 2016-09-06 ENCOUNTER — Ambulatory Visit: Payer: 59

## 2016-09-06 DIAGNOSIS — Z510 Encounter for antineoplastic radiation therapy: Secondary | ICD-10-CM | POA: Diagnosis not present

## 2016-09-07 ENCOUNTER — Ambulatory Visit
Admission: RE | Admit: 2016-09-07 | Discharge: 2016-09-07 | Disposition: A | Discharge status: Home / Self Care | Payer: 59 | Source: Ambulatory Visit | Attending: Radiation Oncology | Admitting: Radiation Oncology

## 2016-09-07 ENCOUNTER — Ambulatory Visit: Payer: 59

## 2016-09-07 DIAGNOSIS — C3492 Malignant neoplasm of unspecified part of left bronchus or lung: Secondary | ICD-10-CM

## 2016-09-07 DIAGNOSIS — Z510 Encounter for antineoplastic radiation therapy: Secondary | ICD-10-CM | POA: Diagnosis not present

## 2016-09-07 MED ORDER — BIAFINE EX EMUL
Freq: Once | CUTANEOUS | Status: AC
  Administered 2016-09-07: 14:00:00 via TOPICAL

## 2016-09-07 NOTE — Progress Notes (Signed)
Pt here for patient teaching.  Pt given biafine cream.  Reviewed areas of pertinence such as fatigue, skin changes, throat changes and cough . Pt able to give teach back of applying biafine BID,  to pat skin and use unscented/gentle soap,avoid applying anything to skin within 4 hours of treatment. Pt demonstrated understanding and verbalizes understanding of information given and will contact nursing with any questions or concerns.

## 2016-09-08 ENCOUNTER — Ambulatory Visit
Admission: RE | Admit: 2016-09-08 | Discharge: 2016-09-08 | Disposition: A | Discharge status: Home / Self Care | Payer: 59 | Source: Ambulatory Visit | Attending: Radiation Oncology | Admitting: Radiation Oncology

## 2016-09-08 ENCOUNTER — Ambulatory Visit: Payer: 59

## 2016-09-08 DIAGNOSIS — Z510 Encounter for antineoplastic radiation therapy: Secondary | ICD-10-CM | POA: Diagnosis not present

## 2016-09-08 DIAGNOSIS — C3492 Malignant neoplasm of unspecified part of left bronchus or lung: Secondary | ICD-10-CM

## 2016-09-09 ENCOUNTER — Ambulatory Visit: Payer: 59

## 2016-09-09 ENCOUNTER — Ambulatory Visit
Admission: RE | Admit: 2016-09-09 | Discharge: 2016-09-09 | Disposition: A | Discharge status: Home / Self Care | Payer: 59 | Source: Ambulatory Visit | Attending: Radiation Oncology | Admitting: Radiation Oncology

## 2016-09-09 DIAGNOSIS — C3492 Malignant neoplasm of unspecified part of left bronchus or lung: Secondary | ICD-10-CM

## 2016-09-09 DIAGNOSIS — Z510 Encounter for antineoplastic radiation therapy: Secondary | ICD-10-CM | POA: Diagnosis not present

## 2016-09-10 ENCOUNTER — Ambulatory Visit
Admission: RE | Admit: 2016-09-10 | Discharge: 2016-09-10 | Disposition: A | Discharge status: Home / Self Care | Payer: 59 | Source: Ambulatory Visit | Attending: Radiation Oncology | Admitting: Radiation Oncology

## 2016-09-10 ENCOUNTER — Ambulatory Visit: Payer: 59

## 2016-09-10 DIAGNOSIS — Z510 Encounter for antineoplastic radiation therapy: Secondary | ICD-10-CM | POA: Diagnosis not present

## 2016-09-13 ENCOUNTER — Ambulatory Visit: Payer: 59

## 2016-09-13 ENCOUNTER — Ambulatory Visit
Admission: RE | Admit: 2016-09-13 | Discharge: 2016-09-13 | Disposition: A | Discharge status: Home / Self Care | Payer: 59 | Source: Ambulatory Visit | Attending: Radiation Oncology | Admitting: Radiation Oncology

## 2016-09-13 DIAGNOSIS — C3492 Malignant neoplasm of unspecified part of left bronchus or lung: Secondary | ICD-10-CM

## 2016-09-13 DIAGNOSIS — Z510 Encounter for antineoplastic radiation therapy: Secondary | ICD-10-CM | POA: Diagnosis not present

## 2016-09-14 ENCOUNTER — Ambulatory Visit: Payer: 59

## 2016-09-14 ENCOUNTER — Ambulatory Visit
Admission: RE | Admit: 2016-09-14 | Discharge: 2016-09-14 | Disposition: A | Discharge status: Home / Self Care | Payer: 59 | Source: Ambulatory Visit | Attending: Radiation Oncology | Admitting: Radiation Oncology

## 2016-09-14 ENCOUNTER — Telehealth: Payer: Self-pay | Admitting: Oncology

## 2016-09-14 VITALS — BP 117/82 | HR 101 | Temp 98.2°F | Ht 66.0 in | Wt 246.4 lb

## 2016-09-14 DIAGNOSIS — C3492 Malignant neoplasm of unspecified part of left bronchus or lung: Secondary | ICD-10-CM

## 2016-09-14 DIAGNOSIS — Z510 Encounter for antineoplastic radiation therapy: Secondary | ICD-10-CM | POA: Diagnosis not present

## 2016-09-14 NOTE — Progress Notes (Signed)
  Radiation Oncology         [phone] ________________________________  Name: Sharon [name] MRN: [id number]  Date: 09/14/2016  DOB: 1955/05/18  Weekly Radiation Therapy Management    ICD-9-CM ICD-10-CM   1. 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 . . . . . . . The patient presents for routine under treatment assessment.                                   Sharon [name] has completed 10 fractions to her left lung.  She reports feeling "wobbly' after getting out of her car today.  She said she was wobbly before but now it is getting worse.  She said she feels like she is leaning to the right and is running into things.  She said she is driving and has to keep 2 hands on the wheel otherwise she drifts to one side.  She also said she feels shaky.  She is wondering if she should buy a cane for balance.  She denies having any confusion but does have to search words at times.  She reports having occasional wheezing.  She reports that she feels like she has a lump in her throat. No pain with swallowing or difficulty swallowing foods                                 Set-up films were reviewed.                                 The chart was checked. Physical Findings. . .  height is '5\' 6"'$  (1.676 m) and weight is 246 lb 6.4 oz (111.8 kg). Her oral temperature is 98.2 F (36.8 C). Her blood pressure is 117/82 and her pulse is 101 (abnormal). Her oxygen saturation is 97%. .  The lungs are clear. The heart has a regular rhythm and rate. The neurological examination is nonfocal.  Impression . . . . . . . The patient is tolerating radiation. Plan . . . . . . . . . . . . Continue treatment as planned. I have recommended patient not drive if she feels unsteady behind the wheel. Vital signs show some mild dehydration the patient will force fluids. I have again recommended we repeat her brain MRI in light of her symptoms but she does not  wish to pursue this issue at this time.  ________________________________   Blair Promise, PhD, MD

## 2016-09-14 NOTE — Progress Notes (Signed)
Sharon [name] has completed 10 fractions to her left lung.  She reports feeling "wobbly' after getting out of her car today.  She said she was wobbly before but now it is getting worse.  She said she feels like she is leaning to the right and is running into things.  She said she is driving and has to keep 2 hands on the wheel otherwise she drifts to one side.  She also said she feels shaky.  She is wondering if she should buy a cane for balance.  She denies having any confusion but does have to search words at times.  She reports having occasional wheezing.  She reports that she feels like she has a lump in her throat.  She is coughing with clear sputum.  She denies having hemoptysis.  The skin on her left chest and back are intact.  She is using biafine.  BP 117/82 (BP Location: Left Arm, Patient Position: Standing)   Pulse (!) 101   Temp 98.2 F (36.8 C) (Oral)   Ht '5\' 6"'$  (1.676 m)   Wt 246 lb 6.4 oz (111.8 kg)   SpO2 97%   BMI 39.77 kg/m    Wt Readings from Last 3 Encounters:  09/14/16 246 lb 6.4 oz (111.8 kg)  08/23/16 251 lb (113.9 kg)  08/17/16 250 lb 12.8 oz (113.8 kg)

## 2016-09-14 NOTE — Telephone Encounter (Signed)
Patient left a message saying that her friend will be driving her to treatment.

## 2016-09-14 NOTE — Telephone Encounter (Signed)
Called and advised patient that Dr. Sondra Come does not want her to drive.  Asked if patient would have a ride to treatment tomorrow.  She said no she does not.  Asked if she would call in the morning and let me know if she will have a ride.  Again she said no.  She also said she does not want to cancel her treatment.  She said that she appreciates Dr. Sondra Come recommending not driving but she is going to drive to treatment herself.

## 2016-09-15 ENCOUNTER — Ambulatory Visit
Admission: RE | Admit: 2016-09-15 | Discharge: 2016-09-15 | Disposition: A | Discharge status: Home / Self Care | Payer: 59 | Source: Ambulatory Visit | Attending: Radiation Oncology | Admitting: Radiation Oncology

## 2016-09-15 ENCOUNTER — Ambulatory Visit: Payer: 59

## 2016-09-15 DIAGNOSIS — Z510 Encounter for antineoplastic radiation therapy: Secondary | ICD-10-CM | POA: Diagnosis not present

## 2016-09-15 DIAGNOSIS — C3492 Malignant neoplasm of unspecified part of left bronchus or lung: Secondary | ICD-10-CM

## 2016-09-16 ENCOUNTER — Ambulatory Visit: Payer: 59

## 2016-09-16 ENCOUNTER — Ambulatory Visit
Admission: RE | Admit: 2016-09-16 | Discharge: 2016-09-16 | Disposition: A | Discharge status: Home / Self Care | Payer: 59 | Source: Ambulatory Visit | Attending: Radiation Oncology | Admitting: Radiation Oncology

## 2016-09-16 DIAGNOSIS — Z510 Encounter for antineoplastic radiation therapy: Secondary | ICD-10-CM | POA: Diagnosis not present

## 2016-09-16 DIAGNOSIS — C3492 Malignant neoplasm of unspecified part of left bronchus or lung: Secondary | ICD-10-CM

## 2016-09-17 ENCOUNTER — Ambulatory Visit
Admission: RE | Admit: 2016-09-17 | Discharge: 2016-09-17 | Disposition: A | Discharge status: Home / Self Care | Payer: 59 | Source: Ambulatory Visit | Attending: Radiation Oncology | Admitting: Radiation Oncology

## 2016-09-17 ENCOUNTER — Ambulatory Visit: Payer: 59

## 2016-09-17 DIAGNOSIS — Z510 Encounter for antineoplastic radiation therapy: Secondary | ICD-10-CM | POA: Diagnosis not present

## 2016-09-21 ENCOUNTER — Ambulatory Visit
Admission: RE | Admit: 2016-09-21 | Discharge: 2016-09-21 | Disposition: A | Discharge status: Home / Self Care | Payer: 59 | Source: Ambulatory Visit | Attending: Radiation Oncology | Admitting: Radiation Oncology

## 2016-09-21 ENCOUNTER — Ambulatory Visit: Payer: 59

## 2016-09-21 ENCOUNTER — Encounter: Payer: Self-pay | Admitting: Radiation Oncology

## 2016-09-21 VITALS — BP 118/79 | HR 79 | Temp 99.1°F | Ht 66.0 in | Wt 242.2 lb

## 2016-09-21 DIAGNOSIS — C3492 Malignant neoplasm of unspecified part of left bronchus or lung: Secondary | ICD-10-CM

## 2016-09-21 DIAGNOSIS — Z510 Encounter for antineoplastic radiation therapy: Secondary | ICD-10-CM | POA: Diagnosis not present

## 2016-09-21 NOTE — Progress Notes (Signed)
Sharon [name] has completed 14 fractions to her left lung.  She reports that she wheezes when she lays on her left side which improves with her inhaler.  She reports up thick green, grapelike sputum yesterday.  She reports having an occasional cough that has been mostly dry.  She denies having hemoptysis.  The skin on her left chest is intact.  She has some redness on her left upper back.  She continues to have dizziness and fell yesterday.  She hit her right forehead, bit her tongue and bruised her left knee and ankle. She did not lose consciousness. She called 911 and the EMT's had to get her up.  She said her legs would not work.  She also mentioned that she started taking meclizine yesterday after she fell.  BP 118/79 (BP Location: Right Arm, Patient Position: Sitting)   Pulse 79   Temp 99.1 F (37.3 C) (Oral)   Ht 5\' 6"  (1.676 m)   Wt 242 lb 3.2 oz (109.9 kg)   SpO2 99%   BMI 39.09 kg/m    Wt Readings from Last 3 Encounters:  09/21/16 242 lb 3.2 oz (109.9 kg)  09/14/16 246 lb 6.4 oz (111.8 kg)  08/23/16 251 lb (113.9 kg)

## 2016-09-21 NOTE — Progress Notes (Signed)
  Radiation Oncology         [phone] ________________________________  Name: Sharon [name] MRN: [id number]  Date: 09/21/2016  DOB: 09-14-1955  Weekly Radiation Therapy Management    ICD-9-CM ICD-10-CM   1. 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 . . . . . . . The patient presents for routine under treatment assessment.                                 Sharon [name] has completed 14 fractions to her left lung.  She reports that she wheezes when she lays on her left side which improves with her inhaler.  She reports up thick green, grapelike sputum yesterday.  She reports having an occasional cough that has been mostly dry.  She denies having hemoptysis.  The skin on her left chest is intact.  She has some redness on her left upper back.  She continues to have dizziness and fell yesterday.  She hit her right forehead, bit her tongue and bruised her left knee and ankle. She did not lose consciousness. She called 911 and the EMT's had to get her up.  She said her legs would not work.  She also mentioned that she started taking meclizine yesterday after she fell. She is not driving at the present time in light of her symptoms.                                 Set-up films were reviewed.                                 The chart was checked. Physical Findings. . .  height is 5\' 6"  (1.676 m) and weight is 242 lb 3.2 oz (109.9 kg). Her oral temperature is 99.1 F (37.3 C). Her blood pressure is 118/79 and her pulse is 79. Her oxygen saturation is 99%. Marland Kitchen tHe lungs are clear. The heart has a regular rhythm and rate. She is ambulating on her own at this time. Impression . . . . . . . The patient is tolerating radiation. Plan . . . . . . . . . . . . Routine one-month follow-up. The patient will monitor her temperature. If she has fever she may require antibiotic therapy. She will also continue to monitor her  sputum.  ________________________________   Blair Promise, PhD, MD

## 2016-09-22 ENCOUNTER — Encounter: Payer: Self-pay | Admitting: Radiation Oncology

## 2016-09-22 NOTE — Progress Notes (Signed)
  Radiation Oncology         [phone] ________________________________  Name: Sharon [name] MRN: [id number]  Date: 09/22/2016  DOB: 1955-08-11  End of Treatment Note  Diagnosis:   Extensive stage (T2b, N2, M1 8) small cell lung cancer presented with large left upper lobe mass and mediastinal lymphadenopathy     Indication for treatment:  Palliative       Radiation treatment dates:   09/01/16 - 09/21/16  Site/dose:   Left Lung treated to 35 Gy in 14 fractions  Beams/energy:   3D  //  6X, 10X  Narrative: The patient tolerated radiation treatment relatively well. She denied pain or fatigue throughout. She reported a dry cough. Overall the patient was without complaint.  Plan: The patient has completed radiation treatment. She was given Biafine to apply to the skin within the treatment field. The patient will return to radiation oncology clinic for routine followup in one month. I advised them to call or return sooner if they have any questions or concerns related to their recovery or treatment.  -----------------------------------  Blair Promise, PhD, MD  This document serves as a record of services personally performed by Eppie Gibson, MD. It was created on her behalf by Maryla Morrow, a trained medical scribe. The creation of this record is based on the scribe's personal observations and the provider's statements to them. This document has been checked and approved by the attending provider.

## 2016-09-27 ENCOUNTER — Encounter (HOSPITAL_COMMUNITY): Payer: Self-pay | Admitting: Emergency Medicine

## 2016-09-27 ENCOUNTER — Other Ambulatory Visit (HOSPITAL_BASED_OUTPATIENT_CLINIC_OR_DEPARTMENT_OTHER): Payer: 59

## 2016-09-27 ENCOUNTER — Emergency Department (HOSPITAL_COMMUNITY): Payer: 59

## 2016-09-27 ENCOUNTER — Inpatient Hospital Stay (HOSPITAL_COMMUNITY)
Admission: EM | Admit: 2016-09-27 | Discharge: 2016-10-01 | DRG: 054 | Disposition: A | Discharge status: Home / Self Care | Payer: 59 | Attending: Internal Medicine | Admitting: Internal Medicine

## 2016-09-27 ENCOUNTER — Telehealth: Payer: Self-pay | Admitting: Medical Oncology

## 2016-09-27 DIAGNOSIS — D72829 Elevated white blood cell count, unspecified: Secondary | ICD-10-CM | POA: Diagnosis present

## 2016-09-27 DIAGNOSIS — F40240 Claustrophobia: Secondary | ICD-10-CM | POA: Diagnosis present

## 2016-09-27 DIAGNOSIS — T380X5A Adverse effect of glucocorticoids and synthetic analogues, initial encounter: Secondary | ICD-10-CM | POA: Diagnosis present

## 2016-09-27 DIAGNOSIS — G4089 Other seizures: Secondary | ICD-10-CM | POA: Diagnosis present

## 2016-09-27 DIAGNOSIS — E559 Vitamin D deficiency, unspecified: Secondary | ICD-10-CM | POA: Diagnosis present

## 2016-09-27 DIAGNOSIS — R569 Unspecified convulsions: Secondary | ICD-10-CM | POA: Diagnosis not present

## 2016-09-27 DIAGNOSIS — C7931 Secondary malignant neoplasm of brain: Principal | ICD-10-CM | POA: Diagnosis present

## 2016-09-27 DIAGNOSIS — C3492 Malignant neoplasm of unspecified part of left bronchus or lung: Secondary | ICD-10-CM | POA: Diagnosis not present

## 2016-09-27 DIAGNOSIS — Z9221 Personal history of antineoplastic chemotherapy: Secondary | ICD-10-CM

## 2016-09-27 DIAGNOSIS — D496 Neoplasm of unspecified behavior of brain: Secondary | ICD-10-CM

## 2016-09-27 DIAGNOSIS — J449 Chronic obstructive pulmonary disease, unspecified: Secondary | ICD-10-CM | POA: Diagnosis present

## 2016-09-27 DIAGNOSIS — Z79899 Other long term (current) drug therapy: Secondary | ICD-10-CM

## 2016-09-27 DIAGNOSIS — Z923 Personal history of irradiation: Secondary | ICD-10-CM

## 2016-09-27 DIAGNOSIS — I10 Essential (primary) hypertension: Secondary | ICD-10-CM | POA: Diagnosis present

## 2016-09-27 DIAGNOSIS — C3412 Malignant neoplasm of upper lobe, left bronchus or lung: Secondary | ICD-10-CM | POA: Diagnosis present

## 2016-09-27 DIAGNOSIS — Z7951 Long term (current) use of inhaled steroids: Secondary | ICD-10-CM

## 2016-09-27 DIAGNOSIS — R9401 Abnormal electroencephalogram [EEG]: Secondary | ICD-10-CM | POA: Diagnosis present

## 2016-09-27 DIAGNOSIS — Z9181 History of falling: Secondary | ICD-10-CM

## 2016-09-27 DIAGNOSIS — G936 Cerebral edema: Secondary | ICD-10-CM | POA: Diagnosis present

## 2016-09-27 DIAGNOSIS — Z87891 Personal history of nicotine dependence: Secondary | ICD-10-CM

## 2016-09-27 DIAGNOSIS — R410 Disorientation, unspecified: Secondary | ICD-10-CM | POA: Diagnosis present

## 2016-09-27 DIAGNOSIS — Z853 Personal history of malignant neoplasm of breast: Secondary | ICD-10-CM

## 2016-09-27 DIAGNOSIS — Z8701 Personal history of pneumonia (recurrent): Secondary | ICD-10-CM

## 2016-09-27 DIAGNOSIS — Z90710 Acquired absence of both cervix and uterus: Secondary | ICD-10-CM

## 2016-09-27 HISTORY — DX: Essential (primary) hypertension: I10

## 2016-09-27 LAB — COMPREHENSIVE METABOLIC PANEL
ALBUMIN: 4.1 g/dL (ref 3.5–5.0)
ALT: 34 U/L (ref 0–55)
ALT: 36 U/L (ref 14–54)
ANION GAP: 10 meq/L (ref 3–11)
ANION GAP: 8 (ref 5–15)
AST: 27 U/L (ref 5–34)
AST: 34 U/L (ref 15–41)
Albumin: 3.6 g/dL (ref 3.5–5.0)
Alkaline Phosphatase: 80 U/L (ref 40–150)
Alkaline Phosphatase: 79 U/L (ref 38–126)
BILIRUBIN TOTAL: 0.5 mg/dL (ref 0.3–1.2)
BUN: 11.7 mg/dL (ref 7.0–26.0)
BUN: 12 mg/dL (ref 6–20)
CHLORIDE: 103 meq/L (ref 98–109)
CHLORIDE: 103 mmol/L (ref 101–111)
CO2: 24 meq/L (ref 22–29)
CO2: 26 mmol/L (ref 22–32)
Calcium: 10 mg/dL (ref 8.4–10.4)
Calcium: 9.6 mg/dL (ref 8.9–10.3)
Creatinine, Ser: 0.75 mg/dL (ref 0.44–1.00)
Creatinine: 0.8 mg/dL (ref 0.6–1.1)
EGFR: 77 mL/min/{1.73_m2} — AB (ref >90)
GFR calc Af Amer: >60 mL/min (ref >60)
Glucose, Bld: 141 mg/dL — ABNORMAL HIGH (ref 65–99)
Glucose: 177 mg/dl — ABNORMAL HIGH (ref 70–140)
POTASSIUM: 3.8 mmol/L (ref 3.5–5.1)
Potassium: 3.9 mEq/L (ref 3.5–5.1)
Sodium: 137 mEq/L (ref 136–145)
Sodium: 137 mmol/L (ref 135–145)
TOTAL PROTEIN: 7.5 g/dL (ref 6.5–8.1)
Total Bilirubin: 0.38 mg/dL (ref 0.20–1.20)
Total Protein: 7.1 g/dL (ref 6.4–8.3)

## 2016-09-27 LAB — I-STAT CHEM 8, ED
BUN: 11 mg/dL (ref 6–20)
CALCIUM ION: 1.26 mmol/L (ref 1.15–1.40)
CREATININE: 0.7 mg/dL (ref 0.44–1.00)
Chloride: 99 mmol/L — ABNORMAL LOW (ref 101–111)
GLUCOSE: 140 mg/dL — AB (ref 65–99)
HCT: 41 % (ref 36.0–46.0)
HEMOGLOBIN: 13.9 g/dL (ref 12.0–15.0)
POTASSIUM: 3.8 mmol/L (ref 3.5–5.1)
Sodium: 138 mmol/L (ref 135–145)
TCO2: 28 mmol/L (ref 0–100)

## 2016-09-27 LAB — PROTIME-INR
INR: 0.99
PROTHROMBIN TIME: 13.1 s (ref 11.4–15.2)

## 2016-09-27 LAB — CBC
HCT: 39.4 % (ref 36.0–46.0)
Hemoglobin: 13.6 g/dL (ref 12.0–15.0)
MCH: 33.3 pg (ref 26.0–34.0)
MCHC: 34.5 g/dL (ref 30.0–36.0)
MCV: 96.3 fL (ref 78.0–100.0)
Platelets: 301 10*3/uL (ref 150–400)
RBC: 4.09 MIL/uL (ref 3.87–5.11)
RDW: 13.5 % (ref 11.5–15.5)
WBC: 10.8 10*3/uL — ABNORMAL HIGH (ref 4.0–10.5)

## 2016-09-27 LAB — DIFFERENTIAL
BASOS ABS: 0 10*3/uL (ref 0.0–0.1)
BASOS PCT: 0 %
EOS ABS: 0.2 10*3/uL (ref 0.0–0.7)
EOS PCT: 2 %
Lymphocytes Relative: 17 %
Lymphs Abs: 1.8 10*3/uL (ref 0.7–4.0)
MONOS PCT: 10 %
Monocytes Absolute: 1.1 10*3/uL — ABNORMAL HIGH (ref 0.1–1.0)
NEUTROS PCT: 71 %
Neutro Abs: 7.7 10*3/uL (ref 1.7–7.7)

## 2016-09-27 LAB — CBC WITH DIFFERENTIAL/PLATELET
BASO%: 0.2 % (ref 0.0–2.0)
Basophils Absolute: 0 10*3/uL (ref 0.0–0.1)
EOS%: 1.3 % (ref 0.0–7.0)
Eosinophils Absolute: 0.1 10*3/uL (ref 0.0–0.5)
HCT: 40.2 % (ref 34.8–46.6)
HGB: 13.4 g/dL (ref 11.6–15.9)
LYMPH%: 14.7 % (ref 14.0–49.7)
MCH: 32.8 pg (ref 25.1–34.0)
MCHC: 33.3 g/dL (ref 31.5–36.0)
MCV: 98.3 fL (ref 79.5–101.0)
MONO#: 0.6 10*3/uL (ref 0.1–0.9)
MONO%: 5.6 % (ref 0.0–14.0)
NEUT#: 7.9 10*3/uL — ABNORMAL HIGH (ref 1.5–6.5)
NEUT%: 78.2 % — AB (ref 38.4–76.8)
PLATELETS: 279 10*3/uL (ref 145–400)
RBC: 4.09 10*6/uL (ref 3.70–5.45)
RDW: 13.5 % (ref 11.2–14.5)
WBC: 10.1 10*3/uL (ref 3.9–10.3)
lymph#: 1.5 10*3/uL (ref 0.9–3.3)

## 2016-09-27 LAB — CBG MONITORING, ED: Glucose-Capillary: 96 mg/dL (ref 65–99)

## 2016-09-27 LAB — I-STAT TROPONIN, ED: TROPONIN I, POC: 0 ng/mL (ref 0.00–0.08)

## 2016-09-27 LAB — APTT: APTT: 27 s (ref 24–36)

## 2016-09-27 MED ORDER — SODIUM CHLORIDE 0.9 % IV SOLN
INTRAVENOUS | Status: DC
  Administered 2016-09-27 – 2016-09-28 (×2): via INTRAVENOUS

## 2016-09-27 MED ORDER — GADOBENATE DIMEGLUMINE 529 MG/ML IV SOLN
20.0000 mL | Freq: Once | INTRAVENOUS | Status: AC | PRN
  Administered 2016-09-27: 20 mL via INTRAVENOUS

## 2016-09-27 MED ORDER — DEXAMETHASONE SODIUM PHOSPHATE 10 MG/ML IJ SOLN
10.0000 mg | Freq: Once | INTRAMUSCULAR | Status: AC
  Administered 2016-09-27: 10 mg via INTRAVENOUS
  Filled 2016-09-27: qty 1

## 2016-09-27 MED ORDER — LEVETIRACETAM 500 MG/5ML IV SOLN
1000.0000 mg | Freq: Once | INTRAVENOUS | Status: AC
  Administered 2016-09-27: 1000 mg via INTRAVENOUS
  Filled 2016-09-27: qty 10

## 2016-09-27 NOTE — Telephone Encounter (Addendum)
Just finished radiation. She has had several ? Seizures. One week ago she had first ? seizure-her son found her in a  pool of blood ( she bit her tongue)  Her right hand was uncontrollable. EMS came and she could not use her legs. She refused to go because she became lucid and did not want to go to [employer]. She is having trouble talking. Her Last episode is now where her hands is shaking and having trouble holding the phone and she is shuffling.  Pt stated that [facility] is aware of one episode. Per Julien Nordmann I told pt to go to [facility] ED. She is with her friend Bethena Roys who is driving her.

## 2016-09-27 NOTE — ED Notes (Signed)
Bed: WA06 Expected date:  Expected time:  Means of arrival:  Comments: Hold for triage 

## 2016-09-27 NOTE — ED Triage Notes (Signed)
Per patient, states seizure activity after radiation-states last week she was found by son down-states she had bit tongue and want incontinent of urine-Dr [facility] nurse states she called today stating she was having another episode-not able to get her words out, stumbling-states they told her to come here for neuro work up

## 2016-09-27 NOTE — ED Provider Notes (Signed)
[facility] DEPT Provider Note   CSN: [id number] Arrival date & time: 09/27/16  1530     History   Chief Complaint Chief Complaint  Patient presents with  . possible seizures    HPI Sharon [name] is a 61 y.o. female.  61 y/o female w/ h/o lung ca and receiving radiation tz presents w/ possible seizure activity x several days Pt has no prior h/o of seziures and had a neg brain mri in 2017 Sx describes as whole body shaking slightly worse on the right side Had an epsidoe a weak ago where she threw herself across the room and ems was called but she didn't want to be transported No recent head, fever, neck pain, n/v No new medication changes Sx wax and wane with nothing making them better or worse Was seen at the cancer center today and sent here for eval      Past Medical History:  Diagnosis Date  . Cancer of right breast ([place]) 2009  . CAP (community acquired pneumonia) 11/06/2015  . COPD (chronic obstructive pulmonary disease) ([place])   . Elevated blood pressure   . Encounter for antineoplastic chemotherapy 01/05/2016  . History of radiation therapy 05/04/16-05/18/16   whole brain 25 Gy in 10 fractions  . Hoarseness of voice    "for the last 2 months" (11/06/2015)  . Lung mass dx'd 10/2015  . Menopause   . Personal history of breast cancer 03/29/2016  . Pre-diabetes   . Small cell lung cancer ([place])   . Vitamin D deficiency     Patient Active Problem List   Diagnosis Date Noted  . Hemoptysis 08/17/2016  . Personal history of breast cancer 03/29/2016  . Encounter for antineoplastic chemotherapy 01/05/2016  . Nausea without vomiting 12/15/2015  . Primary small cell carcinoma of left lung ([place]) 11/18/2015  . Mediastinal adenopathy   . Breast cancer ([place]) 07/18/2012  . COPD, severe ([place]) 08/18/2011  . Smoking 08/18/2011    Past Surgical History:  Procedure Laterality Date  . BREAST BIOPSY Right 2009  . BREAST LUMPECTOMY Right 2009  . UTERINE FIBROID SURGERY  2000    . VAGINAL HYSTERECTOMY  2000  . VESICOVAGINAL FISTULA CLOSURE W/ TAH  2000  . VIDEO BRONCHOSCOPY WITH ENDOBRONCHIAL ULTRASOUND N/A 11/10/2015   Procedure: VIDEO BRONCHOSCOPY WITH ENDOBRONCHIAL ULTRASOUND;  Surgeon: Javier Glazier, MD;  Location: [facility];  Service: Thoracic;  Laterality: N/A;    OB History    No data available       Home Medications    Prior to Admission medications   Medication Sig Start Date End Date Taking? Authorizing Provider  albuterol (PROVENTIL HFA;VENTOLIN HFA) 108 (90 Base) MCG/ACT inhaler Inhale 1 puff into the lungs every 6 (six) hours as needed for wheezing or shortness of breath.   Yes [provider]  budesonide-formoterol (SYMBICORT) 160-4.5 MCG/ACT inhaler Inhale 2 puffs into the lungs 2 (two) times daily. 08/17/16  Yes Javier Glazier, MD  Cholecalciferol (VITAMIN D3) 50000 units CAPS Take 1 tablet by mouth daily. 07/21/16 11/18/16 Yes [provider]  emollient (BIAFINE) cream Apply topically 2 (two) times daily.   Yes [provider]  meclizine (ANTIVERT) 12.5 MG tablet Take 1-2 tablets (12.5-25 mg total) by mouth 3 (three) times daily as needed for dizziness. 06/05/16  Yes Virgel Manifold, MD  prochlorperazine (COMPAZINE) 10 MG tablet TAKE 1 TABLET BY MOUTH EVERY 6 HOURS AS NEEDED FOR NAUSEA AND VOMITING 03/12/16  Yes Curt Bears, MD  UNABLE TO FIND  Take 10 mLs by mouth daily as needed. Med Name: CBD Oil   Yes [provider]    Family History Family History  Problem Relation Age of Onset  . Adopted: Yes    Social History Social History  Substance Use Topics  . Smoking status: Former Smoker    Packs/day: 0.10    Years: 46.00    Types: Cigarettes    Quit date: 11/11/2015  . Smokeless tobacco: Never Used     Comment: Peak rate of 2ppd  . Alcohol use No     Allergies   Patient has no known allergies.   Review of Systems Review of Systems  All other systems reviewed and are  negative.    Physical Exam Updated Vital Signs BP (!) 175/85 (BP Location: Left Arm)   Pulse (!) 108   Temp 98.5 F (36.9 C) (Oral)   Resp 16   SpO2 93%   Physical Exam  Constitutional: She is oriented to person, place, and time. She appears well-developed and well-nourished.  Non-toxic appearance. No distress.  HENT:  Head: Normocephalic and atraumatic.  Eyes: Conjunctivae, EOM and lids are normal. Pupils are equal, round, and reactive to light.  Neck: Normal range of motion. Neck supple. No tracheal deviation present. No thyroid mass present.  Cardiovascular: Normal rate, regular rhythm and normal heart sounds.  Exam reveals no gallop.   No murmur heard. Pulmonary/Chest: Effort normal and breath sounds normal. No stridor. No respiratory distress. She has no decreased breath sounds. She has no wheezes. She has no rhonchi. She has no rales.  Abdominal: Soft. Normal appearance and bowel sounds are normal. She exhibits no distension. There is no tenderness. There is no rebound and no CVA tenderness.  Musculoskeletal: Normal range of motion. She exhibits no edema or tenderness.  Neurological: She is alert and oriented to person, place, and time. She has normal strength. No cranial nerve deficit or sensory deficit. GCS eye subscore is 4. GCS verbal subscore is 5. GCS motor subscore is 6.  Skin: Skin is warm and dry. No abrasion and no rash noted.  Psychiatric: She has a normal mood and affect. Her speech is normal and behavior is normal.  Nursing note and vitals reviewed.    ED Treatments / Results  Labs (all labs ordered are listed, but only abnormal results are displayed) Labs Reviewed  CBC - Abnormal; Notable for the following:       Result Value   WBC 10.8 (*)    All other components within normal limits  DIFFERENTIAL - Abnormal; Notable for the following:    Monocytes Absolute 1.1 (*)    All other components within normal limits  I-STAT CHEM 8, ED - Abnormal; Notable for  the following:    Chloride 99 (*)    Glucose, Bld 140 (*)    All other components within normal limits  PROTIME-INR  APTT  COMPREHENSIVE METABOLIC PANEL  I-STAT TROPOININ, ED  CBG MONITORING, ED  I-STAT BETA HCG BLOOD, ED ([facility], [facility], [facility] ONLY)    EKG  EKG Interpretation  Date/Time:  Monday September 27 2016 16:06:06 EDT Ventricular Rate:  96 PR Interval:    QRS Duration: 93 QT Interval:  361 QTC Calculation: 457 R Axis:   63 Text Interpretation:  Sinus rhythm Abnormal R-wave progression, early transition No significant change since last tracing Confirmed by Bence Trapp  MD, Olanrewaju Osborn ([id number]) on 09/27/2016 4:30:59 PM       Radiology No results found.  Procedures Procedures (including  critical care time)  Medications Ordered in ED Medications  0.9 %  sodium chloride infusion (not administered)     Initial Impression / Assessment and Plan / ED Course  I have reviewed the triage vital signs and the nursing notes.  Pertinent labs & imaging results that were available during my care of the patient were reviewed by me and considered in my medical decision making (see chart for details).     Patient given Decadron as well as IV Keppra. Discussed with the oncologist on call who recommends patient be admitted for overnight observation  Final Clinical Impressions(s) / ED Diagnoses   Final diagnoses:  None    New Prescriptions New Prescriptions   No medications on file     Lacretia Leigh, MD 09/27/16 1925

## 2016-09-27 NOTE — ED Notes (Signed)
Pt kept NPO tech informed and pt informed

## 2016-09-27 NOTE — ED Notes (Signed)
Pharm notified for keppra

## 2016-09-27 NOTE — ED Notes (Signed)
Pt in MRI.

## 2016-09-28 ENCOUNTER — Ambulatory Visit
Admission: RE | Admit: 2016-09-28 | Discharge: 2016-09-28 | Disposition: A | Discharge status: Home / Self Care | Payer: 59 | Source: Ambulatory Visit | Attending: Radiation Oncology | Admitting: Radiation Oncology

## 2016-09-28 ENCOUNTER — Ambulatory Visit (HOSPITAL_COMMUNITY): Admit: 2016-09-28 | Payer: 59

## 2016-09-28 ENCOUNTER — Observation Stay (HOSPITAL_BASED_OUTPATIENT_CLINIC_OR_DEPARTMENT_OTHER)
Admit: 2016-09-28 | Discharge: 2016-09-28 | Disposition: A | Discharge status: Home / Self Care | Payer: 59 | Attending: Internal Medicine | Admitting: Internal Medicine

## 2016-09-28 ENCOUNTER — Encounter (HOSPITAL_COMMUNITY): Payer: Self-pay

## 2016-09-28 ENCOUNTER — Other Ambulatory Visit: Payer: Self-pay | Admitting: Radiation Oncology

## 2016-09-28 DIAGNOSIS — Z853 Personal history of malignant neoplasm of breast: Secondary | ICD-10-CM | POA: Diagnosis not present

## 2016-09-28 DIAGNOSIS — Z87891 Personal history of nicotine dependence: Secondary | ICD-10-CM | POA: Diagnosis not present

## 2016-09-28 DIAGNOSIS — Z8701 Personal history of pneumonia (recurrent): Secondary | ICD-10-CM | POA: Diagnosis not present

## 2016-09-28 DIAGNOSIS — Z515 Encounter for palliative care: Secondary | ICD-10-CM | POA: Diagnosis not present

## 2016-09-28 DIAGNOSIS — R9401 Abnormal electroencephalogram [EEG]: Secondary | ICD-10-CM | POA: Diagnosis present

## 2016-09-28 DIAGNOSIS — R569 Unspecified convulsions: Secondary | ICD-10-CM

## 2016-09-28 DIAGNOSIS — D496 Neoplasm of unspecified behavior of brain: Secondary | ICD-10-CM | POA: Diagnosis not present

## 2016-09-28 DIAGNOSIS — C7931 Secondary malignant neoplasm of brain: Secondary | ICD-10-CM | POA: Diagnosis present

## 2016-09-28 DIAGNOSIS — D72829 Elevated white blood cell count, unspecified: Secondary | ICD-10-CM | POA: Diagnosis present

## 2016-09-28 DIAGNOSIS — G40109 Localization-related (focal) (partial) symptomatic epilepsy and epileptic syndromes with simple partial seizures, not intractable, without status epilepticus: Secondary | ICD-10-CM | POA: Diagnosis not present

## 2016-09-28 DIAGNOSIS — C3412 Malignant neoplasm of upper lobe, left bronchus or lung: Secondary | ICD-10-CM | POA: Diagnosis present

## 2016-09-28 DIAGNOSIS — Z79899 Other long term (current) drug therapy: Secondary | ICD-10-CM | POA: Diagnosis not present

## 2016-09-28 DIAGNOSIS — C3492 Malignant neoplasm of unspecified part of left bronchus or lung: Secondary | ICD-10-CM

## 2016-09-28 DIAGNOSIS — Z7951 Long term (current) use of inhaled steroids: Secondary | ICD-10-CM | POA: Diagnosis not present

## 2016-09-28 DIAGNOSIS — Z7189 Other specified counseling: Secondary | ICD-10-CM | POA: Diagnosis not present

## 2016-09-28 DIAGNOSIS — R410 Disorientation, unspecified: Secondary | ICD-10-CM | POA: Diagnosis present

## 2016-09-28 DIAGNOSIS — Z9181 History of falling: Secondary | ICD-10-CM | POA: Diagnosis not present

## 2016-09-28 DIAGNOSIS — Z9221 Personal history of antineoplastic chemotherapy: Secondary | ICD-10-CM | POA: Diagnosis not present

## 2016-09-28 DIAGNOSIS — Z923 Personal history of irradiation: Secondary | ICD-10-CM | POA: Diagnosis not present

## 2016-09-28 DIAGNOSIS — T380X5A Adverse effect of glucocorticoids and synthetic analogues, initial encounter: Secondary | ICD-10-CM | POA: Diagnosis present

## 2016-09-28 DIAGNOSIS — I10 Essential (primary) hypertension: Secondary | ICD-10-CM | POA: Diagnosis present

## 2016-09-28 DIAGNOSIS — E559 Vitamin D deficiency, unspecified: Secondary | ICD-10-CM | POA: Diagnosis present

## 2016-09-28 DIAGNOSIS — Z90710 Acquired absence of both cervix and uterus: Secondary | ICD-10-CM | POA: Diagnosis not present

## 2016-09-28 DIAGNOSIS — F40240 Claustrophobia: Secondary | ICD-10-CM | POA: Diagnosis present

## 2016-09-28 DIAGNOSIS — G4089 Other seizures: Secondary | ICD-10-CM | POA: Diagnosis present

## 2016-09-28 DIAGNOSIS — J449 Chronic obstructive pulmonary disease, unspecified: Secondary | ICD-10-CM | POA: Diagnosis present

## 2016-09-28 DIAGNOSIS — G936 Cerebral edema: Secondary | ICD-10-CM | POA: Diagnosis present

## 2016-09-28 LAB — BASIC METABOLIC PANEL
Anion gap: 9 (ref 5–15)
BUN: 12 mg/dL (ref 6–20)
CALCIUM: 9.6 mg/dL (ref 8.9–10.3)
CO2: 24 mmol/L (ref 22–32)
CREATININE: 0.7 mg/dL (ref 0.44–1.00)
Chloride: 104 mmol/L (ref 101–111)
GFR calc non Af Amer: >60 mL/min (ref >60)
Glucose, Bld: 180 mg/dL — ABNORMAL HIGH (ref 65–99)
Potassium: 4.3 mmol/L (ref 3.5–5.1)
SODIUM: 137 mmol/L (ref 135–145)

## 2016-09-28 LAB — CBC
HCT: 37.1 % (ref 36.0–46.0)
Hemoglobin: 12.8 g/dL (ref 12.0–15.0)
MCH: 33.2 pg (ref 26.0–34.0)
MCHC: 34.5 g/dL (ref 30.0–36.0)
MCV: 96.4 fL (ref 78.0–100.0)
PLATELETS: 304 10*3/uL (ref 150–400)
RBC: 3.85 MIL/uL — AB (ref 3.87–5.11)
RDW: 13.7 % (ref 11.5–15.5)
WBC: 8.5 10*3/uL (ref 4.0–10.5)

## 2016-09-28 LAB — CREATININE, SERUM
CREATININE: 0.68 mg/dL (ref 0.44–1.00)
GFR calc non Af Amer: >60 mL/min (ref >60)

## 2016-09-28 MED ORDER — MECLIZINE HCL 25 MG PO TABS: 12.5000 mg | ORAL_TABLET | Freq: Three times a day (TID) | ORAL | Status: DC | PRN

## 2016-09-28 MED ORDER — ALBUTEROL SULFATE HFA 108 (90 BASE) MCG/ACT IN AERS: 1.0000 | INHALATION_SPRAY | Freq: Four times a day (QID) | RESPIRATORY_TRACT | Status: DC | PRN

## 2016-09-28 MED ORDER — ALBUTEROL SULFATE (2.5 MG/3ML) 0.083% IN NEBU: 2.5000 mg | INHALATION_SOLUTION | RESPIRATORY_TRACT | Status: DC | PRN

## 2016-09-28 MED ORDER — ENOXAPARIN SODIUM 40 MG/0.4ML ~~LOC~~ SOLN
40.0000 mg | SUBCUTANEOUS | Status: DC
  Administered 2016-09-28 – 2016-10-01 (×4): 40 mg via SUBCUTANEOUS
  Filled 2016-09-28 (×4): qty 0.4

## 2016-09-28 MED ORDER — ONDANSETRON HCL 4 MG PO TABS: 4.0000 mg | ORAL_TABLET | Freq: Four times a day (QID) | ORAL | Status: DC | PRN

## 2016-09-28 MED ORDER — MOMETASONE FURO-FORMOTEROL FUM 200-5 MCG/ACT IN AERO
2.0000 | INHALATION_SPRAY | Freq: Two times a day (BID) | RESPIRATORY_TRACT | Status: DC
  Administered 2016-09-28 – 2016-10-01 (×6): 2 via RESPIRATORY_TRACT
  Filled 2016-09-28: qty 8.8

## 2016-09-28 MED ORDER — LORAZEPAM 0.5 MG PO TABS: ORAL_TABLET | ORAL | 0 refills | Status: DC

## 2016-09-28 MED ORDER — DEXAMETHASONE SODIUM PHOSPHATE 4 MG/ML IJ SOLN
4.0000 mg | Freq: Four times a day (QID) | INTRAMUSCULAR | Status: DC
  Administered 2016-09-28 – 2016-09-30 (×8): 4 mg via INTRAVENOUS
  Filled 2016-09-28 (×10): qty 1

## 2016-09-28 MED ORDER — VITAMIN D (ERGOCALCIFEROL) 1.25 MG (50000 UNIT) PO CAPS
50000.0000 [IU] | ORAL_CAPSULE | Freq: Every day | ORAL | Status: DC
  Administered 2016-09-28: 50000 [IU] via ORAL
  Filled 2016-09-28 (×2): qty 1

## 2016-09-28 MED ORDER — PROCHLORPERAZINE MALEATE 10 MG PO TABS: 5.0000 mg | ORAL_TABLET | Freq: Three times a day (TID) | ORAL | Status: DC | PRN

## 2016-09-28 MED ORDER — SODIUM CHLORIDE 0.9 % IV SOLN
500.0000 mg | Freq: Two times a day (BID) | INTRAVENOUS | Status: DC
  Administered 2016-09-28 – 2016-09-29 (×4): 500 mg via INTRAVENOUS
  Filled 2016-09-28 (×6): qty 5

## 2016-09-28 MED ORDER — LORAZEPAM 2 MG/ML IJ SOLN
1.0000 mg | Freq: Once | INTRAMUSCULAR | Status: AC | PRN
  Administered 2016-09-30: 1 mg via INTRAVENOUS
  Filled 2016-09-28: qty 1

## 2016-09-28 MED ORDER — ENSURE ENLIVE PO LIQD
237.0000 mL | Freq: Two times a day (BID) | ORAL | Status: DC
  Administered 2016-09-28: 237 mL via ORAL

## 2016-09-28 MED ORDER — ONDANSETRON HCL 4 MG/2ML IJ SOLN: 4.0000 mg | Freq: Four times a day (QID) | INTRAMUSCULAR | Status: DC | PRN

## 2016-09-28 MED ORDER — PREMIER PROTEIN SHAKE
11.0000 [oz_av] | Freq: Two times a day (BID) | ORAL | Status: DC
  Administered 2016-09-29 – 2016-10-01 (×5): 11 [oz_av] via ORAL
  Filled 2016-09-28 (×7): qty 325.31

## 2016-09-28 MED ORDER — ACETAMINOPHEN 325 MG PO TABS: 650.0000 mg | ORAL_TABLET | Freq: Four times a day (QID) | ORAL | Status: DC | PRN

## 2016-09-28 MED ORDER — ACETAMINOPHEN 650 MG RE SUPP: 650.0000 mg | Freq: Four times a day (QID) | RECTAL | Status: DC | PRN

## 2016-09-28 NOTE — Progress Notes (Signed)
Initial Nutrition Assessment  DOCUMENTATION CODES:   Obesity unspecified  INTERVENTION:   Premier Protein BID, each supplement provides 160kcal and 30g protein.   NUTRITION DIAGNOSIS:   Increased nutrient needs related to cancer and cancer related treatments (COPD) as evidenced by increased estimated needs from protein.  GOAL:   Patient will meet greater than or equal to 90% of their needs  MONITOR:   PO intake, Supplement acceptance, Labs, Weight trends  REASON FOR ASSESSMENT:   Malnutrition Screening Tool    ASSESSMENT:    61 y.o. female with history of COPD and extensive stage small cell lung cancer was referred to the ER after patient was found to have increasing right upper extremity jerking movements and also difficulty ambulating. Patient states his symptoms gradually worsened over the last few months. Last few days it has worsened. 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 Pt reports good appetite today and pta. Pt reports that she is eating 100% of meals. Pt is asking for Ensure at time of RD visit. RD will switch pt to premier protein r/t morbid obesity. Per chart, pt has lost 24lbs(9%) in 5 months; this is significant given time frame and history. Continue to encourage intake of meals and supplements.    Medications reviewed and include: dexamethasone, lovenox, Vit D  Labs reviewed: cbgs- 177, 141, 140, 180 x 24 hrs  Nutrition-Focused physical exam completed. Findings are no fat depletion, no muscle depletion, and no edema.   Diet Order:  Diet regular Room service appropriate? Yes; Fluid consistency: Thin  Skin:  Reviewed, no issues  Last BM:  6/5  Height:   Ht Readings from Last 1 Encounters:  09/28/16 '5\' 6"'  (1.676 m)    Weight:   Wt Readings from Last 1 Encounters:  09/28/16 242 lb (109.8 kg)    Ideal Body Weight:  59 kg  BMI:  Body mass index is 39.06 kg/m.  Estimated Nutritional Needs:   Kcal:  1800-2000kcal/day    Protein:  110-121g/day   Fluid:  >1.8L/day   EDUCATION NEEDS:   Education needs addressed  Koleen Distance MS, RD, LDN Pager #[phone]

## 2016-09-28 NOTE — Procedures (Signed)
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  Medications: acetaminophen (TYLENOL)   albuterol (PROVENTIL) (2.5 MG/3ML) 0.083% nebulizer dexamethasone (DECADRON) injection 4 mg  enoxaparin (LOVENOX) injection 40 mg  levETIRAcetam (KEPPRA) 500 mg meclizine (ANTIVERT) tablet 12.5-25 mg  mometasone-formoterol (DULERA) ondansetron (ZOFRAN)  Lprochlorperazine (COMPAZINE) tablet 5 mg  protein supplement (PREMIER PROTEIN) liquid  Vitamin D (Ergocalciferol) (DRISDOL)   Technical Summary: A multichannel digital EEG recording measured by the international 10-20 system with electrodes applied with paste and impedances below 5000 ohms performed in our laboratory with EKG monitoring in an awake and drowsy patient.  Hyperventilation and photic stimulation were not performed.  The digital EEG was referentially recorded, reformatted, and digitally filtered in a variety of bipolar and referential montages for optimal display.    Description: The patient is awake and drowsy during the recording.  During maximal wakefulness, there is a symmetric, medium voltage 9 Hz posterior dominant rhythm that attenuates with eye opening.  There is mild theta slowing in the left hemisphere.  Stage 2 sleep is not seen.  There were no epileptiform discharges or electrographic seizures seen.    EKG lead was unremarkable.  Impression: This awake and drowsy EEG is abnormal due to mild left hemispheric slowing  Clinical Correlation: The above finding is indicative of a structural or other physiologic focal abnormality in that region, such as a metastatic lesion.  Metta Clines, DO

## 2016-09-28 NOTE — Progress Notes (Signed)
Offsite EEG completed at [facility]. Results pending. 

## 2016-09-28 NOTE — Progress Notes (Signed)
Subjective: Sharon [name] is seen and examined today. The patient was admitted last night with seizure-like activity and confusion. MRI of the brain was performed and showed 2.3 x 2.2 x 2.2 cm left frontoparietal mass suspicious for a solitary intracranial metastasis with associated vasogenic edema and trace 2 mm left-to-right shift. The patient was started on Decadron initially 10 mg IV 1 and then 4 mg every 6 hours. She is feeling much better today. She still has some moments of confusion. She denied having any other complaints. She has no fever or chills. She has no nausea or vomiting.  Objective: Vital signs in last 24 hours: Temp:  [97.3 F (36.3 C)-98.5 F (36.9 C)] 98.4 F (36.9 C) (06/05 1246) Pulse Rate:  [72-87] 76 (06/05 1246) Resp:  [16-23] 16 (06/05 1246) BP: (101-150)/(40-111) 118/64 (06/05 1246) SpO2:  [94 %-98 %] 96 % (06/05 1246) Weight:  [242 lb (109.8 kg)] 242 lb (109.8 kg) (06/05 0146)  Intake/Output from previous day: 06/04 0701 - 06/05 0700 In: 140 [I.V.:40; IV Piggyback:100] Out: 800 [Urine:800] Intake/Output this shift: Total I/O In: 10 [I.V.:10] Out: 550 [Urine:550]  General appearance: alert, cooperative, fatigued and no distress Resp: clear to auscultation bilaterally Cardio: regular rate and rhythm, S1, S2 normal, no murmur, click, rub or gallop GI: soft, non-tender; bowel sounds normal; no masses,  no organomegaly Extremities: extremities normal, atraumatic, no cyanosis or edema  Lab Results:   Recent Labs  09/27/16 1611 09/27/16 1622 09/28/16 0656  WBC 10.8*  --  8.5  HGB 13.6 13.9 12.8  HCT 39.4 41.0 37.1  PLT 301  --  304   BMET  Recent Labs  09/27/16 1611 09/27/16 1622 09/28/16 0656  NA 137 138 137  K 3.8 3.8 4.3  CL 103 99* 104  CO2 26  --  24  GLUCOSE 141* 140* 180*  BUN 12 11 12   CREATININE 0.75 0.70 0.70  0.68  CALCIUM 9.6  --  9.6    Studies/Results: Ct Head Wo Contrast  Result Date: 09/27/2016 CLINICAL DATA:  Per patient,  states seizure activity after radiation-states last week she was found by son down-states she had bit tongue and went incontinent of urine. History of breast cancer. EXAM: CT HEAD WITHOUT CONTRAST TECHNIQUE: Contiguous axial images were obtained from the base of the skull through the vertex without intravenous contrast. COMPARISON:  MR brain 04/11/2016 FINDINGS: Brain: No evidence of acute infarction, hemorrhage, hydrocephalus or extra-axial collection. 15 mm lesion in the high a left frontal lobe with severe surrounding vasogenic edema. Mild mass effect on the left frontal horn of the lateral ventricle. No other lesions are identified on this noncontrast examination. Vascular: No hyperdense vessel or unexpected calcification. Skull: No osseous abnormality. Sinuses/Orbits: Visualized paranasal sinuses are clear. Visualized mastoid sinuses are clear. Visualized orbits demonstrate no focal abnormality. Other: None IMPRESSION: 1. 15 mm ill-defined brain lesion in the high left frontal lobe with severe surrounding vasogenic edema most concerning for metastatic disease. Recommend further evaluation with an MRI of the brain without and with intravenous contrast. These results were called by telephone at the time of interpretation on 09/27/2016 at 6:52 pm to Dr. Zenia Resides , who verbally acknowledged these results. Electronically Signed   By: Kathreen Devoid   On: 09/27/2016 18:54   Mr Brain W And Wo Contrast  Result Date: 09/27/2016 CLINICAL DATA:  Initial evaluation for intracranial metastatic disease. EXAM: MRI HEAD WITHOUT AND WITH CONTRAST TECHNIQUE: Multiplanar, multiecho pulse sequences of the brain and surrounding structures were  obtained without and with intravenous contrast. CONTRAST:  64mL MULTIHANCE GADOBENATE DIMEGLUMINE 529 MG/ML IV SOLN COMPARISON:  Prior CT from earlier same day. FINDINGS: Brain: Study mildly degraded by motion artifact. Cerebral volume within normal limits for age. Few scattered subcentimeter  T2/FLAIR hyperintense foci noted within the periventricular and deep white matter both cerebral hemispheres, nonspecific, but felt to be within normal limits for age. No abnormal foci of restricted diffusion to suggest acute or subacute ischemia. No areas of chronic infarction identified. 2.3 x 2.2 x 2.2 cm heterogeneously enhancing mass centered along the gray-white matter differentiation at the left frontal parietal region (series 12, image 41), most consistent with a probable intracranial metastasis. Extensive vasogenic edema throughout the adjacent left frontal parietal region. Trace 2 mm left-to-right shift. No hydrocephalus or ventricular trapping. No other definite lesions identified at this time. A punctate focus of apparent enhancement at the posterior left parietal lobe on axial post-contrast sequence noted (series 12, image 32), not seen on corresponding coronal or sagittal sequences. Artifact is favored. No other abnormal enhancement. No other mass lesion. Few small foci of susceptibility artifact noted within the right parietal lobe and left frontotemporal region (series 9, image 52). No associated enhancement. No other evidence for acute or chronic intracranial hemorrhage. Major dural sinuses are grossly patent. Pituitary gland within normal limits. Vascular: Major intracranial vascular flow voids are maintained. Skull and upper cervical spine: Craniocervical junction within normal limits. Visualized upper cervical spine unremarkable. Bone marrow signal intensity within normal limits. No scalp soft tissue abnormality. Sinuses/Orbits: Globes and orbital soft tissues within normal limits. Mild right sphenoid sinus disease noted. Paranasal sinuses are otherwise clear. Small right mastoid effusion noted. Inner ear structures normal. Other: None. IMPRESSION: 1. 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 Associated vasogenic edema with  trace 2 mm left-to-right shift. 2. Additional punctate focus of nodular enhancement within the left parietal lobe as above. Artifact is favored, although attention at follow-up is recommended. Electronically Signed   By: Jeannine Boga M.D.   On: 09/27/2016 22:49    Medications: I have reviewed the patient's current medications.  Assessment/Plan: This is a very pleasant 61 years old white female with extensive stage small cell lung cancer status post systemic chemotherapy with cisplatin and etoposide for 6 cycles followed by prophylactic cranial irradiation followed by radiotherapy to a eating left upper lobe lung nodule. 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 She would have repeat MRI of the brain with SRS protocol followed by stereotactic radiotherapy to the solitary brain metastasis. I would recommend continuing treatment with Decadron for now. I will consider the patient for repeat CT scan of the chest, abdomen and pelvis for restaging of her disease and to rule out any other systemic metastatic disease. For the seizure activity, she will continue her current treatment with Keppra. Thank you for taking good care of Mrs.Specht, I will continue to follow up the patient with you and assist in her management on as-needed basis.    LOS: 0 days    Karenna Romanoff K. 09/28/2016

## 2016-09-28 NOTE — Care Management Note (Signed)
Case Management Note  Patient Details  Name: HENRY DEMERITT MRN: [id number] Date of Birth: [birth year]  Subjective/Objective:                  61 y.o. female with history of extensive stage small cell lung cancer was referred to the ER after patient was found to have increasing right upper extremity jerking movements and also difficulty ambulating. Patient states his symptoms gradually worsened over the last few months. Last few days it has worsened. Denies losing consciousness but has had a fall. Patient states she had followed up with her radiation oncologist and was referred to the ER.  ED Course: In the ER patient MRI brain shows left frontoparietal mass with vasogenic edema. ER physician had discussed with on-call oncologist Dr. Irene Limbo who advised patient to be placed on Decadron IV and start Keppra. Patient has been admitted for further observation. On my exam patient is able to move all extremities without any difficulty.  Action/Plan: Date:  September 28, 2016  Chart reviewed for concurrent status and case management needs.  Will continue to follow patient progress.  Discharge Planning: following for needs  Expected discharge date: [id number]  Velva Harman, BSN, [place], [place]   Expected Discharge Date:                  Expected Discharge Plan:  Home/Self Care  In-House Referral:     Discharge planning Services  CM Consult  Post Acute Care Choice:    Choice offered to:     DME Arranged:    DME Agency:     HH Arranged:    HH Agency:     Status of Service:  In process, will continue to follow  If discussed at Long Length of Stay Meetings, dates discussed:    Additional Comments:  Leeroy Cha, RN 09/28/2016, 9:38 AM

## 2016-09-28 NOTE — H&P (Signed)
History and Physical    Sharon [name] BPZ:[id number] DOB: 1955-05-23 DOA: 09/27/2016  PCP: [name], PA  Patient coming from: Home.  Chief Complaint: Abnormal jerking movements.  HPI: Sharon [name] is a 61 y.o. female with history of extensive stage small cell lung cancer was referred to the ER after patient was found to have increasing right upper extremity jerking movements and also difficulty ambulating. Patient states his symptoms gradually worsened over the last few months. Last few days it has worsened. Denies losing consciousness but has had a fall. Patient states she had followed up with her radiation oncologist and was referred to the ER.  ED Course: In the ER patient MRI brain shows left frontoparietal mass with vasogenic edema. ER physician had discussed with on-call oncologist Dr. Irene Limbo who advised patient to be placed on Decadron IV and start Keppra. Patient has been admitted for further observation. On my exam patient is able to move all extremities without any difficulty.  Review of Systems: As per HPI, rest all negative.   Past Medical History:  Diagnosis Date  . Cancer of right breast ([place]) 2009  . CAP (community acquired pneumonia) 11/06/2015  . COPD (chronic obstructive pulmonary disease) ([place])   . Elevated blood pressure   . Encounter for antineoplastic chemotherapy 01/05/2016  . History of radiation therapy 05/04/16-05/18/16   whole brain 25 Gy in 10 fractions  . Hoarseness of voice    "for the last 2 months" (11/06/2015)  . Hypertension   . Lung mass dx'd 10/2015  . Menopause   . Personal history of breast cancer 03/29/2016  . Pre-diabetes   . Small cell lung cancer ([place])   . Vitamin D deficiency     Past Surgical History:  Procedure Laterality Date  . BREAST BIOPSY Right 2009  . BREAST LUMPECTOMY Right 2009  . UTERINE FIBROID SURGERY  2000  . VAGINAL HYSTERECTOMY  2000  . VESICOVAGINAL FISTULA CLOSURE W/ TAH  2000  . VIDEO BRONCHOSCOPY WITH  ENDOBRONCHIAL ULTRASOUND N/A 11/10/2015   Procedure: VIDEO BRONCHOSCOPY WITH ENDOBRONCHIAL ULTRASOUND;  Surgeon: Javier Glazier, MD;  Location: [place];  Service: Thoracic;  Laterality: N/A;     reports that she quit smoking about 10 months ago. Her smoking use included Cigarettes. She has a 4.60 pack-year smoking history. She has never used smokeless tobacco. She reports that she uses drugs, including Marijuana. She reports that she does not drink alcohol.  No Known Allergies  Family History  Problem Relation Age of Onset  . Adopted: Yes  . Cancer Neg Hx     Prior to Admission medications   Medication Sig Start Date End Date Taking? Authorizing Provider  albuterol (PROVENTIL HFA;VENTOLIN HFA) 108 (90 Base) MCG/ACT inhaler Inhale 1 puff into the lungs every 6 (six) hours as needed for wheezing or shortness of breath.   Yes [provider]  budesonide-formoterol (SYMBICORT) 160-4.5 MCG/ACT inhaler Inhale 2 puffs into the lungs 2 (two) times daily. 08/17/16  Yes Javier Glazier, MD  Cholecalciferol (VITAMIN D3) 50000 units CAPS Take 1 tablet by mouth daily. 07/21/16 11/18/16 Yes [provider]  emollient (BIAFINE) cream Apply topically 2 (two) times daily.   Yes [provider]  meclizine (ANTIVERT) 12.5 MG tablet Take 1-2 tablets (12.5-25 mg total) by mouth 3 (three) times daily as needed for dizziness. 06/05/16  Yes Virgel Manifold, MD  prochlorperazine (COMPAZINE) 10 MG tablet TAKE 1 TABLET BY MOUTH EVERY 6 HOURS AS NEEDED FOR NAUSEA AND  VOMITING 03/12/16  Yes Curt Bears, MD  UNABLE TO FIND Take 10 mLs by mouth daily as needed. Med Name: CBD Oil   Yes [provider]    Physical Exam: Vitals:   09/27/16 2329 09/28/16 0100 09/28/16 0135 09/28/16 0146  BP: (!) 143/88 (!) 149/98 120/64   Pulse: 85 86 73   Resp: 20 20 18    Temp: 97.3 F (36.3 C) 97.5 F (36.4 C) 98.4 F (36.9 C)   TempSrc: Oral Oral Oral   SpO2: 96% 98% 94%   Weight:    109.8  kg (242 lb)  Height:    5\' 6"  (1.676 m)      Constitutional: Moderately built and nourished. Vitals:   09/27/16 2329 09/28/16 0100 09/28/16 0135 09/28/16 0146  BP: (!) 143/88 (!) 149/98 120/64   Pulse: 85 86 73   Resp: 20 20 18    Temp: 97.3 F (36.3 C) 97.5 F (36.4 C) 98.4 F (36.9 C)   TempSrc: Oral Oral Oral   SpO2: 96% 98% 94%   Weight:    109.8 kg (242 lb)  Height:    5\' 6"  (1.676 m)   Eyes: Anicteric no pallor. ENMT: No discharge from the ears eyes nose and mouth. Neck: No mass felt. No neck rigidity. Respiratory: No rhonchi or crepitations. Cardiovascular: S1-S2 heard no murmurs appreciated. Abdomen: Soft nontender bowel sounds present. Musculoskeletal: No edema. No joint effusion. Skin: No rash. Skin appears warm. Neurologic: Alert awake oriented to time place and person. Moves all extremities. Psychiatric: Appears normal. Normal affect.   Labs on Admission: I have personally reviewed following labs and imaging studies  CBC:  Recent Labs Lab 09/27/16 1319 09/27/16 1611 09/27/16 1622  WBC 10.1 10.8*  --   NEUTROABS 7.9* 7.7  --   HGB 13.4 13.6 13.9  HCT 40.2 39.4 41.0  MCV 98.3 96.3  --   PLT 279 301  --    Basic Metabolic Panel:  Recent Labs Lab 09/27/16 1319 09/27/16 1611 09/27/16 1622  NA 137 137 138  K 3.9 3.8 3.8  CL  --  103 99*  CO2 24 26  --   GLUCOSE 177* 141* 140*  BUN 11.7 12 11   CREATININE 0.8 0.75 0.70  CALCIUM 10.0 9.6  --    GFR: Estimated Creatinine Clearance: 95 mL/min (by C-G formula based on SCr of 0.7 mg/dL). Liver Function Tests:  Recent Labs Lab 09/27/16 1319 09/27/16 1611  AST 27 34  ALT 34 36  ALKPHOS 80 79  BILITOT 0.38 0.5  PROT 7.1 7.5  ALBUMIN 3.6 4.1   No results for input(s): LIPASE, AMYLASE in the last 168 hours. No results for input(s): AMMONIA in the last 168 hours. Coagulation Profile:  Recent Labs Lab 09/27/16 1611  INR 0.99   Cardiac Enzymes: No results for input(s): CKTOTAL, CKMB,  CKMBINDEX, TROPONINI in the last 168 hours. BNP (last 3 results) No results for input(s): PROBNP in the last 8760 hours. HbA1C: No results for input(s): HGBA1C in the last 72 hours. CBG:  Recent Labs Lab 09/27/16 1657  GLUCAP 96   Lipid Profile: No results for input(s): CHOL, HDL, LDLCALC, TRIG, CHOLHDL, LDLDIRECT in the last 72 hours. Thyroid Function Tests: No results for input(s): TSH, T4TOTAL, FREET4, T3FREE, THYROIDAB in the last 72 hours. Anemia Panel: No results for input(s): VITAMINB12, FOLATE, FERRITIN, TIBC, IRON, RETICCTPCT in the last 72 hours. Urine analysis: No results found for: COLORURINE, APPEARANCEUR, LABSPEC, Carbon, GLUCOSEU, HGBUR, BILIRUBINUR, KETONESUR, PROTEINUR, UROBILINOGEN, NITRITE,  LEUKOCYTESUR Sepsis Labs: @LABRCNTIP (procalcitonin:4,lacticidven:4) )No results found for this or any previous visit (from the past 240 hour(s)).   Radiological Exams on Admission: Ct Head Wo Contrast  Result Date: 09/27/2016 CLINICAL DATA:  Per patient, states seizure activity after radiation-states last week she was found by son down-states she had bit tongue and went incontinent of urine. History of breast cancer. EXAM: CT HEAD WITHOUT CONTRAST TECHNIQUE: Contiguous axial images were obtained from the base of the skull through the vertex without intravenous contrast. COMPARISON:  MR brain 04/11/2016 FINDINGS: Brain: No evidence of acute infarction, hemorrhage, hydrocephalus or extra-axial collection. 15 mm lesion in the high a left frontal lobe with severe surrounding vasogenic edema. Mild mass effect on the left frontal horn of the lateral ventricle. No other lesions are identified on this noncontrast examination. Vascular: No hyperdense vessel or unexpected calcification. Skull: No osseous abnormality. Sinuses/Orbits: Visualized paranasal sinuses are clear. Visualized mastoid sinuses are clear. Visualized orbits demonstrate no focal abnormality. Other: None IMPRESSION: 1. 15 mm  ill-defined brain lesion in the high left frontal lobe with severe surrounding vasogenic edema most concerning for metastatic disease. Recommend further evaluation with an MRI of the brain without and with intravenous contrast. These results were called by telephone at the time of interpretation on 09/27/2016 at 6:52 pm to Dr. Zenia Resides , who verbally acknowledged these results. Electronically Signed   By: Kathreen Devoid   On: 09/27/2016 18:54   Mr Brain W And Wo Contrast  Result Date: 09/27/2016 CLINICAL DATA:  Initial evaluation for intracranial metastatic disease. EXAM: MRI HEAD WITHOUT AND WITH CONTRAST TECHNIQUE: Multiplanar, multiecho pulse sequences of the brain and surrounding structures were obtained without and with intravenous contrast. CONTRAST:  19mL MULTIHANCE GADOBENATE DIMEGLUMINE 529 MG/ML IV SOLN COMPARISON:  Prior CT from earlier same day. FINDINGS: Brain: Study mildly degraded by motion artifact. Cerebral volume within normal limits for age. Few scattered subcentimeter T2/FLAIR hyperintense foci noted within the periventricular and deep white matter both cerebral hemispheres, nonspecific, but felt to be within normal limits for age. No abnormal foci of restricted diffusion to suggest acute or subacute ischemia. No areas of chronic infarction identified. 2.3 x 2.2 x 2.2 cm heterogeneously enhancing mass centered along the gray-white matter differentiation at the left frontal parietal region (series 12, image 41), most consistent with a probable intracranial metastasis. Extensive vasogenic edema throughout the adjacent left frontal parietal region. Trace 2 mm left-to-right shift. No hydrocephalus or ventricular trapping. No other definite lesions identified at this time. A punctate focus of apparent enhancement at the posterior left parietal lobe on axial post-contrast sequence noted (series 12, image 32), not seen on corresponding coronal or sagittal sequences. Artifact is favored. No other abnormal  enhancement. No other mass lesion. Few small foci of susceptibility artifact noted within the right parietal lobe and left frontotemporal region (series 9, image 52). No associated enhancement. No other evidence for acute or chronic intracranial hemorrhage. Major dural sinuses are grossly patent. Pituitary gland within normal limits. Vascular: Major intracranial vascular flow voids are maintained. Skull and upper cervical spine: Craniocervical junction within normal limits. Visualized upper cervical spine unremarkable. Bone marrow signal intensity within normal limits. No scalp soft tissue abnormality. Sinuses/Orbits: Globes and orbital soft tissues within normal limits. Mild right sphenoid sinus disease noted. Paranasal sinuses are otherwise clear. Small right mastoid effusion noted. Inner ear structures normal. Other: None. IMPRESSION: 1. 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 Associated  vasogenic edema with trace 2 mm left-to-right shift. 2. Additional punctate focus of nodular enhancement within the left parietal lobe as above. Artifact is favored, although attention at follow-up is recommended. Electronically Signed   By: Jeannine Boga M.D.   On: 09/27/2016 22:49     Assessment/Plan Principal Problem:   Brain metastasis ([name]) Active Problems:   COPD, severe ([place])   Primary small cell carcinoma of left lung ([name])   Personal history of breast cancer   Seizure ([place])    1. 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 At this time I have placed patient on Keppra 500 mg IV every 12 with Decadron 4 mg IV every 6. Check EEG. Please discuss with Dr. Julien Nordmann patient's oncologist in a.m. Discussed with on-call neurologist Dr. Shon Hale who advised to continue with Keppra. 2. COPD - not actively wheezing at this time. 3. History of breast cancer in remission.   DVT prophylaxis:  Lovenox. Code Status: Full code.  Family Communication: Discussed with patient.  Disposition Plan: Home.  Consults called: Neurology.  Admission status: Observation.    Rise Patience MD [facility] Pager [phone].  If 7PM-7AM, please contact night-coverage [email or web address] Password [email or web address]  09/28/2016, 3:01 AM

## 2016-09-28 NOTE — Consult Note (Addendum)
Radiation Oncology         [phone] ________________________________  Name: Sharon [name] MRN: [id number]  Date: 09/28/16  DOB: 03/25/1956  ZH:[id number], Jake Church, PA  No ref. provider found     REFERRING PHYSICIAN: No ref. provider found   DIAGNOSIS: The encounter diagnosis was Seizure [facility]).   HISTORY OF PRESENT ILLNESS: Sharon [name] is a 61 y.o. female seen at the request of Dr. 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 She completed systemic therapy with cisplatin and etoposide x 6 cycles and went on to receive PCI to the brain in January 2018. She was symptomatic from coughing and an episode of hemoptysis, and was found to have a large lesion in the left upper lobe of the lung and subsequently received radiotherapy to the left chest in May 2018 which she just recently finished. She developed progressive weakness, word finding difficulty, and tremor and also had an episode of grand mal seizure activity about one month ago. She presented to the ED yesterday with these symptoms, and was found on CT to have an ill defined brain lesion with edema and subsequent MRI of the brain yesterday evening revealed a 2.3 x 2.2 x 2.2 cm enhancing mass in the left frontal/parietal region consistent with metastatic disease. Extensive vasogenic edema and 2 mm left to right shift was noted. A punctate focus of enhancement along the posterior left parietal lobe was also noted. No other mass lesions were seen. She was started on Dexamethasone 4 mg Q6 hours. We are asked to see her for salvage options of radiotherapy.     PREVIOUS RADIATION THERAPY: Yes   05/04/16-05/18/16 PCI: 25 Gy in 10 fractions to the whole brain  5*9/18-5/29/18: palliative radiotherapy to the chest 35 Gy in 14 fractions   PAST MEDICAL HISTORY:  Past Medical History:  Diagnosis Date  . Cancer of right breast ([place]) 2009  . CAP (community acquired pneumonia)  11/06/2015  . COPD (chronic obstructive pulmonary disease) ([place])   . Elevated blood pressure   . Encounter for antineoplastic chemotherapy 01/05/2016  . History of radiation therapy 05/04/16-05/18/16   whole brain 25 Gy in 10 fractions  . Hoarseness of voice    "for the last 2 months" (11/06/2015)  . Hypertension   . Lung mass dx'd 10/2015  . Menopause   . Personal history of breast cancer 03/29/2016  . Pre-diabetes   . Small cell lung cancer ([place])   . Vitamin D deficiency        PAST SURGICAL HISTORY: Past Surgical History:  Procedure Laterality Date  . BREAST BIOPSY Right 2009  . BREAST LUMPECTOMY Right 2009  . UTERINE FIBROID SURGERY  2000  . VAGINAL HYSTERECTOMY  2000  . VESICOVAGINAL FISTULA CLOSURE W/ TAH  2000  . VIDEO BRONCHOSCOPY WITH ENDOBRONCHIAL ULTRASOUND N/A 11/10/2015   Procedure: VIDEO BRONCHOSCOPY WITH ENDOBRONCHIAL ULTRASOUND;  Surgeon: Javier Glazier, MD;  Location: [place];  Service: Thoracic;  Laterality: N/A;     FAMILY HISTORY:  Family History  Problem Relation Age of Onset  . Adopted: Yes  . Cancer Neg Hx      SOCIAL HISTORY:  reports that she quit smoking about 10 months ago. Her smoking use included Cigarettes. She has a 4.60 pack-year smoking history. She has never used smokeless tobacco. She reports that she uses drugs, including Marijuana. She reports that she does not drink alcohol. The patient is  divorced and lives in [place]. 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    ALLERGIES: Patient has no known allergies.   MEDICATIONS:  Current Facility-Administered Medications  Medication Dose Route Frequency Provider Last Rate Last Dose  . 0.9 %  sodium chloride infusion   Intravenous Continuous Lacretia Leigh, MD 20 mL/hr at 09/28/16 0504    . acetaminophen (TYLENOL) tablet 650 mg  650 mg Oral Q6H PRN Rise Patience, MD       Or  . acetaminophen (TYLENOL) suppository 650 mg  650 mg Rectal Q6H PRN Rise Patience, MD      . albuterol  (PROVENTIL) (2.5 MG/3ML) 0.083% nebulizer solution 2.5 mg  2.5 mg Nebulization Q4H PRN Rise Patience, MD      . dexamethasone (DECADRON) injection 4 mg  4 mg Intravenous Q6H Rise Patience, MD   4 mg at 09/28/16 1112  . enoxaparin (LOVENOX) injection 40 mg  40 mg Subcutaneous Q24H Rise Patience, MD   40 mg at 09/28/16 1112  . feeding supplement (ENSURE ENLIVE) (ENSURE ENLIVE) liquid 237 mL  237 mL Oral BID BM Rise Patience, MD   237 mL at 09/28/16 1047  . levETIRAcetam (KEPPRA) 500 mg in sodium chloride 0.9 % 100 mL IVPB  500 mg Intravenous Q12H Rise Patience, MD   Stopped at 09/28/16 [phone]  . meclizine (ANTIVERT) tablet 12.5-25 mg  12.5-25 mg Oral TID PRN Rise Patience, MD      . mometasone-formoterol [facility]) 200-5 MCG/ACT inhaler 2 puff  2 puff Inhalation BID Rise Patience, MD      . ondansetron [facility]) tablet 4 mg  4 mg Oral Q6H PRN Rise Patience, MD       Or  . ondansetron [facility]) injection 4 mg  4 mg Intravenous Q6H PRN Rise Patience, MD      . prochlorperazine (COMPAZINE) tablet 5 mg  5 mg Oral Q8H PRN Rise Patience, MD      . Vitamin D (Ergocalciferol) (DRISDOL) capsule 50,000 Units  50,000 Units Oral Daily Rise Patience, MD   50,000 Units at 09/28/16 1111     REVIEW OF SYSTEMS: On review of systems, the patient reports that she is doing better since having steroids. She reports she feels like her word finding is improved, and that she's not having tremor or uncontrolled shaking that she previously noted. She denies any chest pain, shortness of breath, cough, fevers, chills, night sweats, unintended weight changes. She denies any bowel or bladder disturbances, and denies abdominal pain, nausea or vomiting. She denies any new musculoskeletal or joint aches or pains. A complete review of systems is obtained and is otherwise negative.     PHYSICAL EXAM:  Wt Readings from Last 3 Encounters:  09/28/16 242 lb (109.8  kg)  09/21/16 242 lb 3.2 oz (109.9 kg)  09/14/16 246 lb 6.4 oz (111.8 kg)   Temp Readings from Last 3 Encounters:  09/28/16 98.5 F (36.9 C) (Oral)  09/21/16 99.1 F (37.3 C) (Oral)  09/14/16 98.2 F (36.8 C) (Oral)   BP Readings from Last 3 Encounters:  09/28/16 (!) 101/40  09/21/16 118/79  09/14/16 117/82   Pulse Readings from Last 3 Encounters:  09/28/16 72  09/21/16 79  09/14/16 (!) 101   Pain Assessment Pain Score: 0-No pain/10  In general this is a well appearing caucasian female in no acute distress. She is alert and oriented x4 and appropriate throughout the examination. HEENT reveals  that the patient is normocephalic, atraumatic. EOMs are intact. PERRLA. Skin is intact without any evidence of gross lesions. Cardiovascular exam reveals a regular rate and rhythm, no clicks rubs or murmurs are auscultated. Chest is clear to auscultation bilaterally. The abdomen is soft, non tender, non distended. Lower extremities are negative for pretibial pitting edema, deep calf tenderness, cyanosis or clubbing. Cranial nerves II-XII are grossly intact.   ECOG = 2  0 - Asymptomatic (Fully active, able to carry on all predisease activities without restriction)  1 - Symptomatic but completely ambulatory (Restricted in physically strenuous activity but ambulatory and able to carry out work of a light or sedentary nature. For example, light housework, office work)  2 - Symptomatic, <50% in bed during the day (Ambulatory and capable of all self care but unable to carry out any work activities. Up and about more than 50% of waking hours)  3 - Symptomatic, >50% in bed, but not bedbound (Capable of only limited self-care, confined to bed or chair 50% or more of waking hours)  4 - Bedbound (Completely disabled. Cannot carry on any self-care. Totally confined to bed or chair)  5 - Death   Eustace Pen MM, Creech RH, Tormey DC, et al. [phone]). "Toxicity and response criteria of the [facility]". [place] Oncol. 5 (6): 649-55    LABORATORY DATA:  Lab Results  Component Value Date   WBC 8.5 09/28/2016   HGB 12.8 09/28/2016   HCT 37.1 09/28/2016   MCV 96.4 09/28/2016   PLT 304 09/28/2016   Lab Results  Component Value Date   NA 137 09/28/2016   K 4.3 09/28/2016   CL 104 09/28/2016   CO2 24 09/28/2016   Lab Results  Component Value Date   ALT 36 09/27/2016   AST 34 09/27/2016   ALKPHOS 79 09/27/2016   BILITOT 0.5 09/27/2016      RADIOGRAPHY: Ct Head Wo Contrast  Result Date: 09/27/2016 CLINICAL DATA:  Per patient, states seizure activity after radiation-states last week she was found by son down-states she had bit tongue and went incontinent of urine. History of breast cancer. EXAM: CT HEAD WITHOUT CONTRAST TECHNIQUE: Contiguous axial images were obtained from the base of the skull through the vertex without intravenous contrast. COMPARISON:  MR brain 04/11/2016 FINDINGS: Brain: No evidence of acute infarction, hemorrhage, hydrocephalus or extra-axial collection. 15 mm lesion in the high a left frontal lobe with severe surrounding vasogenic edema. Mild mass effect on the left frontal horn of the lateral ventricle. No other lesions are identified on this noncontrast examination. Vascular: No hyperdense vessel or unexpected calcification. Skull: No osseous abnormality. Sinuses/Orbits: Visualized paranasal sinuses are clear. Visualized mastoid sinuses are clear. Visualized orbits demonstrate no focal abnormality. Other: None IMPRESSION: 1. 15 mm ill-defined brain lesion in the high left frontal lobe with severe surrounding vasogenic edema most concerning for metastatic disease. Recommend further evaluation with an MRI of the brain without and with intravenous contrast. These results were called by telephone at the time of interpretation on 09/27/2016 at 6:52 pm to Dr. Zenia Resides , who verbally acknowledged these results. Electronically Signed   By: Kathreen Devoid   On:  09/27/2016 18:54   Mr Brain W And Wo Contrast  Result Date: 09/27/2016 CLINICAL DATA:  Initial evaluation for intracranial metastatic disease. EXAM: MRI HEAD WITHOUT AND WITH CONTRAST TECHNIQUE: Multiplanar, multiecho pulse sequences of the brain and surrounding structures were obtained without and with intravenous contrast. CONTRAST:  43mL MULTIHANCE  GADOBENATE DIMEGLUMINE 529 MG/ML IV SOLN COMPARISON:  Prior CT from earlier same day. FINDINGS: Brain: Study mildly degraded by motion artifact. Cerebral volume within normal limits for age. Few scattered subcentimeter T2/FLAIR hyperintense foci noted within the periventricular and deep white matter both cerebral hemispheres, nonspecific, but felt to be within normal limits for age. No abnormal foci of restricted diffusion to suggest acute or subacute ischemia. No areas of chronic infarction identified. 2.3 x 2.2 x 2.2 cm heterogeneously enhancing mass centered along the gray-white matter differentiation at the left frontal parietal region (series 12, image 41), most consistent with a probable intracranial metastasis. Extensive vasogenic edema throughout the adjacent left frontal parietal region. Trace 2 mm left-to-right shift. No hydrocephalus or ventricular trapping. No other definite lesions identified at this time. A punctate focus of apparent enhancement at the posterior left parietal lobe on axial post-contrast sequence noted (series 12, image 32), not seen on corresponding coronal or sagittal sequences. Artifact is favored. No other abnormal enhancement. No other mass lesion. Few small foci of susceptibility artifact noted within the right parietal lobe and left frontotemporal region (series 9, image 52). No associated enhancement. No other evidence for acute or chronic intracranial hemorrhage. Major dural sinuses are grossly patent. Pituitary gland within normal limits. Vascular: Major intracranial vascular flow voids are maintained. Skull and upper cervical  spine: Craniocervical junction within normal limits. Visualized upper cervical spine unremarkable. Bone marrow signal intensity within normal limits. No scalp soft tissue abnormality. Sinuses/Orbits: Globes and orbital soft tissues within normal limits. Mild right sphenoid sinus disease noted. Paranasal sinuses are otherwise clear. Small right mastoid effusion noted. Inner ear structures normal. Other: None. IMPRESSION: 1. 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 Associated vasogenic edema with trace 2 mm left-to-right shift. 2. Additional punctate focus of nodular enhancement within the left parietal lobe as above. Artifact is favored, although attention at follow-up is recommended. Electronically Signed   By: Jeannine Boga M.D.   On: 09/27/2016 22:49       IMPRESSION/PLAN: 1. 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 The patient is counseled on the findings from her brain imaging. We discussed the rationale for the use of salvage radiotherapy. She appears to be a good candidate for stereotactic radiosurgery [facility]), but we would need to confirm this with a 3T MRI. She had her scan last night at 2100, and we will need to wait per MRI protocol for this to be done about 48 hours or longer after last administration of gadolinium. She states understanding. If she is still in the hospital at that time we will coordinate for carelink transport to [facility], but if she's outpatient we will coordinate this as well. We would continue steroids at the current dosing. We discussed if she was a candidate for SRS the typical protocol and process including logistics and delivery of treatment. We would anticipate one fraction of therapy. We discussed the risks, benefits, short, and long term effects of radiotherapy. She would like to move forward. Her daughter was also in on the phone call by speaker phone.  We will plan simulation as well and subsequent meeting with neurosurgery and for her treatment as an outpatient. We will plan to taper her steroids once she's finished radiotherapy. 2. Claustrophobia. I've called in a prescription for Ativan to the patient's outpatient pharmacy to be used 30 minutes prior to MRI or radiotherapy treatment.  3. Seizure  activity. The patient will be followed by Dr. Shon Hale in Neurology as an outpatient for management of her [place].   In a visit lasting 70 minutes, greater than 50% of the time was spent face to face discussing her imaging findings, communicating with her family, and coordinating the patient's care with her primary team.     Carola Rhine, PAC

## 2016-09-28 NOTE — Progress Notes (Signed)
Patient seen and examined at bedside, patient admitted after midnight, please see earlier detailed admission note by Rise Patience, MD. Briefly, patient presented with possible seizure and found to have brain lesions likely secondary to metastatic cancer and edema with mild shift. Started on decadron and keppra. Dr. Shon Hale, neurology, consulted and recommending Keppra. Will consult Oncology, Dr. Julien Nordmann and radiation oncologist. Will also consult palliative care (this was discussed with the patient).   Cordelia Poche, MD [facility] 09/28/2016, 11:04 AM Pager: [phone]

## 2016-09-29 ENCOUNTER — Inpatient Hospital Stay (HOSPITAL_COMMUNITY)
Admit: 2016-09-29 | Discharge: 2016-09-29 | Disposition: A | Discharge status: Home / Self Care | Payer: 59 | Attending: Radiation Oncology | Admitting: Radiation Oncology

## 2016-09-29 ENCOUNTER — Telehealth: Payer: Self-pay | Admitting: *Deleted

## 2016-09-29 ENCOUNTER — Ambulatory Visit: Payer: 59 | Admitting: Internal Medicine

## 2016-09-29 DIAGNOSIS — Z515 Encounter for palliative care: Secondary | ICD-10-CM

## 2016-09-29 DIAGNOSIS — Z7189 Other specified counseling: Secondary | ICD-10-CM

## 2016-09-29 DIAGNOSIS — D496 Neoplasm of unspecified behavior of brain: Secondary | ICD-10-CM

## 2016-09-29 LAB — CBC WITH DIFFERENTIAL/PLATELET
Basophils Absolute: 0 10*3/uL (ref 0.0–0.1)
Basophils Relative: 0 %
EOS ABS: 0 10*3/uL (ref 0.0–0.7)
EOS PCT: 0 %
HCT: 39.7 % (ref 36.0–46.0)
HEMOGLOBIN: 13.6 g/dL (ref 12.0–15.0)
LYMPHS ABS: 0.6 10*3/uL — AB (ref 0.7–4.0)
Lymphocytes Relative: 3 %
MCH: 33 pg (ref 26.0–34.0)
MCHC: 34.3 g/dL (ref 30.0–36.0)
MCV: 96.4 fL (ref 78.0–100.0)
MONOS PCT: 1 %
Monocytes Absolute: 0.2 10*3/uL (ref 0.1–1.0)
NEUTROS PCT: 96 %
Neutro Abs: 17.6 10*3/uL — ABNORMAL HIGH (ref 1.7–7.7)
PLATELETS: 332 10*3/uL (ref 150–400)
RBC: 4.12 MIL/uL (ref 3.87–5.11)
RDW: 13.5 % (ref 11.5–15.5)
WBC: 18.4 10*3/uL — AB (ref 4.0–10.5)

## 2016-09-29 LAB — BASIC METABOLIC PANEL
ANION GAP: 11 (ref 5–15)
BUN: 13 mg/dL (ref 6–20)
CALCIUM: 9.9 mg/dL (ref 8.9–10.3)
CO2: 23 mmol/L (ref 22–32)
CREATININE: 0.7 mg/dL (ref 0.44–1.00)
Chloride: 105 mmol/L (ref 101–111)
GLUCOSE: 226 mg/dL — AB (ref 65–99)
Potassium: 4 mmol/L (ref 3.5–5.1)
Sodium: 139 mmol/L (ref 135–145)

## 2016-09-29 LAB — MAGNESIUM: Magnesium: 1.8 mg/dL (ref 1.7–2.4)

## 2016-09-29 MED ORDER — LEVETIRACETAM 500 MG PO TABS
500.0000 mg | ORAL_TABLET | Freq: Two times a day (BID) | ORAL | Status: DC
  Administered 2016-09-30 – 2016-10-01 (×3): 500 mg via ORAL
  Filled 2016-09-29 (×3): qty 1

## 2016-09-29 MED ORDER — VITAMIN D (ERGOCALCIFEROL) 1.25 MG (50000 UNIT) PO CAPS: 50000.0000 [IU] | ORAL_CAPSULE | ORAL | Status: DC

## 2016-09-29 NOTE — Telephone Encounter (Signed)
CALLED PATIENT TO INFORM OF FU ON 10-25-16 WITH DR. KINARD @ 3:15 PM, SPOKE WITH PATIENT AND SHE IS AWARE OF THIS APPT.

## 2016-09-29 NOTE — Progress Notes (Signed)
PROGRESS NOTE    Sharon [name]  DQQ:[id number] DOB: 12/22/1955 DOA: 09/27/2016 PCP: [name], PA    Brief Narrative:   Sharon [name] is a 61 y.o. female with history of extensive stage small cell lung cancer was referred to the ER after patient was found to have increasing right upper extremity jerking movements and also difficulty ambulating. Patient states his symptoms gradually worsened over the last few months. Last few days it has worsened. Denies losing consciousness but has had a fall. Patient states she had followed up with her radiation oncologist and was referred to the ER.  ED Course: In the ER patient MRI brain shows left frontoparietal mass with vasogenic edema. ER physician had discussed with on-call oncologist Dr. Irene Limbo who advised patient to be placed on Decadron IV and start Keppra. Patient has been admitted for further observation. On my exam patient is able to move all extremities without any difficulty.   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 Patient has been started on IV Decadron as well as IV Keppra. Patient with no further seizures. Oncology and radiation oncology consulted. Patient to have repeat MRI of the brain with SRS protocol followed by stereotactic radiotherapy to the solitary brain metastases. Oncology following. Will defer restaging of his disease to oncology. Follow.  #2 seizure activity Secondary to brain metastases. Patient currently on IV Keppra with no further seizures. Will transition to oral Keppra. Outpatient follow-up with neurology.  #3  COPD Stable.  No wheezing. Continue dulera. Add Spiriva. Follow.  #4 history of breast cancer  #5 leukocytosis Likely secondary to steroids. Follow.   DVT prophylaxis: Lovenox Code Status: Full Family Communication: Updated patient. No family at bedside. Disposition Plan: Likely home versus SNF. Per radiation oncology and oncology.   Consultants:   Oncology: Dr. Lorna Few 09/28/2016  Palliative care 09/29/2016  Radiation oncology Shona Simpson PA 09/28/2016  Procedures:   CT head 09/27/2016  MRI head 09/27/2016  EEG 09/28/2016  Antimicrobials:   None   Subjective: Patient sitting up in bed eating lunch. No chest pain. No shortness of breath. No further upper extremity arm jerks. No seizures noted. Patient states she's feeling better.  Objective: Vitals:   09/28/16 2202 09/29/16 0540 09/29/16 0829 09/29/16 1312  BP:  (!) 106/54  134/76  Pulse:  70  68  Resp:  18  18  Temp:  98.6 F (37 C)  97.9 F (36.6 C)  TempSrc:  Oral  Oral  SpO2: 96% 97% 95% 96%  Weight:      Height:        Intake/Output Summary (Last 24 hours) at 09/29/16 1335 Last data filed at 09/29/16 0544  Gross per 24 hour  Intake              220 ml  Output              700 ml  Net             -480 ml   Filed Weights   09/28/16 0146  Weight: 109.8 kg (242 lb)  Examination:  General exam: Appears calm and comfortable  Respiratory system: Clear to auscultation. Respiratory effort normal. Cardiovascular system: S1 & S2 heard, Tachycardia. No JVD, murmurs, rubs, gallops or clicks. No pedal edema. Gastrointestinal system: Abdomen is nondistended, soft and nontender. No organomegaly or masses felt. Normal bowel sounds heard. Central nervous system: Alert and oriented. No focal neurological deficits. Extremities: Symmetric 5 x 5 power. Skin: No rashes, lesions or ulcers Psychiatry: Judgement and insight appear normal. Mood & affect appropriate.     Data Reviewed: I have personally  reviewed following labs and imaging studies  CBC:  Recent Labs Lab 09/27/16 1319 09/27/16 1611 09/27/16 1622 09/28/16 0656 09/29/16 1013  WBC 10.1 10.8*  --  8.5 18.4*  NEUTROABS 7.9* 7.7  --   --  17.6*  HGB 13.4 13.6 13.9 12.8 13.6  HCT 40.2 39.4 41.0 37.1 39.7  MCV 98.3 96.3  --  96.4 96.4  PLT 279 301  --  304 371   Basic Metabolic Panel:  Recent Labs Lab 09/27/16 1319 09/27/16 1611 09/27/16 1622 09/28/16 0656 09/29/16 1013  NA 137 137 138 137 139  K 3.9 3.8 3.8 4.3 4.0  CL  --  103 99* 104 105  CO2 24 26  --  24 23  GLUCOSE 177* 141* 140* 180* 226*  BUN 11.7 12 11 12 13   CREATININE 0.8 0.75 0.70 0.70  0.68 0.70  CALCIUM 10.0 9.6  --  9.6 9.9  MG  --   --   --   --  1.8   GFR: Estimated Creatinine Clearance: 95 mL/min (by C-G formula based on SCr of 0.7 mg/dL). Liver Function Tests:  Recent Labs Lab 09/27/16 1319 09/27/16 1611  AST 27 34  ALT 34 36  ALKPHOS 80 79  BILITOT 0.38 0.5  PROT 7.1 7.5  ALBUMIN 3.6 4.1   No results for input(s): LIPASE, AMYLASE in the last 168 hours. No results for input(s): AMMONIA in the last 168 hours. Coagulation Profile:  Recent Labs Lab 09/27/16 1611  INR 0.99   Cardiac Enzymes: No results for input(s): CKTOTAL, CKMB, CKMBINDEX, TROPONINI in the last 168 hours. BNP (last 3 results) No results for input(s): PROBNP in the last 8760 hours. HbA1C: No results for input(s): HGBA1C in the last 72 hours. CBG:  Recent Labs Lab 09/27/16 1657  GLUCAP 96   Lipid Profile: No results for input(s): CHOL, HDL, LDLCALC, TRIG, CHOLHDL, LDLDIRECT in the last 72 hours. Thyroid Function Tests: No results for input(s): TSH, T4TOTAL, FREET4, T3FREE, THYROIDAB in the last 72 hours. Anemia Panel: No results for input(s): VITAMINB12, FOLATE, FERRITIN, TIBC, IRON, RETICCTPCT in the last 72 hours. Sepsis Labs: No results for input(s): PROCALCITON, LATICACIDVEN in the last 168 hours.  No results found for this or any previous  visit (from the past 240 hour(s)).       Radiology Studies: Ct Head Wo Contrast  Result Date: 09/27/2016 CLINICAL DATA:  Per patient, states seizure activity after radiation-states last week she was found by son down-states she had bit tongue and went incontinent of urine. History of breast cancer. EXAM: CT HEAD WITHOUT CONTRAST TECHNIQUE: Contiguous axial images were obtained from the base of the skull through the vertex without intravenous contrast. COMPARISON:  MR brain 04/11/2016 FINDINGS: Brain: No evidence of acute infarction, hemorrhage, hydrocephalus or extra-axial collection. 15 mm lesion in the high a left frontal lobe with severe surrounding vasogenic edema. Mild mass effect on the left frontal horn of the lateral ventricle. No  other lesions are identified on this noncontrast examination. Vascular: No hyperdense vessel or unexpected calcification. Skull: No osseous abnormality. Sinuses/Orbits: Visualized paranasal sinuses are clear. Visualized mastoid sinuses are clear. Visualized orbits demonstrate no focal abnormality. Other: None IMPRESSION: 1. 15 mm ill-defined brain lesion in the high left frontal lobe with severe surrounding vasogenic edema most concerning for metastatic disease. Recommend further evaluation with an MRI of the brain without and with intravenous contrast. These results were called by telephone at the time of interpretation on 09/27/2016 at 6:52 pm to Dr. Zenia Resides , who verbally acknowledged these results. Electronically Signed   By: Kathreen Devoid   On: 09/27/2016 18:54   Mr Brain W And Wo Contrast  Result Date: 09/27/2016 CLINICAL DATA:  Initial evaluation for intracranial metastatic disease. EXAM: MRI HEAD WITHOUT AND WITH CONTRAST TECHNIQUE: Multiplanar, multiecho pulse sequences of the brain and surrounding structures were obtained without and with intravenous contrast. CONTRAST:  16mL MULTIHANCE GADOBENATE DIMEGLUMINE 529 MG/ML IV SOLN COMPARISON:  Prior CT from earlier same  day. FINDINGS: Brain: Study mildly degraded by motion artifact. Cerebral volume within normal limits for age. Few scattered subcentimeter T2/FLAIR hyperintense foci noted within the periventricular and deep white matter both cerebral hemispheres, nonspecific, but felt to be within normal limits for age. No abnormal foci of restricted diffusion to suggest acute or subacute ischemia. No areas of chronic infarction identified. 2.3 x 2.2 x 2.2 cm heterogeneously enhancing mass centered along the gray-white matter differentiation at the left frontal parietal region (series 12, image 41), most consistent with a probable intracranial metastasis. Extensive vasogenic edema throughout the adjacent left frontal parietal region. Trace 2 mm left-to-right shift. No hydrocephalus or ventricular trapping. No other definite lesions identified at this time. A punctate focus of apparent enhancement at the posterior left parietal lobe on axial post-contrast sequence noted (series 12, image 32), not seen on corresponding coronal or sagittal sequences. Artifact is favored. No other abnormal enhancement. No other mass lesion. Few small foci of susceptibility artifact noted within the right parietal lobe and left frontotemporal region (series 9, image 52). No associated enhancement. No other evidence for acute or chronic intracranial hemorrhage. Major dural sinuses are grossly patent. Pituitary gland within normal limits. Vascular: Major intracranial vascular flow voids are maintained. Skull and upper cervical spine: Craniocervical junction within normal limits. Visualized upper cervical spine unremarkable. Bone marrow signal intensity within normal limits. No scalp soft tissue abnormality. Sinuses/Orbits: Globes and orbital soft tissues within normal limits. Mild right sphenoid sinus disease noted. Paranasal sinuses are otherwise clear. Small right mastoid effusion noted. Inner ear structures normal. Other: None. IMPRESSION: 1. 2.3 x 2.2  x 2.2 cm left frontoparietal mass as above, most likely reflecting a solitary intracranial metastasis. Associated vasogenic edema with trace 2 mm left-to-right shift. 2. Additional punctate focus of nodular enhancement within the left parietal lobe as above. Artifact is favored, although attention at follow-up is recommended. Electronically Signed   By: Jeannine Boga M.D.   On: 09/27/2016 22:49        Scheduled Meds: . dexamethasone  4 mg Intravenous Q6H  . enoxaparin (LOVENOX) injection  40 mg Subcutaneous Q24H  . mometasone-formoterol  2 puff Inhalation BID  . protein supplement shake  11 oz Oral BID BM  . [START ON 10/05/2016] Vitamin D (Ergocalciferol)  50,000 Units Oral Q7 days   Continuous Infusions: . sodium chloride 20 mL/hr at 09/28/16 0504  . levETIRAcetam Stopped (09/29/16 0559)     LOS: 1 day  Time spent: 8 minutes    Earnest Mcgillis, MD [facility] Pager [phone]  If 7PM-7AM, please contact night-coverage [email or web address] Password [email or web address] 09/29/2016, 1:35 PM

## 2016-09-29 NOTE — Consult Note (Signed)
Consultation Note Date: 09/29/2016   Patient Name: Sharon [name]  DOB: 1955-05-05  MRN: [id number]  Age / Sex: 61 y.o., female  PCP: [name], PA Referring Physician: Eugenie Filler, MD  Reason for Consultation: Establishing goals of care  HPI/Patient Profile: 61 y.o. female  with past medical history of small cell lung cancer admitted on 09/27/2016 with seizure like activity and found to have a left frontoparietal mass with vasogenic edema.  Awaiting T3 MRI for planning for possible SRS.  Palliative consulted for goals of care.   Clinical Assessment and Goals of Care: I met today with Sharon [name].   She reports that the most important things to her are her faith and her family.  She feels that her doctors have been doing a good job explaining things to her and she understands that she has incurable disease that continues to worsen with decline in her cognition and functional status.  We discussed clinical course as well as wishes moving forward in regard to advanced directives.  Values and goals of care important to patient and family were attempted to be elicited.  Concepts specific to code status and clinical course this hospitalization discussed.  We discussed difference between a aggressive medical intervention path and a palliative, comfort focused care path and how her goals regarding this may continue to change as her disease progresses.    Questions and concerns addressed.   PMT will continue to support holistically.  SUMMARY OF RECOMMENDATIONS   - Discussed with patient regarding advance care planning.  We discussed code status, goal of medical therapies, and naming surrogate decision maker.  - She wants to complete SRS, have restaging scan, and discuss further with her children prior to making any changes to her care plan.  Code Status/Advance Care Planning:  Full code  Additional  Recommendations (Limitations, Scope, Preferences):  Full Scope Treatment  Psycho-social/Spiritual:   Desire for further Chaplaincy support:no  Additional Recommendations: Caregiving  Support/Resources  Prognosis:   Unable to determine  Discharge Planning: To Be Determined      Primary Diagnoses: Present on Admission: . Brain metastasis ([place]) . Primary small cell carcinoma of left lung ([place]) . COPD, severe ([place])   I have reviewed the medical record, interviewed the patient and family, and examined the patient. The following aspects are pertinent.  Past Medical History:  Diagnosis Date  . Cancer of right breast ([place]) 2009  . CAP (community acquired pneumonia) 11/06/2015  . COPD (chronic obstructive pulmonary disease) ([place])   . Elevated blood pressure   . Encounter for antineoplastic chemotherapy 01/05/2016  . History of radiation therapy 05/04/16-05/18/16   whole brain 25 Gy in 10 fractions  . Hoarseness of voice    "for the last 2 months" (11/06/2015)  . Hypertension   . Lung mass dx'd 10/2015  . Menopause   . Personal history of breast cancer 03/29/2016  . Pre-diabetes   . Small cell lung cancer ([place])   . Vitamin D deficiency    Social History  Social History  . Marital status: Divorced    Spouse name: N/A  . Number of children: 3  . 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 Smoking status: Former Smoker    Packs/day: 0.10    Years: 46.00    Types: Cigarettes    Quit date: 11/11/2015  . Smokeless tobacco: Never Used     Comment: Peak rate of 2ppd  . Alcohol use No  . Drug use: Yes    Types: Marijuana     Comment: 11/06/2015 "1-2 times/week when I do smoke; none lately"  . Sexual activity: Yes   Other Topics Concern  . None   Social History Narrative  . None   Family History  Problem Relation Age of Onset  . Adopted: Yes  . Cancer Neg Hx    Scheduled Meds: . dexamethasone  4 mg Intravenous Q6H    . enoxaparin (LOVENOX) injection  40 mg Subcutaneous Q24H  . mometasone-formoterol  2 puff Inhalation BID  . protein supplement shake  11 oz Oral BID BM  . [START ON 10/05/2016] Vitamin D (Ergocalciferol)  50,000 Units Oral Q7 days   Continuous Infusions: . sodium chloride 20 mL/hr at 09/28/16 0504  . levETIRAcetam Stopped (09/29/16 0559)   PRN Meds:.acetaminophen **OR** acetaminophen, albuterol, LORazepam, meclizine, ondansetron **OR** ondansetron (ZOFRAN) IV, prochlorperazine Medications Prior to Admission:  Prior to Admission medications   Medication Sig Start Date End Date Taking? Authorizing Provider  albuterol (PROVENTIL HFA;VENTOLIN HFA) 108 (90 Base) MCG/ACT inhaler Inhale 1 puff into the lungs every 6 (six) hours as needed for wheezing or shortness of breath.   Yes [provider]  budesonide-formoterol (SYMBICORT) 160-4.5 MCG/ACT inhaler Inhale 2 puffs into the lungs 2 (two) times daily. 08/17/16  Yes Javier Glazier, MD  Cholecalciferol (VITAMIN D3) 50000 units CAPS Take 1 tablet by mouth once a week.  07/21/16 11/18/16 Yes [provider]  emollient (BIAFINE) cream Apply topically 2 (two) times daily.   Yes [provider]  meclizine (ANTIVERT) 12.5 MG tablet Take 1-2 tablets (12.5-25 mg total) by mouth 3 (three) times daily as needed for dizziness. 06/05/16  Yes Virgel Manifold, MD  prochlorperazine (COMPAZINE) 10 MG tablet TAKE 1 TABLET BY MOUTH EVERY 6 HOURS AS NEEDED FOR NAUSEA AND VOMITING 03/12/16  Yes Curt Bears, MD  UNABLE TO FIND Take 10 mLs by mouth daily as needed. Med Name: CBD Oil   Yes [provider]  LORazepam (ATIVAN) 0.5 MG tablet 1 tablet po 30 minutes prior to radiation or MRI 09/28/16   Hayden Pedro, PA-C   No Known Allergies Review of Systems  Constitutional: Positive for activity change, appetite change and fatigue.  Neurological: Positive for weakness.  Psychiatric/Behavioral: Positive for confusion.    Physical Exam  General: Alert, awake, in no acute distress.  HEENT: No bruits, no goiter, no JVD Heart: Regular rate and rhythm. No murmur appreciated. Lungs: Good air movement, clear Abdomen: Soft, nontender, nondistended, positive bowel sounds.  Ext: No significant edema Skin: Warm and dry Neuro: Grossly intact, nonfocal.  Vital Signs: BP (!) 106/54 (BP Location: Right Arm)   Pulse 70   Temp 98.6 F (37 C) (Oral)   Resp 18   Ht _0  (1.676 m)   Wt 109.8 kg (242 lb)   SpO2 95%   BMI 39.06 kg/m  Pain Assessment: No/denies pain   Pain Score: 0-No pain   SpO2:  SpO2: 95 % O2 Device:SpO2: 95 % O2 Flow Rate: .   IO: Intake/output summary:  Intake/Output Summary (Last 24 hours) at 09/29/16 1027 Last data filed at 09/29/16 0544  Gross per 24 hour  Intake              220 ml  Output              950 ml  Net             -730 ml    LBM: Last BM Date: 09/29/16 Baseline Weight: Weight: 109.8 kg (242 lb) Most recent weight: Weight: 109.8 kg (242 lb)     Palliative Assessment/Data:   Flowsheet Rows     Most Recent Value  Intake Tab  Referral Department  Hospitalist  Unit at Time of Referral  Med/Surg Unit  Palliative Care Primary Diagnosis  Cancer  Date Notified  09/28/16  Palliative Care Type  New Palliative care  Reason for referral  Clarify Goals of Care  Date of Admission  09/27/16  Date first seen by Palliative Care  09/29/16  # of days Palliative referral response time  1 Day(s)  # of days IP prior to Palliative referral  1  Clinical Assessment  Palliative Performance Scale Score  50%  Psychosocial & Spiritual Assessment  Palliative Care Outcomes  Patient/Family meeting held?  Yes  Who was at the meeting?  Patient  Palliative Care Outcomes  ACP counseling assistance, Clarified goals of care      Time In: 0930  Time Out: 1030 Time Total: 60 Greater than 50%  of this time was spent counseling and coordinating care related to the above assessment and  plan.  Signed by: Micheline Rough, MD   Please contact Palliative Medicine Team phone at [phone] for questions and concerns.  For individual provider: See Shea Evans

## 2016-09-30 DIAGNOSIS — J449 Chronic obstructive pulmonary disease, unspecified: Secondary | ICD-10-CM

## 2016-09-30 LAB — CBC WITH DIFFERENTIAL/PLATELET
BASOS ABS: 0 10*3/uL (ref 0.0–0.1)
Basophils Relative: 0 %
EOS PCT: 0 %
Eosinophils Absolute: 0 10*3/uL (ref 0.0–0.7)
HEMATOCRIT: 38.2 % (ref 36.0–46.0)
HEMOGLOBIN: 13.2 g/dL (ref 12.0–15.0)
LYMPHS ABS: 1.4 10*3/uL (ref 0.7–4.0)
LYMPHS PCT: 9 %
MCH: 33.1 pg (ref 26.0–34.0)
MCHC: 34.6 g/dL (ref 30.0–36.0)
MCV: 95.7 fL (ref 78.0–100.0)
Monocytes Absolute: 0.5 10*3/uL (ref 0.1–1.0)
Monocytes Relative: 4 %
NEUTROS ABS: 13.1 10*3/uL — AB (ref 1.7–7.7)
NEUTROS PCT: 87 %
PLATELETS: 317 10*3/uL (ref 150–400)
RBC: 3.99 MIL/uL (ref 3.87–5.11)
RDW: 13.6 % (ref 11.5–15.5)
WBC: 15.1 10*3/uL — AB (ref 4.0–10.5)

## 2016-09-30 LAB — BASIC METABOLIC PANEL
ANION GAP: 10 (ref 5–15)
BUN: 19 mg/dL (ref 6–20)
CHLORIDE: 104 mmol/L (ref 101–111)
CO2: 26 mmol/L (ref 22–32)
Calcium: 9.7 mg/dL (ref 8.9–10.3)
Creatinine, Ser: 0.61 mg/dL (ref 0.44–1.00)
GFR calc Af Amer: >60 mL/min (ref >60)
GLUCOSE: 110 mg/dL — AB (ref 65–99)
POTASSIUM: 3.8 mmol/L (ref 3.5–5.1)
Sodium: 140 mmol/L (ref 135–145)

## 2016-09-30 MED ORDER — DEXAMETHASONE 4 MG PO TABS
4.0000 mg | ORAL_TABLET | Freq: Four times a day (QID) | ORAL | Status: DC
  Administered 2016-09-30 – 2016-10-01 (×6): 4 mg via ORAL
  Filled 2016-09-30 (×6): qty 1

## 2016-09-30 MED ORDER — GADOBENATE DIMEGLUMINE 529 MG/ML IV SOLN
20.0000 mL | Freq: Once | INTRAVENOUS | Status: AC | PRN
  Administered 2016-09-30: 20 mL via INTRAVENOUS

## 2016-09-30 NOTE — Progress Notes (Signed)
Pt. Back on the unit reports she did fine with her MRI

## 2016-09-30 NOTE — Progress Notes (Signed)
PROGRESS NOTE    Sharon [name]  TML:[id number] DOB: [birth year] DOA: 09/27/2016 PCP: [name], PA    Brief Narrative:  61 yo female presented with right upper extremity jerking movements. Patient known to have small cell lung cancer. Worsening symptoms over last few months. No loss of consciousness. On the initial physical examination blood pressure 143/88, HR 85, RR 20, Temp. 97 with oxygen saturation 98%. Lungs clear to auscultation, heart s1 and s2 presents, abdomen soft and non tender, lower extremities with no edema. MRI with metastasis at the left frontoparietal mass with vasogenic edema. Patient admitted for further treatment and observation.    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 Solitary brain metastasis. No further jerking movements, will change to po Keppra, continue neuro checks per unit protocol. MRI following radiation, 2.1x1.9 x 2.0 mass with stable vasogenic edema, trace 2 mm shift left to right. Will follow recommendations from Oncology.   2. Seizures. Case discussed with neurology on admission with recommendations, for Keppra, no further seziure activity, EEG with no active seizure.   3.COPD. Will continue oxymetry monitoring, bronchodilator therapy.  4. Leukocytosis. Will continue to follow cell count, suspected to be reactive.      DVT prophylaxis: enoxaparin  Code Status: Full  Family Communication:No family at the bedside  Disposition Plan: Home   Consultants:   Oncology  Radiation oncology  Neurology (over the phone)  Procedures:    Antimicrobials:    Subjective: Patient had MRI this am, positive fatigue, no nausea or vomiting, no headache or visual changes.   Objective: Vitals:   09/29/16 1703 09/29/16 2139 09/29/16 2158 09/30/16 0335  BP:  (!) 155/72  131/68 (!) 138/99  Pulse: 77  72 68  Resp: 18  18 18   Temp: 98.2 F (36.8 C)  97.7 F (36.5 C) 97.7 F (36.5 C)  TempSrc: Oral  Oral Oral  SpO2: 95% 95% 97% 98%  Weight:      Height:        Intake/Output Summary (Last 24 hours) at 09/30/16 1219 Last data filed at 09/30/16 1200  Gross per 24 hour  Intake              360 ml  Output                0 ml  Net              360 ml   Filed Weights   09/28/16 0146  Weight: 109.8 kg (242 lb)    Examination:  General exam:not in pain or dyspnea E ENT. No pallor or icterus, oral mucosa moist.  Respiratory system: Clear to auscultation. Respiratory effort normal. Cardiovascular system: S1 & S2 heard, RRR. No JVD, murmurs, rubs, gallops or clicks. No pedal edema. Gastrointestinal system: Abdomen is nondistended, soft and nontender. No organomegaly or masses felt. Normal bowel sounds heard. Central nervous system: Alert and oriented. No focal neurological deficits. Extremities: Symmetric 5 x 5 power. Skin: No rashes, lesions or ulcers      Data Reviewed: I have personally reviewed following labs and imaging studies  CBC:  Recent Labs Lab 09/27/16 1319 09/27/16 1611 09/27/16 1622 09/28/16 0656 09/29/16 1013 09/30/16 0725  WBC 10.1 10.8*  --  8.5 18.4* 15.1*  NEUTROABS 7.9* 7.7  --   --  17.6* 13.1*  HGB 13.4 13.6 13.9 12.8 13.6 13.2  HCT 40.2 39.4 41.0 37.1 39.7 38.2  MCV 98.3 96.3  --  96.4 96.4 95.7  PLT 279 301  --  304 332 482   Basic Metabolic Panel:  Recent Labs Lab 09/27/16 1319 09/27/16 1611 09/27/16 1622 09/28/16 0656 09/29/16 1013 09/30/16 0725  NA 137 137 138 137 139 140  K 3.9 3.8 3.8 4.3 4.0 3.8  CL  --  103 99* 104 105 104  CO2 24 26  --  24 23 26   GLUCOSE 177* 141* 140* 180* 226* 110*  BUN 11.7 12 11 12 13 19   CREATININE 0.8 0.75 0.70 0.70  0.68 0.70 0.61  CALCIUM 10.0 9.6  --  9.6 9.9 9.7  MG  --   --   --   --  1.8  --    GFR: Estimated Creatinine Clearance: 95 mL/min (by  C-G formula based on SCr of 0.61 mg/dL). Liver Function Tests:  Recent Labs Lab 09/27/16 1319 09/27/16 1611  AST 27 34  ALT 34 36  ALKPHOS 80 79  BILITOT 0.38 0.5  PROT 7.1 7.5  ALBUMIN 3.6 4.1   No results for input(s): LIPASE, AMYLASE in the last 168 hours. No results for input(s): AMMONIA in the last 168 hours. Coagulation Profile:  Recent Labs Lab 09/27/16 1611  INR 0.99   Cardiac Enzymes: No results for input(s): CKTOTAL, CKMB, CKMBINDEX, TROPONINI in the last 168 hours. BNP (last 3 results) No results for input(s): PROBNP in the last 8760 hours. HbA1C: No results for input(s): HGBA1C in the last 72 hours. CBG:  Recent Labs Lab 09/27/16 1657  GLUCAP 96   Lipid Profile: No results for input(s): CHOL, HDL, LDLCALC, TRIG, CHOLHDL, LDLDIRECT in the last 72 hours. Thyroid Function Tests: No results for input(s): TSH, T4TOTAL, FREET4, T3FREE, THYROIDAB in the last 72 hours. Anemia Panel: No results for input(s): VITAMINB12, FOLATE, FERRITIN, TIBC, IRON, RETICCTPCT in the last 72 hours. Sepsis Labs: No results for input(s): PROCALCITON, LATICACIDVEN in the last 168 hours.  No results found for this or any previous visit (from the past 240 hour(s)).       Radiology Studies: Mr Jeri Cos LM Contrast  Result Date: 09/30/2016 CLINICAL DATA:  Initial evaluation for intracranial metastatic disease. SRS protocol for radiosurgery planning. EXAM: MRI HEAD WITHOUT AND WITH CONTRAST TECHNIQUE: Multiplanar, multiecho pulse sequences of the brain and surrounding structures were obtained without and with intravenous contrast. CONTRAST:  71mL MULTIHANCE GADOBENATE DIMEGLUMINE 529 MG/ML IV SOLN COMPARISON:  Recent MRI from 09/27/2016. FINDINGS: Brain: Previously identified solitary intracranial metastatic lesion centered at the left frontal parietal convexity again seen. Lesion measures 2.1 x 1.9 x 2.0 cm on today's exam (series 10, image 126). Internal susceptibility artifact  compatible with necrosis and/or blood products. No significant interval change in surrounding vasogenic edema. Trace 2 mm left-to-right shift stable. No hydrocephalus or ventricular trapping. No other metastatic lesions or abnormal enhancement identified. Previously noted subtle nodular enhancement at the left parietal lobe is not seen on today's exam. This was most likely artifactual on previous study. No other mass lesion. No evidence for acute infarct. Gray-white matter differentiation otherwise maintained. Minimal cerebral white matter disease again noted. Few scattered foci susceptibility fact again noted as well, stable. Pituitary gland suprasellar region within normal limits. Midline structures intact and normal. Vascular: Major intracranial vascular flow voids are well maintained. Skull and upper cervical spine:  Craniocervical junction within normal limits. Visualized upper cervical spine unremarkable. Bone marrow signal intensity normal. No scalp soft tissue abnormality. Sinuses/Orbits: Globes and orbital soft tissues within normal limits. Paranasal sinuses are clear. Trace right mastoid effusion. Inner ear structures normal. Other: None. IMPRESSION: 1. 2.1 x 1.9 x 2.0 cm left frontoparietal mass, most consistent with a solitary intracranial metastasis. Stable associated vasogenic edema with trace 2 mm of left-to-right shift. 2. No other intracranial metastases identified. Previously question punctate focus of nodular enhancement at the left parietal lobe is not seen on this exam. This was most likely artifactual on prior study. 3. No other acute intracranial process. Electronically Signed   By: Jeannine Boga M.D.   On: 09/30/2016 04:28        Scheduled Meds: . dexamethasone  4 mg Oral Q6H  . enoxaparin (LOVENOX) injection  40 mg Subcutaneous Q24H  . levETIRAcetam  500 mg Oral BID  . mometasone-formoterol  2 puff Inhalation BID  . protein supplement shake  11 oz Oral BID BM  . [START ON  10/05/2016] Vitamin D (Ergocalciferol)  50,000 Units Oral Q7 days   Continuous Infusions: . sodium chloride 20 mL/hr at 09/28/16 0504     LOS: 2 days       Tawni Millers, MD [facility] Pager [phone]  If 7PM-7AM, please contact night-coverage [email or web address] Password [email or web address] 09/30/2016, 12:19 PM

## 2016-09-30 NOTE — Progress Notes (Signed)
We reviewed the patient's MRI with Dr. Lisbeth Renshaw and she appears to be a candidate for [facility] treatment. We will plan for simulation tomorrow if she's still inpatient, or as an outpatient if she discharges sooner.     Carola Rhine, PAC

## 2016-09-30 NOTE — Progress Notes (Signed)
Pt. Expressing her displeasure about having to go for the MRI. She reports she isn't ready to go and find out what is going on with her but everyone keeps trying to make her. She states she has three children and she is not ready to give up but people need to leave her alone because sometimes she doesn't want to talk about what's going on. Informed the pt. That she didn't have to talk about it until she was ready to. Asked her if she wanted to go for the test before I left her room and she said let me think about it. When I went back in she stated she was ready that all she needed was a little time to get her head together.

## 2016-09-30 NOTE — Progress Notes (Signed)
Pt. Off the unit with [facility]

## 2016-09-30 NOTE — Progress Notes (Signed)
[facility] here to pick up the patient.

## 2016-10-01 ENCOUNTER — Ambulatory Visit: Payer: 59 | Admitting: Pulmonary Disease

## 2016-10-01 ENCOUNTER — Encounter: Payer: Self-pay | Admitting: Oncology

## 2016-10-01 ENCOUNTER — Ambulatory Visit
Admit: 2016-10-01 | Discharge: 2016-10-01 | Disposition: A | Discharge status: Home / Self Care | Payer: 59 | Attending: Radiation Oncology | Admitting: Radiation Oncology

## 2016-10-01 ENCOUNTER — Ambulatory Visit: Payer: 59

## 2016-10-01 DIAGNOSIS — C3492 Malignant neoplasm of unspecified part of left bronchus or lung: Secondary | ICD-10-CM

## 2016-10-01 DIAGNOSIS — C7931 Secondary malignant neoplasm of brain: Secondary | ICD-10-CM

## 2016-10-01 MED ORDER — LORAZEPAM 2 MG/ML IJ SOLN
1.0000 mg | Freq: Once | INTRAMUSCULAR | Status: AC
  Administered 2016-10-01: 1 mg via INTRAVENOUS
  Filled 2016-10-01: qty 1

## 2016-10-01 MED ORDER — DEXAMETHASONE 4 MG PO TABS: 8.0000 mg | ORAL_TABLET | Freq: Two times a day (BID) | ORAL | 0 refills | Status: AC

## 2016-10-01 MED ORDER — LEVETIRACETAM 500 MG PO TABS: 500.0000 mg | ORAL_TABLET | Freq: Two times a day (BID) | ORAL | 0 refills | Status: DC

## 2016-10-01 MED ORDER — PREMIER PROTEIN SHAKE: 11.0000 [oz_av] | Freq: Two times a day (BID) | ORAL | 0 refills | Status: AC

## 2016-10-01 NOTE — Discharge Summary (Addendum)
Physician Discharge Summary  MASAYE GATCHALIAN VOZ:[id number] DOB: [birth year] DOA: 09/27/2016  PCP: [name], PA  Admit date: 09/27/2016 Discharge date: 10/01/2016  Admitted From: Home  Disposition: Home   Recommendations for Outpatient Follow-up:  1. Follow up with PCP in 1- weeks 2. Follow with the cancer center and oncology as scheduled 3. Patient has been placed on Decadron 8 mg bid, plan to taper after completing radiation therapy.   Home Health: No  Equipment/Devices: No   Discharge Condition: Stable  CODE STATUS: Full  Diet recommendation: Regular.  Brief/Interim Summary: 61 yo female presented with right upper extremity jerking movements. Patient known to have small cell lung cancer. Worsening symptoms over last few months. No loss of consciousness. On the initial physical examination blood pressure 143/88, HR 85, RR 20, Temp. 97 with oxygen saturation 98%. Lungs clear to auscultation, heart s1 and s2 presents, abdomen soft and non tender, lower extremities with no edema. Head CT with a 15 mm ill-defined brain lesion in the high left frontal lobe with severe surrounding vasogenic edema, most concerning for metastatic disease. MRI 2.32.2, 2.2 cm left frontoparietal mass reflecting a solitary intracranial metastasis, associated with vasogenic edema with trace 2 mm left to right shift. Sodium 137, potassium 3.9, chloride 103, bicarbonate 24, glucose 177, creatinine 0.8, BUN 11.7, white count 10.1, hemoglobin 13.4, hematocrit 40.2, platelets are 279. EKG with normal sinus rhythm. Patient admitted for further treatment and observation.   Patient admitted with a working diagnosis of brain metastasis with associated focal/ partial seizure.  1. Solitary brain metastasis. [id number] cm left frontoparietal, complicated by partial seizure. Patient was admitted to the medical unit, placed on a remote telemetry monitor. Started on high-dose systemic steroids with dexamethasone 4 mg IV every 6  hours, prophylactic antiseizure agent with Keppra. Neurology and oncology were contacted on admission. Patient was evaluated by radiation oncology, patient was deemed to be candidate for stereotactic radiosurgery Will continue Keppra 500 twice daily and follow-up with Dr. Shon Hale in the neurology clinic. Electroencephalography, with mild left hemispheric slowing, likely related to focal abnormality in the region, likely metastatic lesion. Plan to taper steroids once she completes radiotherapy.   2. COPD. Remain stable without exacerbation, patient was placed on bronchodilator therapy.  3. Leukocytosis. Deemed to be a reactive, no signs of systemic infection.  4. Small cell lung cancer. Extensive stage TIIb, N2, M1a, with large left upper lobe lung mass and mediastinal adenopathy, malignant left pleural effusion, July 2017. Status post chemotherapy and prophylactic cranial irradiation.      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   budesonide-formoterol 160-4.5 MCG/ACT inhaler Commonly known as:  SYMBICORT Inhale 2 puffs into the lungs 2 (two) times daily.   dexamethasone 4 MG tablet Commonly known as:  DECADRON Take 2 tablets (8 mg total) by mouth 2 (two) times daily.   emollient cream Commonly known as:  BIAFINE Apply topically 2 (two) times daily.  levETIRAcetam 500 MG tablet Commonly known as:  KEPPRA Take 1 tablet (500 mg total) by mouth 2 (two) times daily.   LORazepam 0.5 MG  tablet Commonly known as:  ATIVAN 1 tablet po 30 minutes prior to radiation or MRI   meclizine 12.5 MG tablet Commonly known as:  ANTIVERT Take 1-2 tablets (12.5-25 mg total) by mouth 3 (three) times daily as needed for dizziness.   prochlorperazine 10 MG tablet Commonly known as:  COMPAZINE TAKE 1 TABLET BY MOUTH EVERY 6 HOURS AS NEEDED FOR NAUSEA AND VOMITING   protein supplement shake Liqd Commonly known as:  PREMIER PROTEIN Take 325 mLs (11 oz total) by mouth 2 (two) times daily between meals.   UNABLE TO FIND Take 10 mLs by mouth daily as needed. Med Name: CBD Oil   Vitamin D3 50000 units Caps Take 1 tablet by mouth once a week.       No Known Allergies  Consultations:  Oncology  Radiation oncology  Neurology, over the phone   Procedures/Studies: Ct Head Wo Contrast  Result Date: 09/27/2016 CLINICAL DATA:  Per patient, states seizure activity after radiation-states last week she was found by son down-states she had bit tongue and went incontinent of urine. History of breast cancer. EXAM: CT HEAD WITHOUT CONTRAST TECHNIQUE: Contiguous axial images were obtained from the base of the skull through the vertex without intravenous contrast. COMPARISON:  MR brain 04/11/2016 FINDINGS: Brain: No evidence of acute infarction, hemorrhage, hydrocephalus or extra-axial collection. 15 mm lesion in the high a left frontal lobe with severe surrounding vasogenic edema. Mild mass effect on the left frontal horn of the lateral ventricle. No other lesions are identified on this noncontrast examination. Vascular: No hyperdense vessel or unexpected calcification. Skull: No osseous abnormality. Sinuses/Orbits: Visualized paranasal sinuses are clear. Visualized mastoid sinuses are clear. Visualized orbits demonstrate no focal abnormality. Other: None IMPRESSION: 1. 15 mm ill-defined brain lesion in the high left frontal lobe with severe surrounding vasogenic edema most concerning for metastatic  disease. Recommend further evaluation with an MRI of the brain without and with intravenous contrast. These results were called by telephone at the time of interpretation on 09/27/2016 at 6:52 pm to Dr. Zenia Resides , who verbally acknowledged these results. Electronically Signed   By: Kathreen Devoid   On: 09/27/2016 18:54   Mr Brain W Wo Contrast  Result Date: 09/30/2016 CLINICAL DATA:  Initial evaluation for intracranial metastatic disease. SRS protocol for radiosurgery planning. EXAM: MRI HEAD WITHOUT AND WITH CONTRAST TECHNIQUE: Multiplanar, multiecho pulse sequences of the brain and surrounding structures were obtained without and with intravenous contrast. CONTRAST:  56mL MULTIHANCE GADOBENATE DIMEGLUMINE 529 MG/ML IV SOLN COMPARISON:  Recent MRI from 09/27/2016. FINDINGS: Brain: Previously identified solitary intracranial metastatic lesion centered at the left frontal parietal convexity again seen. Lesion measures 2.1 x 1.9 x 2.0 cm on today's exam (series 10, image 126). Internal susceptibility artifact compatible with necrosis and/or blood products. No significant interval change in surrounding vasogenic edema. Trace 2 mm left-to-right shift stable. No hydrocephalus or ventricular trapping. No other metastatic lesions or abnormal enhancement identified. Previously noted subtle nodular enhancement at the left parietal lobe is not seen on today's exam. This was most likely artifactual on previous study. No other mass lesion. No evidence for acute infarct. Gray-white matter differentiation otherwise maintained. Minimal cerebral white matter disease again noted. Few scattered foci susceptibility fact again noted as well, stable. Pituitary gland suprasellar region within normal limits. Midline structures intact and normal. Vascular:  Major intracranial vascular flow voids are well maintained. Skull and upper cervical spine: Craniocervical junction within normal limits. Visualized upper cervical spine unremarkable. Bone  marrow signal intensity normal. No scalp soft tissue abnormality. Sinuses/Orbits: Globes and orbital soft tissues within normal limits. Paranasal sinuses are clear. Trace right mastoid effusion. Inner ear structures normal. Other: None. IMPRESSION: 1. 2.1 x 1.9 x 2.0 cm left frontoparietal mass, most consistent with a solitary intracranial metastasis. Stable associated vasogenic edema with trace 2 mm of left-to-right shift. 2. No other intracranial metastases identified. Previously question punctate focus of nodular enhancement at the left parietal lobe is not seen on this exam. This was most likely artifactual on prior study. 3. No other acute intracranial process. Electronically Signed   By: Jeannine Boga M.D.   On: 09/30/2016 04:28   Mr Brain W And Wo Contrast  Result Date: 09/27/2016 CLINICAL DATA:  Initial evaluation for intracranial metastatic disease. EXAM: MRI HEAD WITHOUT AND WITH CONTRAST TECHNIQUE: Multiplanar, multiecho pulse sequences of the brain and surrounding structures were obtained without and with intravenous contrast. CONTRAST:  61mL MULTIHANCE GADOBENATE DIMEGLUMINE 529 MG/ML IV SOLN COMPARISON:  Prior CT from earlier same day. FINDINGS: Brain: Study mildly degraded by motion artifact. Cerebral volume within normal limits for age. Few scattered subcentimeter T2/FLAIR hyperintense foci noted within the periventricular and deep white matter both cerebral hemispheres, nonspecific, but felt to be within normal limits for age. No abnormal foci of restricted diffusion to suggest acute or subacute ischemia. No areas of chronic infarction identified. 2.3 x 2.2 x 2.2 cm heterogeneously enhancing mass centered along the gray-white matter differentiation at the left frontal parietal region (series 12, image 41), most consistent with a probable intracranial metastasis. Extensive vasogenic edema throughout the adjacent left frontal parietal region. Trace 2 mm left-to-right shift. No hydrocephalus  or ventricular trapping. No other definite lesions identified at this time. A punctate focus of apparent enhancement at the posterior left parietal lobe on axial post-contrast sequence noted (series 12, image 32), not seen on corresponding coronal or sagittal sequences. Artifact is favored. No other abnormal enhancement. No other mass lesion. Few small foci of susceptibility artifact noted within the right parietal lobe and left frontotemporal region (series 9, image 52). No associated enhancement. No other evidence for acute or chronic intracranial hemorrhage. Major dural sinuses are grossly patent. Pituitary gland within normal limits. Vascular: Major intracranial vascular flow voids are maintained. Skull and upper cervical spine: Craniocervical junction within normal limits. Visualized upper cervical spine unremarkable. Bone marrow signal intensity within normal limits. No scalp soft tissue abnormality. Sinuses/Orbits: Globes and orbital soft tissues within normal limits. Mild right sphenoid sinus disease noted. Paranasal sinuses are otherwise clear. Small right mastoid effusion noted. Inner ear structures normal. Other: None. IMPRESSION: 1. 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 Associated vasogenic edema with trace 2 mm left-to-right shift. 2. Additional punctate focus of nodular enhancement within the left parietal lobe as above. Artifact is favored, although attention at follow-up is recommended. Electronically Signed   By: Jeannine Boga M.D.   On: 09/27/2016 22:49       Subjective: Patient feeling better, no nausea or vomiting. Positive anxiety.   Discharge Exam: Vitals:   09/30/16 2134 10/01/16 0542  BP: 125/65 (!) 115/57  Pulse: 95 64  Resp: 18 18  Temp: 98 F (36.7 C) 98.5 F (36.9 C)   Vitals:   09/30/16 2016 09/30/16 2134 10/01/16 0542 10/01/16  0849  BP:  125/65 (!) 115/57   Pulse:  95 64   Resp:  18  18   Temp:  98 F (36.7 C) 98.5 F (36.9 C)   TempSrc:  Oral Oral   SpO2: 95% 95% 98% 98%  Weight:      Height:        General: Pt is alert, awake, not in acute distress E ENT: no pallor or icterus, oral mucosa moist.  Cardiovascular: RRR, S1/S2 +, no rubs, no gallops Respiratory: CTA bilaterally, no wheezing, no rhonchi Abdominal: Soft, NT, ND, bowel sounds + Extremities: no edema, no cyanosis    The results of significant diagnostics from this hospitalization (including imaging, microbiology, ancillary and laboratory) are listed below for reference.     Microbiology: No results found for this or any previous visit (from the past 240 hour(s)).   Labs: BNP (last 3 results) No results for input(s): BNP in the last 8760 hours. Basic Metabolic Panel:  Recent Labs Lab 09/27/16 1319 09/27/16 1611 09/27/16 1622 09/28/16 0656 09/29/16 1013 09/30/16 0725  NA 137 137 138 137 139 140  K 3.9 3.8 3.8 4.3 4.0 3.8  CL  --  103 99* 104 105 104  CO2 24 26  --  24 23 26   GLUCOSE 177* 141* 140* 180* 226* 110*  BUN 11.7 12 11 12 13 19   CREATININE 0.8 0.75 0.70 0.70  0.68 0.70 0.61  CALCIUM 10.0 9.6  --  9.6 9.9 9.7  MG  --   --   --   --  1.8  --    Liver Function Tests:  Recent Labs Lab 09/27/16 1319 09/27/16 1611  AST 27 34  ALT 34 36  ALKPHOS 80 79  BILITOT 0.38 0.5  PROT 7.1 7.5  ALBUMIN 3.6 4.1   No results for input(s): LIPASE, AMYLASE in the last 168 hours. No results for input(s): AMMONIA in the last 168 hours. CBC:  Recent Labs Lab 09/27/16 1319 09/27/16 1611 09/27/16 1622 09/28/16 0656 09/29/16 1013 09/30/16 0725  WBC 10.1 10.8*  --  8.5 18.4* 15.1*  NEUTROABS 7.9* 7.7  --   --  17.6* 13.1*  HGB 13.4 13.6 13.9 12.8 13.6 13.2  HCT 40.2 39.4 41.0 37.1 39.7 38.2  MCV 98.3 96.3  --  96.4 96.4 95.7  PLT 279 301  --  304 332 317   Cardiac Enzymes: No results for input(s): CKTOTAL, CKMB, CKMBINDEX, TROPONINI in the last 168 hours. BNP: Invalid  input(s): POCBNP CBG:  Recent Labs Lab 09/27/16 1657  GLUCAP 96   D-Dimer No results for input(s): DDIMER in the last 72 hours. Hgb A1c No results for input(s): HGBA1C in the last 72 hours. Lipid Profile No results for input(s): CHOL, HDL, LDLCALC, TRIG, CHOLHDL, LDLDIRECT in the last 72 hours. Thyroid function studies No results for input(s): TSH, T4TOTAL, T3FREE, THYROIDAB in the last 72 hours.  Invalid input(s): FREET3 Anemia work up No results for input(s): VITAMINB12, FOLATE, FERRITIN, TIBC, IRON, RETICCTPCT in the last 72 hours. Urinalysis No results found for: COLORURINE, APPEARANCEUR, LABSPEC, [place], GLUCOSEU, HGBUR, BILIRUBINUR, KETONESUR, PROTEINUR, UROBILINOGEN, NITRITE, LEUKOCYTESUR Sepsis Labs Invalid input(s): PROCALCITONIN,  WBC,  LACTICIDVEN Microbiology No results found for this or any previous visit (from the past 240 hour(s)).   Time coordinating discharge: 45  minutes  SIGNED:   Tawni Millers, MD  [facility] 10/01/2016, 11:02 AM Pager   If 7PM-7AM, please contact night-coverage [email or web address] Password [email or web address]

## 2016-10-01 NOTE — Progress Notes (Signed)
Date:  October 01, 2016  Chart reviewed for concurrent status and case management needs.  Will continue to follow patient progress.  1. Solitary brain metastasis. No further jerking movements, will change to po Keppra, continue neuro checks per unit protocol. MRI following radiation, 2.1x1.9 x 2.0 mass with stable vasogenic edema, trace 2 mm shift left to right. Will follow recommendations from Oncology.   2. Seizures. Case discussed with neurology on admission with recommendations, for Keppra, no further seziure activity, EEG with no active seizure.   3.COPD. Will continue oxymetry monitoring, bronchodilator therapy.  4. Leukocytosis. Will continue to follow cell count, suspected to be reactive.   Discharge Planning: following for needs  Expected discharge date: [id number]  Velva Harman, BSN, [place], [facility]

## 2016-10-01 NOTE — Progress Notes (Signed)
Date: October 01, 2016 Chart reviewed for discharge orders: None found for case management. Vernia Buff, [phone]

## 2016-10-04 ENCOUNTER — Telehealth: Payer: Self-pay | Admitting: *Deleted

## 2016-10-04 ENCOUNTER — Telehealth: Payer: Self-pay | Admitting: Radiation Oncology

## 2016-10-04 NOTE — Telephone Encounter (Signed)
I spoke with the patient to let her know the genetic counselor would offer her additional testing now that there are more panels to test for. She may be interested but is concerned about associated costs of more testing. We explained the rationale for meeting with neurosurgery and I've also contacted the neurohospitalists' office for follow up in the outpt setting. We are hopeful she can meet with Dr. Shon Hale to discuss her Keppra management and for him to be able to assess her when she can resume driving. Her SRS treatment is scheduled for Thursday at 4pm this week.

## 2016-10-04 NOTE — Telephone Encounter (Signed)
Returned call to pt , per MD pt will need CT CAP and MD follow up. Informed pt to expect a call from scheduling. No further concerns.

## 2016-10-04 NOTE — Telephone Encounter (Signed)
LM for the patient to call me back regarding re-testing with genetic counseling.

## 2016-10-05 ENCOUNTER — Telehealth: Payer: Self-pay | Admitting: Medical Oncology

## 2016-10-05 DIAGNOSIS — Z79899 Other long term (current) drug therapy: Secondary | ICD-10-CM | POA: Diagnosis not present

## 2016-10-05 DIAGNOSIS — Z9221 Personal history of antineoplastic chemotherapy: Secondary | ICD-10-CM | POA: Diagnosis not present

## 2016-10-05 DIAGNOSIS — C3492 Malignant neoplasm of unspecified part of left bronchus or lung: Secondary | ICD-10-CM | POA: Diagnosis not present

## 2016-10-05 DIAGNOSIS — Z510 Encounter for antineoplastic radiation therapy: Secondary | ICD-10-CM | POA: Diagnosis not present

## 2016-10-05 DIAGNOSIS — C7931 Secondary malignant neoplasm of brain: Secondary | ICD-10-CM | POA: Diagnosis not present

## 2016-10-05 NOTE — Telephone Encounter (Signed)
Pt received Sharon [name]'s call and has contrast . No need to call her back.

## 2016-10-06 ENCOUNTER — Telehealth: Payer: Self-pay | Admitting: Internal Medicine

## 2016-10-06 NOTE — Telephone Encounter (Signed)
Per response from MM re 6/11 schedule message he will see patient patient during 6/28 [place] time frame. Due to MM PM PAL cxd for 6/28 patient was added to 12 pm slot. Spoke with patient he is aware.

## 2016-10-07 ENCOUNTER — Ambulatory Visit
Admission: RE | Admit: 2016-10-07 | Discharge: 2016-10-07 | Disposition: A | Discharge status: Home / Self Care | Payer: 59 | Source: Ambulatory Visit | Attending: Radiation Oncology | Admitting: Radiation Oncology

## 2016-10-07 ENCOUNTER — Encounter: Payer: Self-pay | Admitting: Radiation Oncology

## 2016-10-07 VITALS — BP 144/83 | HR 77 | Temp 98.5°F | Resp 18

## 2016-10-07 DIAGNOSIS — C7931 Secondary malignant neoplasm of brain: Secondary | ICD-10-CM

## 2016-10-07 DIAGNOSIS — Z510 Encounter for antineoplastic radiation therapy: Secondary | ICD-10-CM | POA: Diagnosis not present

## 2016-10-07 NOTE — Op Note (Signed)
  Name: Sharon [name]  MRN: [id number]  Date: 10/07/2016   DOB: Aug 29, 1955  Stereotactic Radiosurgery Operative Note  PRE-OPERATIVE DIAGNOSIS:  Solitary Brain Metastasis  POST-OPERATIVE DIAGNOSIS:  Solitary Brain Metastasis  PROCEDURE:  Stereotactic Radiosurgery  SURGEON:  Peggyann Shoals, MD  NARRATIVE: The patient underwent a radiation treatment planning session in the radiation oncology simulation suite under the care of the radiation oncology physician and physicist.  I participated closely in the radiation treatment planning afterwards. The patient underwent planning CT which was fused to 3T high resolution MRI with 1 mm axial slices.  These images were fused on the planning system.  We contoured the gross target volumes and subsequently expanded this to yield the Planning Target Volume. I actively participated in the planning process.  I helped to define and review the target contours and also the contours of the optic pathway, eyes, brainstem and selected nearby organs at risk.  All the dose constraints for critical structures were reviewed and compared to [employer] Task Group 101.  The prescription dose conformity was reviewed.  I approved the plan electronically.    Accordingly, Sharon [name] was brought to the TrueBeam stereotactic radiation treatment linac and placed in the custom immobilization mask.  The patient was aligned according to the IR fiducial markers with BrainLab Exactrac, then orthogonal x-rays were used in ExacTrac with the 6DOF robotic table and the shifts were made to align the patient  Sharon [name] received stereotactic radiosurgery uneventfully.    The detailed description of the procedure is recorded in the radiation oncology procedure note.  I was present for the duration of the procedure.  DISPOSITION:  Following delivery, the patient was transported to nursing in stable condition and monitored for possible acute effects to be discharged to home in stable condition with  follow-up in one month.  Peggyann Shoals, MD 10/07/2016 4:58 PM

## 2016-10-07 NOTE — Progress Notes (Signed)
1705  Arrived to room 10 ambulating with Williemae Natter, Radiation tech and friend.  Follow up appointment card given by Oren Section, Radiation tech.  Nurse monitoring following SRS brain treatment x1 completed.   Vital signs taken BP (!) 144/83   Pulse 77   Temp 98.5 F (36.9 C) (Oral)   Resp 18   SpO2 98%  Vitals taken BP (!) 144/83   Pulse 77   Temp 98.5 F (36.9 C) (Oral)   Resp 18   SpO2 98%    Monitor for 30 minutes, patient knows to call for any unusual symptoms, increased head ache that won't go away, increased fever > 100.5, increased vomiting, vision changes,  1725 Discharged home with friend ambulated to the car.  Patient without complaints. Understands to avoid strenuous activity for the next 24 hours and call [phone] with needs.

## 2016-10-11 ENCOUNTER — Encounter (HOSPITAL_COMMUNITY): Payer: Self-pay

## 2016-10-11 ENCOUNTER — Ambulatory Visit (HOSPITAL_COMMUNITY)
Admission: RE | Admit: 2016-10-11 | Discharge: 2016-10-11 | Disposition: A | Discharge status: Home / Self Care | Payer: 59 | Source: Ambulatory Visit | Attending: Internal Medicine | Admitting: Internal Medicine

## 2016-10-11 DIAGNOSIS — C3492 Malignant neoplasm of unspecified part of left bronchus or lung: Secondary | ICD-10-CM | POA: Insufficient documentation

## 2016-10-11 DIAGNOSIS — I2510 Atherosclerotic heart disease of native coronary artery without angina pectoris: Secondary | ICD-10-CM | POA: Diagnosis not present

## 2016-10-11 DIAGNOSIS — R591 Generalized enlarged lymph nodes: Secondary | ICD-10-CM | POA: Diagnosis not present

## 2016-10-11 MED ORDER — IOPAMIDOL (ISOVUE-300) INJECTION 61%
100.0000 mL | Freq: Once | INTRAVENOUS | Status: AC | PRN
  Administered 2016-10-11: 100 mL via INTRAVENOUS

## 2016-10-11 MED ORDER — IOPAMIDOL (ISOVUE-300) INJECTION 61%
INTRAVENOUS | Status: AC
  Filled 2016-10-11: qty 100

## 2016-10-14 ENCOUNTER — Ambulatory Visit (HOSPITAL_BASED_OUTPATIENT_CLINIC_OR_DEPARTMENT_OTHER): Payer: 59 | Admitting: Internal Medicine

## 2016-10-14 ENCOUNTER — Encounter: Payer: Self-pay | Admitting: *Deleted

## 2016-10-14 ENCOUNTER — Encounter: Payer: Self-pay | Admitting: Internal Medicine

## 2016-10-14 VITALS — BP 154/81 | HR 65 | Temp 98.2°F | Resp 21 | Ht 66.0 in | Wt 244.5 lb

## 2016-10-14 DIAGNOSIS — C3412 Malignant neoplasm of upper lobe, left bronchus or lung: Secondary | ICD-10-CM | POA: Diagnosis not present

## 2016-10-14 DIAGNOSIS — C7931 Secondary malignant neoplasm of brain: Secondary | ICD-10-CM

## 2016-10-14 DIAGNOSIS — Z923 Personal history of irradiation: Secondary | ICD-10-CM | POA: Diagnosis not present

## 2016-10-14 NOTE — Progress Notes (Signed)
[facility] [phone]   [phone]  OFFICE PROGRESS NOTE  [name], PA [place] [address]  DIAGNOSIS: Extensive stage (T2b, N2, M1a) small cell lung cancer presented with large left upper lobe lung mass and mediastinal lymphadenopathy as well as malignant left pleural effusion diagnosed in July 2017.  PRIOR THERAPY:  1) Systemic chemotherapy with cisplatin 60 MG/M2 on day 1 and etoposide at 120 MG/M2 on days 1, 2 and 3 with Neulasta support on day 4. She is status post 6 cycles. 2) prophylactic cranial irradiation under the care of Dr. Sondra Come completed on 05/18/2016. 3) palliative radiotherapy to the enlarging left upper lobe lung nodule under the care of Dr. Sondra Come. 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  CURRENT THERAPY: Systemic chemotherapy with cisplatin 30 MG/M2 and irinotecan 65 MG/M2 on days 1 and 8 every 3 weeks. First dose 10/21/2016.  INTERVAL HISTORY: Sharon [name] 61 y.o. female returns to the clinic today for follow-up visit accompanied by a friend. The patient is feeling much better today. She recently completed stereotactic radiotherapy to a solitary brain metastasis. She is feeling much better. She denied having any chest pain, shortness of breath, cough or hemoptysis. She has no nausea, vomiting, diarrhea or constipation. She denied having any recent weight loss or night sweats. She has no hot current headache or visual changes. She had repeat CT scan of the chest, abdomen and pelvis performed recently and she is here for evaluation and discussion of her scan results and treatment options.  MEDICAL HISTORY: Past Medical History:  Diagnosis Date  . Cancer of right breast ([place]) 2009  . CAP (community acquired pneumonia) 11/06/2015  . COPD (chronic obstructive pulmonary disease) ([place])   . Elevated blood pressure   . Encounter for antineoplastic chemotherapy  01/05/2016  . History of radiation therapy 05/04/16-05/18/16   whole brain 25 Gy in 10 fractions  . History of radiation therapy 09/01/16 - 09/21/16   Left Lung treated to 35 Gy in 14 fractions  . Hoarseness of voice    "for the last 2 months" (11/06/2015)  . Hypertension   . Lung mass dx'd 10/2015  . Menopause   . Personal history of breast cancer 03/29/2016  . Pre-diabetes   . Small cell lung cancer ([place])   . Vitamin D deficiency     ALLERGIES:  has No Known Allergies.  MEDICATIONS:  Current Outpatient Prescriptions  Medication Sig Dispense Refill  . albuterol (PROVENTIL HFA;VENTOLIN HFA) 108 (90 Base) MCG/ACT inhaler Inhale 1 puff into the lungs every 6 (six) hours as needed for wheezing or shortness of breath.    . budesonide-formoterol (SYMBICORT) 160-4.5 MCG/ACT inhaler Inhale 2 puffs into the lungs 2 (two) times daily. 1 Inhaler 11  . Cholecalciferol (VITAMIN D3) 50000 units CAPS Take 1 tablet by mouth once a week.     Marland Kitchen dexamethasone (DECADRON) 4 MG tablet Take 2 tablets (8 mg total) by mouth 2 (two) times daily. 120 tablet 0  . emollient (BIAFINE) cream Apply topically 2 (two) times daily.    . fluticasone (FLONASE) 50 MCG/ACT nasal spray     . levETIRAcetam (KEPPRA) 500 MG tablet Take 1 tablet (500 mg total) by mouth 2 (two) times daily. 60 tablet 0  . LORazepam (ATIVAN) 0.5 MG tablet 1 tablet po 30 minutes prior to radiation or MRI 30 tablet 0  . meclizine (ANTIVERT) 12.5 MG tablet Take 1-2 tablets (12.5-25  mg total) by mouth 3 (three) times daily as needed for dizziness. 20 tablet 0  . montelukast (SINGULAIR) 10 MG tablet   2  . prochlorperazine (COMPAZINE) 10 MG tablet TAKE 1 TABLET BY MOUTH EVERY 6 HOURS AS NEEDED FOR NAUSEA AND VOMITING 360 tablet 1  . protein supplement shake (PREMIER PROTEIN) LIQD Take 325 mLs (11 oz total) by mouth 2 (two) times daily between meals. 19500 mL 0  . UNABLE TO FIND Take 10 mLs by mouth daily as needed. Med Name: CBD Oil     No current  facility-administered medications for this visit.     SURGICAL HISTORY:  Past Surgical History:  Procedure Laterality Date  . BREAST BIOPSY Right 2009  . BREAST LUMPECTOMY Right 2009  . UTERINE FIBROID SURGERY  2000  . VAGINAL HYSTERECTOMY  2000  . VESICOVAGINAL FISTULA CLOSURE W/ TAH  2000  . VIDEO BRONCHOSCOPY WITH ENDOBRONCHIAL ULTRASOUND N/A 11/10/2015   Procedure: VIDEO BRONCHOSCOPY WITH ENDOBRONCHIAL ULTRASOUND;  Surgeon: Javier Glazier, MD;  Location: [facility];  Service: Thoracic;  Laterality: N/A;    REVIEW OF SYSTEMS:  Constitutional: positive for fatigue Eyes: negative Ears, nose, mouth, throat, and face: negative Respiratory: negative Cardiovascular: negative Gastrointestinal: negative Genitourinary:negative Integument/breast: negative Hematologic/lymphatic: negative Musculoskeletal:negative Neurological: negative Behavioral/Psych: negative Endocrine: negative Allergic/Immunologic: negative   PHYSICAL EXAMINATION: General appearance: alert, cooperative, fatigued and no distress Head: Normocephalic, without obvious abnormality, atraumatic Neck: no adenopathy, no JVD, supple, symmetrical, trachea midline and thyroid not enlarged, symmetric, no tenderness/mass/nodules Lymph nodes: Cervical, supraclavicular, and axillary nodes normal. Resp: clear to auscultation bilaterally Back: symmetric, no curvature. ROM normal. No CVA tenderness. Cardio: regular rate and rhythm, S1, S2 normal, no murmur, click, rub or gallop GI: soft, non-tender; bowel sounds normal; no masses,  no organomegaly Extremities: extremities normal, atraumatic, no cyanosis or edema Neurologic: Alert and oriented X 3, normal strength and tone. Normal symmetric reflexes. Normal coordination and gait  ECOG PERFORMANCE STATUS: 1 - Symptomatic but completely ambulatory  Blood pressure (!) 154/81, pulse 65, temperature 98.2 F (36.8 C), temperature source Oral, resp. rate (!) 21, height 5\' 6"  (1.676 m),  weight 244 lb 8 oz (110.9 kg), SpO2 98 %.  LABORATORY DATA: Lab Results  Component Value Date   WBC 15.1 (H) 09/30/2016   HGB 13.2 09/30/2016   HCT 38.2 09/30/2016   MCV 95.7 09/30/2016   PLT 317 09/30/2016      Chemistry      Component Value Date/Time   NA 140 09/30/2016 0725   NA 137 09/27/2016 1319   K 3.8 09/30/2016 0725   K 3.9 09/27/2016 1319   CL 104 09/30/2016 0725   CL 104 07/18/2012 1451   CO2 26 09/30/2016 0725   CO2 24 09/27/2016 1319   BUN 19 09/30/2016 0725   BUN 11.7 09/27/2016 1319   CREATININE 0.61 09/30/2016 0725   CREATININE 0.8 09/27/2016 1319      Component Value Date/Time   CALCIUM 9.7 09/30/2016 0725   CALCIUM 10.0 09/27/2016 1319   ALKPHOS 79 09/27/2016 1611   ALKPHOS 80 09/27/2016 1319   AST 34 09/27/2016 1611   AST 27 09/27/2016 1319   ALT 36 09/27/2016 1611   ALT 34 09/27/2016 1319   BILITOT 0.5 09/27/2016 1611   BILITOT 0.38 09/27/2016 1319       RADIOGRAPHIC STUDIES: Ct Head Wo Contrast  Result Date: 09/27/2016 CLINICAL DATA:  Per patient, states seizure activity after radiation-states last week she was found by son down-states she had  bit tongue and went incontinent of urine. History of breast cancer. EXAM: CT HEAD WITHOUT CONTRAST TECHNIQUE: Contiguous axial images were obtained from the base of the skull through the vertex without intravenous contrast. COMPARISON:  MR brain 04/11/2016 FINDINGS: Brain: No evidence of acute infarction, hemorrhage, hydrocephalus or extra-axial collection. 15 mm lesion in the high a left frontal lobe with severe surrounding vasogenic edema. Mild mass effect on the left frontal horn of the lateral ventricle. No other lesions are identified on this noncontrast examination. Vascular: No hyperdense vessel or unexpected calcification. Skull: No osseous abnormality. Sinuses/Orbits: Visualized paranasal sinuses are clear. Visualized mastoid sinuses are clear. Visualized orbits demonstrate no focal abnormality. Other:  None IMPRESSION: 1. 15 mm ill-defined brain lesion in the high left frontal lobe with severe surrounding vasogenic edema most concerning for metastatic disease. Recommend further evaluation with an MRI of the brain without and with intravenous contrast. These results were called by telephone at the time of interpretation on 09/27/2016 at 6:52 pm to Dr. Zenia Resides , who verbally acknowledged these results. Electronically Signed   By: Kathreen Devoid   On: 09/27/2016 18:54   Ct Chest W Contrast  Result Date: 10/11/2016 CLINICAL DATA:  Recurrent non-small cell lung cancer. EXAM: CT CHEST, ABDOMEN, AND PELVIS WITH CONTRAST TECHNIQUE: Multidetector CT imaging of the chest, abdomen and pelvis was performed following the standard protocol during bolus administration of intravenous contrast. CONTRAST:  181mL ISOVUE-300 IOPAMIDOL (ISOVUE-300) INJECTION 61% COMPARISON:  CT scan 06/28/2016 and PET-CT 11/19/2015 FINDINGS: CT CHEST FINDINGS Cardiovascular: The heart is normal in size. No pericardial effusion. The aorta is normal in caliber. Minimal scattered atherosclerotic calcifications. No dissection. The branch vessels are patent. No obvious coronary artery calcifications. Mediastinum/Nodes: 15 mm pretracheal lymph node on image number 17 was not present on the prior study. There is also an adjacent 10 mm node just posterior to the brachiocephalic vein. Small aorticopulmonary window lymph nodes and subcarinal lymph nodes. There is a necrotic appearing 9 mm hilar lymph node on image number 34. This is new. Lungs/Pleura: Stable emphysematous changes. There are radiation changes involving the left upper lobe. The lung nodule measures 5 mm and previously measured 9 mm. No new pulmonary lesions to suggest pulmonary metastatic disease. No pleural effusion. Chest wall/ Musculoskeletal: No breast masses, chest wall lesion or axillary adenopathy. There is bulky left supraclavicular adenopathy with a nodal mass measuring 4.0 x 3.5 cm on  image 7. There is also by an 11 mm supraclavicular lymph node on the same image on the right side. No significant bony findings. No obvious osseous metastatic disease. CT ABDOMEN PELVIS FINDINGS Hepatobiliary: Diffuse fatty infiltration of the liver. No focal hepatic lesions to suggest metastatic disease. The gallbladder is normal. No common bile duct dilatation. Pancreas: No mass, inflammation or ductal dilatation. Spleen: Normal size.  No focal lesions. Adrenals/Urinary Tract: The adrenal glands and kidneys are unremarkable. There is a small right adrenal gland nodule which is unchanged. It was not hot on the prior PET-CT. The bladder appears normal. Stomach/Bowel: The stomach, duodenum, small bowel and colon are unremarkable. No inflammatory changes, mass lesions or obstructive findings. The terminal ileum is normal. The appendix is normal. Vascular/Lymphatic: The aorta is normal in caliber. The branch vessels are patent. Scattered mesenteric and retroperitoneal lymph nodes but no mass or overt lymphadenopathy. No pelvic lymphadenopathy. Reproductive: The uterus is surgically absent. The right ovary is still present and appears normal. I do not see the left ovary for certain. Other: No pelvic mass  or adenopathy. No free pelvic fluid collections. No inguinal mass or adenopathy. No abdominal wall hernia or subcutaneous lesions. Musculoskeletal: No significant bony findings. IMPRESSION: 1. New bulky left supraclavicular lymphadenopathy and small right supraclavicular adenopathy. 2. New enlarged mediastinal and left hilar lymph nodes. 3. Radiation changes involving the left upper lobe pulmonary nodule which is smaller. No new pulmonary lesions to suggest pulmonary metastatic disease. 4. No findings for metastatic disease involving the abdomen/pelvis. 5. No obvious osseous metastatic disease. Electronically Signed   By: Marijo Sanes M.D.   On: 10/11/2016 16:44   Mr Jeri Cos VW Contrast  Result Date:  09/30/2016 CLINICAL DATA:  Initial evaluation for intracranial metastatic disease. SRS protocol for radiosurgery planning. EXAM: MRI HEAD WITHOUT AND WITH CONTRAST TECHNIQUE: Multiplanar, multiecho pulse sequences of the brain and surrounding structures were obtained without and with intravenous contrast. CONTRAST:  40mL MULTIHANCE GADOBENATE DIMEGLUMINE 529 MG/ML IV SOLN COMPARISON:  Recent MRI from 09/27/2016. FINDINGS: Brain: Previously identified solitary intracranial metastatic lesion centered at the left frontal parietal convexity again seen. Lesion measures 2.1 x 1.9 x 2.0 cm on today's exam (series 10, image 126). Internal susceptibility artifact compatible with necrosis and/or blood products. No significant interval change in surrounding vasogenic edema. Trace 2 mm left-to-right shift stable. No hydrocephalus or ventricular trapping. No other metastatic lesions or abnormal enhancement identified. Previously noted subtle nodular enhancement at the left parietal lobe is not seen on today's exam. This was most likely artifactual on previous study. No other mass lesion. No evidence for acute infarct. Gray-white matter differentiation otherwise maintained. Minimal cerebral white matter disease again noted. Few scattered foci susceptibility fact again noted as well, stable. Pituitary gland suprasellar region within normal limits. Midline structures intact and normal. Vascular: Major intracranial vascular flow voids are well maintained. Skull and upper cervical spine: Craniocervical junction within normal limits. Visualized upper cervical spine unremarkable. Bone marrow signal intensity normal. No scalp soft tissue abnormality. Sinuses/Orbits: Globes and orbital soft tissues within normal limits. Paranasal sinuses are clear. Trace right mastoid effusion. Inner ear structures normal. Other: None. IMPRESSION: 1. 2.1 x 1.9 x 2.0 cm left frontoparietal mass, most consistent with a solitary intracranial metastasis.  Stable associated vasogenic edema with trace 2 mm of left-to-right shift. 2. No other intracranial metastases identified. Previously question punctate focus of nodular enhancement at the left parietal lobe is not seen on this exam. This was most likely artifactual on prior study. 3. No other acute intracranial process. Electronically Signed   By: Jeannine Boga M.D.   On: 09/30/2016 04:28   Mr Brain W And Wo Contrast  Result Date: 09/27/2016 CLINICAL DATA:  Initial evaluation for intracranial metastatic disease. EXAM: MRI HEAD WITHOUT AND WITH CONTRAST TECHNIQUE: Multiplanar, multiecho pulse sequences of the brain and surrounding structures were obtained without and with intravenous contrast. CONTRAST:  3mL MULTIHANCE GADOBENATE DIMEGLUMINE 529 MG/ML IV SOLN COMPARISON:  Prior CT from earlier same day. FINDINGS: Brain: Study mildly degraded by motion artifact. Cerebral volume within normal limits for age. Few scattered subcentimeter T2/FLAIR hyperintense foci noted within the periventricular and deep white matter both cerebral hemispheres, nonspecific, but felt to be within normal limits for age. No abnormal foci of restricted diffusion to suggest acute or subacute ischemia. No areas of chronic infarction identified. 2.3 x 2.2 x 2.2 cm heterogeneously enhancing mass centered along the gray-white matter differentiation at the left frontal parietal region (series 12, image 41), most consistent with a probable intracranial metastasis. Extensive vasogenic edema throughout the adjacent left  frontal parietal region. Trace 2 mm left-to-right shift. No hydrocephalus or ventricular trapping. No other definite lesions identified at this time. A punctate focus of apparent enhancement at the posterior left parietal lobe on axial post-contrast sequence noted (series 12, image 32), not seen on corresponding coronal or sagittal sequences. Artifact is favored. No other abnormal enhancement. No other mass lesion. Few small  foci of susceptibility artifact noted within the right parietal lobe and left frontotemporal region (series 9, image 52). No associated enhancement. No other evidence for acute or chronic intracranial hemorrhage. Major dural sinuses are grossly patent. Pituitary gland within normal limits. Vascular: Major intracranial vascular flow voids are maintained. Skull and upper cervical spine: Craniocervical junction within normal limits. Visualized upper cervical spine unremarkable. Bone marrow signal intensity within normal limits. No scalp soft tissue abnormality. Sinuses/Orbits: Globes and orbital soft tissues within normal limits. Mild right sphenoid sinus disease noted. Paranasal sinuses are otherwise clear. Small right mastoid effusion noted. Inner ear structures normal. Other: None. IMPRESSION: 1. 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 Associated vasogenic edema with trace 2 mm left-to-right shift. 2. Additional punctate focus of nodular enhancement within the left parietal lobe as above. Artifact is favored, although attention at follow-up is recommended. Electronically Signed   By: Jeannine Boga M.D.   On: 09/27/2016 22:49   Ct Abdomen Pelvis W Contrast  Result Date: 10/11/2016 CLINICAL DATA:  Recurrent non-small cell lung cancer. EXAM: CT CHEST, ABDOMEN, AND PELVIS WITH CONTRAST TECHNIQUE: Multidetector CT imaging of the chest, abdomen and pelvis was performed following the standard protocol during bolus administration of intravenous contrast. CONTRAST:  137mL ISOVUE-300 IOPAMIDOL (ISOVUE-300) INJECTION 61% COMPARISON:  CT scan 06/28/2016 and PET-CT 11/19/2015 FINDINGS: CT CHEST FINDINGS Cardiovascular: The heart is normal in size. No pericardial effusion. The aorta is normal in caliber. Minimal scattered atherosclerotic calcifications. No dissection. The branch vessels are patent. No obvious coronary artery calcifications.  Mediastinum/Nodes: 15 mm pretracheal lymph node on image number 17 was not present on the prior study. There is also an adjacent 10 mm node just posterior to the brachiocephalic vein. Small aorticopulmonary window lymph nodes and subcarinal lymph nodes. There is a necrotic appearing 9 mm hilar lymph node on image number 34. This is new. Lungs/Pleura: Stable emphysematous changes. There are radiation changes involving the left upper lobe. The lung nodule measures 5 mm and previously measured 9 mm. No new pulmonary lesions to suggest pulmonary metastatic disease. No pleural effusion. Chest wall/ Musculoskeletal: No breast masses, chest wall lesion or axillary adenopathy. There is bulky left supraclavicular adenopathy with a nodal mass measuring 4.0 x 3.5 cm on image 7. There is also by an 11 mm supraclavicular lymph node on the same image on the right side. No significant bony findings. No obvious osseous metastatic disease. CT ABDOMEN PELVIS FINDINGS Hepatobiliary: Diffuse fatty infiltration of the liver. No focal hepatic lesions to suggest metastatic disease. The gallbladder is normal. No common bile duct dilatation. Pancreas: No mass, inflammation or ductal dilatation. Spleen: Normal size.  No focal lesions. Adrenals/Urinary Tract: The adrenal glands and kidneys are unremarkable. There is a small right adrenal gland nodule which is unchanged. It was not hot on the prior PET-CT. The bladder appears normal. Stomach/Bowel: The stomach, duodenum, small bowel and colon are unremarkable. No inflammatory changes, mass lesions or obstructive findings. The terminal ileum is normal. The appendix is normal. Vascular/Lymphatic: The aorta is normal in caliber. The branch vessels  are patent. Scattered mesenteric and retroperitoneal lymph nodes but no mass or overt lymphadenopathy. No pelvic lymphadenopathy. Reproductive: The uterus is surgically absent. The right ovary is still present and appears normal. I do not see the left  ovary for certain. Other: No pelvic mass or adenopathy. No free pelvic fluid collections. No inguinal mass or adenopathy. No abdominal wall hernia or subcutaneous lesions. Musculoskeletal: No significant bony findings. IMPRESSION: 1. New bulky left supraclavicular lymphadenopathy and small right supraclavicular adenopathy. 2. New enlarged mediastinal and left hilar lymph nodes. 3. Radiation changes involving the left upper lobe pulmonary nodule which is smaller. No new pulmonary lesions to suggest pulmonary metastatic disease. 4. No findings for metastatic disease involving the abdomen/pelvis. 5. No obvious osseous metastatic disease. Electronically Signed   By: Marijo Sanes M.D.   On: 10/11/2016 16:44    ASSESSMENT AND PLAN:  This is a very pleasant 61 years old white female with extensive stage small cell lung cancer status post 6 cycles of systemic chemotherapy with cisplatin and etoposide followed by prophylactic cranial irradiation followed by palliative radiotherapy to a left upper lobe pulmonary nodule followed by stereotactic radiotherapy to solitary brain metastasis. The patient had repeat CT scan of the chest, abdomen and pelvis performed recently. I personally and independently reviewed the scan images and discussed the results with the patient today. Unfortunately her scan showed disease progression with new bulky left supraclavicular lymphadenopathy as well as small right supraclavicular adenopathy and a new enlarged mediastinal and left hilar lymph nodes. I discussed with the patient several options for management of her condition including palliative care and hospice referral versus consideration of palliative systemic chemotherapy versus palliative radiation to the bulky lymphadenopathy and the neck and mediastinum.  The patient is interested in systemic chemotherapy at this point. She will be treated with systemic chemotherapy with cisplatin 30 MG/M2 and irinotecan 65 MG/M2 on days 1 and  8 every 3 weeks. I discussed with the patient adverse effect of her treatment including but not limited to alopecia, myelosuppression, nausea and vomiting, peripheral neuropathy, liver or renal dysfunction as well as increased risk for diarrhea and dehydration. She is expected to start the first cycle of her treatment on 10/21/2016. I will see her back for follow-up visit with the start of cycle #2. She was advised to call immediately if she has any concerning symptoms in the interval. The patient voices understanding of current disease status and treatment options and is in agreement with the current care plan. All questions were answered. The patient knows to call the clinic with any problems, questions or concerns. We can certainly see the patient much sooner if necessary.  Disclaimer: This note was dictated with voice recognition software. Similar sounding words can inadvertently be transcribed and may not be corrected upon review.

## 2016-10-14 NOTE — Progress Notes (Signed)
Sharon [name] after receiving a message that she had an upset stomach.  Left a message asking her to take what she takes normally takes at home to help settle her stomach.  Try ice chips and ginger ale.  There was no mention of elevated temperature.  Asked her to call back if needed before 1800 today.

## 2016-10-14 NOTE — Progress Notes (Signed)
Sharon [name] my telephone messages [name] left a message at 1710 saying she was going to take a laxative because she felt she was constipation.  I tried calling her again to say that was a good plan because you can have an upset stomach due to constipation and I hope that the laxative would be helpful.  Santiago Glad or myself would give her a call tomorrow to see how she is feeling.

## 2016-10-14 NOTE — Progress Notes (Signed)
START OFF PATHWAY REGIMEN - Small Cell Lung   [id number]:Cisplatin + Irinotecan q6 weeks (Off weeks 5, 6):   A cycle is every 6 weeks:     Cisplatin      Irinotecan   **Always confirm dose/schedule in your pharmacy ordering system**    Patient Characteristics: Extensive and Limited Stage, Second Line, Relapse 3 - 6 Months Stage Grouping: Extensive AJCC T Category: T2b AJCC N Category: N2 AJCC M Category: M1a AJCC 8 Stage Grouping: IVA Line of therapy: Second Line Would you be surprised if this patient died  in the next year? I would NOT be surprised if this patient died in the next year Time to Relapse: Relapse 3 - 6 Months Intent of Therapy: Non-Curative / Palliative Intent, Discussed with Patient

## 2016-10-15 ENCOUNTER — Inpatient Hospital Stay (HOSPITAL_BASED_OUTPATIENT_CLINIC_OR_DEPARTMENT_OTHER)
Admission: EM | Admit: 2016-10-15 | Discharge: 2016-10-17 | DRG: 389 | Disposition: A | Discharge status: Home / Self Care | Payer: 59 | Attending: Family Medicine | Admitting: Family Medicine

## 2016-10-15 ENCOUNTER — Encounter (HOSPITAL_BASED_OUTPATIENT_CLINIC_OR_DEPARTMENT_OTHER): Payer: Self-pay | Admitting: *Deleted

## 2016-10-15 ENCOUNTER — Emergency Department (HOSPITAL_BASED_OUTPATIENT_CLINIC_OR_DEPARTMENT_OTHER): Payer: 59

## 2016-10-15 DIAGNOSIS — J449 Chronic obstructive pulmonary disease, unspecified: Secondary | ICD-10-CM | POA: Diagnosis present

## 2016-10-15 DIAGNOSIS — E860 Dehydration: Secondary | ICD-10-CM | POA: Diagnosis present

## 2016-10-15 DIAGNOSIS — K56600 Partial intestinal obstruction, unspecified as to cause: Principal | ICD-10-CM | POA: Diagnosis present

## 2016-10-15 DIAGNOSIS — Z85118 Personal history of other malignant neoplasm of bronchus and lung: Secondary | ICD-10-CM

## 2016-10-15 DIAGNOSIS — Z853 Personal history of malignant neoplasm of breast: Secondary | ICD-10-CM

## 2016-10-15 DIAGNOSIS — C3492 Malignant neoplasm of unspecified part of left bronchus or lung: Secondary | ICD-10-CM | POA: Diagnosis not present

## 2016-10-15 DIAGNOSIS — I10 Essential (primary) hypertension: Secondary | ICD-10-CM | POA: Diagnosis present

## 2016-10-15 DIAGNOSIS — G40909 Epilepsy, unspecified, not intractable, without status epilepticus: Secondary | ICD-10-CM | POA: Diagnosis present

## 2016-10-15 DIAGNOSIS — E871 Hypo-osmolality and hyponatremia: Secondary | ICD-10-CM | POA: Diagnosis present

## 2016-10-15 DIAGNOSIS — C3490 Malignant neoplasm of unspecified part of unspecified bronchus or lung: Secondary | ICD-10-CM | POA: Diagnosis not present

## 2016-10-15 DIAGNOSIS — Z9221 Personal history of antineoplastic chemotherapy: Secondary | ICD-10-CM | POA: Diagnosis not present

## 2016-10-15 DIAGNOSIS — Z923 Personal history of irradiation: Secondary | ICD-10-CM | POA: Diagnosis not present

## 2016-10-15 DIAGNOSIS — Z90710 Acquired absence of both cervix and uterus: Secondary | ICD-10-CM

## 2016-10-15 DIAGNOSIS — Z87891 Personal history of nicotine dependence: Secondary | ICD-10-CM | POA: Diagnosis not present

## 2016-10-15 DIAGNOSIS — K529 Noninfective gastroenteritis and colitis, unspecified: Secondary | ICD-10-CM | POA: Diagnosis present

## 2016-10-15 DIAGNOSIS — E559 Vitamin D deficiency, unspecified: Secondary | ICD-10-CM | POA: Diagnosis present

## 2016-10-15 DIAGNOSIS — C7931 Secondary malignant neoplasm of brain: Secondary | ICD-10-CM | POA: Diagnosis present

## 2016-10-15 DIAGNOSIS — K56609 Unspecified intestinal obstruction, unspecified as to partial versus complete obstruction: Secondary | ICD-10-CM

## 2016-10-15 DIAGNOSIS — Z7951 Long term (current) use of inhaled steroids: Secondary | ICD-10-CM | POA: Diagnosis not present

## 2016-10-15 LAB — CBC WITH DIFFERENTIAL/PLATELET
BASOS ABS: 0 10*3/uL (ref 0.0–0.1)
BASOS PCT: 0 %
EOS ABS: 0 10*3/uL (ref 0.0–0.7)
EOS PCT: 0 %
HCT: 42.3 % (ref 36.0–46.0)
Hemoglobin: 15.6 g/dL — ABNORMAL HIGH (ref 12.0–15.0)
Lymphocytes Relative: 1 %
Lymphs Abs: 0.2 10*3/uL — ABNORMAL LOW (ref 0.7–4.0)
MCH: 34 pg (ref 26.0–34.0)
MCHC: 36.9 g/dL — AB (ref 30.0–36.0)
MCV: 92.2 fL (ref 78.0–100.0)
MONOS PCT: 3 %
Monocytes Absolute: 0.6 10*3/uL (ref 0.1–1.0)
Neutro Abs: 20.6 10*3/uL — ABNORMAL HIGH (ref 1.7–7.7)
Neutrophils Relative %: 96 %
PLATELETS: 247 10*3/uL (ref 150–400)
RBC: 4.59 MIL/uL (ref 3.87–5.11)
RDW: 14.4 % (ref 11.5–15.5)
WBC: 21.4 10*3/uL — ABNORMAL HIGH (ref 4.0–10.5)

## 2016-10-15 LAB — I-STAT CHEM 8, ED
BUN: 23 mg/dL — ABNORMAL HIGH (ref 6–20)
CREATININE: 0.7 mg/dL (ref 0.44–1.00)
Calcium, Ion: 1.09 mmol/L — ABNORMAL LOW (ref 1.15–1.40)
Chloride: 92 mmol/L — ABNORMAL LOW (ref 101–111)
Glucose, Bld: 205 mg/dL — ABNORMAL HIGH (ref 65–99)
HEMATOCRIT: 46 % (ref 36.0–46.0)
HEMOGLOBIN: 15.6 g/dL — AB (ref 12.0–15.0)
POTASSIUM: 4.7 mmol/L (ref 3.5–5.1)
Sodium: 126 mmol/L — ABNORMAL LOW (ref 135–145)
TCO2: 25 mmol/L (ref 0–100)

## 2016-10-15 LAB — LIPASE, BLOOD: LIPASE: 21 U/L (ref 11–51)

## 2016-10-15 MED ORDER — IPRATROPIUM-ALBUTEROL 0.5-2.5 (3) MG/3ML IN SOLN
3.0000 mL | Freq: Four times a day (QID) | RESPIRATORY_TRACT | Status: DC
  Administered 2016-10-15: 3 mL via RESPIRATORY_TRACT
  Filled 2016-10-15 (×2): qty 3

## 2016-10-15 MED ORDER — IPRATROPIUM-ALBUTEROL 20-100 MCG/ACT IN AERS: 1.0000 | INHALATION_SPRAY | Freq: Four times a day (QID) | RESPIRATORY_TRACT | Status: DC

## 2016-10-15 MED ORDER — ALBUTEROL SULFATE HFA 108 (90 BASE) MCG/ACT IN AERS: 1.0000 | INHALATION_SPRAY | Freq: Four times a day (QID) | RESPIRATORY_TRACT | Status: DC | PRN

## 2016-10-15 MED ORDER — ONDANSETRON HCL 4 MG/2ML IJ SOLN
4.0000 mg | Freq: Once | INTRAMUSCULAR | Status: AC
  Administered 2016-10-15: 4 mg via INTRAVENOUS
  Filled 2016-10-15: qty 2

## 2016-10-15 MED ORDER — ONDANSETRON HCL 4 MG/2ML IJ SOLN: 4.0000 mg | Freq: Four times a day (QID) | INTRAMUSCULAR | Status: DC | PRN

## 2016-10-15 MED ORDER — MORPHINE SULFATE (PF) 2 MG/ML IV SOLN: 2.0000 mg | INTRAVENOUS | Status: DC | PRN

## 2016-10-15 MED ORDER — ONDANSETRON HCL 4 MG PO TABS: 4.0000 mg | ORAL_TABLET | Freq: Four times a day (QID) | ORAL | Status: DC | PRN

## 2016-10-15 MED ORDER — SODIUM CHLORIDE 0.9 % IV SOLN: 250.0000 mL | INTRAVENOUS | Status: DC | PRN

## 2016-10-15 MED ORDER — SODIUM CHLORIDE 0.9 % IV BOLUS (SEPSIS)
500.0000 mL | Freq: Once | INTRAVENOUS | Status: AC
  Administered 2016-10-15: 500 mL via INTRAVENOUS

## 2016-10-15 MED ORDER — SODIUM CHLORIDE 0.9 % IV SOLN
INTRAVENOUS | Status: DC
  Administered 2016-10-15 – 2016-10-17 (×5): via INTRAVENOUS

## 2016-10-15 MED ORDER — SODIUM CHLORIDE 0.9% FLUSH: 3.0000 mL | INTRAVENOUS | Status: DC | PRN

## 2016-10-15 MED ORDER — MAGNESIUM CITRATE PO SOLN: 1.0000 | Freq: Once | ORAL | Status: DC

## 2016-10-15 MED ORDER — PREMIER PROTEIN SHAKE
11.0000 [oz_av] | Freq: Two times a day (BID) | ORAL | Status: DC
  Filled 2016-10-15 (×4): qty 325.31

## 2016-10-15 MED ORDER — PROCHLORPERAZINE MALEATE 10 MG PO TABS
10.0000 mg | ORAL_TABLET | Freq: Four times a day (QID) | ORAL | Status: DC | PRN
  Filled 2016-10-15: qty 1

## 2016-10-15 MED ORDER — MONTELUKAST SODIUM 10 MG PO TABS
10.0000 mg | ORAL_TABLET | Freq: Every day | ORAL | Status: DC
  Administered 2016-10-15 – 2016-10-16 (×2): 10 mg via ORAL
  Filled 2016-10-15 (×2): qty 1

## 2016-10-15 MED ORDER — HYDROMORPHONE HCL 1 MG/ML IJ SOLN
1.0000 mg | Freq: Once | INTRAMUSCULAR | Status: DC
  Filled 2016-10-15: qty 1

## 2016-10-15 MED ORDER — MECLIZINE HCL 25 MG PO TABS: 12.5000 mg | ORAL_TABLET | Freq: Three times a day (TID) | ORAL | Status: DC | PRN

## 2016-10-15 MED ORDER — BISACODYL 10 MG RE SUPP
10.0000 mg | Freq: Once | RECTAL | Status: AC
  Administered 2016-10-15: 10 mg via RECTAL
  Filled 2016-10-15: qty 1

## 2016-10-15 MED ORDER — ACETAMINOPHEN 325 MG PO TABS: 650.0000 mg | ORAL_TABLET | Freq: Four times a day (QID) | ORAL | Status: DC | PRN

## 2016-10-15 MED ORDER — IOPAMIDOL (ISOVUE-300) INJECTION 61%
100.0000 mL | Freq: Once | INTRAVENOUS | Status: AC | PRN
  Administered 2016-10-15: 100 mL via INTRAVENOUS

## 2016-10-15 MED ORDER — FLUTICASONE FUROATE-VILANTEROL 200-25 MCG/INH IN AEPB
1.0000 | INHALATION_SPRAY | Freq: Every day | RESPIRATORY_TRACT | Status: DC
  Administered 2016-10-16 – 2016-10-17 (×2): 1 via RESPIRATORY_TRACT
  Filled 2016-10-15: qty 28

## 2016-10-15 MED ORDER — POLYETHYLENE GLYCOL 3350 17 G PO PACK: 17.0000 g | PACK | Freq: Every day | ORAL | Status: DC | PRN

## 2016-10-15 MED ORDER — HEPARIN SODIUM (PORCINE) 5000 UNIT/ML IJ SOLN
5000.0000 [IU] | Freq: Three times a day (TID) | INTRAMUSCULAR | Status: DC
  Administered 2016-10-15 – 2016-10-17 (×5): 5000 [IU] via SUBCUTANEOUS
  Filled 2016-10-15 (×5): qty 1

## 2016-10-15 MED ORDER — MAGNESIUM CITRATE PO SOLN
1.0000 | Freq: Once | ORAL | Status: AC
  Administered 2016-10-15: 1 via ORAL
  Filled 2016-10-15: qty 296

## 2016-10-15 MED ORDER — TRAZODONE HCL 50 MG PO TABS: 50.0000 mg | ORAL_TABLET | Freq: Every evening | ORAL | Status: DC | PRN

## 2016-10-15 MED ORDER — ACETAMINOPHEN 650 MG RE SUPP: 650.0000 mg | Freq: Four times a day (QID) | RECTAL | Status: DC | PRN

## 2016-10-15 MED ORDER — FLUTICASONE PROPIONATE 50 MCG/ACT NA SUSP
1.0000 | Freq: Every day | NASAL | Status: DC
  Administered 2016-10-16: 1 via NASAL
  Filled 2016-10-15: qty 16

## 2016-10-15 MED ORDER — ALBUTEROL SULFATE (2.5 MG/3ML) 0.083% IN NEBU: 2.5000 mg | INHALATION_SOLUTION | RESPIRATORY_TRACT | Status: DC | PRN

## 2016-10-15 MED ORDER — SENNA 8.6 MG PO TABS
1.0000 | ORAL_TABLET | Freq: Two times a day (BID) | ORAL | Status: DC
  Administered 2016-10-15 – 2016-10-17 (×4): 8.6 mg via ORAL
  Filled 2016-10-15 (×4): qty 1

## 2016-10-15 MED ORDER — DEXAMETHASONE 4 MG PO TABS
8.0000 mg | ORAL_TABLET | Freq: Two times a day (BID) | ORAL | Status: DC
  Administered 2016-10-15 – 2016-10-17 (×4): 8 mg via ORAL
  Filled 2016-10-15 (×4): qty 2

## 2016-10-15 MED ORDER — LEVETIRACETAM 500 MG PO TABS
500.0000 mg | ORAL_TABLET | Freq: Two times a day (BID) | ORAL | Status: DC
  Administered 2016-10-15 – 2016-10-17 (×4): 500 mg via ORAL
  Filled 2016-10-15 (×4): qty 1

## 2016-10-15 MED ORDER — SODIUM CHLORIDE 0.9% FLUSH
3.0000 mL | Freq: Two times a day (BID) | INTRAVENOUS | Status: DC
  Administered 2016-10-15: 3 mL via INTRAVENOUS

## 2016-10-15 MED ORDER — EMOLLIENT BASE EX CREA: TOPICAL_CREAM | Freq: Two times a day (BID) | CUTANEOUS | Status: DC

## 2016-10-15 MED ORDER — VITAMIN D3 1.25 MG (50000 UT) PO CAPS: 1.0000 | ORAL_CAPSULE | ORAL | Status: DC

## 2016-10-15 NOTE — Significant Event (Signed)
  PENDING ACCEPTANCE TRANFER NOTE:  Call received from:    [place] REQUESTING TRANSFER:    pSBO  CC: Constipation  HPI: 61 year old with metastatic small cell lung cancer followed by Dr. [name] came into the appointment center complaining about constipation. CT scan was done showed dilated bowel loops, antritis versus partial small bowel obstruction. Patient denies any vomiting. Given magnesium citrate, defer to observation.   PLAN:  According to telephone report, this patient was accepted for transfer to [facility], under [place] team:  [facility]Admit,  I have requested an order be written to call Flow Manager at [phone] upon patient arrival to the floor for final physician assignment who will do the admission and give admitting orders.  SIGNED: Birdie Hopes, MD [facility]  10/15/2016, 12:52 PM

## 2016-10-15 NOTE — H&P (Addendum)
Patient Demographics:    Sharon [name], is a 61 y.o. female  MRN: [id number]   DOB - July 02, 1955  Admit Date - 10/15/2016  Outpatient Primary MD for the patient is [name], [place]   Assessment & Plan:    Active Problems:   Partial small bowel obstruction ([name])   SBO (small bowel obstruction) ([name])    1)Possible SBO- CT abdomen and pelvis finding from 10/15/2016 noted, discussed with on-call general surgeon Dr. Adonis Housekeeper, official surgical consult pending. Suspect some degree of constipation. Treat empirically with oral maxillary trait and Dulcolax suppository. Nothing by mouth except for ice chips, may use NG tube to low intermittent suction afterwards. Patient has leukocytosis however review of records suggest persistent psychosis and patient is currently on steroids. No fevers or any evidence of acute infection at this time .  Patient has nausea but no vomiting. She is not passing any gas. Notably patient had a CT of the abdomen dated 10/11/2016 which was unremarkable from a bowel obstruction standpoint  2) Small cell  Left Lung Cancer- Extensive stage TIIb, N2, M1a, with large left upper lobe lung mass and mediastinal adenopathy, malignant left pleural effusion, (Dxed in July 201). Status post chemotherapy and prophylactic cranial irradiation. Patient follows with Dr. [name]. 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 , Patient had whole brain radiation [facility]) she follows with Dr. [name]  4)COPD- no acute exacerbation at this time, continue bronchodilators  5)HypoNatremia- ??? SIADH In the setting of lung  cancer, BUN/creatinine ratio is more than 20some degree of prerenal dehydration, give NS iv, repeat BMP in a.m.   With History of - Reviewed by me  Past Medical History:  Diagnosis Date  . Cancer of right breast ([place]) 2009  . CAP (community acquired pneumonia) 11/06/2015  . COPD (chronic obstructive pulmonary disease) ([place])   . Elevated blood pressure   . Encounter for antineoplastic chemotherapy 01/05/2016  . History of radiation therapy 05/04/16-05/18/16   whole brain 25 Gy in 10 fractions  . History of radiation therapy 09/01/16 - 09/21/16   Left Lung treated to 35 Gy in 14 fractions  . Hoarseness of voice    "for the last 2 months" (11/06/2015)  . Hypertension   . Lung mass dx'd 10/2015  . Menopause   . Personal history of breast cancer 03/29/2016  . Pre-diabetes   . Small cell lung cancer ([place])   . Vitamin D deficiency       Past Surgical History:  Procedure Laterality Date  . BREAST BIOPSY Right 2009  . BREAST LUMPECTOMY Right 2009  . UTERINE FIBROID SURGERY  2000  . VAGINAL HYSTERECTOMY  2000  . VESICOVAGINAL FISTULA CLOSURE W/ TAH  2000  . VIDEO BRONCHOSCOPY WITH ENDOBRONCHIAL ULTRASOUND  N/A 11/10/2015   Procedure: VIDEO BRONCHOSCOPY WITH ENDOBRONCHIAL ULTRASOUND;  Surgeon: Javier Glazier, MD;  Location: [place];  Service: Thoracic;  Laterality: N/A;      Chief Complaint  Patient presents with  . Constipation  . Abdominal Pain      HPI:    Sharon [name]  is a 61 y.o. female who is a reformed smoker With past medical history relevant for small cell left lung cancer with brain metastases and recent seizure episode who presents from [facility] ED with concerns about abdominal discomfort and lack of bowel movement for at least 3-4 days now. Clinical exam and imaging studies done on 10/15/2016 suggested possible small bowel obstruction. No fever  Or chills . Patient has nausea but no vomiting. She is not passing any gas. Notably patient had a CT of the abdomen  dated 10/11/2016 which was unremarkable from a bowel obstruction standpoint.  Patient was transferred to [facility] for further management, general surgeon on call Dr. Excell Seltzer notified  In ED she received Dilaudid   Review of systems:    In addition to the HPI above,   A full 12 point Review of 10 Systems was done, except as stated above, all other Review of 10 Systems were negative.    Social History:  Reviewed by me    Social History  Substance Use Topics  . Smoking status: Former Smoker    Packs/day: 0.10    Years: 46.00    Types: Cigarettes    Quit date: 11/11/2015  . Smokeless tobacco: Never Used     Comment: Peak rate of 2ppd  . Alcohol use No     Family History :  Reviewed by me    Family History  Problem Relation Age of Onset  . Adopted: Yes  . Cancer Neg Hx      Home Medications:   Prior to Admission medications   Medication Sig Start Date End Date Taking? Authorizing Provider  albuterol (PROVENTIL HFA;VENTOLIN HFA) 108 (90 Base) MCG/ACT inhaler Inhale 1 puff into the lungs every 6 (six) hours as needed for wheezing or shortness of breath.   Yes [provider]  budesonide-formoterol (SYMBICORT) 160-4.5 MCG/ACT inhaler Inhale 2 puffs into the lungs 2 (two) times daily. 08/17/16  Yes Javier Glazier, MD  Cholecalciferol (VITAMIN D3) 50000 units CAPS Take 1 tablet by mouth once a week.  07/21/16 11/18/16 Yes [provider]  dexamethasone (DECADRON) 4 MG tablet Take 2 tablets (8 mg total) by mouth 2 (two) times daily. 10/01/16 10/31/16 Yes Arrien, Jimmy Picket, MD  emollient (BIAFINE) cream Apply topically 2 (two) times daily.   Yes [provider]  fluticasone (FLONASE) 50 MCG/ACT nasal spray Place 1 spray into both nostrils daily.  08/20/16  Yes [provider]  Ipratropium-Albuterol (COMBIVENT RESPIMAT) 20-100 MCG/ACT AERS respimat Inhale 1 puff into the lungs every 6 (six) hours.   Yes [provider]    levETIRAcetam (KEPPRA) 500 MG tablet Take 1 tablet (500 mg total) by mouth 2 (two) times daily. 10/01/16 10/31/16 Yes Arrien, Jimmy Picket, MD  LORazepam (ATIVAN) 0.5 MG tablet 1 tablet po 30 minutes prior to radiation or MRI 09/28/16  Yes Hayden Pedro, PA-C  meclizine (ANTIVERT) 12.5 MG tablet Take 1-2 tablets (12.5-25 mg total) by mouth 3 (three) times daily as needed for dizziness. 06/05/16  Yes Virgel Manifold, MD  montelukast (SINGULAIR) 10 MG tablet Take 10 mg by mouth at bedtime.  09/06/16  Yes  [provider]  prochlorperazine (COMPAZINE) 10 MG tablet TAKE 1 TABLET BY MOUTH EVERY 6 HOURS AS NEEDED FOR NAUSEA AND VOMITING 03/12/16  Yes Curt Bears, MD  protein supplement shake (PREMIER PROTEIN) LIQD Take 325 mLs (11 oz total) by mouth 2 (two) times daily between meals. 10/01/16 10/31/16 Yes Arrien, Jimmy Picket, MD  UNABLE TO FIND Take 10 mLs by mouth daily as needed. Med Name: CBD Oil   Yes [provider]     Allergies:    No Known Allergies   Physical Exam:   Vitals  Blood pressure 127/71, pulse 98, temperature 98.8 F (37.1 C), temperature source Oral, resp. rate 20, height 5\' 6"  (1.676 m), weight 110.8 kg (244 lb 4.3 oz), SpO2 94 %.  Physical Examination: General appearance - alert, obese, and in no distress Mental status - alert, oriented to person, place, and time,  Eyes - sclera anicteric Neck - supple, no JVD elevation , Chest - clear  to auscultation bilaterally, symmetrical air movement,  Heart - S1 and S2 normal,  Abdomen - soft, periumbilical and right upper quadrant tenderness without rebound or guarding, there is increased truncal adiposity, bowel sounds are diminished Neurological - screening mental status exam normal, neck supple without rigidity, cranial nerves II through XII intact, DTR's normal and symmetric Extremities - no pedal edema noted, intact peripheral pulses  Skin - warm, dry    Data Review:    CBC  Recent Labs Lab  10/15/16 1038 10/15/16 1050  WBC 21.4*  --   HGB 15.6* 15.6*  HCT 42.3 46.0  PLT 247  --   MCV 92.2  --   MCH 34.0  --   MCHC 36.9*  --   RDW 14.4  --   LYMPHSABS 0.2*  --   MONOABS 0.6  --   EOSABS 0.0  --   BASOSABS 0.0  --    ------------------------------------------------------------------------------------------------------------------  Chemistries   Recent Labs Lab 10/15/16 1050  NA 126*  K 4.7  CL 92*  GLUCOSE 205*  BUN 23*  CREATININE 0.70   ------------------------------------------------------------------------------------------------------------------ estimated creatinine clearance is 95.5 mL/min (by C-G formula based on SCr of 0.7 mg/dL). ------------------------------------------------------------------------------------------------------------------ No results for input(s): TSH, T4TOTAL, T3FREE, THYROIDAB in the last 72 hours.  Invalid input(s): FREET3   Coagulation profile No results for input(s): INR, PROTIME in the last 168 hours. ------------------------------------------------------------------------------------------------------------------- No results for input(s): DDIMER in the last 72 hours. -------------------------------------------------------------------------------------------------------------------  Cardiac Enzymes No results for input(s): CKMB, TROPONINI, MYOGLOBIN in the last 168 hours.  Invalid input(s): CK ------------------------------------------------------------------------------------------------------------------ No results found for: BNP   ---------------------------------------------------------------------------------------------------------------  Urinalysis No results found for: COLORURINE, APPEARANCEUR, LABSPEC, PHURINE, GLUCOSEU, HGBUR, BILIRUBINUR, KETONESUR, PROTEINUR, UROBILINOGEN, NITRITE,  LEUKOCYTESUR  ----------------------------------------------------------------------------------------------------------------   Imaging Results:    Ct Abdomen Pelvis W Contrast  Result Date: 10/15/2016 CLINICAL DATA:  Abdominal pain, constipation. EXAM: CT ABDOMEN AND PELVIS WITH CONTRAST TECHNIQUE: Multidetector CT imaging of the abdomen and pelvis was performed using the standard protocol following bolus administration of intravenous contrast. CONTRAST:  148mL ISOVUE-300 IOPAMIDOL (ISOVUE-300) INJECTION 61% COMPARISON:  CT scan of October 11, 2016. FINDINGS: Lower chest: No acute abnormality. Hepatobiliary: No gallstones are noted. Fatty infiltration of the liver is noted. Pancreas: Unremarkable. No pancreatic ductal dilatation or surrounding inflammatory changes. Spleen: Normal in size without focal abnormality. Adrenals/Urinary Tract: Adrenal glands are unremarkable. Kidneys are normal, without renal calculi, focal lesion, or hydronephrosis. Bladder is unremarkable. Stomach/Bowel: Sigmoid diverticulosis is noted without inflammation. The appendix appears normal. There are noted  several loops of dilated small bowel in the left upper quadrant as well as in the pelvis. Fecalization of small bowel is noted in the pelvis with mild wall thickening and surrounding inflammatory changes suggesting enteritis. Obstruction secondary to adhesions cannot be excluded. Stool is noted in the colon. Vascular/Lymphatic: Aortic atherosclerosis. No enlarged abdominal or pelvic lymph nodes. Reproductive: Status post hysterectomy. No adnexal masses. Other: Small amount of free fluid is noted in the pelvis as well as adjacent to the liver. Musculoskeletal: No acute or significant osseous findings. IMPRESSION: Fatty infiltration of the liver. Sigmoid diverticulosis without inflammation. Aortic atherosclerosis. Mildly dilated small bowel loops are noted in the left upper quadrant of the abdomen. Also noted are mildly dilated small  bowel loops with fecalization, mild wall thickening and surrounding inflammatory changes in the pelvis. This may be due to enteritis, but partial small bowel obstruction cannot be excluded. Electronically Signed   By: Marijo Conception, M.D.   On: 10/15/2016 11:54    Radiological Exams on Admission: Ct Abdomen Pelvis W Contrast  Result Date: 10/15/2016 CLINICAL DATA:  Abdominal pain, constipation. EXAM: CT ABDOMEN AND PELVIS WITH CONTRAST TECHNIQUE: Multidetector CT imaging of the abdomen and pelvis was performed using the standard protocol following bolus administration of intravenous contrast. CONTRAST:  140mL ISOVUE-300 IOPAMIDOL (ISOVUE-300) INJECTION 61% COMPARISON:  CT scan of October 11, 2016. FINDINGS: Lower chest: No acute abnormality. Hepatobiliary: No gallstones are noted. Fatty infiltration of the liver is noted. Pancreas: Unremarkable. No pancreatic ductal dilatation or surrounding inflammatory changes. Spleen: Normal in size without focal abnormality. Adrenals/Urinary Tract: Adrenal glands are unremarkable. Kidneys are normal, without renal calculi, focal lesion, or hydronephrosis. Bladder is unremarkable. Stomach/Bowel: Sigmoid diverticulosis is noted without inflammation. The appendix appears normal. There are noted several loops of dilated small bowel in the left upper quadrant as well as in the pelvis. Fecalization of small bowel is noted in the pelvis with mild wall thickening and surrounding inflammatory changes suggesting enteritis. Obstruction secondary to adhesions cannot be excluded. Stool is noted in the colon. Vascular/Lymphatic: Aortic atherosclerosis. No enlarged abdominal or pelvic lymph nodes. Reproductive: Status post hysterectomy. No adnexal masses. Other: Small amount of free fluid is noted in the pelvis as well as adjacent to the liver. Musculoskeletal: No acute or significant osseous findings. IMPRESSION: Fatty infiltration of the liver. Sigmoid diverticulosis without  inflammation. Aortic atherosclerosis. Mildly dilated small bowel loops are noted in the left upper quadrant of the abdomen. Also noted are mildly dilated small bowel loops with fecalization, mild wall thickening and surrounding inflammatory changes in the pelvis. This may be due to enteritis, but partial small bowel obstruction cannot be excluded. Electronically Signed   By: Marijo Conception, M.D.   On: 10/15/2016 11:54    DVT Prophylaxis -SCD   AM Labs Ordered, also please review Full Orders  Family Communication: Admission, patients condition and plan of care including tests being ordered have been discussed with the patient who indicate understanding and agree with the plan   Code Status - Full Code  Likely DC to  home  Condition   Stable  At the time of admission, it appears that the appropriate admission status for this patient is INPATIENT. This is judged to be reasonable and necessary in order to provide the required intensity of service to ensure the patient's safety given the presenting symptoms, physical exam findings, and initial radiographic and laboratory data in the context of their  Comorbid conditions.   Patient requires inpatient status due  to high intensity of service, high risk for further deterioration and high frequency of surveillance required.   I certify that at the point of admission it is my clinical judgment that the patient will require inpatient hospital care spanning beyond 2 midnights    [facility], Braxtyn Bojarski M.D on 10/15/2016 at 6:07 PM   Between 7am to 7pm - Pager - [phone]  After 7pm go to [email or web address] - password [email or web address]  [facility] - Office  [phone]  Voice Recognition Viviann Spare dictation system was used to create this note, attempts have been made to correct errors. Please contact the author with questions and/or clarifications.

## 2016-10-15 NOTE — ED Provider Notes (Signed)
[facility] DEPT [facility] Provider Note   CSN: [id number] Arrival date & time: 10/15/16  0903     History   Chief Complaint Chief Complaint  Patient presents with  . Constipation  . Abdominal Pain    HPI Sharon [name] is a 61 y.o. female.  Patient with complaint of abdominal pain for 3 days. Getting worse. Patient felt it was related to constipation. Use 5 Dulcolax without any help yesterday. Associated with some nausea no vomiting. Patient without a history of constipation in the past.  Patient followed by the cancer clinic at [facility] long. Patient has metastatic small cell lung cancer. Patient had CT scans done on Monday did not have any pain then. Of her chest and abdomen. No acute findings in the abdomen. No findings in the chest concerning for recurrent disease. Patient saw oncology yesterday. I decided that they were going to probably start another course of chemotherapy.  Patient recently had radiation treatment to the brain for metastatic disease there.       Past Medical History:  Diagnosis Date  . Cancer of right breast ([place]) 2009  . CAP (community acquired pneumonia) 11/06/2015  . COPD (chronic obstructive pulmonary disease) ([place])   . Elevated blood pressure   . Encounter for antineoplastic chemotherapy 01/05/2016  . History of radiation therapy 05/04/16-05/18/16   whole brain 25 Gy in 10 fractions  . History of radiation therapy 09/01/16 - 09/21/16   Left Lung treated to 35 Gy in 14 fractions  . Hoarseness of voice    "for the last 2 months" (11/06/2015)  . Hypertension   . Lung mass dx'd 10/2015  . Menopause   . Personal history of breast cancer 03/29/2016  . Pre-diabetes   . Small cell lung cancer ([place])   . Vitamin D deficiency     Patient Active Problem List   Diagnosis Date Noted  . Brain tumor ([place])   . Seizure ([place]) 09/28/2016  . Brain metastasis ([place]) 09/27/2016  . Hemoptysis 08/17/2016  . Personal history of breast cancer 03/29/2016  . Encounter for  antineoplastic chemotherapy 01/05/2016  . Nausea without vomiting 12/15/2015  . Primary small cell carcinoma of left lung ([place]) 11/18/2015  . Mediastinal adenopathy   . Breast cancer ([place]) 07/18/2012  . 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 Smoking 08/18/2011    Past Surgical History:  Procedure Laterality Date  . BREAST BIOPSY Right 2009  . BREAST LUMPECTOMY Right 2009  . UTERINE FIBROID SURGERY  2000  . VAGINAL HYSTERECTOMY  2000  . VESICOVAGINAL FISTULA CLOSURE W/ TAH  2000  . VIDEO BRONCHOSCOPY WITH ENDOBRONCHIAL ULTRASOUND N/A 11/10/2015   Procedure: VIDEO BRONCHOSCOPY WITH ENDOBRONCHIAL ULTRASOUND;  Surgeon: Javier Glazier, MD;  Location: [place];  Service: Thoracic;  Laterality: N/A;    OB History    No data available       Home Medications    Prior to Admission medications   Medication Sig Start Date End Date Taking? Authorizing Provider  albuterol (PROVENTIL HFA;VENTOLIN HFA) 108 (90 Base) MCG/ACT inhaler Inhale 1 puff into the lungs every 6 (six) hours as needed for wheezing or shortness of breath.   Yes [provider]  budesonide-formoterol (SYMBICORT) 160-4.5 MCG/ACT inhaler Inhale 2 puffs into the lungs 2 (two) times daily. 08/17/16  Yes Javier Glazier, MD  Cholecalciferol (VITAMIN D3) 50000 units CAPS Take 1 tablet by mouth once a week.  07/21/16 11/18/16 Yes [provider]  dexamethasone (DECADRON) 4 MG tablet Take 2 tablets (8  mg total) by mouth 2 (two) times daily. 10/01/16 10/31/16 Yes Arrien, Jimmy Picket, MD  emollient (BIAFINE) cream Apply topically 2 (two) times daily.   Yes [provider]  fluticasone Asencion Islam) 50 MCG/ACT nasal spray  08/20/16  Yes [provider]  levETIRAcetam (KEPPRA) 500 MG tablet Take 1 tablet (500 mg total) by mouth 2 (two) times daily. 10/01/16 10/31/16 Yes Arrien, Jimmy Picket, MD  LORazepam (ATIVAN) 0.5 MG tablet 1 tablet po 30 minutes prior to radiation or MRI 09/28/16  Yes Hayden Pedro,  PA-C  meclizine (ANTIVERT) 12.5 MG tablet Take 1-2 tablets (12.5-25 mg total) by mouth 3 (three) times daily as needed for dizziness. 06/05/16  Yes Virgel Manifold, MD  montelukast (SINGULAIR) 10 MG tablet  09/06/16  Yes [provider]  prochlorperazine (COMPAZINE) 10 MG tablet TAKE 1 TABLET BY MOUTH EVERY 6 HOURS AS NEEDED FOR NAUSEA AND VOMITING 03/12/16  Yes Curt Bears, MD  protein supplement shake (PREMIER PROTEIN) LIQD Take 325 mLs (11 oz total) by mouth 2 (two) times daily between meals. 10/01/16 10/31/16 Yes Arrien, Jimmy Picket, MD  UNABLE TO FIND Take 10 mLs by mouth daily as needed. Med Name: CBD Oil   Yes [provider]    Family History Family History  Problem Relation Age of Onset  . Adopted: Yes  . Cancer Neg Hx     Social History Social History  Substance Use Topics  . Smoking status: Former Smoker    Packs/day: 0.10    Years: 46.00    Types: Cigarettes    Quit date: 11/11/2015  . Smokeless tobacco: Never Used     Comment: Peak rate of 2ppd  . Alcohol use No     Allergies   Patient has no known allergies.   Review of Systems Review of Systems  Constitutional: Negative for fever.  HENT: Negative for congestion.   Eyes: Negative for visual disturbance.  Respiratory: Negative for shortness of breath.   Cardiovascular: Negative for chest pain.  Gastrointestinal: Positive for abdominal pain, constipation and nausea. Negative for vomiting.  Genitourinary: Negative for dysuria.  Musculoskeletal: Negative for back pain.  Skin: Negative for rash.  Allergic/Immunologic: Positive for immunocompromised state.  Neurological: Negative for headaches.  Hematological: Does not bruise/bleed easily.  Psychiatric/Behavioral: Negative for confusion.     Physical Exam Updated Vital Signs BP 119/77 (BP Location: Left Arm)   Pulse 94   Temp 98.5 F (36.9 C) (Oral)   Resp 20   Ht 1.676 m (5\' 6" )   Wt 110.7 kg (244 lb)   SpO2 98%   BMI 39.38  kg/m   Physical Exam  Constitutional: She is oriented to person, place, and time. She appears well-developed and well-nourished. No distress.  HENT:  Head: Normocephalic and atraumatic.  Mouth/Throat: Oropharynx is clear and moist.  Eyes: Conjunctivae and EOM are normal. Pupils are equal, round, and reactive to light.  Neck: Normal range of motion. Neck supple.  Cardiovascular: Normal rate, regular rhythm and normal heart sounds.   Pulmonary/Chest: Effort normal and breath sounds normal. No respiratory distress.  Abdominal: Soft. Bowel sounds are normal. She exhibits distension. There is no tenderness.  Musculoskeletal: Normal range of motion.  Neurological: She is alert and oriented to person, place, and time. No cranial nerve deficit or sensory deficit. She exhibits normal muscle tone. Coordination normal.  Skin: Skin is warm.  Nursing note and vitals reviewed.    ED Treatments / Results  Labs (all labs ordered are listed, but  only abnormal results are displayed) Labs Reviewed  CBC WITH DIFFERENTIAL/PLATELET - Abnormal; Notable for the following:       Result Value   WBC 21.4 (*)    Hemoglobin 15.6 (*)    MCHC 36.9 (*)    Neutro Abs 20.6 (*)    Lymphs Abs 0.2 (*)    All other components within normal limits  I-STAT CHEM 8, ED - Abnormal; Notable for the following:    Sodium 126 (*)    Chloride 92 (*)    BUN 23 (*)    Glucose, Bld 205 (*)    Calcium, Ion 1.09 (*)    Hemoglobin 15.6 (*)    All other components within normal limits  LIPASE, BLOOD    EKG  EKG Interpretation None       Radiology Ct Abdomen Pelvis W Contrast  Result Date: 10/15/2016 CLINICAL DATA:  Abdominal pain, constipation. EXAM: CT ABDOMEN AND PELVIS WITH CONTRAST TECHNIQUE: Multidetector CT imaging of the abdomen and pelvis was performed using the standard protocol following bolus administration of intravenous contrast. CONTRAST:  142mL ISOVUE-300 IOPAMIDOL (ISOVUE-300) INJECTION 61% COMPARISON:   CT scan of October 11, 2016. FINDINGS: Lower chest: No acute abnormality. Hepatobiliary: No gallstones are noted. Fatty infiltration of the liver is noted. Pancreas: Unremarkable. No pancreatic ductal dilatation or surrounding inflammatory changes. Spleen: Normal in size without focal abnormality. Adrenals/Urinary Tract: Adrenal glands are unremarkable. Kidneys are normal, without renal calculi, focal lesion, or hydronephrosis. Bladder is unremarkable. Stomach/Bowel: Sigmoid diverticulosis is noted without inflammation. The appendix appears normal. There are noted several loops of dilated small bowel in the left upper quadrant as well as in the pelvis. Fecalization of small bowel is noted in the pelvis with mild wall thickening and surrounding inflammatory changes suggesting enteritis. Obstruction secondary to adhesions cannot be excluded. Stool is noted in the colon. Vascular/Lymphatic: Aortic atherosclerosis. No enlarged abdominal or pelvic lymph nodes. Reproductive: Status post hysterectomy. No adnexal masses. Other: Small amount of free fluid is noted in the pelvis as well as adjacent to the liver. Musculoskeletal: No acute or significant osseous findings. IMPRESSION: Fatty infiltration of the liver. Sigmoid diverticulosis without inflammation. Aortic atherosclerosis. Mildly dilated small bowel loops are noted in the left upper quadrant of the abdomen. Also noted are mildly dilated small bowel loops with fecalization, mild wall thickening and surrounding inflammatory changes in the pelvis. This may be due to enteritis, but partial small bowel obstruction cannot be excluded. Electronically Signed   By: Marijo Conception, M.D.   On: 10/15/2016 11:54    Procedures Procedures (including critical care time)  Medications Ordered in ED Medications  0.9 %  sodium chloride infusion ( Intravenous New Bag/Given 10/15/16 1143)  sodium chloride 0.9 % bolus 500 mL (0 mLs Intravenous Stopped 10/15/16 1142)  ondansetron  (ZOFRAN) injection 4 mg (4 mg Intravenous Given 10/15/16 1047)  iopamidol (ISOVUE-300) 61 % injection 100 mL (100 mLs Intravenous Contrast Given 10/15/16 1123)     Initial Impression / Assessment and Plan / ED Course  I have reviewed the triage vital signs and the nursing notes.  Pertinent labs & imaging results that were available during my care of the patient were reviewed by me and considered in my medical decision making (see chart for details).     CT scan raises concerns for partial small bowel obstruction. There is still stool in the large intestines but not noted to be consistent excessive according to radiology. There is also a little bit of fecal  material small bowel. Concerns would be for partial small bowel obstruction patient will be admitted to [facility] long [place] floor. Discussed with hospitalist there.  Patient symptoms just started 3 days ago. Patient does not have a history of constipation. Patient is not on chronic pain meds.  Patient does have a history of metastatic small cell lung cancer. Followed by the cancer clinic at [facility] long. Gets pain medicine as first thing [facility] will ask about 2  Final Clinical Impressions(s) / ED Diagnoses   Final diagnoses:  Partial small bowel obstruction ([place])  Lung cancer metastatic to brain [facility])  Hyponatremia    New Prescriptions New Prescriptions   No medications on file     Fredia Sorrow, MD 10/15/16 1300

## 2016-10-15 NOTE — ED Triage Notes (Signed)
Pt reports abd pain and constipation since last night (last BM this past Tuesday). Reports taking 5 Dulcolax PO last night with no effect. Denies fever, n/v. Reports passing gas. Pt currently has cancer but is not on chemo/radiation yet.

## 2016-10-15 NOTE — ED Notes (Signed)
Patient transported to CT 

## 2016-10-15 NOTE — Consult Note (Signed)
Reason for Consult: Abdominal pain, SBO  Referring Physician: Daena Alper is an 61 y.o. female.   HPI: Patient is a 61 year old female followed by Dr. Inda Merlin for metastatic small cell lung cancer. PRIOR THERAPY:  1) Systemic chemotherapy with cisplatin 60 MG/M2 on day 1 and etoposide at 120 MG/M2 on days 1, 2 and 3 with Neulasta support on day 4. She is status post 6 cycles. 2) prophylactic cranial irradiation under the care of Dr. Sondra Come completed on 05/18/2016. 3) palliative radiotherapy to the enlarging left upper lobe lung nodule under the care of Dr. Sondra Come. 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  She was seen in his office on June 21, yesterday in which time she was feeling well. She had had staging CT of the chest and abdomen with unfortunately progression of her cancer in the chest with bulky supraclavicular lymphadenopathy but no apparent disease seen in the abdomen and no other abdominal abnormalities. She states she had an unusually large breakfast yesterday after this appointment and soon after developed the onset of abdominal pain. She describes sharp abdominal pain which is poorly localized across her lower and mid abdomen. She states the pain is worse with any motion such as walking or jarring in the car and relieved by rest. Denies crampy nature to the pain. She denies any nausea or vomiting. She has not had a bowel movement since onset of the pain. No diarrhea. No melena or hematochezia. No G U symptoms. She presented to the emergency department today with CT scan findings as described below and has been admitted. She states that currently her pain is about half of what it was yesterday. No pain lying at rest. Denies fever or chills.  Past Medical History:  Diagnosis Date  . Cancer of right breast ([place]) 2009  . CAP (community acquired pneumonia) 11/06/2015  . COPD (chronic obstructive pulmonary disease) ([place])   . Elevated blood  pressure   . Encounter for antineoplastic chemotherapy 01/05/2016  . History of radiation therapy 05/04/16-05/18/16   whole brain 25 Gy in 10 fractions  . History of radiation therapy 09/01/16 - 09/21/16   Left Lung treated to 35 Gy in 14 fractions  . Hoarseness of voice    "for the last 2 months" (11/06/2015)  . Hypertension   . Lung mass dx'd 10/2015  . Menopause   . Personal history of breast cancer 03/29/2016  . Pre-diabetes   . Small cell lung cancer ([place])   . Vitamin D deficiency     Past Surgical History:  Procedure Laterality Date  . BREAST BIOPSY Right 2009  . BREAST LUMPECTOMY Right 2009  . UTERINE FIBROID SURGERY  2000  . VAGINAL HYSTERECTOMY  2000  . VESICOVAGINAL FISTULA CLOSURE W/ TAH  2000  . VIDEO BRONCHOSCOPY WITH ENDOBRONCHIAL ULTRASOUND N/A 11/10/2015   Procedure: VIDEO BRONCHOSCOPY WITH ENDOBRONCHIAL ULTRASOUND;  Surgeon: Javier Glazier, MD;  Location: [facility];  Service: Thoracic;  Laterality: N/A;    Family History  Problem Relation Age of Onset  . Adopted: Yes  . Cancer Neg Hx     Social History:  reports that she quit smoking about 11 months ago. Her smoking use included Cigarettes. She has a 4.60 pack-year smoking history. She has never used smokeless tobacco. She reports that she uses drugs, including Marijuana. She reports that she does not drink alcohol.  Allergies: No Known Allergies  Current Facility-Administered Medications  Medication Dose Route Frequency Provider Last Rate Last Dose  .  0.9 %  sodium chloride infusion   Intravenous Continuous Roxan Hockey, MD 125 mL/hr at 10/15/16 1813    . 0.9 %  sodium chloride infusion  250 mL Intravenous PRN Emokpae, Courage, MD      . acetaminophen (TYLENOL) tablet 650 mg  650 mg Oral Q6H PRN Emokpae, Courage, MD       Or  . acetaminophen (TYLENOL) suppository 650 mg  650 mg Rectal Q6H PRN Emokpae, Courage, MD      . albuterol (PROVENTIL) (2.5 MG/3ML) 0.083% nebulizer solution 2.5 mg  2.5 mg Nebulization Q2H  PRN Emokpae, Courage, MD      . bisacodyl (DULCOLAX) suppository 10 mg  10 mg Rectal Once Emokpae, Courage, MD      . dexamethasone (DECADRON) tablet 8 mg  8 mg Oral BID Emokpae, Courage, MD      . Derrill Memo ON 10/16/2016] fluticasone (FLONASE) 50 MCG/ACT nasal spray 1 spray  1 spray Each Nare Daily Emokpae, Courage, MD      . Derrill Memo ON 10/16/2016] fluticasone furoate-vilanterol (BREO ELLIPTA) 200-25 MCG/INH 1 puff  1 puff Inhalation Daily Emokpae, Courage, MD      . heparin injection 5,000 Units  5,000 Units Subcutaneous Q8H Emokpae, Courage, MD      . HYDROmorphone (DILAUDID) injection 1 mg  1 mg Intravenous Once Fredia Sorrow, MD      . ipratropium-albuterol (DUONEB) 0.5-2.5 (3) MG/3ML nebulizer solution 3 mL  3 mL Nebulization Q6H Emokpae, Courage, MD      . levETIRAcetam (KEPPRA) tablet 500 mg  500 mg Oral BID Emokpae, Courage, MD      . magnesium citrate solution 1 Bottle  1 Bottle Oral Once Verlee Monte, MD      . meclizine (ANTIVERT) tablet 12.5-25 mg  12.5-25 mg Oral TID PRN Emokpae, Courage, MD      . montelukast (SINGULAIR) tablet 10 mg  10 mg Oral QHS Emokpae, Courage, MD      . morphine 2 MG/ML injection 2 mg  2 mg Intravenous Q4H PRN Emokpae, Courage, MD      . ondansetron (ZOFRAN) tablet 4 mg  4 mg Oral Q6H PRN Emokpae, Courage, MD       Or  . ondansetron (ZOFRAN) injection 4 mg  4 mg Intravenous Q6H PRN Denton Brick, Courage, MD      . Derrill Memo ON 10/16/2016] protein supplement (PREMIER PROTEIN) liquid  11 oz Oral BID BM Emokpae, Courage, MD      . senna (SENOKOT) tablet 8.6 mg  1 tablet Oral BID Emokpae, Courage, MD      . sodium chloride flush (NS) 0.9 % injection 3 mL  3 mL Intravenous Q12H Emokpae, Courage, MD      . sodium chloride flush (NS) 0.9 % injection 3 mL  3 mL Intravenous PRN Emokpae, Courage, MD      . traZODone (DESYREL) tablet 50 mg  50 mg Oral QHS PRN Emokpae, Courage, MD         Results for orders placed or performed during the hospital encounter of 10/15/16 (from  the past 48 hour(s))  Lipase, blood     Status: None   Collection Time: 10/15/16 10:38 AM  Result Value Ref Range   Lipase 21 11 - 51 U/L    Comment: Performed at [facility], [place] [address], [place], [place] [address]  CBC with Differential/Platelet     Status: Abnormal   Collection Time: 10/15/16 10:38 AM  Result Value Ref Range   WBC 21.4 (  H) 4.0 - 10.5 K/uL   RBC 4.59 3.87 - 5.11 MIL/uL   Hemoglobin 15.6 (H) 12.0 - 15.0 g/dL   HCT 42.3 36.0 - 46.0 %   MCV 92.2 78.0 - 100.0 fL   MCH 34.0 26.0 - 34.0 pg   MCHC 36.9 (H) 30.0 - 36.0 g/dL   RDW 14.4 11.5 - 15.5 %   Platelets 247 150 - 400 K/uL   Neutrophils Relative % 96 %   Neutro Abs 20.6 (H) 1.7 - 7.7 K/uL   Lymphocytes Relative 1 %   Lymphs Abs 0.2 (L) 0.7 - 4.0 K/uL   Monocytes Relative 3 %   Monocytes Absolute 0.6 0.1 - 1.0 K/uL   Eosinophils Relative 0 %   Eosinophils Absolute 0.0 0.0 - 0.7 K/uL   Basophils Relative 0 %   Basophils Absolute 0.0 0.0 - 0.1 K/uL  I-Stat Chem 8, ED     Status: Abnormal   Collection Time: 10/15/16 10:50 AM  Result Value Ref Range   Sodium 126 (L) 135 - 145 mmol/L   Potassium 4.7 3.5 - 5.1 mmol/L   Chloride 92 (L) 101 - 111 mmol/L   BUN 23 (H) 6 - 20 mg/dL   Creatinine, Ser 0.70 0.44 - 1.00 mg/dL   Glucose, Bld 205 (H) 65 - 99 mg/dL   Calcium, Ion 1.09 (L) 1.15 - 1.40 mmol/L   TCO2 25 0 - 100 mmol/L   Hemoglobin 15.6 (H) 12.0 - 15.0 g/dL   HCT 46.0 36.0 - 46.0 %    Ct Abdomen Pelvis W Contrast  Result Date: 10/15/2016 CLINICAL DATA:  Abdominal pain, constipation. EXAM: CT ABDOMEN AND PELVIS WITH CONTRAST TECHNIQUE: Multidetector CT imaging of the abdomen and pelvis was performed using the standard protocol following bolus administration of intravenous contrast. CONTRAST:  171mL ISOVUE-300 IOPAMIDOL (ISOVUE-300) INJECTION 61% COMPARISON:  CT scan of October 11, 2016. FINDINGS: Lower chest: No acute abnormality. Hepatobiliary: No gallstones are noted. Fatty infiltration of the liver is  noted. Pancreas: Unremarkable. No pancreatic ductal dilatation or surrounding inflammatory changes. Spleen: Normal in size without focal abnormality. Adrenals/Urinary Tract: Adrenal glands are unremarkable. Kidneys are normal, without renal calculi, focal lesion, or hydronephrosis. Bladder is unremarkable. Stomach/Bowel: Sigmoid diverticulosis is noted without inflammation. The appendix appears normal. There are noted several loops of dilated small bowel in the left upper quadrant as well as in the pelvis. Fecalization of small bowel is noted in the pelvis with mild wall thickening and surrounding inflammatory changes suggesting enteritis. Obstruction secondary to adhesions cannot be excluded. Stool is noted in the colon. Vascular/Lymphatic: Aortic atherosclerosis. No enlarged abdominal or pelvic lymph nodes. Reproductive: Status post hysterectomy. No adnexal masses. Other: Small amount of free fluid is noted in the pelvis as well as adjacent to the liver. Musculoskeletal: No acute or significant osseous findings. IMPRESSION: Fatty infiltration of the liver. Sigmoid diverticulosis without inflammation. Aortic atherosclerosis. Mildly dilated small bowel loops are noted in the left upper quadrant of the abdomen. Also noted are mildly dilated small bowel loops with fecalization, mild wall thickening and surrounding inflammatory changes in the pelvis. This may be due to enteritis, but partial small bowel obstruction cannot be excluded. Electronically Signed   By: Marijo Conception, M.D.   On: 10/15/2016 11:54    Review of Systems  Constitutional: Negative for chills and fever.  Respiratory: Positive for shortness of breath (With exertion). Negative for cough.   Cardiovascular: Negative for chest pain, palpitations and leg swelling.  Gastrointestinal: Positive for abdominal  pain. Negative for constipation, diarrhea, nausea and vomiting.  Genitourinary: Negative.    Blood pressure 127/71, pulse 98, temperature  98.8 F (37.1 C), temperature source Oral, resp. rate 20, height 5\' 6"  (1.676 m), weight 110.8 kg (244 lb 4.3 oz), SpO2 94 %. Physical Exam General: Alert, pleasant morbidly obese Caucasian female, in no distress Skin: Warm and dry without rash or infection. HEENT: No palpable masses or thyromegaly. Sclera nonicteric. Pupils equal round and reactive.  Lungs: Breath sounds clear and equal without increased work of breathing Cardiovascular: Regular rate and rhythm without murmur. No JVD or edema. Peripheral pulses intact. Abdomen: Obese. Difficult to assess for distention. There is minimal mid and lower abdominal tenderness with no guarding or peritoneal signs. No discernible masses or hernias limited by obesity. Extremities: No edema or joint swelling or deformity. No chronic venous stasis changes. Neurologic: Alert and fully oriented. Affect normal. No gross motor deficits.  Assessment/Plan: Patient with advanced metastatic small cell cancer of the lung with treatment as above who presents with acute abdominal pain.  I have personally reviewed her CT scan. Although this conceivably could be a mechanical bowel obstruction the findings are more consistent with enteritis with thickened bowel loops in the pelvis and some surrounding inflammation with minimal dilatation. No evidence of tumor by CT. Her pain with motion and no nausea or vomiting is more consistent with an enteritis than a small bowel obstruction. She does have an elevated white blood count. Would consider antibiotic coverage for GI organisms. I would hold off on an NG tube now as she has no nausea or significant gastric distention. Continue bowel rest. Repeat abdominal x-rays tomorrow. I think she is unlikely to have a surgical problem. Discussed with the patient and her family.    Landyn Lorincz T 10/15/2016, 6:29 PM

## 2016-10-15 NOTE — ED Notes (Signed)
Discussed pt's plan of care with EDP -- will hold Mag Citrate until after transport to hospital.

## 2016-10-16 ENCOUNTER — Inpatient Hospital Stay (HOSPITAL_COMMUNITY): Payer: 59

## 2016-10-16 DIAGNOSIS — C7931 Secondary malignant neoplasm of brain: Secondary | ICD-10-CM

## 2016-10-16 DIAGNOSIS — C3492 Malignant neoplasm of unspecified part of left bronchus or lung: Secondary | ICD-10-CM

## 2016-10-16 DIAGNOSIS — K56600 Partial intestinal obstruction, unspecified as to cause: Principal | ICD-10-CM

## 2016-10-16 DIAGNOSIS — E871 Hypo-osmolality and hyponatremia: Secondary | ICD-10-CM

## 2016-10-16 DIAGNOSIS — C3490 Malignant neoplasm of unspecified part of unspecified bronchus or lung: Secondary | ICD-10-CM

## 2016-10-16 LAB — BASIC METABOLIC PANEL
ANION GAP: 8 (ref 5–15)
BUN: 14 mg/dL (ref 6–20)
CALCIUM: 8.5 mg/dL — AB (ref 8.9–10.3)
CO2: 26 mmol/L (ref 22–32)
CREATININE: 0.57 mg/dL (ref 0.44–1.00)
Chloride: 98 mmol/L — ABNORMAL LOW (ref 101–111)
GLUCOSE: 150 mg/dL — AB (ref 65–99)
Potassium: 4.5 mmol/L (ref 3.5–5.1)
Sodium: 132 mmol/L — ABNORMAL LOW (ref 135–145)

## 2016-10-16 LAB — CBC
HCT: 34.5 % — ABNORMAL LOW (ref 36.0–46.0)
HEMOGLOBIN: 12.2 g/dL (ref 12.0–15.0)
MCH: 33.2 pg (ref 26.0–34.0)
MCHC: 35.4 g/dL (ref 30.0–36.0)
MCV: 93.8 fL (ref 78.0–100.0)
Platelets: 218 10*3/uL (ref 150–400)
RBC: 3.68 MIL/uL — ABNORMAL LOW (ref 3.87–5.11)
RDW: 14.6 % (ref 11.5–15.5)
WBC: 15.9 10*3/uL — ABNORMAL HIGH (ref 4.0–10.5)

## 2016-10-16 MED ORDER — IPRATROPIUM-ALBUTEROL 0.5-2.5 (3) MG/3ML IN SOLN
3.0000 mL | Freq: Three times a day (TID) | RESPIRATORY_TRACT | Status: DC
  Filled 2016-10-16 (×3): qty 3

## 2016-10-16 MED ORDER — BISACODYL 10 MG RE SUPP
10.0000 mg | Freq: Every day | RECTAL | Status: DC | PRN
Start: 2016-10-16 — End: 2016-10-17
  Administered 2016-10-16: 10 mg via RECTAL
  Filled 2016-10-16: qty 1

## 2016-10-16 NOTE — Progress Notes (Signed)
PROGRESS NOTE  Sharon [name]  FYB:[id number] DOB: 02/28/1956 DOA: 10/15/2016 PCP: [name], PA   Brief Narrative: Sharon [name] is a 61 y.o. 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 Imaging suggested partial SBO vs. enteritis and the patient was transferred to [facility]. Surgery was consulted and recommends conservative measures at this time.  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 enteritis:  - Appreciate surgery recommendations: Continuing bowel rest.  - Repeat abd XR reassuring with contrast in colon, though slightly dilated loops of bowel also noted. Pt improved.  - Bowel management ordered prn - No indication for NGT - Holding antibiotics. Suspect leukocytosis is due to chronic steroids.   Metastatic SCLC: s/p chemotherapy, prophylactic whole brain irradiation. Followed by Dr. Julien Nordmann and Dr. Sondra Come.  - Defer to outpatient providers. Dr. Julien Nordmann added to treatment team as FYI. - Continue decadron and keppra given h/o seizures.   COPD, history of tobacco use: No exacerbation at this time - Continue home medications  Hyponatremia: Relatively acute nature favors dehydration over SIADH from [place], and improved with fluids.  - Mild, continue IVF, monitor.  DVT prophylaxis: Heparin Code Status: Full Family Communication: None at bedside Disposition Plan: Anticipate DC home once symptoms resolved.  Consultants:   General surgery.  Procedures:   None  Antimicrobials:  None   Subjective: Abdominal pain improved, still 5/10 diffusely, worse with palpation. No nausea, emesis. +Small BM. Hungry. No fevers or chills.  Objective: Vitals:   10/15/16 1942 10/15/16 2050 10/16/16 0517 10/16/16 1445  BP:  130/68 119/67 126/83  Pulse:  94 92 80  Resp:  20 18 18    Temp:  98.6 F (37 C) 98.3 F (36.8 C) 98.2 F (36.8 C)  TempSrc:  Oral Oral Oral  SpO2: 93% 94% 93% 95%  Weight:      Height:        Intake/Output Summary (Last 24 hours) at 10/16/16 1647 Last data filed at 10/16/16 1510  Gross per 24 hour  Intake               60 ml  Output                0 ml  Net               60 ml   Filed Weights   10/15/16 0913 10/15/16 1655  Weight: 110.7 kg (244 lb) 110.8 kg (244 lb 4.3 oz)    Examination: General exam: 61 y.o. female in no distress  Respiratory system: Non-labored breathing room air. Clear to auscultation bilaterally.  Cardiovascular system: Regular rate and rhythm. No murmur, rub, or gallop. No JVD, and no pedal edema. Gastrointestinal system: Abdomen soft, mildly diffusely tender without rebound, non-distended, with normoactive bowel sounds. No organomegaly or masses felt. Central nervous system: Alert and oriented. No focal neurological deficits. Extremities: Warm, no deformities Skin: No rashes, lesions no ulcers Psychiatry: Judgement and insight appear normal. Mood & affect appropriate.   Data Reviewed: I have personally reviewed following labs and imaging studies  CBC:  Recent Labs Lab 10/15/16 1038 10/15/16 1050 10/16/16 0424  WBC 21.4*  --  15.9*  NEUTROABS 20.6*  --   --   HGB 15.6* 15.6* 12.2  HCT 42.3 46.0 34.5*  MCV 92.2  --  93.8  PLT 247  --  063   Basic Metabolic Panel:  Recent Labs Lab 10/15/16 1050 10/16/16 0424  NA 126* 132*  K 4.7 4.5  CL 92* 98*  CO2  --  26  GLUCOSE 205* 150*  BUN 23* 14  CREATININE 0.70 0.57  CALCIUM  --  8.5*   GFR: Estimated Creatinine Clearance: 95.5 mL/min (by C-G formula based on SCr of 0.57 mg/dL). Liver Function Tests: No results for input(s): AST, ALT, ALKPHOS, BILITOT, PROT, ALBUMIN in the last 168 hours.  Recent Labs Lab 10/15/16 1038  LIPASE 21   No results for input(s): AMMONIA in the last 168 hours. Coagulation Profile: No results for input(s):  INR, PROTIME in the last 168 hours. Cardiac Enzymes: No results for input(s): CKTOTAL, CKMB, CKMBINDEX, TROPONINI in the last 168 hours. BNP (last 3 results) No results for input(s): PROBNP in the last 8760 hours. HbA1C: No results for input(s): HGBA1C in the last 72 hours. CBG: No results for input(s): GLUCAP in the last 168 hours. Lipid Profile: No results for input(s): CHOL, HDL, LDLCALC, TRIG, CHOLHDL, LDLDIRECT in the last 72 hours. Thyroid Function Tests: No results for input(s): TSH, T4TOTAL, FREET4, T3FREE, THYROIDAB in the last 72 hours. Anemia Panel: No results for input(s): VITAMINB12, FOLATE, FERRITIN, TIBC, IRON, RETICCTPCT in the last 72 hours. Urine analysis: No results found for: COLORURINE, APPEARANCEUR, LABSPEC, PHURINE, GLUCOSEU, HGBUR, BILIRUBINUR, KETONESUR, PROTEINUR, UROBILINOGEN, NITRITE, LEUKOCYTESUR No results found for this or any previous visit (from the past 240 hour(s)).    Radiology Studies: Ct Abdomen Pelvis W Contrast  Result Date: 10/15/2016 CLINICAL DATA:  Abdominal pain, constipation. EXAM: CT ABDOMEN AND PELVIS WITH CONTRAST TECHNIQUE: Multidetector CT imaging of the abdomen and pelvis was performed using the standard protocol following bolus administration of intravenous contrast. CONTRAST:  175mL ISOVUE-300 IOPAMIDOL (ISOVUE-300) INJECTION 61% COMPARISON:  CT scan of October 11, 2016. FINDINGS: Lower chest: No acute abnormality. Hepatobiliary: No gallstones are noted. Fatty infiltration of the liver is noted. Pancreas: Unremarkable. No pancreatic ductal dilatation or surrounding inflammatory changes. Spleen: Normal in size without focal abnormality. Adrenals/Urinary Tract: Adrenal glands are unremarkable. Kidneys are normal, without renal calculi, focal lesion, or hydronephrosis. Bladder is unremarkable. Stomach/Bowel: Sigmoid diverticulosis is noted without inflammation. The appendix appears normal. There are noted several loops of dilated small bowel in the  left upper quadrant as well as in the pelvis. Fecalization of small bowel is noted in the pelvis with mild wall thickening and surrounding inflammatory changes suggesting enteritis. Obstruction secondary to adhesions cannot be excluded. Stool is noted in the colon. Vascular/Lymphatic: Aortic atherosclerosis. No enlarged abdominal or pelvic lymph nodes. Reproductive: Status post hysterectomy. No adnexal masses. Other: Small amount of free fluid is noted in the pelvis as well as adjacent to the liver. Musculoskeletal: No acute or significant osseous findings. IMPRESSION: Fatty infiltration of the liver. Sigmoid diverticulosis without inflammation. Aortic atherosclerosis. Mildly dilated small bowel loops are noted in the left upper quadrant of the abdomen. Also noted are mildly dilated small bowel loops with fecalization, mild wall thickening and surrounding inflammatory changes in the pelvis. This may be due to enteritis, but partial small bowel obstruction cannot be excluded. Electronically Signed   By: Marijo Conception, M.D.   On: 10/15/2016 11:54   Dg Abd Acute W/chest  Result Date: 10/16/2016 CLINICAL DATA:  Small bowel obstruction. EXAM: DG ABDOMEN ACUTE W/ 1V CHEST COMPARISON:  CT scans dated 10/15/2016 and 10/11/2016 and chest x-ray dated 11/10/2015 FINDINGS: There is  slight dilatation of small bowel loops in the right mid abdomen. Contrast from the prior CT scan has passed into the nondistended colon. No free air. New slight atelectasis and small effusion on the right. Scarring in the left upper lobe as described on the recent CT scan of the chest. IMPRESSION: 1. Slightly dilated small bowel loops in the right mid abdomen. This could represent a partial small bowel obstruction. However, contrast has passed into the nondistended colon. 2. Slight atelectasis and effusion at the right base. 3. Scarring in the left upper lobe as previously described on CT scan of 10/11/2016. Electronically Signed   By: Lorriane Shire M.D.   On: 10/16/2016 10:29    Scheduled Meds: . dexamethasone  8 mg Oral BID  . fluticasone  1 spray Each Nare Daily  . fluticasone furoate-vilanterol  1 puff Inhalation Daily  . heparin  5,000 Units Subcutaneous Q8H  .  HYDROmorphone (DILAUDID) injection  1 mg Intravenous Once  . ipratropium-albuterol  3 mL Nebulization TID  . levETIRAcetam  500 mg Oral BID  . magnesium citrate  1 Bottle Oral Once  . montelukast  10 mg Oral QHS  . protein supplement shake  11 oz Oral BID BM  . senna  1 tablet Oral BID  . sodium chloride flush  3 mL Intravenous Q12H   Continuous Infusions: . sodium chloride 125 mL/hr at 10/16/16 1600  . sodium chloride       LOS: 1 day   Time spent: 25 minutes.  Vance Gather, MD [facility] Pager [phone]  If 7PM-7AM, please contact night-coverage [email or web address] Password [facility] 10/16/2016, 4:47 PM

## 2016-10-16 NOTE — Progress Notes (Signed)
Patient ID: Sharon [name], female   DOB: 1955/10/26, 60 y.o.   MRN: [id number]     Subjective: Feels better today. Still some pain across her lower abdomen with movement but improved from yesterday and markedly better than presentation. Denies any nausea or bloating. No longer belching. Had a small bowel movement.  Objective: Vital signs in last 24 hours: Temp:  [98.3 F (36.8 C)-98.8 F (37.1 C)] 98.3 F (36.8 C) (06/23 0517) Pulse Rate:  [91-108] 92 (06/23 0517) Resp:  [16-23] 18 (06/23 0517) BP: (118-136)/(67-90) 119/67 (06/23 0517) SpO2:  [93 %-98 %] 93 % (06/23 0517) Weight:  [110.7 kg (244 lb)-110.8 kg (244 lb 4.3 oz)] 110.8 kg (244 lb 4.3 oz) (06/22 1655) Last BM Date: 10/12/16  Intake/Output from previous day: 06/22 0701 - 06/23 0700 In: 500 [IV Piggyback:500] Out: -  Intake/Output this shift: No intake/output data recorded.  General appearance: alert, cooperative, no distress and morbidly obese GI: Minimal tenderness. No guarding. No palpable mass or appreciable distention exam limited by obesity  Lab Results:   Recent Labs  10/15/16 1038 10/15/16 1050 10/16/16 0424  WBC 21.4*  --  15.9*  HGB 15.6* 15.6* 12.2  HCT 42.3 46.0 34.5*  PLT 247  --  218   BMET  Recent Labs  10/15/16 1050 10/16/16 0424  NA 126* 132*  K 4.7 4.5  CL 92* 98*  CO2  --  26  GLUCOSE 205* 150*  BUN 23* 14  CREATININE 0.70 0.57  CALCIUM  --  8.5*     Studies/Results: Ct Abdomen Pelvis W Contrast  Result Date: 10/15/2016 CLINICAL DATA:  Abdominal pain, constipation. EXAM: CT ABDOMEN AND PELVIS WITH CONTRAST TECHNIQUE: Multidetector CT imaging of the abdomen and pelvis was performed using the standard protocol following bolus administration of intravenous contrast. CONTRAST:  16mL ISOVUE-300 IOPAMIDOL (ISOVUE-300) INJECTION 61% COMPARISON:  CT scan of October 11, 2016. FINDINGS: Lower chest: No acute abnormality. Hepatobiliary: No gallstones are noted. Fatty infiltration of the liver  is noted. Pancreas: Unremarkable. No pancreatic ductal dilatation or surrounding inflammatory changes. Spleen: Normal in size without focal abnormality. Adrenals/Urinary Tract: Adrenal glands are unremarkable. Kidneys are normal, without renal calculi, focal lesion, or hydronephrosis. Bladder is unremarkable. Stomach/Bowel: Sigmoid diverticulosis is noted without inflammation. The appendix appears normal. There are noted several loops of dilated small bowel in the left upper quadrant as well as in the pelvis. Fecalization of small bowel is noted in the pelvis with mild wall thickening and surrounding inflammatory changes suggesting enteritis. Obstruction secondary to adhesions cannot be excluded. Stool is noted in the colon. Vascular/Lymphatic: Aortic atherosclerosis. No enlarged abdominal or pelvic lymph nodes. Reproductive: Status post hysterectomy. No adnexal masses. Other: Small amount of free fluid is noted in the pelvis as well as adjacent to the liver. Musculoskeletal: No acute or significant osseous findings. IMPRESSION: Fatty infiltration of the liver. Sigmoid diverticulosis without inflammation. Aortic atherosclerosis. Mildly dilated small bowel loops are noted in the left upper quadrant of the abdomen. Also noted are mildly dilated small bowel loops with fecalization, mild wall thickening and surrounding inflammatory changes in the pelvis. This may be due to enteritis, but partial small bowel obstruction cannot be excluded. Electronically Signed   By: Marijo Conception, M.D.   On: 10/15/2016 11:54    Anti-infectives: Anti-infectives    None      Assessment/Plan: Patient with metastatic small cell cancer of the lung and acute abdominal pain. CT scan and clinical presentation seems most consistent with enteritis  of uncertain etiology and less likely mechanical partial small bowel obstruction. Symptoms improved, exam minimally tender and white blood count decreased. X-ray today not read but appears  to show a few mildly dilated loops of small bowel in the upper abdomen and contrast in the colon. Etiology not clear but she is steadily improving. Would continue bowel rest today.     LOS: 1 day    Diamante Rubin T 10/16/2016

## 2016-10-17 LAB — BASIC METABOLIC PANEL
Anion gap: 8 (ref 5–15)
BUN: 18 mg/dL (ref 6–20)
CALCIUM: 8.8 mg/dL — AB (ref 8.9–10.3)
CO2: 26 mmol/L (ref 22–32)
CREATININE: 0.51 mg/dL (ref 0.44–1.00)
Chloride: 102 mmol/L (ref 101–111)
GFR calc Af Amer: >60 mL/min (ref >60)
GLUCOSE: 153 mg/dL — AB (ref 65–99)
POTASSIUM: 4.2 mmol/L (ref 3.5–5.1)
SODIUM: 136 mmol/L (ref 135–145)

## 2016-10-17 MED ORDER — SENNA 8.6 MG PO TABS: 1.0000 | ORAL_TABLET | Freq: Every day | ORAL | 0 refills | Status: AC

## 2016-10-17 MED ORDER — IPRATROPIUM-ALBUTEROL 0.5-2.5 (3) MG/3ML IN SOLN: 3.0000 mL | Freq: Four times a day (QID) | RESPIRATORY_TRACT | Status: DC | PRN

## 2016-10-17 NOTE — Discharge Summary (Signed)
Physician Discharge Summary  Sharon [name] QZR:[id number] DOB: 1955/06/12 DOA: 10/15/2016  PCP: [name], PA  Admit date: 10/15/2016 Discharge date: 10/17/2016  Admitted From: Home Disposition: Home   Recommendations for Outpatient Follow-up:  1. Follow up with PCP in 1-2 weeks  Home Health: None Equipment/Devices: None Discharge Condition: Stable CODE STATUS: Full Diet recommendation: Liquid diet > advance to soft tomorrow  Brief/Interim Summary: Sharon [name] is a 61 y.o. 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 Imaging suggested partial SBO vs. enteritis and the patient was transferred to [facility]. Surgery was consulted and recommended conservative measures. Fortunately, the patient improved and has tolerated a liquid diet with continued resolution of pain and no nausea or vomiting. She is discharged in stable condition as advised by general surgery consultant, with return precautions.   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 Abd XR reassuring with contrast in colon, though slightly dilated loops of bowel also noted.  - Appreciate surgery recommendations: Pt tolerated liquid diet and continues to want to go home. She will be discharged with instructions to continue advancing diet slowly.  - Bowel management ordered at discharge prn - Held antibiotics. Suspect leukocytosis is due to chronic steroids.   Metastatic SCLC: s/p chemotherapy, prophylactic whole brain irradiation. Followed by Dr. Julien Nordmann and Dr. Sondra Come.  - Defer to outpatient providers. - Continue decadron and keppra given h/o seizures.   COPD, history of tobacco use: No exacerbation at this  time - Continue home medications  Hyponatremia: Relatively acute nature favors dehydration over SIADH from [place], and improved with fluids.  - Resolved  Discharge Instructions Discharge Instructions    Discharge instructions    Complete by:  As directed    Advance your diet to full liquids today then soft food tomorrow if tolerated well. If you have problems with worsening abdominal pain, nausea or vomiting you will need to seek medical care. Follow up with your PCP and Dr. Julien Nordmann after discharge.     Allergies as of 10/17/2016   No Known Allergies     Medication List    TAKE these medications   albuterol 108 (90 Base) MCG/ACT inhaler Commonly known as:  PROVENTIL HFA;VENTOLIN HFA Inhale 1 puff into the lungs every 6 (six) hours as needed for wheezing or shortness of breath.   budesonide-formoterol 160-4.5 MCG/ACT inhaler Commonly known as:  SYMBICORT Inhale 2 puffs into the lungs 2 (two) times daily.   COMBIVENT RESPIMAT 20-100 MCG/ACT Aers respimat Generic drug:  Ipratropium-Albuterol Inhale 1 puff into the lungs every 6 (six) hours.   dexamethasone 4 MG tablet Commonly known as:  DECADRON Take 2 tablets (8 mg total) by mouth 2 (two) times daily.   emollient cream Commonly known as:  BIAFINE Apply topically 2 (two) times daily.   fluticasone 50 MCG/ACT nasal spray Commonly known as:  FLONASE Place 1 spray into both nostrils daily.   levETIRAcetam 500 MG tablet Commonly known as:  KEPPRA Take 1 tablet (500 mg total) by mouth 2 (two) times daily.   LORazepam 0.5 MG tablet Commonly known as:  ATIVAN 1 tablet po 30 minutes prior to radiation or MRI   meclizine 12.5 MG tablet Commonly known as:  ANTIVERT Take 1-2 tablets (12.5-25 mg total) by mouth 3 (three) times daily as needed for dizziness.   montelukast 10 MG tablet Commonly known as:  SINGULAIR Take 10 mg by mouth at bedtime.   prochlorperazine 10 MG tablet Commonly known as:  COMPAZINE TAKE 1 TABLET  BY MOUTH EVERY 6 HOURS AS NEEDED FOR NAUSEA AND VOMITING   protein supplement shake Liqd Commonly known as:  PREMIER PROTEIN Take 325 mLs (11 oz total) by mouth 2 (two) times daily between meals.   senna 8.6 MG Tabs tablet Commonly known as:  SENOKOT Take 1 tablet (8.6 mg total) by mouth daily.   UNABLE TO FIND Take 10 mLs by mouth daily as needed. Med Name: CBD Oil   Vitamin D3 50000 units Caps Take 1 tablet by mouth once a week.      Follow-up Information    [name], [place]. Schedule an appointment as soon as possible for a visit.   Contact information: [place] [address] [phone]        Gery Pray, MD Follow up.   Specialty:  Radiation Oncology Contact information: [place] [address] [phone]        Curt Bears, MD Follow up.   Specialty:  Oncology Contact information: [place] [address] [phone]          No Known Allergies  Consultations:  General surgery, Dr. Excell Seltzer  Procedures/Studies: Ct Head Wo Contrast  Result Date: 09/27/2016 CLINICAL DATA:  Per patient, states seizure activity after radiation-states last week she was found by son down-states she had bit tongue and went incontinent of urine. History of breast cancer. EXAM: CT HEAD WITHOUT CONTRAST TECHNIQUE: Contiguous axial images were obtained from the base of the skull through the vertex without intravenous contrast. COMPARISON:  MR brain 04/11/2016 FINDINGS: Brain: No evidence of acute infarction, hemorrhage, hydrocephalus or extra-axial collection. 15 mm lesion in the high a left frontal lobe with severe surrounding vasogenic edema. Mild mass effect on the left frontal horn of the lateral ventricle. No other lesions are identified on this noncontrast examination. Vascular: No hyperdense vessel or unexpected calcification. Skull: No osseous abnormality. Sinuses/Orbits: Visualized paranasal  sinuses are clear. Visualized mastoid sinuses are clear. Visualized orbits demonstrate no focal abnormality. Other: None IMPRESSION: 1. 15 mm ill-defined brain lesion in the high left frontal lobe with severe surrounding vasogenic edema most concerning for metastatic disease. Recommend further evaluation with an MRI of the brain without and with intravenous contrast. These results were called by telephone at the time of interpretation on 09/27/2016 at 6:52 pm to Dr. Zenia Resides , who verbally acknowledged these results. Electronically Signed   By: Kathreen Devoid   On: 09/27/2016 18:54   Ct Chest W Contrast  Result Date: 10/11/2016 CLINICAL DATA:  Recurrent non-small cell lung cancer. EXAM: CT CHEST, ABDOMEN, AND PELVIS WITH CONTRAST TECHNIQUE: Multidetector CT imaging of the chest, abdomen and pelvis was performed following the standard protocol during bolus administration of intravenous contrast. CONTRAST:  176mL ISOVUE-300 IOPAMIDOL (ISOVUE-300) INJECTION 61% COMPARISON:  CT scan 06/28/2016 and PET-CT 11/19/2015 FINDINGS: CT CHEST FINDINGS Cardiovascular: The heart is normal in size. No pericardial effusion. The aorta is normal in caliber. Minimal scattered atherosclerotic calcifications. No dissection. The branch vessels are patent. No obvious coronary artery calcifications. Mediastinum/Nodes: 15 mm pretracheal lymph node on image number 17 was not present on the prior study. There is also an adjacent 10 mm node just posterior  to the brachiocephalic vein. Small aorticopulmonary window lymph nodes and subcarinal lymph nodes. There is a necrotic appearing 9 mm hilar lymph node on image number 34. This is new. Lungs/Pleura: Stable emphysematous changes. There are radiation changes involving the left upper lobe. The lung nodule measures 5 mm and previously measured 9 mm. No new pulmonary lesions to suggest pulmonary metastatic disease. No pleural effusion. Chest wall/ Musculoskeletal: No breast masses, chest wall lesion or  axillary adenopathy. There is bulky left supraclavicular adenopathy with a nodal mass measuring 4.0 x 3.5 cm on image 7. There is also by an 11 mm supraclavicular lymph node on the same image on the right side. No significant bony findings. No obvious osseous metastatic disease. CT ABDOMEN PELVIS FINDINGS Hepatobiliary: Diffuse fatty infiltration of the liver. No focal hepatic lesions to suggest metastatic disease. The gallbladder is normal. No common bile duct dilatation. Pancreas: No mass, inflammation or ductal dilatation. Spleen: Normal size.  No focal lesions. Adrenals/Urinary Tract: The adrenal glands and kidneys are unremarkable. There is a small right adrenal gland nodule which is unchanged. It was not hot on the prior PET-CT. The bladder appears normal. Stomach/Bowel: The stomach, duodenum, small bowel and colon are unremarkable. No inflammatory changes, mass lesions or obstructive findings. The terminal ileum is normal. The appendix is normal. Vascular/Lymphatic: The aorta is normal in caliber. The branch vessels are patent. Scattered mesenteric and retroperitoneal lymph nodes but no mass or overt lymphadenopathy. No pelvic lymphadenopathy. Reproductive: The uterus is surgically absent. The right ovary is still present and appears normal. I do not see the left ovary for certain. Other: No pelvic mass or adenopathy. No free pelvic fluid collections. No inguinal mass or adenopathy. No abdominal wall hernia or subcutaneous lesions. Musculoskeletal: No significant bony findings. IMPRESSION: 1. New bulky left supraclavicular lymphadenopathy and small right supraclavicular adenopathy. 2. New enlarged mediastinal and left hilar lymph nodes. 3. Radiation changes involving the left upper lobe pulmonary nodule which is smaller. No new pulmonary lesions to suggest pulmonary metastatic disease. 4. No findings for metastatic disease involving the abdomen/pelvis. 5. No obvious osseous metastatic disease. Electronically  Signed   By: Marijo Sanes M.D.   On: 10/11/2016 16:44   Mr Jeri Cos IR Contrast  Result Date: 09/30/2016 CLINICAL DATA:  Initial evaluation for intracranial metastatic disease. SRS protocol for radiosurgery planning. EXAM: MRI HEAD WITHOUT AND WITH CONTRAST TECHNIQUE: Multiplanar, multiecho pulse sequences of the brain and surrounding structures were obtained without and with intravenous contrast. CONTRAST:  71mL MULTIHANCE GADOBENATE DIMEGLUMINE 529 MG/ML IV SOLN COMPARISON:  Recent MRI from 09/27/2016. FINDINGS: Brain: Previously identified solitary intracranial metastatic lesion centered at the left frontal parietal convexity again seen. Lesion measures 2.1 x 1.9 x 2.0 cm on today's exam (series 10, image 126). Internal susceptibility artifact compatible with necrosis and/or blood products. No significant interval change in surrounding vasogenic edema. Trace 2 mm left-to-right shift stable. No hydrocephalus or ventricular trapping. No other metastatic lesions or abnormal enhancement identified. Previously noted subtle nodular enhancement at the left parietal lobe is not seen on today's exam. This was most likely artifactual on previous study. No other mass lesion. No evidence for acute infarct. Gray-white matter differentiation otherwise maintained. Minimal cerebral white matter disease again noted. Few scattered foci susceptibility fact again noted as well, stable. Pituitary gland suprasellar region within normal limits. Midline structures intact and normal. Vascular: Major intracranial vascular flow voids are well maintained. Skull and upper cervical spine: Craniocervical junction within normal limits. Visualized upper cervical  spine unremarkable. Bone marrow signal intensity normal. No scalp soft tissue abnormality. Sinuses/Orbits: Globes and orbital soft tissues within normal limits. Paranasal sinuses are clear. Trace right mastoid effusion. Inner ear structures normal. Other: None. IMPRESSION: 1. 2.1 x  1.9 x 2.0 cm left frontoparietal mass, most consistent with a solitary intracranial metastasis. Stable associated vasogenic edema with trace 2 mm of left-to-right shift. 2. No other intracranial metastases identified. Previously question punctate focus of nodular enhancement at the left parietal lobe is not seen on this exam. This was most likely artifactual on prior study. 3. No other acute intracranial process. Electronically Signed   By: Jeannine Boga M.D.   On: 09/30/2016 04:28   Mr Brain W And Wo Contrast  Result Date: 09/27/2016 CLINICAL DATA:  Initial evaluation for intracranial metastatic disease. EXAM: MRI HEAD WITHOUT AND WITH CONTRAST TECHNIQUE: Multiplanar, multiecho pulse sequences of the brain and surrounding structures were obtained without and with intravenous contrast. CONTRAST:  71mL MULTIHANCE GADOBENATE DIMEGLUMINE 529 MG/ML IV SOLN COMPARISON:  Prior CT from earlier same day. FINDINGS: Brain: Study mildly degraded by motion artifact. Cerebral volume within normal limits for age. Few scattered subcentimeter T2/FLAIR hyperintense foci noted within the periventricular and deep white matter both cerebral hemispheres, nonspecific, but felt to be within normal limits for age. No abnormal foci of restricted diffusion to suggest acute or subacute ischemia. No areas of chronic infarction identified. 2.3 x 2.2 x 2.2 cm heterogeneously enhancing mass centered along the gray-white matter differentiation at the left frontal parietal region (series 12, image 41), most consistent with a probable intracranial metastasis. Extensive vasogenic edema throughout the adjacent left frontal parietal region. Trace 2 mm left-to-right shift. No hydrocephalus or ventricular trapping. No other definite lesions identified at this time. A punctate focus of apparent enhancement at the posterior left parietal lobe on axial post-contrast sequence noted (series 12, image 32), not seen on corresponding coronal or  sagittal sequences. Artifact is favored. No other abnormal enhancement. No other mass lesion. Few small foci of susceptibility artifact noted within the right parietal lobe and left frontotemporal region (series 9, image 52). No associated enhancement. No other evidence for acute or chronic intracranial hemorrhage. Major dural sinuses are grossly patent. Pituitary gland within normal limits. Vascular: Major intracranial vascular flow voids are maintained. Skull and upper cervical spine: Craniocervical junction within normal limits. Visualized upper cervical spine unremarkable. Bone marrow signal intensity within normal limits. No scalp soft tissue abnormality. Sinuses/Orbits: Globes and orbital soft tissues within normal limits. Mild right sphenoid sinus disease noted. Paranasal sinuses are otherwise clear. Small right mastoid effusion noted. Inner ear structures normal. Other: None. IMPRESSION: 1. 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 Associated vasogenic edema with trace 2 mm left-to-right shift. 2. Additional punctate focus of nodular enhancement within the left parietal lobe as above. Artifact is favored, although attention at follow-up is recommended. Electronically Signed   By: Jeannine Boga M.D.   On: 09/27/2016 22:49   Ct Abdomen Pelvis W Contrast  Result Date: 10/15/2016 CLINICAL DATA:  Abdominal pain, constipation. EXAM: CT ABDOMEN AND PELVIS WITH CONTRAST TECHNIQUE: Multidetector CT imaging of the abdomen and pelvis was performed using the standard protocol following bolus administration of intravenous contrast. CONTRAST:  154mL ISOVUE-300 IOPAMIDOL (ISOVUE-300) INJECTION 61% COMPARISON:  CT scan of October 11, 2016. FINDINGS: Lower chest: No acute abnormality. Hepatobiliary: No gallstones are noted. Fatty infiltration of the liver is noted. Pancreas: Unremarkable. No pancreatic  ductal dilatation or surrounding inflammatory  changes. Spleen: Normal in size without focal abnormality. Adrenals/Urinary Tract: Adrenal glands are unremarkable. Kidneys are normal, without renal calculi, focal lesion, or hydronephrosis. Bladder is unremarkable. Stomach/Bowel: Sigmoid diverticulosis is noted without inflammation. The appendix appears normal. There are noted several loops of dilated small bowel in the left upper quadrant as well as in the pelvis. Fecalization of small bowel is noted in the pelvis with mild wall thickening and surrounding inflammatory changes suggesting enteritis. Obstruction secondary to adhesions cannot be excluded. Stool is noted in the colon. Vascular/Lymphatic: Aortic atherosclerosis. No enlarged abdominal or pelvic lymph nodes. Reproductive: Status post hysterectomy. No adnexal masses. Other: Small amount of free fluid is noted in the pelvis as well as adjacent to the liver. Musculoskeletal: No acute or significant osseous findings. IMPRESSION: Fatty infiltration of the liver. Sigmoid diverticulosis without inflammation. Aortic atherosclerosis. Mildly dilated small bowel loops are noted in the left upper quadrant of the abdomen. Also noted are mildly dilated small bowel loops with fecalization, mild wall thickening and surrounding inflammatory changes in the pelvis. This may be due to enteritis, but partial small bowel obstruction cannot be excluded. Electronically Signed   By: Marijo Conception, M.D.   On: 10/15/2016 11:54   Ct Abdomen Pelvis W Contrast  Result Date: 10/11/2016 CLINICAL DATA:  Recurrent non-small cell lung cancer. EXAM: CT CHEST, ABDOMEN, AND PELVIS WITH CONTRAST TECHNIQUE: Multidetector CT imaging of the chest, abdomen and pelvis was performed following the standard protocol during bolus administration of intravenous contrast. CONTRAST:  163mL ISOVUE-300 IOPAMIDOL (ISOVUE-300) INJECTION 61% COMPARISON:  CT scan 06/28/2016 and PET-CT 11/19/2015 FINDINGS: CT CHEST FINDINGS Cardiovascular: The heart is  normal in size. No pericardial effusion. The aorta is normal in caliber. Minimal scattered atherosclerotic calcifications. No dissection. The branch vessels are patent. No obvious coronary artery calcifications. Mediastinum/Nodes: 15 mm pretracheal lymph node on image number 17 was not present on the prior study. There is also an adjacent 10 mm node just posterior to the brachiocephalic vein. Small aorticopulmonary window lymph nodes and subcarinal lymph nodes. There is a necrotic appearing 9 mm hilar lymph node on image number 34. This is new. Lungs/Pleura: Stable emphysematous changes. There are radiation changes involving the left upper lobe. The lung nodule measures 5 mm and previously measured 9 mm. No new pulmonary lesions to suggest pulmonary metastatic disease. No pleural effusion. Chest wall/ Musculoskeletal: No breast masses, chest wall lesion or axillary adenopathy. There is bulky left supraclavicular adenopathy with a nodal mass measuring 4.0 x 3.5 cm on image 7. There is also by an 11 mm supraclavicular lymph node on the same image on the right side. No significant bony findings. No obvious osseous metastatic disease. CT ABDOMEN PELVIS FINDINGS Hepatobiliary: Diffuse fatty infiltration of the liver. No focal hepatic lesions to suggest metastatic disease. The gallbladder is normal. No common bile duct dilatation. Pancreas: No mass, inflammation or ductal dilatation. Spleen: Normal size.  No focal lesions. Adrenals/Urinary Tract: The adrenal glands and kidneys are unremarkable. There is a small right adrenal gland nodule which is unchanged. It was not hot on the prior PET-CT. The bladder appears normal. Stomach/Bowel: The stomach, duodenum, small bowel and colon are unremarkable. No inflammatory changes, mass lesions or obstructive findings. The terminal ileum is normal. The appendix is normal. Vascular/Lymphatic: The aorta is normal in caliber. The branch vessels are patent. Scattered mesenteric and  retroperitoneal lymph nodes but no mass or overt lymphadenopathy. No pelvic lymphadenopathy. Reproductive: The uterus is surgically  absent. The right ovary is still present and appears normal. I do not see the left ovary for certain. Other: No pelvic mass or adenopathy. No free pelvic fluid collections. No inguinal mass or adenopathy. No abdominal wall hernia or subcutaneous lesions. Musculoskeletal: No significant bony findings. IMPRESSION: 1. New bulky left supraclavicular lymphadenopathy and small right supraclavicular adenopathy. 2. New enlarged mediastinal and left hilar lymph nodes. 3. Radiation changes involving the left upper lobe pulmonary nodule which is smaller. No new pulmonary lesions to suggest pulmonary metastatic disease. 4. No findings for metastatic disease involving the abdomen/pelvis. 5. No obvious osseous metastatic disease. Electronically Signed   By: Marijo Sanes M.D.   On: 10/11/2016 16:44   Dg Abd Acute W/chest  Result Date: 10/16/2016 CLINICAL DATA:  Small bowel obstruction. EXAM: DG ABDOMEN ACUTE W/ 1V CHEST COMPARISON:  CT scans dated 10/15/2016 and 10/11/2016 and chest x-ray dated 11/10/2015 FINDINGS: There is slight dilatation of small bowel loops in the right mid abdomen. Contrast from the prior CT scan has passed into the nondistended colon. No free air. New slight atelectasis and small effusion on the right. Scarring in the left upper lobe as described on the recent CT scan of the chest. IMPRESSION: 1. Slightly dilated small bowel loops in the right mid abdomen. This could represent a partial small bowel obstruction. However, contrast has passed into the nondistended colon. 2. Slight atelectasis and effusion at the right base. 3. Scarring in the left upper lobe as previously described on CT scan of 10/11/2016. Electronically Signed   By: Lorriane Shire M.D.   On: 10/16/2016 10:29   Subjective: Abdominal pain resolved, pt wants to go home. Ate 100% of clear liquid diet  breakfast this AM, had small BM. No N/V.   Discharge Exam: BP 117/64 (BP Location: Left Arm)   Pulse 76   Temp 98.6 F (37 C) (Oral)   Resp 16   Ht 5\' 6"  (1.676 m)   Wt 110.8 kg (244 lb 4.3 oz)   SpO2 94%   BMI 39.43 kg/m   General: Pt is alert, awake, not in acute distress Cardiovascular: RRR, S1/S2 +, no rubs, no gallops Respiratory: CTA bilaterally, no wheezing, no rhonchi Abdominal: Soft, +BS. Minimal tenderness to deep palpation across lower quadrants without rebound, guarding or distention Extremities: No edema, no cyanosis  Labs: Basic Metabolic Panel:  Recent Labs Lab 10/15/16 1050 10/16/16 0424 10/17/16 0412  NA 126* 132* 136  K 4.7 4.5 4.2  CL 92* 98* 102  CO2  --  26 26  GLUCOSE 205* 150* 153*  BUN 23* 14 18  CREATININE 0.70 0.57 0.51  CALCIUM  --  8.5* 8.8*    Recent Labs Lab 10/15/16 1038  LIPASE 21   CBC:  Recent Labs Lab 10/15/16 1038 10/15/16 1050 10/16/16 0424  WBC 21.4*  --  15.9*  NEUTROABS 20.6*  --   --   HGB 15.6* 15.6* 12.2  HCT 42.3 46.0 34.5*  MCV 92.2  --  93.8  PLT 247  --  218    Time coordinating discharge: Approximately 40 minutes  Vance Gather, MD  [facility] 10/17/2016, 11:17 AM Pager [phone]

## 2016-10-17 NOTE — Progress Notes (Signed)
Patient discharged to home with family, discharge instructions reviewed with patient who verbalized understanding. New RX sent to pharmacy. 

## 2016-10-17 NOTE — Discharge Instructions (Signed)
Ileus  Ileus is a condition in which the intestines, also called the bowels, stop working and moving correctly. If the intestines stop working, food cannot pass through to get digested. The intestines are hollow organs that digest food after the food leaves the stomach. These organs are long, muscular tubes that connect the stomach to the rectum. When ileus occurs, the muscular contractions that cause food to move through the intestines stop happening as they normally would.  Ileus can occur for various reasons. This condition is a serious problem that usually requires hospitalization. It can cause symptoms such as nausea, abdominal pain, and bloating. Ileus can last from a few hours to a few days. If the intestines stop working because of a blockage, that is a different condition that is called a bowel obstruction.  What are the causes?  This condition may be caused by:  · Surgery on the abdomen.  · An infection or inflammation in the abdomen. This includes inflammation of the lining of the abdomen (peritonitis).  · Infection or inflammation in other parts of the body, such as pneumonia or pancreatitis.  · Passage of gallstones or kidney stones.  · Damage to the nerves or blood vessels that go to the intestines.  · A collection of blood within the abdominal cavity.  · Imbalance in the salts in the blood (electrolytes).  · Injury to the brain or spinal cord.  · Medicines. Many medicines, including strong pain medicines, can cause ileus or make it worse.    What are the signs or symptoms?  Symptoms of this condition include:  · Bloating of the abdomen.  · Pain or discomfort in the abdomen.  · Poor appetite.  · Nausea and vomiting.  · Lack of normal bowel sounds, such as “growling" in the stomach.    How is this diagnosed?  This condition may be diagnosed with:  · A physical exam and medical history.  · X-rays or a CT scan of the abdomen.    You may also have other tests to help find the cause of the condition.  How  is this treated?  Treatment for this condition may include:  · Resting the intestines until they start to work again. This is often done by:  ? Stopping oral intake of food and drink. You will be given fluid through an IV tube to prevent dehydration.  ? Placing a small tube (nasogastric tube or NG tube) that is passed through your nose and into your stomach. The tube is attached to a suction device and keeps the stomach emptied out. This allows the bowels to rest and also helps to reduce nausea and vomiting.  · Correcting any electrolyte imbalance by giving supplements in the IV fluid.  · Stopping any medicines that might make ileus worse.  · Treating any condition that may have caused ileus.    Follow these instructions at home:  · Follow instructions from your health care provider about diet and fluid intake. Usually, you will be told to:  ? Drink plenty of clear fluids.  ? Avoid alcohol.  ? Avoid caffeine.  ? Eat a bland diet.  · Get plenty of rest. Return to your normal activities as told by your health care provider.  · Take over-the-counter and prescription medicines only as told by your health care provider.  · Keep all follow-up visits as told by your health care provider. This is important.  Contact a health care provider if:  · You have nausea, vomiting,   or abdominal discomfort.  · You have a fever.  Get help right away if:  · You have severe abdominal pain or bloating.  · You cannot eat or drink without vomiting.  This information is not intended to replace advice given to you by your health care provider. Make sure you discuss any questions you have with your health care provider.  Document Released: 04/15/2003 Document Revised: 09/18/2015 Document Reviewed: 06/06/2014  Elsevier Interactive Patient Education © 2018 [employer]

## 2016-10-17 NOTE — Progress Notes (Signed)
Patient ID: Sharon [name], female   DOB: Jul 07, 1955, 61 y.o.   MRN: [id number]     Subjective: She continues to feel better daily. Only mild pain now only when she is up and walking.  No nausea or vomiting. Had another small bowel movement. He is hungry and wants something to eat. Mainly frustrated being in the hospital and wants to go home.  Objective: Vital signs in last 24 hours: Temp:  [98.2 F (36.8 C)-98.6 F (37 C)] 98.6 F (37 C) (06/24 0416) Pulse Rate:  [76-80] 76 (06/24 0416) Resp:  [16-18] 16 (06/24 0416) BP: (117-144)/(64-83) 117/64 (06/24 0416) SpO2:  [94 %-98 %] 94 % (06/24 0741) Last BM Date: 10/17/16  Intake/Output from previous day: 06/23 0701 - 06/24 0700 In: 2577.9 [P.O.:180; I.V.:2397.9] Out: -  Intake/Output this shift: No intake/output data recorded.  General appearance: alert, cooperative and no distress GI: Nondistended. Minimal if any lower abdominal tenderness, no guarding  Lab Results:   Recent Labs  10/15/16 1038 10/15/16 1050 10/16/16 0424  WBC 21.4*  --  15.9*  HGB 15.6* 15.6* 12.2  HCT 42.3 46.0 34.5*  PLT 247  --  218   BMET  Recent Labs  10/16/16 0424 10/17/16 0412  NA 132* 136  K 4.5 4.2  CL 98* 102  CO2 26 26  GLUCOSE 150* 153*  BUN 14 18  CREATININE 0.57 0.51  CALCIUM 8.5* 8.8*     Studies/Results: Ct Abdomen Pelvis W Contrast  Result Date: 10/15/2016 CLINICAL DATA:  Abdominal pain, constipation. EXAM: CT ABDOMEN AND PELVIS WITH CONTRAST TECHNIQUE: Multidetector CT imaging of the abdomen and pelvis was performed using the standard protocol following bolus administration of intravenous contrast. CONTRAST:  121mL ISOVUE-300 IOPAMIDOL (ISOVUE-300) INJECTION 61% COMPARISON:  CT scan of October 11, 2016. FINDINGS: Lower chest: No acute abnormality. Hepatobiliary: No gallstones are noted. Fatty infiltration of the liver is noted. Pancreas: Unremarkable. No pancreatic ductal dilatation or surrounding inflammatory changes. Spleen:  Normal in size without focal abnormality. Adrenals/Urinary Tract: Adrenal glands are unremarkable. Kidneys are normal, without renal calculi, focal lesion, or hydronephrosis. Bladder is unremarkable. Stomach/Bowel: Sigmoid diverticulosis is noted without inflammation. The appendix appears normal. There are noted several loops of dilated small bowel in the left upper quadrant as well as in the pelvis. Fecalization of small bowel is noted in the pelvis with mild wall thickening and surrounding inflammatory changes suggesting enteritis. Obstruction secondary to adhesions cannot be excluded. Stool is noted in the colon. Vascular/Lymphatic: Aortic atherosclerosis. No enlarged abdominal or pelvic lymph nodes. Reproductive: Status post hysterectomy. No adnexal masses. Other: Small amount of free fluid is noted in the pelvis as well as adjacent to the liver. Musculoskeletal: No acute or significant osseous findings. IMPRESSION: Fatty infiltration of the liver. Sigmoid diverticulosis without inflammation. Aortic atherosclerosis. Mildly dilated small bowel loops are noted in the left upper quadrant of the abdomen. Also noted are mildly dilated small bowel loops with fecalization, mild wall thickening and surrounding inflammatory changes in the pelvis. This may be due to enteritis, but partial small bowel obstruction cannot be excluded. Electronically Signed   By: Marijo Conception, M.D.   On: 10/15/2016 11:54   Dg Abd Acute W/chest  Result Date: 10/16/2016 CLINICAL DATA:  Small bowel obstruction. EXAM: DG ABDOMEN ACUTE W/ 1V CHEST COMPARISON:  CT scans dated 10/15/2016 and 10/11/2016 and chest x-ray dated 11/10/2015 FINDINGS: There is slight dilatation of small bowel loops in the right mid abdomen. Contrast from the prior CT scan  has passed into the nondistended colon. No free air. New slight atelectasis and small effusion on the right. Scarring in the left upper lobe as described on the recent CT scan of the chest.  IMPRESSION: 1. Slightly dilated small bowel loops in the right mid abdomen. This could represent a partial small bowel obstruction. However, contrast has passed into the nondistended colon. 2. Slight atelectasis and effusion at the right base. 3. Scarring in the left upper lobe as previously described on CT scan of 10/11/2016. Electronically Signed   By: Lorriane Shire M.D.   On: 10/16/2016 10:29    Anti-infectives: Anti-infectives    None      Assessment/Plan: Enteritis of uncertain etiology which appeared to cause a partial small bowel obstruction. Steady clinical improvement since admission. X-ray yesterday showed all contrast in the colon and minimal bowel dilatation. I'm ordering clear liquid diet. She is very anxious to go home and I think if she tolerates this this morning she could be discharged and gradually advance her diet at home.     LOS: 2 days    Jammie Clink T 10/17/2016

## 2016-10-19 NOTE — Progress Notes (Signed)
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      Indication for treatment:  Palliation       Radiation treatment dates:   10/07/2016  Site/dose:   PTV1 Left frontal parietal 21 mm was treated to 18 Gy in 1 fraction.  Beams/energy:   SRS // 6 MV  Narrative: The patient tolerated radiation treatment relatively well.   The patient did not experience any adverse reaction to stereotactic radiosurgery.  Plan: The patient has completed radiation treatment. The patient will return to radiation oncology clinic for routine followup in one month. I advised them to call or return sooner if they have any questions or concerns related to their recovery or treatment.  ------------------------------------------------  Jodelle Gross, MD, PhD  This document serves as a record of services personally performed by Kyung Rudd, MD. It was created on his behalf by Arlyce Harman, a trained medical scribe. The creation of this record is based on the scribe's personal observations and the provider's statements to them. This document has been checked and approved by the attending provider.

## 2016-10-21 ENCOUNTER — Telehealth: Payer: Self-pay | Admitting: Oncology

## 2016-10-21 ENCOUNTER — Telehealth: Payer: Self-pay | Admitting: Internal Medicine

## 2016-10-21 ENCOUNTER — Telehealth: Payer: Self-pay | Admitting: Medical Oncology

## 2016-10-21 ENCOUNTER — Ambulatory Visit: Payer: 59 | Admitting: Internal Medicine

## 2016-10-21 NOTE — Telephone Encounter (Signed)
Patient called back and said she would like to cancel the appointment with Dr. Sondra Come on 10/25/16 because she said that there has been progression and will need chemotherapy and is not interested in radiation at this time.  She does not want to have to drive to [facility] for this appointment.  Advised her the appointment will be canceled and to keep her follow up with Shona Simpson, PA-C on 7/16.  Jase verbalized agreement and understanding.  She was transferred to the Medical Oncology scheduler to check on her chemotherapy schedule.

## 2016-10-21 NOTE — Telephone Encounter (Signed)
Pt unable to come in tomorrow for treatment tomorrow. Note to [place].

## 2016-10-21 NOTE — Telephone Encounter (Signed)
Patient needs to have a hospital F/U but there are no upcoming appointments.

## 2016-10-21 NOTE — Telephone Encounter (Signed)
A scheduled for first treatment left message for patient with appt date and time.

## 2016-10-21 NOTE — Telephone Encounter (Signed)
Patient called and said she was discharged from the hospital for a small bowel obstruction and was told she needs to follow up with her Radiation doctor.  She is also wondering if she will have chemotherapy next week.  Called her back and left a message that she has an appointment scheduled with Dr. Sondra Come on Monday.  Requested a return call.

## 2016-10-22 ENCOUNTER — Ambulatory Visit: Payer: 59

## 2016-10-22 NOTE — Telephone Encounter (Signed)
She was supposed to start this week.

## 2016-10-22 NOTE — Progress Notes (Signed)
  Radiation Oncology         [phone]) [phone] ________________________________  Name: LETICA GIAIMO MRN: [id number]  Date: 10/07/2016  DOB: 08-31-1955   SPECIAL TREATMENT PROCEDURE   3D TREATMENT PLANNING AND DOSIMETRY: The patient's radiation plan was reviewed and approved by Dr. Vertell Limber from neurosurgery and radiation oncology prior to treatment. It showed 3-dimensional radiation distributions overlaid onto the planning CT/MRI image set. The [facility] for the target structures as well as the organs at risk were reviewed. The documentation of the 3D plan and dosimetry are filed in the radiation oncology EMR.   NARRATIVE: The patient was brought to the TrueBeam stereotactic radiation treatment machine and placed supine on the CT couch. The head frame was applied, and the patient was set up for stereotactic radiosurgery. Neurosurgery was present for the set-up and delivery   SIMULATION VERIFICATION: In the couch zero-angle position, the patient underwent Exactrac imaging using the Brainlab system with orthogonal KV images. These were carefully aligned and repeated to confirm treatment position for each of the isocenters. The Exactrac snap film verification was repeated at each couch angle.   SPECIAL TREATMENT PROCEDURE: The patient received stereotactic radiosurgery to the following target:  PTV1 target was treated using 4 Arcs to a prescription dose of 18 Gy. ExacTrac Snap verification was performed for each couch angle.   STEREOTACTIC TREATMENT MANAGEMENT: Following delivery, the patient was transported to nursing in stable condition and monitored for possible acute effects. Vital signs were recorded . The patient tolerated treatment without significant acute effects, and was discharged to home in stable condition.  PLAN: Follow-up in one month.   ------------------------------------------------  Jodelle Gross, MD, PhD

## 2016-10-22 NOTE — Progress Notes (Signed)
  Radiation Oncology         [phone]) [phone] ________________________________  Name: Sharon [name] MRN: [id number]  Date: 10/01/2016  DOB: 02/08/1956  DIAGNOSIS:     ICD-10-CM   1. Brain metastasis ([place]) C79.31     NARRATIVE:  The patient was brought to the [place].  Identity was confirmed.  All relevant records and images related to the planned course of therapy were reviewed.  The patient freely provided informed written consent to proceed with treatment after reviewing the details related to the planned course of therapy. The consent form was witnessed and verified by the simulation staff. Intravenous access was established for contrast administration. Then, the patient was set-up in a stable reproducible supine position for radiation therapy.  A relocatable thermoplastic stereotactic head frame was fabricated for precise immobilization.  CT images were obtained.  Surface markings were placed.  The CT images were loaded into the planning software and fused with the patient's targeting MRI scan.  Then the target and avoidance structures were contoured.  Treatment planning then occurred.  The radiation prescription was entered and confirmed.  I have requested 3D planning  I have requested a DVH of the following structures: Brain stem, brain, left eye, right eye, lenses, optic chiasm, target volumes, uninvolved brain, and normal tissue.    SPECIAL TREATMENT PROCEDURE:  The planned course of therapy using radiation constitutes a special treatment procedure. Special care is required in the management of this patient for the following reasons. This treatment constitutes a Special Treatment Procedure for the following reason: High dose per fraction requiring special monitoring for increased toxicities of treatment including daily imaging.  The special nature of the planned course of radiotherapy will require increased physician supervision and oversight to ensure patient's safety with  optimal treatment outcomes.  PLAN:  The patient will receive 18 Gy in 1 fraction.   ------------------------------------------------  Jodelle Gross, MD, PhD

## 2016-10-25 ENCOUNTER — Ambulatory Visit: Payer: 59 | Admitting: Radiation Oncology

## 2016-10-26 ENCOUNTER — Telehealth: Payer: Self-pay | Admitting: *Deleted

## 2016-10-26 NOTE — Telephone Encounter (Signed)
Reviewed with MD, pt to come for lab/infusion on 7/6 and 7/13 with f/u lab/MD /chemo on 7/27. Message to scheduling.

## 2016-10-29 ENCOUNTER — Ambulatory Visit (HOSPITAL_BASED_OUTPATIENT_CLINIC_OR_DEPARTMENT_OTHER): Payer: 59

## 2016-10-29 ENCOUNTER — Other Ambulatory Visit (HOSPITAL_BASED_OUTPATIENT_CLINIC_OR_DEPARTMENT_OTHER): Payer: 59

## 2016-10-29 VITALS — BP 114/62 | HR 70 | Temp 98.0°F | Resp 16

## 2016-10-29 DIAGNOSIS — C3492 Malignant neoplasm of unspecified part of left bronchus or lung: Secondary | ICD-10-CM

## 2016-10-29 DIAGNOSIS — C3412 Malignant neoplasm of upper lobe, left bronchus or lung: Secondary | ICD-10-CM

## 2016-10-29 DIAGNOSIS — Z5111 Encounter for antineoplastic chemotherapy: Secondary | ICD-10-CM

## 2016-10-29 LAB — COMPREHENSIVE METABOLIC PANEL
ALBUMIN: 2.7 g/dL — AB (ref 3.5–5.0)
ALK PHOS: 87 U/L (ref 40–150)
ALT: 45 U/L (ref 0–55)
ANION GAP: 12 meq/L — AB (ref 3–11)
AST: 16 U/L (ref 5–34)
BILIRUBIN TOTAL: 0.36 mg/dL (ref 0.20–1.20)
BUN: 15.1 mg/dL (ref 7.0–26.0)
CO2: 23 mEq/L (ref 22–29)
CREATININE: 0.7 mg/dL (ref 0.6–1.1)
Calcium: 9.9 mg/dL (ref 8.4–10.4)
Chloride: 100 mEq/L (ref 98–109)
EGFR: 90 mL/min/{1.73_m2} — AB (ref >90)
Glucose: 176 mg/dl — ABNORMAL HIGH (ref 70–140)
Potassium: 3.8 mEq/L (ref 3.5–5.1)
Sodium: 136 mEq/L (ref 136–145)
TOTAL PROTEIN: 6 g/dL — AB (ref 6.4–8.3)

## 2016-10-29 LAB — CBC WITH DIFFERENTIAL/PLATELET
BASO%: 0.2 % (ref 0.0–2.0)
BASOS ABS: 0 10*3/uL (ref 0.0–0.1)
EOS%: 0 % (ref 0.0–7.0)
Eosinophils Absolute: 0 10*3/uL (ref 0.0–0.5)
HCT: 39.7 % (ref 34.8–46.6)
HGB: 13.6 g/dL (ref 11.6–15.9)
LYMPH%: 11.6 % — ABNORMAL LOW (ref 14.0–49.7)
MCH: 33.4 pg (ref 25.1–34.0)
MCHC: 34.2 g/dL (ref 31.5–36.0)
MCV: 97.4 fL (ref 79.5–101.0)
MONO#: 0.3 10*3/uL (ref 0.1–0.9)
MONO%: 5.3 % (ref 0.0–14.0)
NEUT#: 4.5 10*3/uL (ref 1.5–6.5)
NEUT%: 82.9 % — AB (ref 38.4–76.8)
Platelets: 207 10*3/uL (ref 145–400)
RBC: 4.07 10*6/uL (ref 3.70–5.45)
RDW: 14.8 % — ABNORMAL HIGH (ref 11.2–14.5)
WBC: 5.4 10*3/uL (ref 3.9–10.3)
lymph#: 0.6 10*3/uL — ABNORMAL LOW (ref 0.9–3.3)

## 2016-10-29 LAB — MAGNESIUM: MAGNESIUM: 1.9 mg/dL (ref 1.5–2.5)

## 2016-10-29 LAB — TECHNOLOGIST REVIEW

## 2016-10-29 MED ORDER — FOSAPREPITANT DIMEGLUMINE INJECTION 150 MG
Freq: Once | INTRAVENOUS | Status: AC
  Administered 2016-10-29: 12:00:00 via INTRAVENOUS
  Filled 2016-10-29: qty 5

## 2016-10-29 MED ORDER — CISPLATIN CHEMO INJECTION 100MG/100ML
30.0000 mg/m2 | Freq: Once | INTRAVENOUS | Status: AC
  Administered 2016-10-29: 68 mg via INTRAVENOUS
  Filled 2016-10-29: qty 68

## 2016-10-29 MED ORDER — PALONOSETRON HCL INJECTION 0.25 MG/5ML
INTRAVENOUS | Status: AC
  Filled 2016-10-29: qty 5

## 2016-10-29 MED ORDER — ATROPINE SULFATE 1 MG/ML IJ SOLN
INTRAMUSCULAR | Status: AC
  Filled 2016-10-29: qty 1

## 2016-10-29 MED ORDER — IRINOTECAN HCL CHEMO INJECTION 100 MG/5ML
65.0000 mg/m2 | Freq: Once | INTRAVENOUS | Status: AC
  Administered 2016-10-29: 140 mg via INTRAVENOUS
  Filled 2016-10-29: qty 5

## 2016-10-29 MED ORDER — SODIUM CHLORIDE 0.9 % IV SOLN
Freq: Once | INTRAVENOUS | Status: AC
  Administered 2016-10-29: 12:00:00 via INTRAVENOUS

## 2016-10-29 MED ORDER — PALONOSETRON HCL INJECTION 0.25 MG/5ML
0.2500 mg | Freq: Once | INTRAVENOUS | Status: AC
  Administered 2016-10-29: 0.25 mg via INTRAVENOUS

## 2016-10-29 MED ORDER — ATROPINE SULFATE 1 MG/ML IJ SOLN: 0.5000 mg | Freq: Once | INTRAMUSCULAR | Status: DC | PRN

## 2016-10-29 MED ORDER — POTASSIUM CHLORIDE 2 MEQ/ML IV SOLN
Freq: Once | INTRAVENOUS | Status: AC
  Administered 2016-10-29: 10:00:00 via INTRAVENOUS
  Filled 2016-10-29: qty 10

## 2016-10-29 NOTE — Patient Instructions (Addendum)
[name] Discharge Instructions for Patients Receiving Chemotherapy  Today you received the following chemotherapy agents: Cisplatin and Irinotecan.   To help prevent nausea and vomiting after your treatment, we encourage you to take your nausea medication as prescribed.    If you develop nausea and vomiting that is not controlled by your nausea medication, call the clinic.   BELOW ARE SYMPTOMS THAT SHOULD BE REPORTED IMMEDIATELY:  *FEVER GREATER THAN 100.5 F  *CHILLS WITH OR WITHOUT FEVER  NAUSEA AND VOMITING THAT IS NOT CONTROLLED WITH YOUR NAUSEA MEDICATION  *UNUSUAL SHORTNESS OF BREATH  *UNUSUAL BRUISING OR BLEEDING  TENDERNESS IN MOUTH AND THROAT WITH OR WITHOUT PRESENCE OF ULCERS  *URINARY PROBLEMS  *BOWEL PROBLEMS  UNUSUAL RASH Items with * indicate a potential emergency and should be followed up as soon as possible.  Feel free to call the clinic you have any questions or concerns. The clinic phone number is [phone].  Please show the [place] at check-in to the Emergency Department and triage nurse.  Cisplatin injection What is this medicine? CISPLATIN (SIS pla tin) is a chemotherapy drug. It targets fast dividing cells, like cancer cells, and causes these cells to die. This medicine is used to treat many types of cancer like bladder, ovarian, and testicular cancers. This medicine may be used for other purposes; ask your health care provider or pharmacist if you have questions. COMMON BRAND NAME(S): Platinol, Platinol -AQ What should I tell my health care provider before I take this medicine? They need to know if you have any of these conditions: -blood disorders -hearing problems -kidney disease -recent or ongoing radiation therapy -an unusual or allergic reaction to cisplatin, carboplatin, other chemotherapy, other medicines, foods, dyes, or preservatives -pregnant or trying to get pregnant -breast-feeding How should I use this  medicine? This drug is given as an infusion into a vein. It is administered in a hospital or clinic by a specially trained health care professional. Talk to your pediatrician regarding the use of this medicine in children. Special care may be needed. Overdosage: If you think you have taken too much of this medicine contact a poison control center or emergency room at once. NOTE: This medicine is only for you. Do not share this medicine with others. What if I miss a dose? It is important not to miss a dose. Call your doctor or health care professional if you are unable to keep an appointment. What may interact with this medicine? -dofetilide -foscarnet -medicines for seizures -medicines to increase blood counts like filgrastim, pegfilgrastim, sargramostim -probenecid -pyridoxine used with altretamine -rituximab -some antibiotics like amikacin, gentamicin, neomycin, polymyxin B, streptomycin, tobramycin -sulfinpyrazone -vaccines -zalcitabine Talk to your doctor or health care professional before taking any of these medicines: -acetaminophen -aspirin -ibuprofen -ketoprofen -naproxen This list may not describe all possible interactions. Give your health care provider a list of all the medicines, herbs, non-prescription drugs, or dietary supplements you use. Also tell them if you smoke, drink alcohol, or use illegal drugs. Some items may interact with your medicine. What should I watch for while using this medicine? Your condition will be monitored carefully while you are receiving this medicine. You will need important blood work done while you are taking this medicine. This drug may make you feel generally unwell. This is not uncommon, as chemotherapy can affect healthy cells as well as cancer cells. Report any side effects. Continue your course of treatment even though you feel ill unless your doctor tells  you to stop. In some cases, you may be given additional medicines to help with side  effects. Follow all directions for their use. Call your doctor or health care professional for advice if you get a fever, chills or sore throat, or other symptoms of a cold or flu. Do not treat yourself. This drug decreases your body's ability to fight infections. Try to avoid being around people who are sick. This medicine may increase your risk to bruise or bleed. Call your doctor or health care professional if you notice any unusual bleeding. Be careful brushing and flossing your teeth or using a toothpick because you may get an infection or bleed more easily. If you have any dental work done, tell your dentist you are receiving this medicine. Avoid taking products that contain aspirin, acetaminophen, ibuprofen, naproxen, or ketoprofen unless instructed by your doctor. These medicines may hide a fever. Do not become pregnant while taking this medicine. Women should inform their doctor if they wish to become pregnant or think they might be pregnant. There is a potential for serious side effects to an unborn child. Talk to your health care professional or pharmacist for more information. Do not breast-feed an infant while taking this medicine. Drink fluids as directed while you are taking this medicine. This will help protect your kidneys. Call your doctor or health care professional if you get diarrhea. Do not treat yourself. What side effects may I notice from receiving this medicine? Side effects that you should report to your doctor or health care professional as soon as possible: -allergic reactions like skin rash, itching or hives, swelling of the face, lips, or tongue -signs of infection - fever or chills, cough, sore throat, pain or difficulty passing urine -signs of decreased platelets or bleeding - bruising, pinpoint red spots on the skin, black, tarry stools, nosebleeds -signs of decreased red blood cells - unusually weak or tired, fainting spells, lightheadedness -breathing  problems -changes in hearing -gout pain -low blood counts - This drug may decrease the number of white blood cells, red blood cells and platelets. You may be at increased risk for infections and bleeding. -nausea and vomiting -pain, swelling, redness or irritation at the injection site -pain, tingling, numbness in the hands or feet -problems with balance, movement -trouble passing urine or change in the amount of urine Side effects that usually do not require medical attention (report to your doctor or health care professional if they continue or are bothersome): -changes in vision -loss of appetite -metallic taste in the mouth or changes in taste This list may not describe all possible side effects. Call your doctor for medical advice about side effects. You may report side effects to FDA at 1-800-FDA-1088. Where should I keep my medicine? This drug is given in a hospital or clinic and will not be stored at home. NOTE: This sheet is a summary. It may not cover all possible information. If you have questions about this medicine, talk to your doctor, pharmacist, or health care provider.  2018 Elsevier/Gold Standard (2007-07-18 14:40:54) Irinotecan injection What is this medicine? IRINOTECAN (ir in oh TEE kan ) is a chemotherapy drug. It is used to treat colon and rectal cancer. This medicine may be used for other purposes; ask your health care provider or pharmacist if you have questions. COMMON BRAND NAME(S): Camptosar What should I tell my health care provider before I take this medicine? They need to know if you have any of these conditions: -blood disorders -dehydration -diarrhea -  infection (especially a virus infection such as chickenpox, cold sores, or herpes) -liver disease -low blood counts, like low white cell, platelet, or red cell counts -recent or ongoing radiation therapy -an unusual or allergic reaction to irinotecan, sorbitol, other chemotherapy, other medicines, foods,  dyes, or preservatives -pregnant or trying to get pregnant -breast-feeding How should I use this medicine? This drug is given as an infusion into a vein. It is administered in a hospital or clinic by a specially trained health care professional. Talk to your pediatrician regarding the use of this medicine in children. Special care may be needed. Overdosage: If you think you have taken too much of this medicine contact a poison control center or emergency room at once. NOTE: This medicine is only for you. Do not share this medicine with others. What if I miss a dose? It is important not to miss your dose. Call your doctor or health care professional if you are unable to keep an appointment. What may interact with this medicine? Do not take this medicine with any of the following medications: -atazanavir -certain medicines for fungal infections like itraconazole and ketoconazole -St. John's Wort This medicine may also interact with the following medications: -dexamethasone -diuretics -laxatives -medicines for seizures like carbamazepine, mephobarbital, phenobarbital, phenytoin, primidone -medicines to increase blood counts like filgrastim, pegfilgrastim, sargramostim -prochlorperazine -vaccines This list may not describe all possible interactions. Give your health care provider a list of all the medicines, herbs, non-prescription drugs, or dietary supplements you use. Also tell them if you smoke, drink alcohol, or use illegal drugs. Some items may interact with your medicine. What should I watch for while using this medicine? Your condition will be monitored carefully while you are receiving this medicine. You will need important blood work done while you are taking this medicine. This drug may make you feel generally unwell. This is not uncommon, as chemotherapy can affect healthy cells as well as cancer cells. Report any side effects. Continue your course of treatment even though you feel  ill unless your doctor tells you to stop. In some cases, you may be given additional medicines to help with side effects. Follow all directions for their use. You may get drowsy or dizzy. Do not drive, use machinery, or do anything that needs mental alertness until you know how this medicine affects you. Do not stand or sit up quickly, especially if you are an older patient. This reduces the risk of dizzy or fainting spells. Call your doctor or health care professional for advice if you get a fever, chills or sore throat, or other symptoms of a cold or flu. Do not treat yourself. This drug decreases your body's ability to fight infections. Try to avoid being around people who are sick. This medicine may increase your risk to bruise or bleed. Call your doctor or health care professional if you notice any unusual bleeding. Be careful brushing and flossing your teeth or using a toothpick because you may get an infection or bleed more easily. If you have any dental work done, tell your dentist you are receiving this medicine. Avoid taking products that contain aspirin, acetaminophen, ibuprofen, naproxen, or ketoprofen unless instructed by your doctor. These medicines may hide a fever. Do not become pregnant while taking this medicine. Women should inform their doctor if they wish to become pregnant or think they might be pregnant. There is a potential for serious side effects to an unborn child. Talk to your health care professional or pharmacist for more  information. Do not breast-feed an infant while taking this medicine. What side effects may I notice from receiving this medicine? Side effects that you should report to your doctor or health care professional as soon as possible: -allergic reactions like skin rash, itching or hives, swelling of the face, lips, or tongue -low blood counts - this medicine may decrease the number of white blood cells, red blood cells and platelets. You may be at increased risk  for infections and bleeding. -signs of infection - fever or chills, cough, sore throat, pain or difficulty passing urine -signs of decreased platelets or bleeding - bruising, pinpoint red spots on the skin, black, tarry stools, blood in the urine -signs of decreased red blood cells - unusually weak or tired, fainting spells, lightheadedness -breathing problems -chest pain -diarrhea -feeling faint or lightheaded, falls -flushing, runny nose, sweating during infusion -mouth sores or pain -pain, swelling, redness or irritation where injected -pain, swelling, warmth in the leg -pain, tingling, numbness in the hands or feet -problems with balance, talking, walking -stomach cramps, pain -trouble passing urine or change in the amount of urine -vomiting as to be unable to hold down drinks or food -yellowing of the eyes or skin Side effects that usually do not require medical attention (report to your doctor or health care professional if they continue or are bothersome): -constipation -hair loss -headache -loss of appetite -nausea, vomiting -stomach upset This list may not describe all possible side effects. Call your doctor for medical advice about side effects. You may report side effects to FDA at 1-800-FDA-1088. Where should I keep my medicine? This drug is given in a hospital or clinic and will not be stored at home. NOTE: This sheet is a summary. It may not cover all possible information. If you have questions about this medicine, talk to your doctor, pharmacist, or health care provider.  2018 Elsevier/Gold Standard (2012-10-09 16:29:32)

## 2016-11-02 ENCOUNTER — Telehealth: Payer: Self-pay | Admitting: *Deleted

## 2016-11-02 NOTE — Telephone Encounter (Addendum)
FYI "Patient called PCP for refill on Decadron and Keppra for seizures.  These were ordered by hospitalist Glenard Haring Arrien June 8th after admission for seizures.  Does not have a neurologist.  She is under active chemotherapy.  Should Oncology manage these refills."  Asked Tamika to have patient's pharmacy direct refill request to [facility] for review by Medical Oncology.

## 2016-11-03 ENCOUNTER — Other Ambulatory Visit: Payer: Self-pay | Admitting: *Deleted

## 2016-11-04 ENCOUNTER — Telehealth: Payer: Self-pay | Admitting: Medical Oncology

## 2016-11-04 ENCOUNTER — Other Ambulatory Visit: Payer: Self-pay | Admitting: Medical Oncology

## 2016-11-04 ENCOUNTER — Other Ambulatory Visit: Payer: Self-pay | Admitting: Internal Medicine

## 2016-11-04 DIAGNOSIS — C7931 Secondary malignant neoplasm of brain: Secondary | ICD-10-CM

## 2016-11-04 MED ORDER — DEXAMETHASONE 4 MG PO TABS: 4.0000 mg | ORAL_TABLET | Freq: Two times a day (BID) | ORAL | 0 refills | Status: DC

## 2016-11-04 MED ORDER — LEVETIRACETAM 500 MG PO TABS: 500.0000 mg | ORAL_TABLET | Freq: Two times a day (BID) | ORAL | 0 refills | Status: DC

## 2016-11-04 NOTE — Plan of Care (Signed)
Keppra and decadron refilled the patient notified.

## 2016-11-04 NOTE — Telephone Encounter (Signed)
Clarified decadron dose with pt . She stated she is taking 4mg  bid and taking Keppra. Refills sent for both meds  to local pharmacy. Diarrhea- started yesterday . " I have been up every 2 hours to go to [facility]" . I asked her how many episodes and she said she has been 3 times since midnight . Took liquid imodium . I instructed her to take a double dose after first diarrheal stool of day then 1 dose after each subsequent stool up to 6 times a day. Reviewed diet ( [facility]) instructions and to take gatorade, water and to call for worsening diarrhea.

## 2016-11-04 NOTE — Telephone Encounter (Signed)
I have not- Thayer Headings can you see where she is in her Dex dosing and make sure she has enough?

## 2016-11-04 NOTE — Telephone Encounter (Signed)
Decadron refill faxed to pharmacy[Keppra escribed.

## 2016-11-05 ENCOUNTER — Ambulatory Visit (HOSPITAL_BASED_OUTPATIENT_CLINIC_OR_DEPARTMENT_OTHER): Payer: 59

## 2016-11-05 ENCOUNTER — Other Ambulatory Visit: Payer: Self-pay | Admitting: Medical Oncology

## 2016-11-05 ENCOUNTER — Other Ambulatory Visit (HOSPITAL_BASED_OUTPATIENT_CLINIC_OR_DEPARTMENT_OTHER): Payer: 59

## 2016-11-05 VITALS — BP 133/87 | HR 88 | Temp 98.2°F | Resp 17

## 2016-11-05 DIAGNOSIS — C3492 Malignant neoplasm of unspecified part of left bronchus or lung: Secondary | ICD-10-CM

## 2016-11-05 DIAGNOSIS — E860 Dehydration: Secondary | ICD-10-CM | POA: Diagnosis not present

## 2016-11-05 DIAGNOSIS — C3412 Malignant neoplasm of upper lobe, left bronchus or lung: Secondary | ICD-10-CM | POA: Diagnosis not present

## 2016-11-05 DIAGNOSIS — Z5189 Encounter for other specified aftercare: Secondary | ICD-10-CM | POA: Diagnosis not present

## 2016-11-05 LAB — MANUAL DIFFERENTIAL
ALC: 0.3 10*3/uL — ABNORMAL LOW (ref 0.9–3.3)
ANC (CHCC MAN DIFF): 0.7 10*3/uL — AB (ref 1.5–6.5)
BAND NEUTROPHILS: 17 % — AB (ref 0–10)
BLASTS: 0 % (ref 0–0)
Basophil: 0 % (ref 0–2)
EOS%: 0 % (ref 0–7)
LYMPH: 30 % (ref 14–49)
MONO: 5 % (ref 0–14)
MYELOCYTES: 0 % (ref 0–0)
Metamyelocytes: 0 % (ref 0–0)
Other Cell: 0 % (ref 0–0)
PLT EST: DECREASED
PROMYELO: 0 % (ref 0–0)
RBC COMMENTS: NORMAL
SEG: 48 % (ref 38–77)
Variant Lymph: 0 % (ref 0–0)
nRBC: 1 % — ABNORMAL HIGH (ref 0–0)

## 2016-11-05 LAB — COMPREHENSIVE METABOLIC PANEL
ALBUMIN: 2.4 g/dL — AB (ref 3.5–5.0)
ALK PHOS: 83 U/L (ref 40–150)
ALT: 47 U/L (ref 0–55)
ANION GAP: 14 meq/L — AB (ref 3–11)
AST: 19 U/L (ref 5–34)
BUN: 13.6 mg/dL (ref 7.0–26.0)
CALCIUM: 9.4 mg/dL (ref 8.4–10.4)
CO2: 25 mEq/L (ref 22–29)
Chloride: 98 mEq/L (ref 98–109)
Creatinine: 0.6 mg/dL (ref 0.6–1.1)
Glucose: 120 mg/dl (ref 70–140)
POTASSIUM: 3.5 meq/L (ref 3.5–5.1)
Sodium: 137 mEq/L (ref 136–145)
Total Bilirubin: 0.42 mg/dL (ref 0.20–1.20)
Total Protein: 5.8 g/dL — ABNORMAL LOW (ref 6.4–8.3)

## 2016-11-05 LAB — CBC WITH DIFFERENTIAL/PLATELET
HEMATOCRIT: 38.7 % (ref 34.8–46.6)
HGB: 13.3 g/dL (ref 11.6–15.9)
MCH: 33 pg (ref 25.1–34.0)
MCHC: 34.4 g/dL (ref 31.5–36.0)
MCV: 96 fL (ref 79.5–101.0)
RBC: 4.04 10*6/uL (ref 3.70–5.45)
RDW: 14.3 % (ref 11.2–14.5)
WBC: 1.1 10*3/uL — ABNORMAL LOW (ref 3.9–10.3)

## 2016-11-05 LAB — MAGNESIUM: Magnesium: 2.2 mg/dl (ref 1.5–2.5)

## 2016-11-05 MED ORDER — TBO-FILGRASTIM 480 MCG/0.8ML ~~LOC~~ SOSY
480.0000 ug | PREFILLED_SYRINGE | Freq: Once | SUBCUTANEOUS | Status: DC
  Filled 2016-11-05: qty 0.8

## 2016-11-05 MED ORDER — SODIUM CHLORIDE 0.9 % IV SOLN
Freq: Once | INTRAVENOUS | Status: AC
  Administered 2016-11-05: 12:00:00 via INTRAVENOUS

## 2016-11-05 MED ORDER — PEGFILGRASTIM INJECTION 6 MG/0.6ML ~~LOC~~
6.0000 mg | PREFILLED_SYRINGE | Freq: Once | SUBCUTANEOUS | Status: AC
  Administered 2016-11-05: 6 mg via SUBCUTANEOUS
  Filled 2016-11-05: qty 0.6

## 2016-11-05 NOTE — Patient Instructions (Addendum)
Neutropenia Neutropenia is a condition that occurs when you have a lower-than-normal level of a type of white blood cell (neutrophil) in your body. Neutrophils are made in the spongy center of large bones (bone marrow) and they fight infections. Neutrophils are your body's main defense against bacterial and fungal infections. The fewer neutrophils you have and the longer your body remains without them, the greater your risk of getting a severe infection. What are the causes? This condition can occur if your body uses up or destroys neutrophils faster than your bone marrow can make them. This problem may happen because of:  Bacterial or fungal infection.  Allergic disorders.  Reactions to some medicines.  Autoimmune disease.  An enlarged spleen.  This condition can also occur if your bone marrow does not produce enough neutrophils. This problem may be caused by:  Cancer.  Cancer treatments, such as radiation or chemotherapy.  Viral infections.  Medicines, such as phenytoin.  Vitamin B12 deficiency.  Diseases of the bone marrow.  Environmental toxins, such as insecticides.  What are the signs or symptoms? This condition does not usually cause symptoms. If symptoms are present, they are usually caused by an underlying infection. Symptoms of an infection may include:  Fever.  Chills.  Swollen glands.  Oral or anal ulcers.  Cough and shortness of breath.  Rash.  Skin infection.  Fatigue.  How is this diagnosed? Your health care provider may suspect neutropenia if you have:  A condition that may cause neutropenia.  Symptoms of infection, especially fever.  Frequent and unusual infections.  You will have a medical history and physical exam. Tests will also be done, such as:  A complete blood count (CBC).  A procedure to collect a sample of bone marrow for examination (bone marrow biopsy).  A chest X-ray.  A urine culture.  A blood culture.  How is this  treated? Treatment depends on the underlying cause and severity of your condition. Mild neutropenia may not require treatment. Treatment may include medicines, such as:  Antibiotic medicine given through an IV tube.  Antiviral medicines.  Antifungal medicines.  A medicine to increase neutrophil production (colony-stimulating factor). You may get this drug through an IV tube or by injection.  Steroids given through an IV tube.  If an underlying condition is causing neutropenia, you may need treatment for that condition. If medicines you are taking are causing neutropenia, your health care provider may have you stop taking those medicines. Follow these instructions at home: Medicines  Take over-the-counter and prescription medicines only as told by your health care provider.  Get a seasonal flu shot (influenza vaccine). Lifestyle  Do not eat unpasteurized foods.Do not eat unwashed raw fruits or vegetables.  Avoid exposure to groups of people or children.  Avoid being around people who are sick.  Avoid being around dirt or dust, such as in construction areas or gardens.  Do not provide direct care for pets. Avoid animal droppings. Do not clean litter boxes and bird cages. Hygiene   Bathe daily.  Clean the area between the genitals and the anus (perineal area) after you urinate or have a bowel movement. If you are female, wipe from front to back.  Brush your teeth with a soft toothbrush before and after meals.  Do not use a razor that has a blade. Use an electric razor to remove hair.  Wash your hands often. Make sure others who come in contact with you also wash their hands. If soap and water  are not available, use hand sanitizer. General instructions  Do not have sex unless your health care provider has approved.  Take actions to avoid cuts and burns. For example: ? Be cautious when you use knives. Always cut away from yourself. ? Keep knives in protective sheaths or  guards when not in use. ? Use oven mitts when you cook with a hot stove, oven, or grill. ? Stand a safe distance away from open fires.  Avoid people who received a vaccine in the past 30 days if that vaccine contained a live version of the germ (live vaccine). You should not get a live vaccine. Common live vaccines are varicella, measles, mumps, and rubella.  Do not share food utensils.  Do not use tampons, enemas, or rectal suppositories unless your health care provider has approved.  Keep all appointments as told by your health care provider. This is important. Contact a health care provider if:  You have a fever.  You have chills or you start to shake.  You have: ? A sore throat. ? A warm, red, or tender area on your skin. ? A cough. ? Frequent or painful urination. ? Vaginal discharge or itching.  You develop: ? Sores in your mouth or anus. ? Swollen lymph nodes. ? Red streaks on the skin. ? A rash.  You feel: ? Nauseous or you vomit. ? Very fatigued. ? Short of breath. This information is not intended to replace advice given to you by your health care provider. Make sure you discuss any questions you have with your health care provider. Document Released: 10/02/2001 Document Revised: 09/18/2015 Document Reviewed: 10/23/2014 Elsevier Interactive Patient Education  2018 [employer].   Dehydration, Adult Dehydration is when there is not enough fluid or water in your body. This happens when you lose more fluids than you take in. Dehydration can range from mild to very bad. It should be treated right away to keep it from getting very bad. Symptoms of mild dehydration may include:  Thirst.  Dry lips.  Slightly dry mouth.  Dry, warm skin.  Dizziness. Symptoms of moderate dehydration may include:  Very dry mouth.  Muscle cramps.  Dark pee (urine). Pee may be the color of tea.  Your body making less pee.  Your eyes making fewer tears.  Heartbeat that is  uneven or faster than normal (palpitations).  Headache.  Light-headedness, especially when you stand up from sitting.  Fainting (syncope). Symptoms of very bad dehydration may include:  Changes in skin, such as: ? Cold and clammy skin. ? Blotchy (mottled) or pale skin. ? Skin that does not quickly return to normal after being lightly pinched and let go (poor skin turgor).  Changes in body fluids, such as: ? Feeling very thirsty. ? Your eyes making fewer tears. ? Not sweating when body temperature is high, such as in hot weather. ? Your body making very little pee.  Changes in vital signs, such as: ? Weak pulse. ? Pulse that is more than 100 beats a minute when you are sitting still. ? Fast breathing. ? Low blood pressure.  Other changes, such as: ? Sunken eyes. ? Cold hands and feet. ? Confusion. ? Lack of energy (lethargy). ? Trouble waking up from sleep. ? Short-term weight loss. ? Unconsciousness. Follow these instructions at home:  If told by your doctor, drink an ORS: ? Make an ORS by using instructions on the package. ? Start by drinking small amounts, about  cup (120 mL) every 5-10  minutes. ? Slowly drink more until you have had the amount that your doctor said to have.  Drink enough clear fluid to keep your pee clear or pale yellow. If you were told to drink an ORS, finish the ORS first, then start slowly drinking clear fluids. Drink fluids such as: ? Water. Do not drink only water by itself. Doing that can make the salt (sodium) level in your body get too low (hyponatremia). ? Ice chips. ? Fruit juice that you have added water to (diluted). ? Low-calorie sports drinks.  Avoid: ? Alcohol. ? Drinks that have a lot of sugar. These include high-calorie sports drinks, fruit juice that does not have water added, and soda. ? Caffeine. ? Foods that are greasy or have a lot of fat or sugar.  Take over-the-counter and prescription medicines only as told by your  doctor.  Do not take salt tablets. Doing that can make the salt level in your body get too high (hypernatremia).  Eat foods that have minerals (electrolytes). Examples include bananas, oranges, potatoes, tomatoes, and spinach.  Keep all follow-up visits as told by your doctor. This is important. Contact a doctor if:  You have belly (abdominal) pain that: ? Gets worse. ? Stays in one area (localizes).  You have a rash.  You have a stiff neck.  You get angry or annoyed more easily than normal (irritability).  You are more sleepy than normal.  You have a harder time waking up than normal.  You feel: ? Weak. ? Dizzy. ? Very thirsty.  You have peed (urinated) only a small amount of very dark pee during 6-8 hours. Get help right away if:  You have symptoms of very bad dehydration.  You cannot drink fluids without throwing up (vomiting).  Your symptoms get worse with treatment.  You have a fever.  You have a very bad headache.  You are throwing up or having watery poop (diarrhea) and it: ? Gets worse. ? Does not go away.  You have blood or something green (bile) in your throw-up.  You have blood in your poop (stool). This may cause poop to look black and tarry.  You have not peed in 6-8 hours.  You pass out (faint).  Your heart rate when you are sitting still is more than 100 beats a minute.  You have trouble breathing. This information is not intended to replace advice given to you by your health care provider. Make sure you discuss any questions you have with your health care provider. Document Released: 02/06/2009 Document Revised: 10/31/2015 Document Reviewed: 06/06/2015 Elsevier Interactive Patient Education  2018 [employer].

## 2016-11-05 NOTE — Progress Notes (Signed)
Reviewed labs with Dr. Julien Nordmann, Per Dr. Julien Nordmann no treatment today, pt to receive 480 mcg of Granix.  Discharge instructions given printed and verbal. Pt verbalizes understanding. Pt stable at discharge.

## 2016-11-05 NOTE — Addendum Note (Signed)
Addended by: Sinda Du on: 11/05/2016 12:08 PM   Modules accepted: Orders

## 2016-11-08 ENCOUNTER — Ambulatory Visit
Admission: RE | Admit: 2016-11-08 | Discharge: 2016-11-08 | Disposition: A | Discharge status: Home / Self Care | Payer: 59 | Source: Ambulatory Visit | Attending: Radiation Oncology | Admitting: Radiation Oncology

## 2016-11-08 VITALS — BP 144/90 | HR 109 | Temp 98.9°F | Resp 18 | Ht 66.0 in | Wt 225.4 lb

## 2016-11-08 DIAGNOSIS — C7931 Secondary malignant neoplasm of brain: Secondary | ICD-10-CM | POA: Insufficient documentation

## 2016-11-08 DIAGNOSIS — R590 Localized enlarged lymph nodes: Secondary | ICD-10-CM | POA: Diagnosis not present

## 2016-11-08 DIAGNOSIS — Z923 Personal history of irradiation: Secondary | ICD-10-CM | POA: Insufficient documentation

## 2016-11-08 DIAGNOSIS — C3412 Malignant neoplasm of upper lobe, left bronchus or lung: Secondary | ICD-10-CM | POA: Insufficient documentation

## 2016-11-08 DIAGNOSIS — Z853 Personal history of malignant neoplasm of breast: Secondary | ICD-10-CM | POA: Diagnosis not present

## 2016-11-08 DIAGNOSIS — Z510 Encounter for antineoplastic radiation therapy: Secondary | ICD-10-CM | POA: Diagnosis present

## 2016-11-08 DIAGNOSIS — T8130XA Disruption of wound, unspecified, initial encounter: Secondary | ICD-10-CM

## 2016-11-08 DIAGNOSIS — R569 Unspecified convulsions: Secondary | ICD-10-CM | POA: Diagnosis not present

## 2016-11-08 DIAGNOSIS — Z9221 Personal history of antineoplastic chemotherapy: Secondary | ICD-10-CM | POA: Insufficient documentation

## 2016-11-08 DIAGNOSIS — Z7951 Long term (current) use of inhaled steroids: Secondary | ICD-10-CM | POA: Insufficient documentation

## 2016-11-08 DIAGNOSIS — Z79899 Other long term (current) drug therapy: Secondary | ICD-10-CM | POA: Diagnosis not present

## 2016-11-08 NOTE — Progress Notes (Signed)
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 ExacTrac Snap verification was performed for each couch angle.   05/04/16-05/18/16 PCI: 25 Gy in 10 fractions to the whole brain  5*9/18-5/29/18: palliative radiotherapy to the chest 35 Gy in 14 fractions  Narrative:  The patient returns today for routine follow-up.  The patient tolerated the radiotherapy well, of note she did have significant edema and seizure which was her presenting imaging finding and symptom. She has been on a dexamethasone taper, and is currently taking 4 mg daily, with plans to go to 2mg  daily next week. She also was recently admitted for a SBO and managed conservatively. Apparently she had some breakdown of her gluteal cleft. I don't see discussion of what she's to do for follow up in the discharge recommendations.                             On review of systems, the patient states she is doing well from a neurologic standpoint. She denies any difficulty with nausea, vomiting, chest pain, shortness of breath, headaches, speech changes, confusion, auditory or visual changes. She reports that her bowels have been actually between constipation and diarrhea, and she has been using Desitin over her open skin along the gluteal cleft. She has not had any fevers or chills, or drainage from these sites. No other complaints or verbalized.  ALLERGIES:  has No Known Allergies.  Meds: Current Outpatient Prescriptions  Medication Sig Dispense Refill  . albuterol (PROVENTIL HFA;VENTOLIN HFA) 108 (90 Base) MCG/ACT inhaler Inhale 1 puff into the lungs  every 6 (six) hours as needed for wheezing or shortness of breath.    . budesonide-formoterol (SYMBICORT) 160-4.5 MCG/ACT inhaler Inhale 2 puffs into the lungs 2 (two) times daily. 1 Inhaler 11  . Cholecalciferol (VITAMIN D3) 50000 units CAPS Take 1 tablet by mouth once a week.     Marland Kitchen dexamethasone (DECADRON) 4 MG tablet Take 1 tablet (4 mg total) by mouth 2 (two) times daily. take 4 mg twice a day for 7 days then take 1/2 tablet  ( 2 mg) twice a day for 7 days 20 tablet 0  . Ipratropium-Albuterol (COMBIVENT RESPIMAT) 20-100 MCG/ACT AERS respimat Inhale 1 puff into the lungs every 6 (six) hours.    . levETIRAcetam (KEPPRA) 500 MG tablet TAKE 1 TABLET(500 MG) BY MOUTH TWICE DAILY 180 tablet 0  . loperamide (IMODIUM) 2 MG capsule Take by mouth as needed for diarrhea or loose stools.    . montelukast (SINGULAIR) 10 MG tablet Take 10 mg by mouth at bedtime.   2  . polyethylene glycol powder (GLYCOLAX/MIRALAX) powder Take 1 Container by mouth daily.    Marland Kitchen UNABLE TO FIND Take 10 mLs by mouth daily as needed. Med Name: CBD Oil    . fluticasone (FLONASE) 50 MCG/ACT nasal spray Place 1 spray into both nostrils daily.     Marland Kitchen LORazepam (ATIVAN) 0.5 MG tablet 1 tablet po 30 minutes prior to radiation or MRI (Patient not taking: Reported on 11/08/2016) 30  tablet 0  . meclizine (ANTIVERT) 12.5 MG tablet Take 1-2 tablets (12.5-25 mg total) by mouth 3 (three) times daily as needed for dizziness. (Patient not taking: Reported on 11/08/2016) 20 tablet 0  . prochlorperazine (COMPAZINE) 10 MG tablet TAKE 1 TABLET BY MOUTH EVERY 6 HOURS AS NEEDED FOR NAUSEA AND VOMITING (Patient not taking: Reported on 11/08/2016) 360 tablet 1  . senna (SENOKOT) 8.6 MG TABS tablet Take 1 tablet (8.6 mg total) by mouth daily. (Patient not taking: Reported on 11/08/2016) 30 tablet 0   No current facility-administered medications for this encounter.     Physical Findings:  height is 5\' 6"  (1.676 m) and weight is 225 lb 6.4 oz (102.2 kg). Her  oral temperature is 98.9 F (37.2 C). Her blood pressure is 144/90 (abnormal) and her pulse is 109 (abnormal). Her respiration is 18 and oxygen saturation is 97%.  Pain Assessment Pain Score: 0-No pain/10 In general this is a well appearing Caucasian female in no acute distress. She's alert and oriented x4 and appropriate throughout the examination. Cardiopulmonary assessment is negative for acute distress and she exhibits normal effort. Evaluation of her buttocks reveals bilateral changes at the base of the gluteal cleft above her buttocks. The largest area is approximately 2.5 x 1 cm in greatest dimension. No evidence of bleeding is noted. These ulcerations appear to be non-erythematous, and no vesicles or other clusters are identified. Again the patient reports she has had no recent or eruptions. She does not have a history of HSV or shingles.   Lab Findings: Lab Results  Component Value Date   WBC 1.1 (L) 11/05/2016   HGB 13.3 11/05/2016   HCT 38.7 11/05/2016   MCV 96.0 11/05/2016   PLT 102 Large platelets present (L) 11/05/2016     Radiographic Findings: Ct Chest W Contrast  Result Date: 10/11/2016 CLINICAL DATA:  Recurrent non-small cell lung cancer. EXAM: CT CHEST, ABDOMEN, AND PELVIS WITH CONTRAST TECHNIQUE: Multidetector CT imaging of the chest, abdomen and pelvis was performed following the standard protocol during bolus administration of intravenous contrast. CONTRAST:  133mL ISOVUE-300 IOPAMIDOL (ISOVUE-300) INJECTION 61% COMPARISON:  CT scan 06/28/2016 and PET-CT 11/19/2015 FINDINGS: CT CHEST FINDINGS Cardiovascular: The heart is normal in size. No pericardial effusion. The aorta is normal in caliber. Minimal scattered atherosclerotic calcifications. No dissection. The branch vessels are patent. No obvious coronary artery calcifications. Mediastinum/Nodes: 15 mm pretracheal lymph node on image number 17 was not present on the prior study. There is also an adjacent 10 mm node just  posterior to the brachiocephalic vein. Small aorticopulmonary window lymph nodes and subcarinal lymph nodes. There is a necrotic appearing 9 mm hilar lymph node on image number 34. This is new. Lungs/Pleura: Stable emphysematous changes. There are radiation changes involving the left upper lobe. The lung nodule measures 5 mm and previously measured 9 mm. No new pulmonary lesions to suggest pulmonary metastatic disease. No pleural effusion. Chest wall/ Musculoskeletal: No breast masses, chest wall lesion or axillary adenopathy. There is bulky left supraclavicular adenopathy with a nodal mass measuring 4.0 x 3.5 cm on image 7. There is also by an 11 mm supraclavicular lymph node on the same image on the right side. No significant bony findings. No obvious osseous metastatic disease. CT ABDOMEN PELVIS FINDINGS Hepatobiliary: Diffuse fatty infiltration of the liver. No focal hepatic lesions to suggest metastatic disease. The gallbladder is normal. No common bile duct dilatation. Pancreas: No mass, inflammation or ductal dilatation. Spleen: Normal size.  No focal lesions.  Adrenals/Urinary Tract: The adrenal glands and kidneys are unremarkable. There is a small right adrenal gland nodule which is unchanged. It was not hot on the prior PET-CT. The bladder appears normal. Stomach/Bowel: The stomach, duodenum, small bowel and colon are unremarkable. No inflammatory changes, mass lesions or obstructive findings. The terminal ileum is normal. The appendix is normal. Vascular/Lymphatic: The aorta is normal in caliber. The branch vessels are patent. Scattered mesenteric and retroperitoneal lymph nodes but no mass or overt lymphadenopathy. No pelvic lymphadenopathy. Reproductive: The uterus is surgically absent. The right ovary is still present and appears normal. I do not see the left ovary for certain. Other: No pelvic mass or adenopathy. No free pelvic fluid collections. No inguinal mass or adenopathy. No abdominal wall hernia  or subcutaneous lesions. Musculoskeletal: No significant bony findings. IMPRESSION: 1. New bulky left supraclavicular lymphadenopathy and small right supraclavicular adenopathy. 2. New enlarged mediastinal and left hilar lymph nodes. 3. Radiation changes involving the left upper lobe pulmonary nodule which is smaller. No new pulmonary lesions to suggest pulmonary metastatic disease. 4. No findings for metastatic disease involving the abdomen/pelvis. 5. No obvious osseous metastatic disease. Electronically Signed   By: Marijo Sanes M.D.   On: 10/11/2016 16:44   Ct Abdomen Pelvis W Contrast  Result Date: 10/15/2016 CLINICAL DATA:  Abdominal pain, constipation. EXAM: CT ABDOMEN AND PELVIS WITH CONTRAST TECHNIQUE: Multidetector CT imaging of the abdomen and pelvis was performed using the standard protocol following bolus administration of intravenous contrast. CONTRAST:  132mL ISOVUE-300 IOPAMIDOL (ISOVUE-300) INJECTION 61% COMPARISON:  CT scan of October 11, 2016. FINDINGS: Lower chest: No acute abnormality. Hepatobiliary: No gallstones are noted. Fatty infiltration of the liver is noted. Pancreas: Unremarkable. No pancreatic ductal dilatation or surrounding inflammatory changes. Spleen: Normal in size without focal abnormality. Adrenals/Urinary Tract: Adrenal glands are unremarkable. Kidneys are normal, without renal calculi, focal lesion, or hydronephrosis. Bladder is unremarkable. Stomach/Bowel: Sigmoid diverticulosis is noted without inflammation. The appendix appears normal. There are noted several loops of dilated small bowel in the left upper quadrant as well as in the pelvis. Fecalization of small bowel is noted in the pelvis with mild wall thickening and surrounding inflammatory changes suggesting enteritis. Obstruction secondary to adhesions cannot be excluded. Stool is noted in the colon. Vascular/Lymphatic: Aortic atherosclerosis. No enlarged abdominal or pelvic lymph nodes. Reproductive: Status post  hysterectomy. No adnexal masses. Other: Small amount of free fluid is noted in the pelvis as well as adjacent to the liver. Musculoskeletal: No acute or significant osseous findings. IMPRESSION: Fatty infiltration of the liver. Sigmoid diverticulosis without inflammation. Aortic atherosclerosis. Mildly dilated small bowel loops are noted in the left upper quadrant of the abdomen. Also noted are mildly dilated small bowel loops with fecalization, mild wall thickening and surrounding inflammatory changes in the pelvis. This may be due to enteritis, but partial small bowel obstruction cannot be excluded. Electronically Signed   By: Marijo Conception, M.D.   On: 10/15/2016 11:54   Ct Abdomen Pelvis W Contrast  Result Date: 10/11/2016 CLINICAL DATA:  Recurrent non-small cell lung cancer. EXAM: CT CHEST, ABDOMEN, AND PELVIS WITH CONTRAST TECHNIQUE: Multidetector CT imaging of the chest, abdomen and pelvis was performed following the standard protocol during bolus administration of intravenous contrast. CONTRAST:  151mL ISOVUE-300 IOPAMIDOL (ISOVUE-300) INJECTION 61% COMPARISON:  CT scan 06/28/2016 and PET-CT 11/19/2015 FINDINGS: CT CHEST FINDINGS Cardiovascular: The heart is normal in size. No pericardial effusion. The aorta is normal in caliber. Minimal scattered atherosclerotic calcifications. No dissection. The  branch vessels are patent. No obvious coronary artery calcifications. Mediastinum/Nodes: 15 mm pretracheal lymph node on image number 17 was not present on the prior study. There is also an adjacent 10 mm node just posterior to the brachiocephalic vein. Small aorticopulmonary window lymph nodes and subcarinal lymph nodes. There is a necrotic appearing 9 mm hilar lymph node on image number 34. This is new. Lungs/Pleura: Stable emphysematous changes. There are radiation changes involving the left upper lobe. The lung nodule measures 5 mm and previously measured 9 mm. No new pulmonary lesions to suggest pulmonary  metastatic disease. No pleural effusion. Chest wall/ Musculoskeletal: No breast masses, chest wall lesion or axillary adenopathy. There is bulky left supraclavicular adenopathy with a nodal mass measuring 4.0 x 3.5 cm on image 7. There is also by an 11 mm supraclavicular lymph node on the same image on the right side. No significant bony findings. No obvious osseous metastatic disease. CT ABDOMEN PELVIS FINDINGS Hepatobiliary: Diffuse fatty infiltration of the liver. No focal hepatic lesions to suggest metastatic disease. The gallbladder is normal. No common bile duct dilatation. Pancreas: No mass, inflammation or ductal dilatation. Spleen: Normal size.  No focal lesions. Adrenals/Urinary Tract: The adrenal glands and kidneys are unremarkable. There is a small right adrenal gland nodule which is unchanged. It was not hot on the prior PET-CT. The bladder appears normal. Stomach/Bowel: The stomach, duodenum, small bowel and colon are unremarkable. No inflammatory changes, mass lesions or obstructive findings. The terminal ileum is normal. The appendix is normal. Vascular/Lymphatic: The aorta is normal in caliber. The branch vessels are patent. Scattered mesenteric and retroperitoneal lymph nodes but no mass or overt lymphadenopathy. No pelvic lymphadenopathy. Reproductive: The uterus is surgically absent. The right ovary is still present and appears normal. I do not see the left ovary for certain. Other: No pelvic mass or adenopathy. No free pelvic fluid collections. No inguinal mass or adenopathy. No abdominal wall hernia or subcutaneous lesions. Musculoskeletal: No significant bony findings. IMPRESSION: 1. New bulky left supraclavicular lymphadenopathy and small right supraclavicular adenopathy. 2. New enlarged mediastinal and left hilar lymph nodes. 3. Radiation changes involving the left upper lobe pulmonary nodule which is smaller. No new pulmonary lesions to suggest pulmonary metastatic disease. 4. No findings  for metastatic disease involving the abdomen/pelvis. 5. No obvious osseous metastatic disease. Electronically Signed   By: Marijo Sanes M.D.   On: 10/11/2016 16:44   Dg Abd Acute W/chest  Result Date: 10/16/2016 CLINICAL DATA:  Small bowel obstruction. EXAM: DG ABDOMEN ACUTE W/ 1V CHEST COMPARISON:  CT scans dated 10/15/2016 and 10/11/2016 and chest x-ray dated 11/10/2015 FINDINGS: There is slight dilatation of small bowel loops in the right mid abdomen. Contrast from the prior CT scan has passed into the nondistended colon. No free air. New slight atelectasis and small effusion on the right. Scarring in the left upper lobe as described on the recent CT scan of the chest. IMPRESSION: 1. Slightly dilated small bowel loops in the right mid abdomen. This could represent a partial small bowel obstruction. However, contrast has passed into the nondistended colon. 2. Slight atelectasis and effusion at the right base. 3. Scarring in the left upper lobe as previously described on CT scan of 10/11/2016. Electronically Signed   By: Lorriane Shire M.D.   On: 10/16/2016 10:29    Impression/Plan: 1. Extensive stage small cell carcinoma of the left upper lobe with metastatic disease to the brain.  The patient appears to be doing well following SRS  treatment. We discussed the rationale for following her closely with MRI every 3 months. She will be due for this in September. We discussed steroid taper, and have recommended that she continue with the guidelines given by medical oncology. She will also continue to follow up with medical oncology, and has an appointment with Dr. Julien Nordmann next week. 2. Dermatologic issues in the gluteal cleft. The patient clearly has wounds in this area and it is difficult to understand the process that cause these to appear. Patient does not have a history to suspect that these are related to any sexually transmitted infections or decubitus type insult. I will refer her for wound care  assessment to see their perspective and workup of this. We appreciate their evaluation, and would also consider surgical evaluation if they feel that this is appropriate.     Carola Rhine, PAC

## 2016-11-08 NOTE — Addendum Note (Signed)
Encounter addended by: Malena Edman, RN on: 11/08/2016  1:08 PM<BR>    Actions taken: Charge Capture section accepted

## 2016-11-11 ENCOUNTER — Telehealth: Payer: Self-pay | Admitting: *Deleted

## 2016-11-11 NOTE — Telephone Encounter (Signed)
CALLED PATIENT TO INFORM OF WOUND CARE CENTER APPT. ON  11-23-16 - ARRIVAL TIME - 8:30 AM , ADDRESS - [address]LO[address], [address] - [phone], LVM FOR A RETURN CALL

## 2016-11-12 ENCOUNTER — Other Ambulatory Visit (HOSPITAL_BASED_OUTPATIENT_CLINIC_OR_DEPARTMENT_OTHER): Payer: 59

## 2016-11-12 DIAGNOSIS — C3412 Malignant neoplasm of upper lobe, left bronchus or lung: Secondary | ICD-10-CM | POA: Diagnosis not present

## 2016-11-12 LAB — COMPREHENSIVE METABOLIC PANEL
ALBUMIN: 2.4 g/dL — AB (ref 3.5–5.0)
ALK PHOS: 103 U/L (ref 40–150)
ALT: 41 U/L (ref 0–55)
ANION GAP: 13 meq/L — AB (ref 3–11)
AST: 17 U/L (ref 5–34)
BILIRUBIN TOTAL: 0.44 mg/dL (ref 0.20–1.20)
BUN: 12.5 mg/dL (ref 7.0–26.0)
CO2: 23 mEq/L (ref 22–29)
Calcium: 9.6 mg/dL (ref 8.4–10.4)
Chloride: 100 mEq/L (ref 98–109)
Creatinine: 0.6 mg/dL (ref 0.6–1.1)
GLUCOSE: 107 mg/dL (ref 70–140)
Potassium: 3.9 mEq/L (ref 3.5–5.1)
Sodium: 135 mEq/L — ABNORMAL LOW (ref 136–145)
TOTAL PROTEIN: 6.3 g/dL — AB (ref 6.4–8.3)

## 2016-11-12 LAB — CBC WITH DIFFERENTIAL/PLATELET
BASO%: 0.4 % (ref 0.0–2.0)
Basophils Absolute: 0 10*3/uL (ref 0.0–0.1)
EOS ABS: 0.1 10*3/uL (ref 0.0–0.5)
EOS%: 1 % (ref 0.0–7.0)
HEMATOCRIT: 35.6 % (ref 34.8–46.6)
HEMOGLOBIN: 12 g/dL (ref 11.6–15.9)
LYMPH%: 13.6 % — ABNORMAL LOW (ref 14.0–49.7)
MCH: 32.5 pg (ref 25.1–34.0)
MCHC: 33.7 g/dL (ref 31.5–36.0)
MCV: 96.5 fL (ref 79.5–101.0)
MONO#: 0.9 10*3/uL (ref 0.1–0.9)
MONO%: 13.3 % (ref 0.0–14.0)
NEUT%: 71.7 % (ref 38.4–76.8)
NEUTROS ABS: 4.9 10*3/uL (ref 1.5–6.5)
PLATELETS: 83 10*3/uL — AB (ref 145–400)
RBC: 3.69 10*6/uL — ABNORMAL LOW (ref 3.70–5.45)
RDW: 14.4 % (ref 11.2–14.5)
WBC: 6.8 10*3/uL (ref 3.9–10.3)
lymph#: 0.9 10*3/uL (ref 0.9–3.3)

## 2016-11-12 LAB — MAGNESIUM: MAGNESIUM: 1.6 mg/dL (ref 1.5–2.5)

## 2016-11-15 ENCOUNTER — Telehealth: Payer: Self-pay | Admitting: *Deleted

## 2016-11-15 NOTE — Telephone Encounter (Signed)
"  was trying to talk with my son but could not find my words.  tried for thirty minutes to an hour and couldn't.    What do I need to do about this?  This is the first time I felt like the elevator was not going up to the top floor.  Now I'm able to have a conversation.  I'm tapering off my seizure medication.  I'm on heparin and dexamethasone.   I was told to take seven days of medicine."  Denies slurred speech.  Reportedly son has left.  Unsuccessful with clarification of home medication use.  Instructed to go to ED.   "No, I'm fine now.  I have an eye appointment at 3:30.  Have got to get my eyes checked, I can't half see."    Reschedule eye exam for further evaluation.  speech problem could be a warning sign of something to come.  "I'm not going to the ED until after my eye exam."    Cell number is [phone].

## 2016-11-16 ENCOUNTER — Telehealth: Payer: Self-pay | Admitting: *Deleted

## 2016-11-16 NOTE — Telephone Encounter (Signed)
Received call from pt asking about taking melatonin.  Reviewed meds & she has been on decadron.  She finished yesterday.  Informed that she could try melatonin & to discuss with her pharmacist for best dose.  Informed that sleep problems may be better soon since she if off the decadron.

## 2016-11-18 ENCOUNTER — Ambulatory Visit: Payer: Self-pay | Admitting: Radiation Oncology

## 2016-11-19 ENCOUNTER — Ambulatory Visit (HOSPITAL_BASED_OUTPATIENT_CLINIC_OR_DEPARTMENT_OTHER): Payer: 59

## 2016-11-19 ENCOUNTER — Ambulatory Visit (HOSPITAL_BASED_OUTPATIENT_CLINIC_OR_DEPARTMENT_OTHER): Payer: 59 | Admitting: Nurse Practitioner

## 2016-11-19 ENCOUNTER — Other Ambulatory Visit (HOSPITAL_BASED_OUTPATIENT_CLINIC_OR_DEPARTMENT_OTHER): Payer: 59

## 2016-11-19 ENCOUNTER — Other Ambulatory Visit: Payer: Self-pay | Admitting: Oncology

## 2016-11-19 VITALS — BP 146/82 | HR 108 | Temp 98.7°F | Resp 18 | Ht 66.0 in | Wt 223.8 lb

## 2016-11-19 DIAGNOSIS — C3412 Malignant neoplasm of upper lobe, left bronchus or lung: Secondary | ICD-10-CM

## 2016-11-19 DIAGNOSIS — C3492 Malignant neoplasm of unspecified part of left bronchus or lung: Secondary | ICD-10-CM

## 2016-11-19 DIAGNOSIS — E876 Hypokalemia: Secondary | ICD-10-CM

## 2016-11-19 DIAGNOSIS — Z5111 Encounter for antineoplastic chemotherapy: Secondary | ICD-10-CM

## 2016-11-19 DIAGNOSIS — C7931 Secondary malignant neoplasm of brain: Secondary | ICD-10-CM | POA: Diagnosis not present

## 2016-11-19 DIAGNOSIS — Z923 Personal history of irradiation: Secondary | ICD-10-CM

## 2016-11-19 DIAGNOSIS — B370 Candidal stomatitis: Secondary | ICD-10-CM

## 2016-11-19 DIAGNOSIS — R350 Frequency of micturition: Secondary | ICD-10-CM | POA: Diagnosis not present

## 2016-11-19 LAB — COMPREHENSIVE METABOLIC PANEL
ALT: 36 U/L (ref 0–55)
ANION GAP: 12 meq/L — AB (ref 3–11)
AST: 19 U/L (ref 5–34)
Albumin: 2.2 g/dL — ABNORMAL LOW (ref 3.5–5.0)
Alkaline Phosphatase: 97 U/L (ref 40–150)
BUN: 7.9 mg/dL (ref 7.0–26.0)
CHLORIDE: 101 meq/L (ref 98–109)
CO2: 24 meq/L (ref 22–29)
CREATININE: 0.6 mg/dL (ref 0.6–1.1)
Calcium: 9.1 mg/dL (ref 8.4–10.4)
EGFR: >90 mL/min/{1.73_m2} (ref >90)
Glucose: 117 mg/dl (ref 70–140)
POTASSIUM: 3 meq/L — AB (ref 3.5–5.1)
Sodium: 137 mEq/L (ref 136–145)
Total Bilirubin: 0.39 mg/dL (ref 0.20–1.20)
Total Protein: 6.1 g/dL — ABNORMAL LOW (ref 6.4–8.3)

## 2016-11-19 LAB — URINALYSIS, MICROSCOPIC - CHCC
BLOOD: NEGATIVE
Bilirubin (Urine): NEGATIVE
Glucose: NEGATIVE mg/dL
KETONES: NEGATIVE mg/dL
Leukocyte Esterase: NEGATIVE
Nitrite: NEGATIVE
PROTEIN: NEGATIVE mg/dL
RBC / HPF: NEGATIVE (ref 0–2)
SPECIFIC GRAVITY, URINE: 1.01 (ref 1.003–1.035)
Urobilinogen, UR: 0.2 mg/dL (ref 0.2–1)
pH: 6 (ref 4.6–8.0)

## 2016-11-19 LAB — CBC WITH DIFFERENTIAL/PLATELET
BASO%: 0.8 % (ref 0.0–2.0)
BASOS ABS: 0.1 10*3/uL (ref 0.0–0.1)
EOS%: 0.2 % (ref 0.0–7.0)
Eosinophils Absolute: 0 10*3/uL (ref 0.0–0.5)
HCT: 31.7 % — ABNORMAL LOW (ref 34.8–46.6)
HGB: 10.7 g/dL — ABNORMAL LOW (ref 11.6–15.9)
LYMPH%: 10.7 % — AB (ref 14.0–49.7)
MCH: 32.5 pg (ref 25.1–34.0)
MCHC: 33.8 g/dL (ref 31.5–36.0)
MCV: 96.3 fL (ref 79.5–101.0)
MONO#: 0.8 10*3/uL (ref 0.1–0.9)
MONO%: 10 % (ref 0.0–14.0)
NEUT#: 6.5 10*3/uL (ref 1.5–6.5)
NEUT%: 78.3 % — AB (ref 38.4–76.8)
PLATELETS: 126 10*3/uL — AB (ref 145–400)
RBC: 3.29 10*6/uL — AB (ref 3.70–5.45)
RDW: 14.7 % — ABNORMAL HIGH (ref 11.2–14.5)
WBC: 8.3 10*3/uL (ref 3.9–10.3)
lymph#: 0.9 10*3/uL (ref 0.9–3.3)

## 2016-11-19 LAB — MAGNESIUM: Magnesium: 1.5 mg/dl (ref 1.5–2.5)

## 2016-11-19 MED ORDER — SODIUM CHLORIDE 0.9 % IV SOLN: 500.0000 mL | INTRAVENOUS | Status: AC

## 2016-11-19 MED ORDER — SODIUM CHLORIDE 0.9 % IV SOLN
30.0000 mg/m2 | Freq: Once | INTRAVENOUS | Status: AC
  Administered 2016-11-19: 68 mg via INTRAVENOUS
  Filled 2016-11-19: qty 68

## 2016-11-19 MED ORDER — SODIUM CHLORIDE 0.9 % IV SOLN
Freq: Once | INTRAVENOUS | Status: AC
  Administered 2016-11-19: 11:00:00 via INTRAVENOUS

## 2016-11-19 MED ORDER — POTASSIUM CHLORIDE CRYS ER 20 MEQ PO TBCR: EXTENDED_RELEASE_TABLET | ORAL | 0 refills | Status: DC

## 2016-11-19 MED ORDER — ATROPINE SULFATE 1 MG/ML IJ SOLN
INTRAMUSCULAR | Status: AC
  Filled 2016-11-19: qty 1

## 2016-11-19 MED ORDER — PALONOSETRON HCL INJECTION 0.25 MG/5ML
INTRAVENOUS | Status: AC
  Filled 2016-11-19: qty 5

## 2016-11-19 MED ORDER — SODIUM CHLORIDE 0.9 % IV SOLN
INTRAVENOUS | Status: DC
  Administered 2016-11-19: 14:00:00 via INTRAVENOUS

## 2016-11-19 MED ORDER — PALONOSETRON HCL INJECTION 0.25 MG/5ML
0.2500 mg | Freq: Once | INTRAVENOUS | Status: AC
  Administered 2016-11-19: 0.25 mg via INTRAVENOUS

## 2016-11-19 MED ORDER — DEXTROSE-NACL 5-0.45 % IV SOLN
Freq: Once | INTRAVENOUS | Status: AC
  Administered 2016-11-19: 11:00:00 via INTRAVENOUS
  Filled 2016-11-19: qty 10

## 2016-11-19 MED ORDER — SODIUM CHLORIDE 0.9 % IV SOLN
Freq: Once | INTRAVENOUS | Status: AC
  Administered 2016-11-19: 14:00:00 via INTRAVENOUS
  Filled 2016-11-19: qty 5

## 2016-11-19 MED ORDER — ATROPINE SULFATE 1 MG/ML IJ SOLN
0.5000 mg | Freq: Once | INTRAMUSCULAR | Status: AC | PRN
  Administered 2016-11-19: 0.5 mg via INTRAVENOUS

## 2016-11-19 MED ORDER — FLUCONAZOLE 100 MG PO TABS: 100.0000 mg | ORAL_TABLET | Freq: Every day | ORAL | 0 refills | Status: DC

## 2016-11-19 MED ORDER — IRINOTECAN HCL CHEMO INJECTION 100 MG/5ML
65.0000 mg/m2 | Freq: Once | INTRAVENOUS | Status: AC
  Administered 2016-11-19: 140 mg via INTRAVENOUS
  Filled 2016-11-19: qty 5

## 2016-11-19 NOTE — Patient Instructions (Signed)
[facility] Discharge Instructions for Patients Receiving Chemotherapy  Today you received the following chemotherapy agents: Cisplatin and Irinotecan.   To help prevent nausea and vomiting after your treatment, we encourage you to take your nausea medication as prescribed.    If you develop nausea and vomiting that is not controlled by your nausea medication, call the clinic.   BELOW ARE SYMPTOMS THAT SHOULD BE REPORTED IMMEDIATELY:  *FEVER GREATER THAN 100.5 F  *CHILLS WITH OR WITHOUT FEVER  NAUSEA AND VOMITING THAT IS NOT CONTROLLED WITH YOUR NAUSEA MEDICATION  *UNUSUAL SHORTNESS OF BREATH  *UNUSUAL BRUISING OR BLEEDING  TENDERNESS IN MOUTH AND THROAT WITH OR WITHOUT PRESENCE OF ULCERS  *URINARY PROBLEMS  *BOWEL PROBLEMS  UNUSUAL RASH Items with * indicate a potential emergency and should be followed up as soon as possible.  Feel free to call the clinic you have any questions or concerns. The clinic phone number is [phone].  Please show the [facility] at check-in to the Emergency Department and triage nurse.

## 2016-11-19 NOTE — Progress Notes (Signed)
[facility] OFFICE PROGRESS NOTE   DIAGNOSIS: Extensive stage (T2b, N2, M1a) small cell lung cancer presented with large left upper lobe lung mass and mediastinal lymphadenopathy as well as malignant left pleural effusion diagnosed in July 2017.  PRIOR THERAPY:  1) Systemic chemotherapy with cisplatin 60 MG/M2 on day 1 and etoposide at 120 MG/M2 on days 1, 2 and 3 with Neulasta support on day 4. She is status post 6 cycles. 2) prophylactic cranial irradiation under the care of Dr. Sondra Come completed on 05/18/2016. 3) palliative radiotherapy to the enlarging left upper lobe lung nodule under the care of Dr. Sondra Come. 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  CURRENT THERAPY: Systemic chemotherapy with cisplatin 30 MG/M2 and irinotecan 65 MG/M2 on days 1 and 8 every 3 weeks. First dose 10/21/2016.    INTERVAL HISTORY:   Ms. Amorin returns as scheduled. She completed cycle 1 day 1 cisplatin/irinotecan 10/29/2016. The day 8 treatment was held on 11/05/2016 due to neutropenia. She denies significant nausea/vomiting following the most recent chemotherapy. No mouth sores. She has intermittent loose stools. She reports the "sores" on her buttocks are improving. She has appointment at the wound clinic next week. She complains of urinary frequency and wonders if she may have a urinary tract infection. Her mouth feels "funny". She reports a good appetite overall. She eats 6 meals a day. No shortness of breath. She denies fever.  Objective:  Vital signs in last 24 hours:  Blood pressure (!) 146/82, pulse (!) 108, temperature 98.7 F (37.1 C), temperature source Oral, resp. rate 18, height 5\' 6"  (1.676 m), weight 223 lb 12.8 oz (101.5 kg), SpO2 97 %.    HEENT: Buccal thrush. No ulcerations. Resp: Lungs clear bilaterally. Cardio: Regular rate and rhythm. GI: Abdomen soft and nontender. No hepatosplenomegaly. Vascular: Pitting edema at the lower legs  bilaterally. Skin: Multiple areas of skin breakdown with superficial ulceration at the sacrum/upper gluteal cleft/buttocks. The ulcerations are not erythematous. There is no surrounding erythema. The lesions do not have a vesicular appearance.   Lab Results:  Lab Results  Component Value Date   WBC 8.3 11/19/2016   HGB 10.7 (L) 11/19/2016   HCT 31.7 (L) 11/19/2016   MCV 96.3 11/19/2016   PLT 126 (L) 11/19/2016   NEUTROABS 6.5 11/19/2016    Imaging:  No results found.  Medications: I have reviewed the patient's current medications.  Assessment/Plan: 1. Extensive stage small cell lung cancer status post 6 cycles of systemic chemotherapy with cisplatin and etoposide followed by prophylactic cranial irradiation followed by palliative radiotherapy to a left upper lobe pulmonary nodule followed by stereotactic radiotherapy to solitary brain metastasis. Restaging CT evaluation 10/11/2016 showed evidence of disease progression. She began cisplatin/irinotecan on a day one/day 8 schedule 10/29/2016. The day 8 treatment was held due to neutropenia. 2. Ulcerations at the gluteal cleft. She has been referred to the wound clinic.   Disposition: Ms. Milewski appears stable. She completed cycle 1 day 1 cisplatin/irinotecan 10/29/2016. The day 8 treatment was held due to neutropenia. I reviewed today's labs with Dr. Alen Blew. Plan to proceed with cycle 2 day 1 cisplatin/irinotecan today as scheduled.  She has oral candidiasis. She will complete a course of Diflucan.  We are obtaining a urinalysis to evaluate complaints of urinary frequency.  With regard to the ulcerations at the gluteal cleft she had previously been referred to the wound clinic. She has an appointment there next week. The ulcerations do not appear infected.  She has hypokalemia on labs today. She will begin K Dur 20 mEq twice daily for 3 days and then once daily. We will repeat a basic metabolic panel when she returns next week.  She  will return for cycle 2 day 8 in 1 week. She will be seen in follow-up in 3 weeks. She will contact the office in the interim with any problems.  25 minutes were spent face-to-face at today's visit with the majority of that time involved in counseling/coordination of care.      Ned Card ANP/GNP-BC   11/19/2016  10:21 AM

## 2016-11-20 LAB — URINE CULTURE

## 2016-11-22 ENCOUNTER — Other Ambulatory Visit: Payer: Self-pay | Admitting: Medical Oncology

## 2016-11-22 ENCOUNTER — Telehealth: Payer: Self-pay | Admitting: Medical Oncology

## 2016-11-22 DIAGNOSIS — K3189 Other diseases of stomach and duodenum: Secondary | ICD-10-CM

## 2016-11-22 MED ORDER — FAMOTIDINE 20 MG PO TABS: 20.0000 mg | ORAL_TABLET | Freq: Every day | ORAL | 0 refills | Status: DC

## 2016-11-22 NOTE — Telephone Encounter (Signed)
I was unable to reach pt on her phone so I contacted her son and told him to pick up rx for pepcid to take prior to [facility]. I also  told him for pt to decrease water intake prior to sleep to see if that helps with frequent nighttime voiding.

## 2016-11-22 NOTE — Telephone Encounter (Signed)
1.Frequent urination but no other symptoms of UTI. Is up every hour at night to void and today she has urinated 4 times in one hour today  She feels like she is emptying her bladder and reports her urine is not dark.   2. potassium irritates her stomach. Is there anything she can take so she can tolerated the potassium?

## 2016-11-23 ENCOUNTER — Encounter (HOSPITAL_BASED_OUTPATIENT_CLINIC_OR_DEPARTMENT_OTHER): Payer: 59 | Attending: Surgery

## 2016-11-23 DIAGNOSIS — Z7951 Long term (current) use of inhaled steroids: Secondary | ICD-10-CM | POA: Diagnosis not present

## 2016-11-23 DIAGNOSIS — C7931 Secondary malignant neoplasm of brain: Secondary | ICD-10-CM | POA: Diagnosis not present

## 2016-11-23 DIAGNOSIS — Z79899 Other long term (current) drug therapy: Secondary | ICD-10-CM | POA: Insufficient documentation

## 2016-11-23 DIAGNOSIS — J449 Chronic obstructive pulmonary disease, unspecified: Secondary | ICD-10-CM | POA: Insufficient documentation

## 2016-11-23 DIAGNOSIS — L89323 Pressure ulcer of left buttock, stage 3: Secondary | ICD-10-CM | POA: Diagnosis present

## 2016-11-23 DIAGNOSIS — Z853 Personal history of malignant neoplasm of breast: Secondary | ICD-10-CM | POA: Insufficient documentation

## 2016-11-23 DIAGNOSIS — Z87891 Personal history of nicotine dependence: Secondary | ICD-10-CM | POA: Insufficient documentation

## 2016-11-23 DIAGNOSIS — C3490 Malignant neoplasm of unspecified part of unspecified bronchus or lung: Secondary | ICD-10-CM | POA: Insufficient documentation

## 2016-11-26 ENCOUNTER — Other Ambulatory Visit (HOSPITAL_BASED_OUTPATIENT_CLINIC_OR_DEPARTMENT_OTHER): Payer: 59

## 2016-11-26 ENCOUNTER — Telehealth: Payer: Self-pay

## 2016-11-26 ENCOUNTER — Ambulatory Visit (HOSPITAL_BASED_OUTPATIENT_CLINIC_OR_DEPARTMENT_OTHER): Payer: 59

## 2016-11-26 VITALS — BP 121/86 | HR 99 | Temp 98.6°F | Resp 18

## 2016-11-26 DIAGNOSIS — C3492 Malignant neoplasm of unspecified part of left bronchus or lung: Secondary | ICD-10-CM

## 2016-11-26 DIAGNOSIS — Z5111 Encounter for antineoplastic chemotherapy: Secondary | ICD-10-CM

## 2016-11-26 DIAGNOSIS — C3412 Malignant neoplasm of upper lobe, left bronchus or lung: Secondary | ICD-10-CM

## 2016-11-26 LAB — COMPREHENSIVE METABOLIC PANEL
ALK PHOS: 99 U/L (ref 40–150)
ALT: 32 U/L (ref 0–55)
AST: 31 U/L (ref 5–34)
Albumin: 2.5 g/dL — ABNORMAL LOW (ref 3.5–5.0)
Anion Gap: 13 mEq/L — ABNORMAL HIGH (ref 3–11)
BUN: 8.1 mg/dL (ref 7.0–26.0)
CHLORIDE: 96 meq/L — AB (ref 98–109)
CO2: 25 mEq/L (ref 22–29)
Calcium: 9.8 mg/dL (ref 8.4–10.4)
Creatinine: 0.7 mg/dL (ref 0.6–1.1)
GLUCOSE: 115 mg/dL (ref 70–140)
POTASSIUM: 3.9 meq/L (ref 3.5–5.1)
SODIUM: 134 meq/L — AB (ref 136–145)
Total Bilirubin: 0.46 mg/dL (ref 0.20–1.20)
Total Protein: 6.2 g/dL — ABNORMAL LOW (ref 6.4–8.3)

## 2016-11-26 LAB — MAGNESIUM: Magnesium: 1.7 mg/dl (ref 1.5–2.5)

## 2016-11-26 LAB — CBC WITH DIFFERENTIAL/PLATELET
BASO%: 0.5 % (ref 0.0–2.0)
BASOS ABS: 0 10*3/uL (ref 0.0–0.1)
EOS ABS: 0 10*3/uL (ref 0.0–0.5)
EOS%: 0.3 % (ref 0.0–7.0)
HCT: 32.1 % — ABNORMAL LOW (ref 34.8–46.6)
HGB: 11.1 g/dL — ABNORMAL LOW (ref 11.6–15.9)
LYMPH%: 12.3 % — AB (ref 14.0–49.7)
MCH: 33 pg (ref 25.1–34.0)
MCHC: 34.6 g/dL (ref 31.5–36.0)
MCV: 95.4 fL (ref 79.5–101.0)
MONO#: 0.6 10*3/uL (ref 0.1–0.9)
MONO%: 8.9 % (ref 0.0–14.0)
NEUT#: 4.8 10*3/uL (ref 1.5–6.5)
NEUT%: 78 % — ABNORMAL HIGH (ref 38.4–76.8)
Platelets: 148 10*3/uL (ref 145–400)
RBC: 3.36 10*6/uL — AB (ref 3.70–5.45)
RDW: 14.2 % (ref 11.2–14.5)
WBC: 6.2 10*3/uL (ref 3.9–10.3)
lymph#: 0.8 10*3/uL — ABNORMAL LOW (ref 0.9–3.3)

## 2016-11-26 MED ORDER — IRINOTECAN HCL CHEMO INJECTION 100 MG/5ML
65.0000 mg/m2 | Freq: Once | INTRAVENOUS | Status: AC
  Administered 2016-11-26: 140 mg via INTRAVENOUS
  Filled 2016-11-26: qty 5

## 2016-11-26 MED ORDER — SODIUM CHLORIDE 0.9 % IV SOLN
Freq: Once | INTRAVENOUS | Status: AC
  Administered 2016-11-26: 12:00:00 via INTRAVENOUS
  Filled 2016-11-26: qty 5

## 2016-11-26 MED ORDER — ATROPINE SULFATE 1 MG/ML IJ SOLN
0.5000 mg | Freq: Once | INTRAMUSCULAR | Status: AC | PRN
  Administered 2016-11-26: 0.5 mg via INTRAVENOUS

## 2016-11-26 MED ORDER — SODIUM CHLORIDE 0.9 % IV SOLN
30.0000 mg/m2 | Freq: Once | INTRAVENOUS | Status: AC
  Administered 2016-11-26: 68 mg via INTRAVENOUS
  Filled 2016-11-26: qty 68

## 2016-11-26 MED ORDER — PALONOSETRON HCL INJECTION 0.25 MG/5ML
INTRAVENOUS | Status: AC
  Filled 2016-11-26: qty 5

## 2016-11-26 MED ORDER — ATROPINE SULFATE 1 MG/ML IJ SOLN
INTRAMUSCULAR | Status: AC
  Filled 2016-11-26: qty 1

## 2016-11-26 MED ORDER — POTASSIUM CHLORIDE 2 MEQ/ML IV SOLN
Freq: Once | INTRAVENOUS | Status: AC
  Administered 2016-11-26: 10:00:00 via INTRAVENOUS
  Filled 2016-11-26: qty 10

## 2016-11-26 MED ORDER — PALONOSETRON HCL INJECTION 0.25 MG/5ML
0.2500 mg | Freq: Once | INTRAVENOUS | Status: AC
  Administered 2016-11-26: 0.25 mg via INTRAVENOUS

## 2016-11-26 MED ORDER — SODIUM CHLORIDE 0.9 % IV SOLN
Freq: Once | INTRAVENOUS | Status: AC
  Administered 2016-11-26: 09:00:00 via INTRAVENOUS

## 2016-11-26 NOTE — Telephone Encounter (Signed)
Left a message on voice mail of up coming scheduled appointment on 8/6

## 2016-11-26 NOTE — Patient Instructions (Signed)
[facility] Discharge Instructions for Patients Receiving Chemotherapy  Today you received the following chemotherapy agents: Irinotecan and Cisplatin   To help prevent nausea and vomiting after your treatment, we encourage you to take your nausea medication as directed.    If you develop nausea and vomiting that is not controlled by your nausea medication, call the clinic.   BELOW ARE SYMPTOMS THAT SHOULD BE REPORTED IMMEDIATELY:  *FEVER GREATER THAN 100.5 F  *CHILLS WITH OR WITHOUT FEVER  NAUSEA AND VOMITING THAT IS NOT CONTROLLED WITH YOUR NAUSEA MEDICATION  *UNUSUAL SHORTNESS OF BREATH  *UNUSUAL BRUISING OR BLEEDING  TENDERNESS IN MOUTH AND THROAT WITH OR WITHOUT PRESENCE OF ULCERS  *URINARY PROBLEMS  *BOWEL PROBLEMS  UNUSUAL RASH Items with * indicate a potential emergency and should be followed up as soon as possible.  Feel free to call the clinic you have any questions or concerns. The clinic phone number is [phone].  Please show the [facility] at check-in to the Emergency Department and triage nurse.

## 2016-11-26 NOTE — Progress Notes (Signed)
Per Dr. Alvy Bimler okay to proceed with treatment with 200 ml urine output, pt to drink 6 cups of water during treatment today. Pt aware, water provided.

## 2016-11-29 ENCOUNTER — Ambulatory Visit (HOSPITAL_BASED_OUTPATIENT_CLINIC_OR_DEPARTMENT_OTHER): Payer: 59

## 2016-11-29 VITALS — BP 140/63 | HR 100 | Temp 97.3°F | Resp 20

## 2016-11-29 DIAGNOSIS — C3492 Malignant neoplasm of unspecified part of left bronchus or lung: Secondary | ICD-10-CM

## 2016-11-29 DIAGNOSIS — Z5189 Encounter for other specified aftercare: Secondary | ICD-10-CM

## 2016-11-29 DIAGNOSIS — C3412 Malignant neoplasm of upper lobe, left bronchus or lung: Secondary | ICD-10-CM | POA: Diagnosis not present

## 2016-11-29 MED ORDER — PEGFILGRASTIM INJECTION 6 MG/0.6ML ~~LOC~~
6.0000 mg | PREFILLED_SYRINGE | Freq: Once | SUBCUTANEOUS | Status: AC
  Administered 2016-11-29: 6 mg via SUBCUTANEOUS
  Filled 2016-11-29: qty 0.6

## 2016-11-29 NOTE — Patient Instructions (Signed)
Pegfilgrastim injection What is this medicine? PEGFILGRASTIM (PEG fil gra stim) is a long-acting granulocyte colony-stimulating factor that stimulates the growth of neutrophils, a type of white blood cell important in the body's fight against infection. It is used to reduce the incidence of fever and infection in patients with certain types of cancer who are receiving chemotherapy that affects the bone marrow, and to increase survival after being exposed to high doses of radiation. This medicine may be used for other purposes; ask your health care provider or pharmacist if you have questions. What should I tell my health care provider before I take this medicine? They need to know if you have any of these conditions: -kidney disease -latex allergy -ongoing radiation therapy -sickle cell disease -skin reactions to acrylic adhesives (On-Body Injector only) -an unusual or allergic reaction to pegfilgrastim, filgrastim, other medicines, foods, dyes, or preservatives -pregnant or trying to get pregnant -breast-feeding How should I use this medicine? This medicine is for injection under the skin. If you get this medicine at home, you will be taught how to prepare and give the pre-filled syringe or how to use the On-body Injector. Refer to the patient Instructions for Use for detailed instructions. Use exactly as directed. Take your medicine at regular intervals. Do not take your medicine more often than directed. It is important that you put your used needles and syringes in a special sharps container. Do not put them in a trash can. If you do not have a sharps container, call your pharmacist or healthcare provider to get one. Talk to your pediatrician regarding the use of this medicine in children. While this drug may be prescribed for selected conditions, precautions do apply. Overdosage: If you think you have taken too much of this medicine contact a poison control center or emergency room at  once. NOTE: This medicine is only for you. Do not share this medicine with others. What if I miss a dose? It is important not to miss your dose. Call your doctor or health care professional if you miss your dose. If you miss a dose due to an On-body Injector failure or leakage, a new dose should be administered as soon as possible using a single prefilled syringe for manual use. What may interact with this medicine? Interactions have not been studied. Give your health care provider a list of all the medicines, herbs, non-prescription drugs, or dietary supplements you use. Also tell them if you smoke, drink alcohol, or use illegal drugs. Some items may interact with your medicine. This list may not describe all possible interactions. Give your health care provider a list of all the medicines, herbs, non-prescription drugs, or dietary supplements you use. Also tell them if you smoke, drink alcohol, or use illegal drugs. Some items may interact with your medicine. What should I watch for while using this medicine? You may need blood work done while you are taking this medicine. If you are going to need a MRI, CT scan, or other procedure, tell your doctor that you are using this medicine (On-Body Injector only). What side effects may I notice from receiving this medicine? Side effects that you should report to your doctor or health care professional as soon as possible: -allergic reactions like skin rash, itching or hives, swelling of the face, lips, or tongue -dizziness -fever -pain, redness, or irritation at site where injected -pinpoint red spots on the skin -red or dark-brown urine -shortness of breath or breathing problems -stomach or side pain, or pain   at the shoulder -swelling -tiredness -trouble passing urine or change in the amount of urine Side effects that usually do not require medical attention (report to your doctor or health care professional if they continue or are  bothersome): -bone pain -muscle pain This list may not describe all possible side effects. Call your doctor for medical advice about side effects. You may report side effects to FDA at 1-800-FDA-1088. Where should I keep my medicine? Keep out of the reach of children. Store pre-filled syringes in a refrigerator between 2 and 8 degrees C (36 and 46 degrees F). Do not freeze. Keep in carton to protect from light. Throw away this medicine if it is left out of the refrigerator for more than 48 hours. Throw away any unused medicine after the expiration date. NOTE: This sheet is a summary. It may not cover all possible information. If you have questions about this medicine, talk to your doctor, pharmacist, or health care provider.    2016, [employer]vier/[employer] Standard. (2014-05-02 14:30:14)  

## 2016-12-01 ENCOUNTER — Encounter (HOSPITAL_BASED_OUTPATIENT_CLINIC_OR_DEPARTMENT_OTHER): Payer: 59 | Attending: Surgery

## 2016-12-01 DIAGNOSIS — Z87891 Personal history of nicotine dependence: Secondary | ICD-10-CM | POA: Insufficient documentation

## 2016-12-01 DIAGNOSIS — C3412 Malignant neoplasm of upper lobe, left bronchus or lung: Secondary | ICD-10-CM | POA: Insufficient documentation

## 2016-12-01 DIAGNOSIS — J449 Chronic obstructive pulmonary disease, unspecified: Secondary | ICD-10-CM | POA: Insufficient documentation

## 2016-12-01 DIAGNOSIS — L89323 Pressure ulcer of left buttock, stage 3: Secondary | ICD-10-CM | POA: Diagnosis not present

## 2016-12-01 DIAGNOSIS — C7931 Secondary malignant neoplasm of brain: Secondary | ICD-10-CM | POA: Diagnosis not present

## 2016-12-02 ENCOUNTER — Telehealth: Payer: Self-pay | Admitting: Medical Oncology

## 2016-12-02 NOTE — Telephone Encounter (Signed)
I asked dtr to come to pt next appt next week to get the information she is requesting.SHe said she will be here.

## 2016-12-03 ENCOUNTER — Other Ambulatory Visit (HOSPITAL_BASED_OUTPATIENT_CLINIC_OR_DEPARTMENT_OTHER): Payer: 59

## 2016-12-03 ENCOUNTER — Telehealth: Payer: Self-pay | Admitting: Medical Oncology

## 2016-12-03 ENCOUNTER — Other Ambulatory Visit: Payer: Self-pay | Admitting: Medical Oncology

## 2016-12-03 DIAGNOSIS — R350 Frequency of micturition: Secondary | ICD-10-CM

## 2016-12-03 DIAGNOSIS — C3412 Malignant neoplasm of upper lobe, left bronchus or lung: Secondary | ICD-10-CM

## 2016-12-03 DIAGNOSIS — C3492 Malignant neoplasm of unspecified part of left bronchus or lung: Secondary | ICD-10-CM

## 2016-12-03 LAB — COMPREHENSIVE METABOLIC PANEL
ALT: 25 U/L (ref 0–55)
AST: 15 U/L (ref 5–34)
Albumin: 2.6 g/dL — ABNORMAL LOW (ref 3.5–5.0)
Alkaline Phosphatase: 112 U/L (ref 40–150)
Anion Gap: 14 mEq/L — ABNORMAL HIGH (ref 3–11)
BILIRUBIN TOTAL: 0.73 mg/dL (ref 0.20–1.20)
BUN: 11.4 mg/dL (ref 7.0–26.0)
CHLORIDE: 90 meq/L — AB (ref 98–109)
CO2: 26 meq/L (ref 22–29)
CREATININE: 0.7 mg/dL (ref 0.6–1.1)
Calcium: 9.5 mg/dL (ref 8.4–10.4)
EGFR: >90 mL/min/{1.73_m2} (ref >90)
GLUCOSE: 142 mg/dL — AB (ref 70–140)
Potassium: 2.9 mEq/L — CL (ref 3.5–5.1)
Sodium: 130 mEq/L — ABNORMAL LOW (ref 136–145)
TOTAL PROTEIN: 6.7 g/dL (ref 6.4–8.3)

## 2016-12-03 LAB — CBC WITH DIFFERENTIAL/PLATELET
BASO%: 0.5 % (ref 0.0–2.0)
Basophils Absolute: 0 10*3/uL (ref 0.0–0.1)
EOS%: 1.8 % (ref 0.0–7.0)
Eosinophils Absolute: 0 10*3/uL (ref 0.0–0.5)
HCT: 30.1 % — ABNORMAL LOW (ref 34.8–46.6)
HGB: 10.5 g/dL — ABNORMAL LOW (ref 11.6–15.9)
LYMPH#: 0.6 10*3/uL — AB (ref 0.9–3.3)
LYMPH%: 28.9 % (ref 14.0–49.7)
MCH: 32.5 pg (ref 25.1–34.0)
MCHC: 34.9 g/dL (ref 31.5–36.0)
MCV: 93.2 fL (ref 79.5–101.0)
MONO#: 0.6 10*3/uL (ref 0.1–0.9)
MONO%: 26.6 % — ABNORMAL HIGH (ref 0.0–14.0)
NEUT%: 42.2 % (ref 38.4–76.8)
NEUTROS ABS: 0.9 10*3/uL — AB (ref 1.5–6.5)
PLATELETS: 119 10*3/uL — AB (ref 145–400)
RBC: 3.23 10*6/uL — AB (ref 3.70–5.45)
RDW: 14 % (ref 11.2–14.5)
WBC: 2.2 10*3/uL — AB (ref 3.9–10.3)

## 2016-12-03 LAB — MAGNESIUM: MAGNESIUM: 1.5 mg/dL (ref 1.5–2.5)

## 2016-12-03 MED ORDER — POTASSIUM CHLORIDE CRYS ER 20 MEQ PO TBCR: EXTENDED_RELEASE_TABLET | ORAL | 0 refills | Status: DC

## 2016-12-03 NOTE — Telephone Encounter (Signed)
I left message regarding neutrapenic precautions and low K+ and asked her to return call.

## 2016-12-03 NOTE — Telephone Encounter (Addendum)
Per Julien Nordmann kdur ordered for 20 meq bid, po x 10 days and rx sent to pharmacy.I left message on pt , her son and daughters VM  for pt to pick up rx for kdur today and start it.

## 2016-12-03 NOTE — Telephone Encounter (Signed)
Pt returned call. She needs K+ refill, she was out yesterday. How many does Dr Julien Nordmann want her to take per day? What quantity? She thought [facility] Therapist, sports had mentioned two medications. S/w her about neutropenic precautions.

## 2016-12-06 ENCOUNTER — Telehealth: Payer: Self-pay | Admitting: Oncology

## 2016-12-06 NOTE — Telephone Encounter (Signed)
[phone] [facility] [employer]   Has hs been trying to contact patient and has been unsuccessful

## 2016-12-08 ENCOUNTER — Telehealth: Payer: Self-pay | Admitting: Internal Medicine

## 2016-12-08 ENCOUNTER — Ambulatory Visit (HOSPITAL_BASED_OUTPATIENT_CLINIC_OR_DEPARTMENT_OTHER): Payer: 59 | Admitting: Nurse Practitioner

## 2016-12-08 ENCOUNTER — Telehealth: Payer: Self-pay | Admitting: Emergency Medicine

## 2016-12-08 ENCOUNTER — Other Ambulatory Visit (HOSPITAL_BASED_OUTPATIENT_CLINIC_OR_DEPARTMENT_OTHER): Payer: 59

## 2016-12-08 VITALS — BP 104/72 | HR 108 | Temp 98.6°F | Resp 18 | Ht 66.0 in | Wt 212.6 lb

## 2016-12-08 DIAGNOSIS — C3412 Malignant neoplasm of upper lobe, left bronchus or lung: Secondary | ICD-10-CM

## 2016-12-08 DIAGNOSIS — C7931 Secondary malignant neoplasm of brain: Secondary | ICD-10-CM | POA: Diagnosis not present

## 2016-12-08 DIAGNOSIS — C3492 Malignant neoplasm of unspecified part of left bronchus or lung: Secondary | ICD-10-CM

## 2016-12-08 DIAGNOSIS — E876 Hypokalemia: Secondary | ICD-10-CM | POA: Diagnosis not present

## 2016-12-08 LAB — CBC WITH DIFFERENTIAL/PLATELET
BASO%: 1.5 % (ref 0.0–2.0)
Basophils Absolute: 0.1 10*3/uL (ref 0.0–0.1)
EOS%: 1.3 % (ref 0.0–7.0)
Eosinophils Absolute: 0 10*3/uL (ref 0.0–0.5)
HEMATOCRIT: 28.5 % — AB (ref 34.8–46.6)
HEMOGLOBIN: 9.9 g/dL — AB (ref 11.6–15.9)
LYMPH#: 0.7 10*3/uL — AB (ref 0.9–3.3)
LYMPH%: 18.5 % (ref 14.0–49.7)
MCH: 32.8 pg (ref 25.1–34.0)
MCHC: 34.6 g/dL (ref 31.5–36.0)
MCV: 94.7 fL (ref 79.5–101.0)
MONO#: 0.8 10*3/uL (ref 0.1–0.9)
MONO%: 23.4 % — ABNORMAL HIGH (ref 0.0–14.0)
NEUT%: 55.3 % (ref 38.4–76.8)
NEUTROS ABS: 2 10*3/uL (ref 1.5–6.5)
PLATELETS: 99 10*3/uL — AB (ref 145–400)
RBC: 3.01 10*6/uL — ABNORMAL LOW (ref 3.70–5.45)
RDW: 15 % — AB (ref 11.2–14.5)
WBC: 3.5 10*3/uL — AB (ref 3.9–10.3)

## 2016-12-08 LAB — COMPREHENSIVE METABOLIC PANEL
ALBUMIN: 2.4 g/dL — AB (ref 3.5–5.0)
ALT: 29 U/L (ref 0–55)
AST: 20 U/L (ref 5–34)
Alkaline Phosphatase: 110 U/L (ref 40–150)
Anion Gap: 14 mEq/L — ABNORMAL HIGH (ref 3–11)
BUN: 13.9 mg/dL (ref 7.0–26.0)
CALCIUM: 9.3 mg/dL (ref 8.4–10.4)
CHLORIDE: 94 meq/L — AB (ref 98–109)
CO2: 20 mEq/L — ABNORMAL LOW (ref 22–29)
Creatinine: 1 mg/dL (ref 0.6–1.1)
EGFR: 65 mL/min/{1.73_m2} — AB (ref >90)
Glucose: 122 mg/dl (ref 70–140)
POTASSIUM: 3.3 meq/L — AB (ref 3.5–5.1)
Sodium: 128 mEq/L — ABNORMAL LOW (ref 136–145)
TOTAL PROTEIN: 6.2 g/dL — AB (ref 6.4–8.3)
Total Bilirubin: 0.54 mg/dL (ref 0.20–1.20)

## 2016-12-08 LAB — MAGNESIUM: MAGNESIUM: 1 mg/dL — AB (ref 1.5–2.5)

## 2016-12-08 MED ORDER — MAGNESIUM OXIDE 400 (241.3 MG) MG PO TABS: 400.0000 mg | ORAL_TABLET | Freq: Three times a day (TID) | ORAL | 0 refills | Status: DC

## 2016-12-08 NOTE — Telephone Encounter (Signed)
Gave pt avs and calendar for upcoming appts.

## 2016-12-08 NOTE — Progress Notes (Addendum)
[facility] OFFICE PROGRESS NOTE   DIAGNOSIS:Extensive stage (T2b, N2, M1a) small cell lung cancer presented with large left upper lobe lung mass Sharon mediastinal lymphadenopathy as well as malignant left pleural effusion diagnosed in July 2017.  PRIOR THERAPY:  1) Systemic chemotherapy with cisplatin 60 MG/M2 on day 1 Sharon etoposide at 120 MG/M2 on days 1, 2 Sharon 3 with Neulasta support on day 4. She is status post 6 cycles. 2) prophylactic cranial irradiation under Sharon care of Dr. Sondra Come completed on 05/18/2016. 3) palliative radiotherapy to Sharon enlarging left upper lobe lung nodule under Sharon care of Dr. Sondra Come. 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  CURRENT THERAPY: Systemic chemotherapy with cisplatin 30 MG/M2 Sharon irinotecan 65 MG/M2 on days 1 Sharon 8 every 3 weeks.First dose 10/21/2016.    INTERVAL HISTORY:   Sharon [name] returns as scheduled. She completed cycle 2 cisplatin/irinotecan 11/19/2016, 11/26/2016. No change in baseline nausea. She has intermittent loose stools. She denies shortness of breath. She reports improvement in Sharon appetite. She is now being followed at Sharon wound clinic for Sharon sacral ulcerations.  Objective:  Vital signs in last 24 hours:  Blood pressure 104/72, pulse (!) 108, temperature 98.6 F (37 C), temperature source Oral, resp. rate 18, height 5\' 6"  (1.676 m), weight 212 lb 9.6 oz (96.4 kg).    HEENT: No thrush or ulcers. Resp: Lungs clear bilaterally. Cardio: Regular rate Sharon rhythm. GI: Abdomen soft Sharon nontender. No hepatosplenomegaly. Vascular: No leg edema. Skin: Superficial ulceration at Sharon sacrum/upper gluteal cleft without surrounding erythema.    Lab Results:  Lab Results  Component Value Date   WBC 3.5 (L) 12/08/2016   HGB 9.9 (L) 12/08/2016   HCT 28.5 (L) 12/08/2016   MCV 94.7 12/08/2016   PLT 99 (L) 12/08/2016   NEUTROABS 2.0 12/08/2016    Imaging:  No results  found.  Medications: I have reviewed Sharon [name]'s current medications.  Assessment/Plan: 1. Extensive stage small cell lung cancer status post 6 cycles of systemic chemotherapy with cisplatin Sharon etoposide followed by prophylactic cranial irradiation followed by palliative radiotherapy to a left upper lobe pulmonary nodule followed by stereotactic radiotherapy to solitary brain metastasis. Restaging CT evaluation 10/11/2016 showed evidence of disease progression. She began cisplatin/irinotecan on a day one/day 8 schedule 10/29/2016. Sharon day 8 treatment was held due to neutropenia. Cycle 2 cisplatin/irinotecan 11/19/2016, 11/26/2016. 2. Ulcerations at Sharon sacrum/gluteal cleft. She has been referred to Sharon wound clinic. 3. Hypokalemia Sharon hypomagnesemia. Currently on a potassium supplement, magnesium added.   Disposition: Sharon [name] appears unchanged. She has completed 2 cycles of cisplatin/irinotecan. Sharon plan is to proceed with cycle 3 day 1 tomorrow as scheduled. She will return for Sharon day 8 treatment on 12/17/2016. Dr. Julien Nordmann recommends restaging CTs a few days prior to Sharon next visit in 3 weeks.  She will continue follow-up at Sharon wound clinic for Sharon sacral ulceration.  She has hypokalemia Sharon hypomagnesemia. She will continue Sharon potassium supplement Sharon begin magnesium oxide 400 mg 3 times daily. Labs will be repeated next week.  She will return for a follow-up visit with Dr. Julien Nordmann on 12/29/2016. She will contact Sharon office in Sharon interim with any problems.  [name] seen with Dr. Julien Nordmann.  Ned Card ANP/GNP-BC   12/08/2016  10:34 AM  ADDENDUM: Hematology/Oncology Attending: I had a face to face encounter with Sharon [name]. I recommended Sharon care plan. She came to Sharon clinic today for follow-up visit accompanied by  Sharon [name] Sharon [name]. 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 She is currently undergoing systemic chemotherapy with cisplatin Sharon irinotecan status post 2 cycles Sharon has been tolerating Sharon treatment well except for electrolyte imbalances with hypomagnesemia Sharon hypokalemia. I had a lengthy discussion with Sharon [name] Sharon Sharon family about Sharon current disease status, prognosis Sharon goals of care. Sharon [name] Sharon Sharon family understand Sharon poor prognosis Sharon Sharon treatment is only off palliative nature. She would like to continue with Sharon current treatment Sharon she will receive cycle #3 tomorrow. I will arrange for Sharon [name] to have repeat CT scan of Sharon chest, abdomen Sharon pelvis in 3 weeks before starting cycle #4. For Sharon hypomagnesemia Sharon hypokalemia, we will provide Sharon [name] with electrolyte supplements. Sharon [name] would come back for follow-up visit in 3 weeks. Sharon [name] also requested a referral to palliative care to help Sharon [name] at home. We will arrange for this referral. She was advised to call immediately if she has any concerning symptoms in Sharon interval. Disclaimer: This note was dictated with voice recognition software. Similar sounding words can inadvertently be transcribed Sharon may be missed upon review. [name], MD 12/10/16

## 2016-12-08 NOTE — Telephone Encounter (Signed)
palliative care of [facility] contacted by this nurse to verify referral being received. It was verbalized to this nurse that referral was successfully faxed over.

## 2016-12-09 ENCOUNTER — Ambulatory Visit (HOSPITAL_BASED_OUTPATIENT_CLINIC_OR_DEPARTMENT_OTHER): Payer: 59

## 2016-12-09 ENCOUNTER — Other Ambulatory Visit: Payer: Self-pay | Admitting: Internal Medicine

## 2016-12-09 ENCOUNTER — Other Ambulatory Visit: Payer: 59

## 2016-12-09 ENCOUNTER — Ambulatory Visit: Payer: 59 | Admitting: Nurse Practitioner

## 2016-12-09 VITALS — BP 119/63 | HR 107 | Temp 98.0°F | Resp 17

## 2016-12-09 DIAGNOSIS — Z5111 Encounter for antineoplastic chemotherapy: Secondary | ICD-10-CM | POA: Diagnosis not present

## 2016-12-09 DIAGNOSIS — C3492 Malignant neoplasm of unspecified part of left bronchus or lung: Secondary | ICD-10-CM

## 2016-12-09 DIAGNOSIS — C3412 Malignant neoplasm of upper lobe, left bronchus or lung: Secondary | ICD-10-CM | POA: Diagnosis not present

## 2016-12-09 MED ORDER — PALONOSETRON HCL INJECTION 0.25 MG/5ML
0.2500 mg | Freq: Once | INTRAVENOUS | Status: AC
  Administered 2016-12-09: 0.25 mg via INTRAVENOUS

## 2016-12-09 MED ORDER — ATROPINE SULFATE 1 MG/ML IJ SOLN
INTRAMUSCULAR | Status: AC
  Filled 2016-12-09: qty 1

## 2016-12-09 MED ORDER — IRINOTECAN HCL CHEMO INJECTION 100 MG/5ML
65.0000 mg/m2 | Freq: Once | INTRAVENOUS | Status: AC
  Administered 2016-12-09: 140 mg via INTRAVENOUS
  Filled 2016-12-09: qty 5

## 2016-12-09 MED ORDER — SODIUM CHLORIDE 0.9 % IV SOLN
Freq: Once | INTRAVENOUS | Status: AC
  Administered 2016-12-09: 10:00:00 via INTRAVENOUS

## 2016-12-09 MED ORDER — SODIUM CHLORIDE 0.9 % IV SOLN
Freq: Once | INTRAVENOUS | Status: AC
  Administered 2016-12-09: 13:00:00 via INTRAVENOUS
  Filled 2016-12-09: qty 5

## 2016-12-09 MED ORDER — SODIUM CHLORIDE 0.9 % IV SOLN
30.0000 mg/m2 | Freq: Once | INTRAVENOUS | Status: AC
  Administered 2016-12-09: 68 mg via INTRAVENOUS
  Filled 2016-12-09: qty 68

## 2016-12-09 MED ORDER — POTASSIUM CHLORIDE 2 MEQ/ML IV SOLN
Freq: Once | INTRAVENOUS | Status: AC
  Administered 2016-12-09: 10:00:00 via INTRAVENOUS
  Filled 2016-12-09: qty 10

## 2016-12-09 MED ORDER — ATROPINE SULFATE 1 MG/ML IJ SOLN
0.5000 mg | Freq: Once | INTRAMUSCULAR | Status: AC | PRN
  Administered 2016-12-09: 0.5 mg via INTRAVENOUS

## 2016-12-09 MED ORDER — PALONOSETRON HCL INJECTION 0.25 MG/5ML
INTRAVENOUS | Status: AC
  Filled 2016-12-09: qty 5

## 2016-12-09 NOTE — Patient Instructions (Addendum)
[facility] Discharge Instructions for Patients Receiving Chemotherapy  Today you received the following chemotherapy agents:  Cisplatin, Irinotecan  To help prevent nausea and vomiting after your treatment, we encourage you to take your nausea medication as prescribed.   If you develop nausea and vomiting that is not controlled by your nausea medication, call the clinic.   BELOW ARE SYMPTOMS THAT SHOULD BE REPORTED IMMEDIATELY:  *FEVER GREATER THAN 100.5 F  *CHILLS WITH OR WITHOUT FEVER  NAUSEA AND VOMITING THAT IS NOT CONTROLLED WITH YOUR NAUSEA MEDICATION  *UNUSUAL SHORTNESS OF BREATH  *UNUSUAL BRUISING OR BLEEDING  TENDERNESS IN MOUTH AND THROAT WITH OR WITHOUT PRESENCE OF ULCERS  *URINARY PROBLEMS  *BOWEL PROBLEMS  UNUSUAL RASH Items with * indicate a potential emergency and should be followed up as soon as possible.  Feel free to call the clinic you have any questions or concerns. The clinic phone number is [phone].  Please show the [facility] at check-in to the Emergency Department and triage nurse.

## 2016-12-09 NOTE — Progress Notes (Signed)
Per Dr Julien Nordmann OK to proceed with treatment today with PLT of 99. Per Dr Julien Nordmann OK to continue treatment regardless of no urine output thus far.

## 2016-12-10 ENCOUNTER — Encounter: Payer: Self-pay | Admitting: Nurse Practitioner

## 2016-12-14 DIAGNOSIS — L89323 Pressure ulcer of left buttock, stage 3: Secondary | ICD-10-CM | POA: Diagnosis not present

## 2016-12-16 ENCOUNTER — Telehealth: Payer: Self-pay | Admitting: Internal Medicine

## 2016-12-16 NOTE — Telephone Encounter (Signed)
Left msg for patient regarding their appt on 8/27.

## 2016-12-17 ENCOUNTER — Ambulatory Visit: Payer: 59

## 2016-12-17 ENCOUNTER — Other Ambulatory Visit: Payer: Self-pay | Admitting: Internal Medicine

## 2016-12-17 ENCOUNTER — Other Ambulatory Visit: Payer: Self-pay | Admitting: Medical Oncology

## 2016-12-17 ENCOUNTER — Ambulatory Visit (HOSPITAL_BASED_OUTPATIENT_CLINIC_OR_DEPARTMENT_OTHER): Payer: 59

## 2016-12-17 ENCOUNTER — Encounter: Payer: Self-pay | Admitting: Internal Medicine

## 2016-12-17 ENCOUNTER — Other Ambulatory Visit: Payer: Self-pay

## 2016-12-17 ENCOUNTER — Ambulatory Visit: Payer: 59 | Admitting: Nutrition

## 2016-12-17 ENCOUNTER — Ambulatory Visit (HOSPITAL_COMMUNITY)
Admission: RE | Admit: 2016-12-17 | Discharge: 2016-12-17 | Disposition: A | Discharge status: Home / Self Care | Payer: 59 | Source: Ambulatory Visit | Attending: Internal Medicine | Admitting: Internal Medicine

## 2016-12-17 ENCOUNTER — Other Ambulatory Visit (HOSPITAL_BASED_OUTPATIENT_CLINIC_OR_DEPARTMENT_OTHER): Payer: 59

## 2016-12-17 VITALS — BP 124/78 | HR 89 | Temp 98.3°F | Resp 20

## 2016-12-17 DIAGNOSIS — D701 Agranulocytosis secondary to cancer chemotherapy: Secondary | ICD-10-CM

## 2016-12-17 DIAGNOSIS — C3492 Malignant neoplasm of unspecified part of left bronchus or lung: Secondary | ICD-10-CM

## 2016-12-17 DIAGNOSIS — D649 Anemia, unspecified: Secondary | ICD-10-CM

## 2016-12-17 DIAGNOSIS — T451X5A Adverse effect of antineoplastic and immunosuppressive drugs, initial encounter: Principal | ICD-10-CM

## 2016-12-17 DIAGNOSIS — C3412 Malignant neoplasm of upper lobe, left bronchus or lung: Secondary | ICD-10-CM

## 2016-12-17 DIAGNOSIS — E876 Hypokalemia: Secondary | ICD-10-CM

## 2016-12-17 HISTORY — DX: Adverse effect of antineoplastic and immunosuppressive drugs, initial encounter: T45.1X5A

## 2016-12-17 HISTORY — DX: Agranulocytosis secondary to cancer chemotherapy: D70.1

## 2016-12-17 HISTORY — DX: Hypokalemia: E87.6

## 2016-12-17 LAB — ABO/RH: ABO/RH(D): A POS

## 2016-12-17 LAB — COMPREHENSIVE METABOLIC PANEL
ALBUMIN: 2.2 g/dL — AB (ref 3.5–5.0)
ALK PHOS: 139 U/L (ref 40–150)
ALT: 38 U/L (ref 0–55)
AST: 33 U/L (ref 5–34)
Anion Gap: 14 mEq/L — ABNORMAL HIGH (ref 3–11)
BILIRUBIN TOTAL: 1.24 mg/dL — AB (ref 0.20–1.20)
BUN: 12.7 mg/dL (ref 7.0–26.0)
CO2: 27 mEq/L (ref 22–29)
CREATININE: 1 mg/dL (ref 0.6–1.1)
Calcium: 9.4 mg/dL (ref 8.4–10.4)
Chloride: 90 mEq/L — ABNORMAL LOW (ref 98–109)
EGFR: 64 mL/min/{1.73_m2} — ABNORMAL LOW (ref >90)
GLUCOSE: 116 mg/dL (ref 70–140)
POTASSIUM: 3 meq/L — AB (ref 3.5–5.1)
SODIUM: 132 meq/L — AB (ref 136–145)
TOTAL PROTEIN: 6.7 g/dL (ref 6.4–8.3)

## 2016-12-17 LAB — CBC WITH DIFFERENTIAL/PLATELET
BASO%: 0.8 % (ref 0.0–2.0)
Basophils Absolute: 0 10*3/uL (ref 0.0–0.1)
EOS%: 0.8 % (ref 0.0–7.0)
Eosinophils Absolute: 0 10*3/uL (ref 0.0–0.5)
HEMATOCRIT: 24.3 % — AB (ref 34.8–46.6)
HEMOGLOBIN: 8.2 g/dL — AB (ref 11.6–15.9)
LYMPH#: 0.6 10*3/uL — AB (ref 0.9–3.3)
LYMPH%: 52.9 % — ABNORMAL HIGH (ref 14.0–49.7)
MCH: 32.3 pg (ref 25.1–34.0)
MCHC: 33.7 g/dL (ref 31.5–36.0)
MCV: 95.7 fL (ref 79.5–101.0)
MONO#: 0.2 10*3/uL (ref 0.1–0.9)
MONO%: 14.9 % — ABNORMAL HIGH (ref 0.0–14.0)
NEUT#: 0.4 10*3/uL — CL (ref 1.5–6.5)
NEUT%: 30.6 % — AB (ref 38.4–76.8)
NRBC: 0 % (ref 0–0)
Platelets: 45 10*3/uL — ABNORMAL LOW (ref 145–400)
RBC: 2.54 10*6/uL — ABNORMAL LOW (ref 3.70–5.45)
RDW: 15.1 % — AB (ref 11.2–14.5)
WBC: 1.2 10*3/uL — ABNORMAL LOW (ref 3.9–10.3)

## 2016-12-17 LAB — PREPARE RBC (CROSSMATCH)

## 2016-12-17 LAB — MAGNESIUM: Magnesium: 1.2 mg/dl — CL (ref 1.5–2.5)

## 2016-12-17 MED ORDER — POTASSIUM CHLORIDE 2 MEQ/ML IV SOLN: Freq: Once | INTRAVENOUS | Status: DC

## 2016-12-17 MED ORDER — DIPHENHYDRAMINE HCL 25 MG PO CAPS
ORAL_CAPSULE | ORAL | Status: AC
  Filled 2016-12-17: qty 1

## 2016-12-17 MED ORDER — TBO-FILGRASTIM 480 MCG/0.8ML ~~LOC~~ SOSY
480.0000 ug | PREFILLED_SYRINGE | Freq: Once | SUBCUTANEOUS | Status: AC
  Administered 2016-12-17: 480 ug via SUBCUTANEOUS
  Filled 2016-12-17: qty 0.8

## 2016-12-17 MED ORDER — POTASSIUM CHLORIDE 2 MEQ/ML IV SOLN
Freq: Once | INTRAVENOUS | Status: AC
  Administered 2016-12-17: 11:00:00 via INTRAVENOUS
  Filled 2016-12-17: qty 1000

## 2016-12-17 MED ORDER — SODIUM CHLORIDE 0.9 % IV SOLN
Freq: Once | INTRAVENOUS | Status: AC
  Administered 2016-12-17: 13:00:00 via INTRAVENOUS

## 2016-12-17 MED ORDER — ACETAMINOPHEN 325 MG PO TABS
ORAL_TABLET | ORAL | Status: AC
  Filled 2016-12-17: qty 2

## 2016-12-17 MED ORDER — ACETAMINOPHEN 325 MG PO TABS
650.0000 mg | ORAL_TABLET | Freq: Once | ORAL | Status: AC
  Administered 2016-12-17: 650 mg via ORAL

## 2016-12-17 MED ORDER — MAGNESIUM SULFATE 50 % IJ SOLN
4.0000 g | Freq: Once | INTRAMUSCULAR | Status: AC
  Administered 2016-12-17: 4 g via INTRAVENOUS
  Filled 2016-12-17: qty 8

## 2016-12-17 MED ORDER — DIPHENHYDRAMINE HCL 25 MG PO CAPS
25.0000 mg | ORAL_CAPSULE | Freq: Once | ORAL | Status: AC
Start: 2016-12-17 — End: 2016-12-17
  Administered 2016-12-17: 25 mg via ORAL

## 2016-12-17 NOTE — Patient Instructions (Addendum)
DO NOT COME Monday FOR NEULASTA SHOT COME Saturday AT 11:00 FOR [place] SHOT #2 TAKE CLARITIN TODAY WHEN GET HOME    Tbo-Filgrastim injection (Granix) What is this medicine? TBO-FILGRASTIM (T B O fil GRA stim) is a granulocyte colony-stimulating factor that stimulates the growth of neutrophils, a type of white blood cell important in the body's fight against infection. It is used to reduce the incidence of fever and infection in patients with certain types of cancer who are receiving chemotherapy that affects the bone marrow. This medicine may be used for other purposes; ask your health care provider or pharmacist if you have questions. COMMON BRAND NAME(S): Granix What should I tell my health care provider before I take this medicine? They need to know if you have any of these conditions: -bone scan or tests planned -kidney disease -sickle cell anemia -an unusual or allergic reaction to tbo-filgrastim, filgrastim, pegfilgrastim, other medicines, foods, dyes, or preservatives -pregnant or trying to get pregnant -breast-feeding How should I use this medicine? This medicine is for injection under the skin. If you get this medicine at home, you will be taught how to prepare and give this medicine. Refer to the Instructions for Use that come with your medication packaging. Use exactly as directed. Take your medicine at regular intervals. Do not take your medicine more often than directed. It is important that you put your used needles and syringes in a special sharps container. Do not put them in a trash can. If you do not have a sharps container, call your pharmacist or healthcare provider to get one. Talk to your pediatrician regarding the use of this medicine in children. Special care may be needed. Overdosage: If you think you have taken too much of this medicine contact a poison control center or emergency room at once. NOTE: This medicine is only for you. Do not share this medicine with  others. What if I miss a dose? It is important not to miss your dose. Call your doctor or health care professional if you miss a dose. What may interact with this medicine? This medicine may interact with the following medications: -medicines that may cause a release of neutrophils, such as lithium This list may not describe all possible interactions. Give your health care provider a list of all the medicines, herbs, non-prescription drugs, or dietary supplements you use. Also tell them if you smoke, drink alcohol, or use illegal drugs. Some items may interact with your medicine. What should I watch for while using this medicine? You may need blood work done while you are taking this medicine. What side effects may I notice from receiving this medicine? Side effects that you should report to your doctor or health care professional as soon as possible: -allergic reactions like skin rash, itching or hives, swelling of the face, lips, or tongue -blood in the urine -dark urine -dizziness -fast heartbeat -feeling faint -shortness of breath or breathing problems -signs and symptoms of infection like fever or chills; cough; or sore throat -signs and symptoms of kidney injury like trouble passing urine or change in the amount of urine -stomach or side pain, or pain at the shoulder -sweating -swelling of the legs, ankles, or abdomen -tiredness Side effects that usually do not require medical attention (report to your doctor or health care professional if they continue or are bothersome): -bone pain -headache -muscle pain -vomiting This list may not describe all possible side effects. Call your doctor for medical advice about side effects. You  may report side effects to FDA at 1-800-FDA-1088. Where should I keep my medicine? Keep out of the reach of children. Store in a refrigerator between 2 and 8 degrees C (36 and 46 degrees F). Keep in carton to protect from light. Throw away this medicine  if it is left out of the refrigerator for more than 5 consecutive days. Throw away any unused medicine after the expiration date. NOTE: This sheet is a summary. It may not cover all possible information. If you have questions about this medicine, talk to your doctor, pharmacist, or health care provider.  2018 Elsevier/Gold Standard (2015-06-02 19:07:04)  Blood Transfusion, Adult A blood transfusion is a procedure in which you receive donated blood, including plasma, platelets, and red blood cells, through an IV tube. You may need a blood transfusion because of illness, surgery, or injury. The blood may come from a donor. You may also be able to donate blood for yourself (autologous blood donation) before a surgery if you know that you might require a blood transfusion. The blood given in a transfusion is made up of different types of cells. You may receive:  Red blood cells. These carry oxygen to the cells in the body.  White blood cells. These help you fight infections.  Platelets. These help your blood to clot.  Plasma. This is the liquid part of your blood and it helps with fluid imbalances.  If you have hemophilia or another clotting disorder, you may also receive other types of blood products. Tell a health care provider about:  Any allergies you have.  All medicines you are taking, including vitamins, herbs, eye drops, creams, and over-the-counter medicines.  Any problems you or family members have had with anesthetic medicines.  Any blood disorders you have.  Any surgeries you have had.  Any medical conditions you have, including any recent fever or cold symptoms.  Whether you are pregnant or may be pregnant.  Any previous reactions you have had during a blood transfusion. What are the risks? Generally, this is a safe procedure. However, problems may occur, including:  Having an allergic reaction to something in the donated blood. Hives and itching may be symptoms of this  type of reaction.  Fever. This may be a reaction to the white blood cells in the transfused blood. Nausea or chest pain may accompany a fever.  Iron overload. This can happen from having many transfusions.  Transfusion-related acute lung injury (TRALI). This is a rare reaction that causes lung damage. The cause is not known.TRALI can occur within hours of a transfusion or several days later.  Sudden (acute) or delayed hemolytic reactions. This happens if your blood does not match the cells in your transfusion. Your body's defense system (immune system) may try to attack the new cells. This complication is rare. The symptoms include fever, chills, nausea, and low back pain or chest pain.  Infection or disease transmission. This is rare.  What happens before the procedure?  You will have a blood test to determine your blood type. This is necessary to know what kind of blood your body will accept and to match it to the donor blood.  If you are going to have a planned surgery, you may be able to do an autologous blood donation. This may be done in case you need to have a transfusion.  If you have had an allergic reaction to a transfusion in the past, you may be given medicine to help prevent a reaction. This medicine  may be given to you by mouth or through an IV tube.  You will have your temperature, blood pressure, and pulse monitored before the transfusion.  Follow instructions from your health care provider about eating and drinking restrictions.  Ask your health care provider about: ? Changing or stopping your regular medicines. This is especially important if you are taking diabetes medicines or blood thinners. ? Taking medicines such as aspirin and ibuprofen. These medicines can thin your blood. Do not take these medicines before your procedure if your health care provider instructs you not to. What happens during the procedure?  An IV tube will be inserted into one of your  veins.  The bag of donated blood will be attached to your IV tube. The blood will then enter through your vein.  Your temperature, blood pressure, and pulse will be monitored regularly during the transfusion. This monitoring is done to detect early signs of a transfusion reaction.  If you have any signs or symptoms of a reaction, your transfusion will be stopped and you may be given medicine.  When the transfusion is complete, your IV tube will be removed.  Pressure may be applied to the IV site for a few minutes.  A bandage (dressing) will be applied. The procedure may vary among health care providers and hospitals. What happens after the procedure?  Your temperature, blood pressure, heart rate, breathing rate, and blood oxygen level will be monitored often.  Your blood may be tested to see how you are responding to the transfusion.  You may be warmed with fluids or blankets to maintain a normal body temperature. Summary  A blood transfusion is a procedure in which you receive donated blood, including plasma, platelets, and red blood cells, through an IV tube.  Your temperature, blood pressure, and pulse will be monitored before, during, and after the transfusion.  Your blood may be tested after the transfusion to see how your body has responded. This information is not intended to replace advice given to you by your health care provider. Make sure you discuss any questions you have with your health care provider. Document Released: 04/09/2000 Document Revised: 01/08/2016 Document Reviewed: 01/08/2016 Elsevier Interactive Patient Education  2018 [employer].    Neutropenia Neutropenia is a condition that occurs when you have a lower-than-normal level of a type of white blood cell (neutrophil) in your body. Neutrophils are made in the spongy center of large bones (bone marrow) and they fight infections. Neutrophils are your body's main defense against bacterial and fungal  infections. The fewer neutrophils you have and the longer your body remains without them, the greater your risk of getting a severe infection. What are the causes? This condition can occur if your body uses up or destroys neutrophils faster than your bone marrow can make them. This problem may happen because of:  Bacterial or fungal infection.  Allergic disorders.  Reactions to some medicines.  Autoimmune disease.  An enlarged spleen.  This condition can also occur if your bone marrow does not produce enough neutrophils. This problem may be caused by:  Cancer.  Cancer treatments, such as radiation or chemotherapy.  Viral infections.  Medicines, such as phenytoin.  Vitamin B12 deficiency.  Diseases of the bone marrow.  Environmental toxins, such as insecticides.  What are the signs or symptoms? This condition does not usually cause symptoms. If symptoms are present, they are usually caused by an underlying infection. Symptoms of an infection may include:  Fever.  Chills.  Swollen glands.  Oral or anal ulcers.  Cough and shortness of breath.  Rash.  Skin infection.  Fatigue.  How is this diagnosed? Your health care provider may suspect neutropenia if you have:  A condition that may cause neutropenia.  Symptoms of infection, especially fever.  Frequent and unusual infections.  You will have a medical history and physical exam. Tests will also be done, such as:  A complete blood count (CBC).  A procedure to collect a sample of bone marrow for examination (bone marrow biopsy).  A chest X-ray.  A urine culture.  A blood culture.  How is this treated? Treatment depends on the underlying cause and severity of your condition. Mild neutropenia may not require treatment. Treatment may include medicines, such as:  Antibiotic medicine given through an IV tube.  Antiviral medicines.  Antifungal medicines.  A medicine to increase neutrophil production  (colony-stimulating factor). You may get this drug through an IV tube or by injection.  Steroids given through an IV tube.  If an underlying condition is causing neutropenia, you may need treatment for that condition. If medicines you are taking are causing neutropenia, your health care provider may have you stop taking those medicines. Follow these instructions at home: Medicines  Take over-the-counter and prescription medicines only as told by your health care provider.  Get a seasonal flu shot (influenza vaccine). Lifestyle  Do not eat unpasteurized foods.Do not eat unwashed raw fruits or vegetables.  Avoid exposure to groups of people or children.  Avoid being around people who are sick.  Avoid being around dirt or dust, such as in construction areas or gardens.  Do not provide direct care for pets. Avoid animal droppings. Do not clean litter boxes and bird cages. Hygiene   Bathe daily.  Clean the area between the genitals and the anus (perineal area) after you urinate or have a bowel movement. If you are female, wipe from front to back.  Brush your teeth with a soft toothbrush before and after meals.  Do not use a razor that has a blade. Use an electric razor to remove hair.  Wash your hands often. Make sure others who come in contact with you also wash their hands. If soap and water are not available, use hand sanitizer. General instructions  Do not have sex unless your health care provider has approved.  Take actions to avoid cuts and burns. For example: ? Be cautious when you use knives. Always cut away from yourself. ? Keep knives in protective sheaths or guards when not in use. ? Use oven mitts when you cook with a hot stove, oven, or grill. ? Stand a safe distance away from open fires.  Avoid people who received a vaccine in the past 30 days if that vaccine contained a live version of the germ (live vaccine). You should not get a live vaccine. Common live  vaccines are varicella, measles, mumps, and rubella.  Do not share food utensils.  Do not use tampons, enemas, or rectal suppositories unless your health care provider has approved.  Keep all appointments as told by your health care provider. This is important. Contact a health care provider if:  You have a fever.  You have chills or you start to shake.  You have: ? A sore throat. ? A warm, red, or tender area on your skin. ? A cough. ? Frequent or painful urination. ? Vaginal discharge or itching.  You develop: ? Sores in your mouth or anus. ?  Swollen lymph nodes. ? Red streaks on the skin. ? A rash.  You feel: ? Nauseous or you vomit. ? Very fatigued. ? Short of breath. This information is not intended to replace advice given to you by your health care provider. Make sure you discuss any questions you have with your health care provider. Document Released: 10/02/2001 Document Revised: 09/18/2015 Document Reviewed: 10/23/2014 Elsevier Interactive Patient Education  [employer].

## 2016-12-17 NOTE — Progress Notes (Signed)
Patient was identified on the MST to be at risk for malnutrition secondary to poor appetite and weight loss.  61 year old female diagnosed with small cell lung cancer with brain metastases.  She is receiving cisplatin Irinotecan. She is a patient of Dr. Julien Nordmann.  Past medical history includes breast cancer, diabetes, hypertension, radiation therapy, COPD, and CAP.  Medications include Decadron, Pepcid, Imodium, Ativan, magnesium oxide, MiraLAX K dur, Compazine, and Senokot.  Labs include sodium 128, potassium 3.3, and albumin 2.4 on August 15.  Height: 66 inches. Weight: 212.6 pounds. Usual body weight 267 pounds in January 2018 BMI: 34.31.  Patient reports she does have a poor appetite and occasional nausea. She reports some diarrhea stools on a daily basis but takes Imodium. She has sacral decubitus ulcers and is followed by the wound clinic. She is also followed by palliative care. She tries to eat small amounts.  She typically will drink 2 Ensure Plus a day.  Nutrition diagnosis:  Severe malnutrition related to small cell lung cancer and associated treatments as evidenced by 20% weight loss over 8 months and severe depletion of body fat and muscle mass on physical exam.  Intervention: Patient educated to consume high-calorie, high-protein foods in small amounts throughout the day. Recommended patient increase Ensure Plus 3 times a day between meals. Provided one complementary case of Ensure Plus and coupons. Provided education on increasing appetite, relieving nausea and assisting with diarrhea. Fact sheets were provided. Questions were answered.  Teach back method used.  Contact information was given.  Monitoring, evaluation, goals: Patient will tolerate increased calories and protein to continue healing and minimize further weight loss.  Next visit: Thursday, September 6 during infusion.  **Disclaimer: This note was dictated with voice recognition software. Similar sounding  words can inadvertently be transcribed and this note may contain transcription errors which may not have been corrected upon publication of note.**

## 2016-12-17 NOTE — Progress Notes (Signed)
Pt stated that she has had diarrhea for the past 24-48 hours.  No appetite. Per Dr Julien Nordmann no tx today. New tx and orders placed and administered according to [facility]. 2 units of PRBC to be infused today also.  2 unsuccessful IV start attempts made. IV team called to gain PIV access.   IV team arrived at 1030, started PIV.

## 2016-12-18 ENCOUNTER — Emergency Department (HOSPITAL_BASED_OUTPATIENT_CLINIC_OR_DEPARTMENT_OTHER): Payer: 59

## 2016-12-18 ENCOUNTER — Encounter (HOSPITAL_BASED_OUTPATIENT_CLINIC_OR_DEPARTMENT_OTHER): Payer: Self-pay | Admitting: Emergency Medicine

## 2016-12-18 ENCOUNTER — Encounter: Payer: Self-pay | Admitting: *Deleted

## 2016-12-18 ENCOUNTER — Emergency Department (HOSPITAL_BASED_OUTPATIENT_CLINIC_OR_DEPARTMENT_OTHER)
Admission: EM | Admit: 2016-12-18 | Discharge: 2016-12-18 | Disposition: A | Discharge status: Home / Self Care | Payer: 59 | Attending: Emergency Medicine | Admitting: Emergency Medicine

## 2016-12-18 ENCOUNTER — Ambulatory Visit (HOSPITAL_BASED_OUTPATIENT_CLINIC_OR_DEPARTMENT_OTHER): Payer: 59

## 2016-12-18 ENCOUNTER — Other Ambulatory Visit: Payer: Self-pay | Admitting: *Deleted

## 2016-12-18 VITALS — BP 93/67 | HR 102 | Temp 98.3°F | Resp 18

## 2016-12-18 DIAGNOSIS — D701 Agranulocytosis secondary to cancer chemotherapy: Secondary | ICD-10-CM | POA: Diagnosis not present

## 2016-12-18 DIAGNOSIS — J449 Chronic obstructive pulmonary disease, unspecified: Secondary | ICD-10-CM | POA: Insufficient documentation

## 2016-12-18 DIAGNOSIS — L03011 Cellulitis of right finger: Secondary | ICD-10-CM

## 2016-12-18 DIAGNOSIS — C3492 Malignant neoplasm of unspecified part of left bronchus or lung: Secondary | ICD-10-CM

## 2016-12-18 DIAGNOSIS — M79641 Pain in right hand: Secondary | ICD-10-CM | POA: Diagnosis present

## 2016-12-18 DIAGNOSIS — C3412 Malignant neoplasm of upper lobe, left bronchus or lung: Secondary | ICD-10-CM | POA: Diagnosis not present

## 2016-12-18 DIAGNOSIS — Z79899 Other long term (current) drug therapy: Secondary | ICD-10-CM | POA: Insufficient documentation

## 2016-12-18 DIAGNOSIS — C3490 Malignant neoplasm of unspecified part of unspecified bronchus or lung: Secondary | ICD-10-CM | POA: Diagnosis not present

## 2016-12-18 DIAGNOSIS — I10 Essential (primary) hypertension: Secondary | ICD-10-CM | POA: Diagnosis not present

## 2016-12-18 DIAGNOSIS — Z853 Personal history of malignant neoplasm of breast: Secondary | ICD-10-CM | POA: Insufficient documentation

## 2016-12-18 LAB — TYPE AND SCREEN
ABO/RH(D): A POS
ANTIBODY SCREEN: NEGATIVE
UNIT DIVISION: 0
UNIT DIVISION: 0

## 2016-12-18 LAB — BPAM RBC
BLOOD PRODUCT EXPIRATION DATE: 201809062359
BLOOD PRODUCT EXPIRATION DATE: 201809062359
ISSUE DATE / TIME: 201808241248
ISSUE DATE / TIME: 201808241248
UNIT TYPE AND RH: 6200
Unit Type and Rh: 6200

## 2016-12-18 MED ORDER — PEGFILGRASTIM INJECTION 6 MG/0.6ML ~~LOC~~
6.0000 mg | PREFILLED_SYRINGE | Freq: Once | SUBCUTANEOUS | Status: AC
  Administered 2016-12-18: 6 mg via SUBCUTANEOUS

## 2016-12-18 MED ORDER — CEPHALEXIN 500 MG PO CAPS: 500.0000 mg | ORAL_CAPSULE | Freq: Four times a day (QID) | ORAL | 0 refills | Status: AC

## 2016-12-18 MED ORDER — LOPERAMIDE HCL 2 MG PO CAPS
2.0000 mg | ORAL_CAPSULE | Freq: Once | ORAL | Status: AC
  Administered 2016-12-18: 2 mg via ORAL
  Filled 2016-12-18: qty 1

## 2016-12-18 NOTE — ED Notes (Signed)
Pt to BR via w/c.

## 2016-12-18 NOTE — ED Notes (Signed)
ED Provider at bedside. 

## 2016-12-18 NOTE — ED Provider Notes (Signed)
[place] DEPT [facility] Provider Note   CSN: [id number] Arrival date & time: 12/18/16  1525     History   Chief Complaint Chief Complaint  Patient presents with  . Hand Pain    infection    HPI Sharon [name] is a 61 y.o. female.  The history is provided by the patient, medical records and a relative.  Hand Pain  This is a new problem. The current episode started more than 2 days ago. The problem occurs constantly. The problem has been gradually worsening. Pertinent negatives include no chest pain, no abdominal pain, no headaches and no shortness of breath. Nothing aggravates the symptoms. Nothing relieves the symptoms. She has tried nothing for the symptoms. The treatment provided no relief.    Past Medical History:  Diagnosis Date  . Cancer of right breast ([place]) 2009  . CAP (community acquired pneumonia) 11/06/2015  . Chemotherapy induced neutropenia ([place]) 12/17/2016  . COPD (chronic obstructive pulmonary disease) ([place])   . Elevated blood pressure   . Encounter for antineoplastic chemotherapy 01/05/2016  . History of radiation therapy 05/04/16-05/18/16   whole brain 25 Gy in 10 fractions  . History of radiation therapy 09/01/16 - 09/21/16   Left Lung treated to 35 Gy in 14 fractions  . Hoarseness of voice    "for the last 2 months" (11/06/2015)  . Hypertension   . Hypokalemia 12/17/2016  . Lung mass dx'd 10/2015  . Menopause   . Personal history of breast cancer 03/29/2016  . Pre-diabetes   . Small cell lung cancer ([place])   . Vitamin D deficiency     Patient Active Problem List   Diagnosis Date Noted  . Chemotherapy induced neutropenia ([place]) 12/17/2016  . Hypokalemia 12/17/2016  . Partial small bowel obstruction ([place]) 10/15/2016  . Brain tumor ([place])   . Seizure ([place]) 09/28/2016  . Brain metastasis ([place]) 09/27/2016  . Hemoptysis 08/17/2016  . Personal history of breast cancer 03/29/2016  . Encounter for antineoplastic chemotherapy 01/05/2016  . Nausea without vomiting  12/15/2015  . Primary small cell carcinoma of left lung ([place]) 11/18/2015  . Mediastinal adenopathy   . Breast cancer ([place]) 07/18/2012  . COPD, severe ([place]) 08/18/2011  . Smoking 08/18/2011    Past Surgical History:  Procedure Laterality Date  . BREAST BIOPSY Right 2009  . BREAST LUMPECTOMY Right 2009  . UTERINE FIBROID SURGERY  2000  . VAGINAL HYSTERECTOMY  2000  . VESICOVAGINAL FISTULA CLOSURE W/ TAH  2000  . VIDEO BRONCHOSCOPY WITH ENDOBRONCHIAL ULTRASOUND N/A 11/10/2015   Procedure: VIDEO BRONCHOSCOPY WITH ENDOBRONCHIAL ULTRASOUND;  Surgeon: Javier Glazier, MD;  Location: [facility];  Service: Thoracic;  Laterality: N/A;    OB History    No data available       Home Medications    Prior to Admission medications   Medication Sig Start Date End Date Taking? Authorizing Provider  albuterol (PROVENTIL HFA;VENTOLIN HFA) 108 (90 Base) MCG/ACT inhaler Inhale 1 puff into the lungs every 6 (six) hours as needed for wheezing or shortness of breath.    [provider]  budesonide-formoterol (SYMBICORT) 160-4.5 MCG/ACT inhaler Inhale 2 puffs into the lungs 2 (two) times daily. 08/17/16   Javier Glazier, MD  dexamethasone (DECADRON) 4 MG tablet Take 1 tablet (4 mg total) by mouth 2 (two) times daily. take 4 mg twice a day for 7 days then take 1/2 tablet  ( 2 mg) twice a day for 7 days Patient not taking: Reported on 12/08/2016 11/04/16  Curt Bears, MD  famotidine (PEPCID) 20 MG tablet Take 1 tablet (20 mg total) by mouth daily. Take pepcid 30 minutes prior to kdur dose. 11/22/16   Curt Bears, MD  fluconazole (DIFLUCAN) 100 MG tablet Take 1 tablet (100 mg total) by mouth daily. Patient not taking: Reported on 12/08/2016 11/19/16   Owens Shark, NP  fluticasone [facility]) 50 MCG/ACT nasal spray Place 1 spray into both nostrils daily.  08/20/16   [provider]  Ipratropium-Albuterol (COMBIVENT RESPIMAT) 20-100 MCG/ACT AERS respimat Inhale 1 puff into the lungs  every 6 (six) hours.    [provider]  levETIRAcetam (KEPPRA) 500 MG tablet TAKE 1 TABLET(500 MG) BY MOUTH TWICE DAILY Patient not taking: Reported on 12/08/2016 11/04/16   Curt Bears, MD  loperamide (IMODIUM) 2 MG capsule Take by mouth as needed for diarrhea or loose stools.    [provider]  LORazepam (ATIVAN) 0.5 MG tablet 1 tablet po 30 minutes prior to radiation or MRI 09/28/16   Hayden Pedro, PA-C  magnesium oxide (MAG-OX) 400 (241.3 Mg) MG tablet Take 1 tablet (400 mg total) by mouth 3 (three) times daily. 12/08/16   Owens Shark, NP  meclizine (ANTIVERT) 12.5 MG tablet Take 1-2 tablets (12.5-25 mg total) by mouth 3 (three) times daily as needed for dizziness. 06/05/16   Virgel Manifold, MD  montelukast (SINGULAIR) 10 MG tablet Take 10 mg by mouth at bedtime.  09/06/16   [provider]  polyethylene glycol powder (GLYCOLAX/MIRALAX) powder Take 1 Container by mouth daily.    [provider]  potassium chloride SA (K-DUR,KLOR-CON) 20 MEQ tablet Take 1 tab twice a day for 10 days 12/03/16   Curt Bears, MD  prochlorperazine (COMPAZINE) 10 MG tablet TAKE 1 TABLET BY MOUTH EVERY 6 HOURS AS NEEDED FOR NAUSEA AND VOMITING 03/12/16   Curt Bears, MD  senna (SENOKOT) 8.6 MG TABS tablet Take 1 tablet (8.6 mg total) by mouth daily. 10/17/16   Patrecia Pour, MD  UNABLE TO FIND Take 10 mLs by mouth daily as needed. Med Name: CBD Oil    [provider]    Family History Family History  Problem Relation Age of Onset  . Adopted: Yes  . Cancer Neg Hx     Social History Social History  Substance Use Topics  . Smoking status: Former Smoker    Packs/day: 0.10    Years: 46.00    Types: Cigarettes    Quit date: 11/11/2015  . Smokeless tobacco: Never Used     Comment: Peak rate of 2ppd  . Alcohol use No     Allergies   Patient has no known allergies.   Review of Systems Review of Systems  Constitutional: Positive for chills.  Negative for diaphoresis (intermittent), fatigue and fever.  HENT: Negative for congestion and rhinorrhea.   Respiratory: Negative for cough, chest tightness, shortness of breath, wheezing and stridor.   Cardiovascular: Negative for chest pain, palpitations and leg swelling.  Gastrointestinal: Negative for abdominal pain, diarrhea, nausea and vomiting.  Genitourinary: Negative for dysuria, flank pain and frequency.  Musculoskeletal: Negative for back pain, neck pain and neck stiffness.  Skin: Positive for rash. Negative for wound.  Neurological: Negative for light-headedness and headaches.  Psychiatric/Behavioral: Negative for agitation.  All other systems reviewed and are negative.    Physical Exam Updated Vital Signs BP 118/65 (BP Location: Left Arm)   Pulse (!) 108   Temp 99 F (37.2 C) (Oral)   Resp 20  Ht 5\' 6"  (1.676 m)   Wt 96.2 kg (212 lb)   SpO2 98%   BMI 34.22 kg/m   Physical Exam  Constitutional: She is oriented to person, place, and time. She appears well-developed and well-nourished. No distress.  HENT:  Head: Normocephalic.  Nose: Nose normal.  Mouth/Throat: No oropharyngeal exudate.  Eyes: Pupils are equal, round, and reactive to light. Conjunctivae are normal.  Neck: Normal range of motion.  Cardiovascular: Normal rate and intact distal pulses.   No murmur heard. Pulmonary/Chest: Effort normal. No respiratory distress. She exhibits no tenderness.  Abdominal: Soft. There is no tenderness.  Musculoskeletal: She exhibits tenderness.       Right hand: She exhibits tenderness. She exhibits normal range of motion, no bony tenderness, normal capillary refill and no deformity. Normal sensation noted. Normal strength noted.       Hands: Normal cap refill, pulse, sensation in RUE.   Neurological: She is alert and oriented to person, place, and time. No cranial nerve deficit or sensory deficit. She exhibits normal muscle tone.  Skin: Capillary refill takes less than  2 seconds. She is not diaphoretic. There is erythema.  Psychiatric: She has a normal mood and affect.  Nursing note and vitals reviewed.    ED Treatments / Results  Labs (all labs ordered are listed, but only abnormal results are displayed) Labs Reviewed - No data to display  EKG  EKG Interpretation None       Radiology Dg Finger Ring Right  Result Date: 12/18/2016 CLINICAL DATA:  Pain and redness surround nail bed. EXAM: RIGHT RING FINGER 2+V COMPARISON:  None. FINDINGS: No fracture. No subluxation or dislocation. No definite bony destruction to suggest osteomyelitis. IMPRESSION: No gross bony destruction to suggest osteomyelitis by x-ray. Electronically Signed   By: Misty Stanley M.D.   On: 12/18/2016 17:26    Procedures Drain paronychia Date/Time: 12/19/2016 2:49 AM Performed by: Courtney Paris Authorized by: Courtney Paris  Consent: Verbal consent obtained. Risks and benefits: risks, benefits and alternatives were discussed Consent given by: patient Patient identity confirmed: verbally with patient Time out: Immediately prior to procedure a "time out" was called to verify the correct patient, procedure, equipment, support staff and site/side marked as required. Preparation: Patient was prepped and draped in the usual sterile fashion. Local anesthesia used: no  Anesthesia: Local anesthesia used: no  Sedation: Patient sedated: no Patient tolerance: Patient tolerated the procedure well with no immediate complications    (including critical care time)  Medications Ordered in ED Medications  loperamide (IMODIUM) capsule 2 mg (2 mg Oral Given 12/18/16 1727)     Initial Impression / Assessment and Plan / ED Course  I have reviewed the triage vital signs and the nursing notes.  Pertinent labs & imaging results that were available during my care of the patient were reviewed by me and considered in my medical decision making (see chart for details).      Sharon [name] is a 61 y.o. female With a past medical history significant for lung cancer with brain metastasis currently on chemotherapy who presents with possible finger infection. Patient instructed to come to the ED for evaluation of right ring finger pain, swelling, and redness. Patient reports that she is currently undergoing chemotherapy and had a Neulasta shot today. After getting injection, nursing told her to go get her finger checked out. Patient says that over the last few days she has had some pain and swelling in the distal right ring  finger. Patient denies history of skin infection but due to her immunosuppression wanted to be evaluated for serious infection. She denies fevers but reports chronic chills. She denies any respiratory symptoms, G.I. symptoms, other than chronic diarrhea, and no urinary symptoms. She denies any significant wrist elbow or upper arm problems. She denies other complaints.  On exam, patient has small area of swelling and redness next to the nail bed on the right fourth finger. The area appears to be a paronychia. Normal sensation, capillary refill, and range of motion. No evidence of felon. Normal grip strength.  Due to immunosuppression, x-ray was obtained showing no evidence of osteomyelitis or gas.   Patient had paronychia drained at the bedside after multiple chlorhexidine swabs. Small amount of purulence was drained. Wound was dressed. Patient will be started on antibiotics due to immunosuppression.   Shared decision-making conversation with patient and daughter was to focus on draining the paronychia and discharge rather than obtaining labs or other workup due to her chronic issues. Patient reports that she will follow up with her oncology team for further management of her blood counts. This was felt agreeable given her reassuring exam and complaints.   Prior to discharge, patient requested one dose of Imodium for diarrhea. This was ordered.  Patient  given instructions to follow up with oncology team and PCP. Patient will watch for infection. Patient will take antibiotics. Patient voiced understanding of the plan of care and was discharged in good condition.        Final Clinical Impressions(s) / ED Diagnoses   Final diagnoses:  Paronychia of finger of right hand    New Prescriptions Discharge Medication List as of 12/18/2016  7:07 PM    START taking these medications   Details  cephALEXin (KEFLEX) 500 MG capsule Take 1 capsule (500 mg total) by mouth 4 (four) times daily., Starting Sat 12/18/2016, Until Sat 12/25/2016, Print        Clinical Impression: 1. Paronychia of finger of right hand     Disposition: Discharge  Condition: Good  I have discussed the results, Dx and Tx plan with the pt(& family if present). He/she/they expressed understanding and agree(s) with the plan. Discharge instructions discussed at great length. Strict return precautions discussed and pt &/or family have verbalized understanding of the instructions. No further questions at time of discharge.    Discharge Medication List as of 12/18/2016  7:07 PM    START taking these medications   Details  cephALEXin (KEFLEX) 500 MG capsule Take 1 capsule (500 mg total) by mouth 4 (four) times daily., Starting Sat 12/18/2016, Until Sat 12/25/2016, Print        Follow Up: No follow-up provider specified.    Ronnald Shedden, Gwenyth Allegra, MD 12/19/16 [phone]

## 2016-12-18 NOTE — ED Triage Notes (Signed)
Chemo pt  Ca lung   Seen at cancer center and was told [place] to ER and have finger looked at

## 2016-12-18 NOTE — Progress Notes (Signed)
Patient came in 12/18/16 for injection. Patient had active orders for Neulasta and Granix. Patient given Neulasta injection and not granix (patient needed to get shot #2 of granix and not the neulasta). Inbasket sent to [facility] and NP. MD Maginat (on call physician notified). Instructed to notify MD Julien Nordmann and do safety zone).

## 2016-12-18 NOTE — Discharge Instructions (Signed)
Your x-ray today did not show evidence of infection in the bone. Exam revealed evidence of a paronychia which we drained at the bedside. Given your chemotherapy use and your immunosuppression, we will send you home with a prescription for antibiotics. Please watch for worsening infection spreading up her finger. If you develop any systemic signs of infection or have worsened appearance of the finger, please return to the nearest emergency department. Please follow-up with your PCP and oncology team for further management.

## 2016-12-20 ENCOUNTER — Other Ambulatory Visit: Payer: Self-pay | Admitting: Medical Oncology

## 2016-12-20 ENCOUNTER — Ambulatory Visit: Payer: 59

## 2016-12-21 ENCOUNTER — Telehealth: Payer: Self-pay | Admitting: Medical Oncology

## 2016-12-21 DIAGNOSIS — L89323 Pressure ulcer of left buttock, stage 3: Secondary | ICD-10-CM | POA: Diagnosis not present

## 2016-12-21 NOTE — Telephone Encounter (Signed)
Yes. Agree.

## 2016-12-21 NOTE — Telephone Encounter (Signed)
Has scan the day after port scheduled. Should she delay port until after scan results reviewed.

## 2016-12-22 NOTE — Telephone Encounter (Signed)
Port cancelled. Daughter aware and will notify pt.

## 2016-12-23 ENCOUNTER — Telehealth: Payer: Self-pay | Admitting: *Deleted

## 2016-12-23 NOTE — Telephone Encounter (Signed)
-----   Message from Ardeen Garland, RN sent at 12/23/2016  8:34 AM EDT ----- Regarding: FW: palliative care referrall requested by daughter   ----- Message ----- From: Curt Bears, MD Sent: 12/22/2016  10:35 PM To: Ardeen Garland, RN Subject: RE: palliative care referrall requested by d#  Yes if Ms. Griffing is in agreement. ----- Message ----- From: Ardeen Garland, RN Sent: 12/22/2016   3:23 PM To: Curt Bears, MD Subject: palliative care referrall requested by daugh#  dtr said you she had talked to you about palliative care referral . Do you want me to make a referral?

## 2016-12-23 NOTE — Telephone Encounter (Signed)
Pt called back advised she does not want a Palliative care consult/referral as " others have been entered". Pt also states " I would rather have [facility] home [facility] referral and this has already been put in place" Confirmed with pt and verbalized understanding. No palliative care referral will be entered per Pt request.

## 2016-12-23 NOTE — Telephone Encounter (Signed)
Call to pt to confirm palliative care referral. Unable to reach pt lmovm for pt to call office.

## 2016-12-24 ENCOUNTER — Ambulatory Visit (HOSPITAL_COMMUNITY)
Admission: RE | Admit: 2016-12-24 | Discharge: 2016-12-24 | Disposition: A | Discharge status: Home / Self Care | Payer: 59 | Source: Ambulatory Visit | Attending: Internal Medicine | Admitting: Internal Medicine

## 2016-12-24 DIAGNOSIS — J439 Emphysema, unspecified: Secondary | ICD-10-CM | POA: Insufficient documentation

## 2016-12-24 DIAGNOSIS — C3492 Malignant neoplasm of unspecified part of left bronchus or lung: Secondary | ICD-10-CM | POA: Diagnosis not present

## 2016-12-24 DIAGNOSIS — C7931 Secondary malignant neoplasm of brain: Secondary | ICD-10-CM

## 2016-12-24 DIAGNOSIS — R911 Solitary pulmonary nodule: Secondary | ICD-10-CM | POA: Diagnosis not present

## 2016-12-24 DIAGNOSIS — I700 Atherosclerosis of aorta: Secondary | ICD-10-CM | POA: Diagnosis not present

## 2016-12-24 MED ORDER — IOPAMIDOL (ISOVUE-300) INJECTION 61%
INTRAVENOUS | Status: AC
  Filled 2016-12-24: qty 100

## 2016-12-24 MED ORDER — IOPAMIDOL (ISOVUE-300) INJECTION 61%
100.0000 mL | Freq: Once | INTRAVENOUS | Status: AC | PRN
  Administered 2016-12-24: 100 mL via INTRAVENOUS

## 2016-12-28 ENCOUNTER — Other Ambulatory Visit (HOSPITAL_COMMUNITY): Payer: 59

## 2016-12-28 ENCOUNTER — Ambulatory Visit (HOSPITAL_COMMUNITY): Payer: 59

## 2016-12-29 ENCOUNTER — Other Ambulatory Visit (HOSPITAL_BASED_OUTPATIENT_CLINIC_OR_DEPARTMENT_OTHER): Payer: 59

## 2016-12-29 ENCOUNTER — Encounter (HOSPITAL_BASED_OUTPATIENT_CLINIC_OR_DEPARTMENT_OTHER): Payer: 59 | Attending: Surgery

## 2016-12-29 ENCOUNTER — Telehealth: Payer: Self-pay | Admitting: Internal Medicine

## 2016-12-29 ENCOUNTER — Encounter: Payer: Self-pay | Admitting: Internal Medicine

## 2016-12-29 ENCOUNTER — Ambulatory Visit (HOSPITAL_BASED_OUTPATIENT_CLINIC_OR_DEPARTMENT_OTHER): Payer: 59 | Admitting: Internal Medicine

## 2016-12-29 ENCOUNTER — Other Ambulatory Visit: Payer: Self-pay | Admitting: Medical Oncology

## 2016-12-29 VITALS — BP 100/54 | HR 95 | Temp 98.7°F | Resp 18 | Wt 209.9 lb

## 2016-12-29 DIAGNOSIS — Z853 Personal history of malignant neoplasm of breast: Secondary | ICD-10-CM | POA: Insufficient documentation

## 2016-12-29 DIAGNOSIS — C7931 Secondary malignant neoplasm of brain: Secondary | ICD-10-CM | POA: Diagnosis not present

## 2016-12-29 DIAGNOSIS — L89323 Pressure ulcer of left buttock, stage 3: Secondary | ICD-10-CM | POA: Insufficient documentation

## 2016-12-29 DIAGNOSIS — J449 Chronic obstructive pulmonary disease, unspecified: Secondary | ICD-10-CM | POA: Insufficient documentation

## 2016-12-29 DIAGNOSIS — D701 Agranulocytosis secondary to cancer chemotherapy: Secondary | ICD-10-CM

## 2016-12-29 DIAGNOSIS — C3412 Malignant neoplasm of upper lobe, left bronchus or lung: Secondary | ICD-10-CM | POA: Diagnosis not present

## 2016-12-29 DIAGNOSIS — Z87891 Personal history of nicotine dependence: Secondary | ICD-10-CM | POA: Insufficient documentation

## 2016-12-29 DIAGNOSIS — T451X5A Adverse effect of antineoplastic and immunosuppressive drugs, initial encounter: Secondary | ICD-10-CM

## 2016-12-29 DIAGNOSIS — Z9221 Personal history of antineoplastic chemotherapy: Secondary | ICD-10-CM | POA: Insufficient documentation

## 2016-12-29 DIAGNOSIS — Z85110 Personal history of malignant carcinoid tumor of bronchus and lung: Secondary | ICD-10-CM | POA: Insufficient documentation

## 2016-12-29 DIAGNOSIS — Z923 Personal history of irradiation: Secondary | ICD-10-CM | POA: Insufficient documentation

## 2016-12-29 DIAGNOSIS — C3492 Malignant neoplasm of unspecified part of left bronchus or lung: Secondary | ICD-10-CM

## 2016-12-29 LAB — CBC WITH DIFFERENTIAL/PLATELET
BASO%: 0.2 % (ref 0.0–2.0)
BASOS ABS: 0 10*3/uL (ref 0.0–0.1)
EOS ABS: 0 10*3/uL (ref 0.0–0.5)
EOS%: 0.6 % (ref 0.0–7.0)
HCT: 26.7 % — ABNORMAL LOW (ref 34.8–46.6)
HGB: 8.5 g/dL — ABNORMAL LOW (ref 11.6–15.9)
LYMPH%: 19 % (ref 14.0–49.7)
MCH: 31.4 pg (ref 25.1–34.0)
MCHC: 31.8 g/dL (ref 31.5–36.0)
MCV: 98.5 fL (ref 79.5–101.0)
MONO#: 0.8 10*3/uL (ref 0.1–0.9)
MONO%: 12.8 % (ref 0.0–14.0)
NEUT#: 4.4 10*3/uL (ref 1.5–6.5)
NEUT%: 67.4 % (ref 38.4–76.8)
Platelets: 88 10*3/uL — ABNORMAL LOW (ref 145–400)
RBC: 2.71 10*6/uL — AB (ref 3.70–5.45)
RDW: 16.9 % — ABNORMAL HIGH (ref 11.2–14.5)
WBC: 6.5 10*3/uL (ref 3.9–10.3)
lymph#: 1.2 10*3/uL (ref 0.9–3.3)

## 2016-12-29 LAB — COMPREHENSIVE METABOLIC PANEL
ALT: 10 U/L (ref 0–55)
AST: 11 U/L (ref 5–34)
Albumin: 2.2 g/dL — ABNORMAL LOW (ref 3.5–5.0)
Alkaline Phosphatase: 104 U/L (ref 40–150)
Anion Gap: 10 mEq/L (ref 3–11)
BUN: 11.6 mg/dL (ref 7.0–26.0)
CO2: 28 meq/L (ref 22–29)
Calcium: 9.2 mg/dL (ref 8.4–10.4)
Chloride: 100 mEq/L (ref 98–109)
Creatinine: 0.8 mg/dL (ref 0.6–1.1)
EGFR: 78 mL/min/{1.73_m2} — AB (ref >90)
GLUCOSE: 135 mg/dL (ref 70–140)
POTASSIUM: 4 meq/L (ref 3.5–5.1)
SODIUM: 138 meq/L (ref 136–145)
Total Bilirubin: 0.34 mg/dL (ref 0.20–1.20)
Total Protein: 6.4 g/dL (ref 6.4–8.3)

## 2016-12-29 LAB — MAGNESIUM: Magnesium: 1.5 mg/dl (ref 1.5–2.5)

## 2016-12-29 NOTE — Telephone Encounter (Signed)
Gave patient AVS and calendar of upcoming October appointments °

## 2016-12-29 NOTE — Progress Notes (Signed)
[place] [phone]   [phone]) [phone]  OFFICE PROGRESS NOTE  [name], [place] [address]  DIAGNOSIS: Extensive stage (T2b, N2, M1a) small cell lung cancer presented with large left upper lobe lung mass and mediastinal lymphadenopathy as well as malignant left pleural effusion diagnosed in July 2017.  PRIOR THERAPY:  1) Systemic chemotherapy with cisplatin 60 MG/M2 on day 1 and etoposide at 120 MG/M2 on days 1, 2 and 3 with Neulasta support on day 4. She is status post 6 cycles. 2) prophylactic cranial irradiation under the care of Dr. Sondra Come completed on 05/18/2016. 3) palliative radiotherapy to the enlarging left upper lobe lung nodule under the care of Dr. Sondra Come. 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  CURRENT THERAPY: Systemic chemotherapy with cisplatin 30 MG/M2 and irinotecan 65 MG/M2 on days 1 and 8 every 3 weeks. First dose 10/21/2016. Status post 3 cycles.  INTERVAL HISTORY: Sharon [name] 61 y.o. female returns to the clinic today for follow-up visit accompanied by her daughter and son-in-law as well as a family friend. The patient is feeling fine today except for fatigue. She missed day 8 of the last cycle because of significant neutropenia. She will receive Neulasta injection at that time. She denied having any current chest pain, shortness of breath, cough or hemoptysis. She denied having any recent weight loss or night sweats. The patient denied having any current nausea or vomiting, diarrhea or constipation. She denied having any fever or chills. She had repeat CT scan of the chest, abdomen and pelvis performed recently and she is here for evaluation and discussion of her scan results.  MEDICAL HISTORY: Past Medical History:  Diagnosis Date  . Cancer of right breast ([facility]) 2009  . CAP (community acquired pneumonia) 11/06/2015  . Chemotherapy induced neutropenia ([facility])  12/17/2016  . COPD (chronic obstructive pulmonary disease) ([facility])   . Elevated blood pressure   . Encounter for antineoplastic chemotherapy 01/05/2016  . History of radiation therapy 05/04/16-05/18/16   whole brain 25 Gy in 10 fractions  . History of radiation therapy 09/01/16 - 09/21/16   Left Lung treated to 35 Gy in 14 fractions  . Hoarseness of voice    "for the last 2 months" (11/06/2015)  . Hypertension   . Hypokalemia 12/17/2016  . Lung mass dx'd 10/2015  . Menopause   . Personal history of breast cancer 03/29/2016  . Pre-diabetes   . Small cell lung cancer ([facility])   . Vitamin D deficiency     ALLERGIES:  has No Known Allergies.  MEDICATIONS:  Current Outpatient Prescriptions  Medication Sig Dispense Refill  . famotidine (PEPCID) 20 MG tablet Take 1 tablet (20 mg total) by mouth daily. Take pepcid 30 minutes prior to kdur dose. 30 tablet 0  . fluticasone (FLONASE) 50 MCG/ACT nasal spray Place 1 spray into both nostrils daily.     . Ipratropium-Albuterol (COMBIVENT RESPIMAT) 20-100 MCG/ACT AERS respimat Inhale 1 puff into the lungs every 6 (six) hours.    . magnesium oxide (MAG-OX) 400 (241.3 Mg) MG tablet Take 1 tablet (400 mg total) by mouth 3 (three) times daily. 90 tablet 0  . montelukast (SINGULAIR) 10 MG tablet Take 10 mg by mouth at bedtime.   2  . SANTYL ointment APP TOPICALLY UTD  0  . UNABLE TO FIND Take 10 mLs by mouth daily as needed. Med Name: CBD Oil    . albuterol (  PROVENTIL HFA;VENTOLIN HFA) 108 (90 Base) MCG/ACT inhaler Inhale 1 puff into the lungs every 6 (six) hours as needed for wheezing or shortness of breath.    . budesonide-formoterol (SYMBICORT) 160-4.5 MCG/ACT inhaler Inhale 2 puffs into the lungs 2 (two) times daily. 1 Inhaler 11  . dexamethasone (DECADRON) 4 MG tablet Take 1 tablet (4 mg total) by mouth 2 (two) times daily. take 4 mg twice a day for 7 days then take 1/2 tablet  ( 2 mg) twice a day for 7 days (Patient not taking: Reported on 12/08/2016) 20 tablet 0    . loperamide (IMODIUM) 2 MG capsule Take by mouth as needed for diarrhea or loose stools.    Marland Kitchen LORazepam (ATIVAN) 0.5 MG tablet 1 tablet po 30 minutes prior to radiation or MRI (Patient not taking: Reported on 12/29/2016) 30 tablet 0  . meclizine (ANTIVERT) 12.5 MG tablet Take 1-2 tablets (12.5-25 mg total) by mouth 3 (three) times daily as needed for dizziness. (Patient not taking: Reported on 12/29/2016) 20 tablet 0  . polyethylene glycol powder (GLYCOLAX/MIRALAX) powder Take 1 Container by mouth daily.    . prochlorperazine (COMPAZINE) 10 MG tablet TAKE 1 TABLET BY MOUTH EVERY 6 HOURS AS NEEDED FOR NAUSEA AND VOMITING (Patient not taking: Reported on 12/29/2016) 360 tablet 1  . senna (SENOKOT) 8.6 MG TABS tablet Take 1 tablet (8.6 mg total) by mouth daily. (Patient not taking: Reported on 12/29/2016) 30 tablet 0   No current facility-administered medications for this visit.     SURGICAL HISTORY:  Past Surgical History:  Procedure Laterality Date  . BREAST BIOPSY Right 2009  . BREAST LUMPECTOMY Right 2009  . UTERINE FIBROID SURGERY  2000  . VAGINAL HYSTERECTOMY  2000  . VESICOVAGINAL FISTULA CLOSURE W/ TAH  2000  . VIDEO BRONCHOSCOPY WITH ENDOBRONCHIAL ULTRASOUND N/A 11/10/2015   Procedure: VIDEO BRONCHOSCOPY WITH ENDOBRONCHIAL ULTRASOUND;  Surgeon: Javier Glazier, MD;  Location: [facility];  Service: Thoracic;  Laterality: N/A;    REVIEW OF SYSTEMS:  Constitutional: positive for fatigue Eyes: negative Ears, nose, mouth, throat, and face: negative Respiratory: negative Cardiovascular: negative Gastrointestinal: negative Genitourinary:negative Integument/breast: negative Hematologic/lymphatic: negative Musculoskeletal:negative Neurological: negative Behavioral/Psych: negative Endocrine: negative Allergic/Immunologic: negative   PHYSICAL EXAMINATION: General appearance: alert, cooperative, fatigued and no distress Head: Normocephalic, without obvious abnormality, atraumatic Neck: no  adenopathy, no JVD, supple, symmetrical, trachea midline and thyroid not enlarged, symmetric, no tenderness/mass/nodules Lymph nodes: Cervical, supraclavicular, and axillary nodes normal. Resp: clear to auscultation bilaterally Back: symmetric, no curvature. ROM normal. No CVA tenderness. Cardio: regular rate and rhythm, S1, S2 normal, no murmur, click, rub or gallop GI: soft, non-tender; bowel sounds normal; no masses,  no organomegaly Extremities: extremities normal, atraumatic, no cyanosis or edema Neurologic: Alert and oriented X 3, normal strength and tone. Normal symmetric reflexes. Normal coordination and gait  ECOG PERFORMANCE STATUS: 1 - Symptomatic but completely ambulatory  Blood pressure (!) 100/54, pulse 95, temperature 98.7 F (37.1 C), temperature source Oral, resp. rate 18, weight 209 lb 14.4 oz (95.2 kg), SpO2 99 %.  LABORATORY DATA: Lab Results  Component Value Date   WBC 6.5 12/29/2016   HGB 8.5 (L) 12/29/2016   HCT 26.7 (L) 12/29/2016   MCV 98.5 12/29/2016   PLT 88 (L) 12/29/2016      Chemistry      Component Value Date/Time   NA 132 (L) 12/17/2016 0802   K 3.0 (LL) 12/17/2016 0802   CL 102 10/17/2016 0412   CL 104 07/18/2012  1451   CO2 27 12/17/2016 0802   BUN 12.7 12/17/2016 0802   CREATININE 1.0 12/17/2016 0802      Component Value Date/Time   CALCIUM 9.4 12/17/2016 0802   ALKPHOS 139 12/17/2016 0802   AST 33 12/17/2016 0802   ALT 38 12/17/2016 0802   BILITOT 1.24 (H) 12/17/2016 0802       RADIOGRAPHIC STUDIES: Ct Chest W Contrast  Result Date: 12/24/2016 CLINICAL DATA:  Extensive stage small cell left lung carcinoma diagnosed July 2017 with ongoing systemic chemotherapy. History of cranial irradiation and palliative radiotherapy to recurrent left upper lobe pulmonary nodule. Restaging. EXAM: CT CHEST AND ABDOMEN WITH CONTRAST TECHNIQUE: Multidetector CT imaging of the chest and abdomen was performed following the standard protocol during bolus  administration of intravenous contrast. CONTRAST:  135mL ISOVUE-300 IOPAMIDOL (ISOVUE-300) INJECTION 61% COMPARISON:  10/11/2016 CT chest, abdomen and pelvis. FINDINGS: CT CHEST FINDINGS Cardiovascular: Normal heart size. No significant pericardial fluid/thickening. Atherosclerotic nonaneurysmal thoracic aorta. Normal caliber pulmonary arteries. No central pulmonary emboli. Mediastinum/Nodes: No discrete thyroid nodules. Unremarkable esophagus. No axillary adenopathy. Enlarged heterogeneous 1.9 cm left supraclavicular node (series 2/image 6), previously 3.2 cm, decreased. Nonenlarged 0.5 cm right supraclavicular node (series 2/ image 6), previously 1.1 cm, decreased. Mildly enlarged 1.0 cm right paratracheal node (series 2/image 17), previously 1.5 cm, decreased. Enlarged heterogeneous 2.4 cm AP window node (series 2/ image 24), previously 2.0 cm, mildly increased. No new pathologically enlarged mediastinal nodes. No pathologically enlarged hilar nodes. Lungs/Pleura: No pneumothorax. No pleural effusion. Irregular 1.3 x 0.6 cm left upper lobe pulmonary nodule (series 7/ image 53), previously 1.3 x 0.6 cm, stable. Mildly increased curvilinear bandlike opacity in the anterior left upper lobe is compatible with evolving postradiation change. No acute consolidative airspace disease or new significant pulmonary nodules. Tiny peripheral apical right upper lobe 2 mm solid pulmonary nodule (series 7/ image 36) is stable since 01/07/2016 and considered benign. Mild paraseptal emphysema. Musculoskeletal: No aggressive appearing focal osseous lesions. Moderate thoracic spondylosis. CT ABDOMEN FINDINGS Hepatobiliary: Normal liver size. A 1.7 cm right liver dome mass (series 2/ image 46) demonstrates discontinuous peripheral nodular hyperenhancement and appears stable since 03/26/2016 CT, most suggestive of a benign hemangioma. No new liver lesions. Normal gallbladder with no radiopaque cholelithiasis. No biliary ductal  dilatation. Pancreas: Normal, with no mass or duct dilation. Spleen: Normal size. No mass. Adrenals/Urinary Tract: Right adrenal 1.0 cm nodule is stable since 11/19/2015 PET-CT, where it was non FDG avid, most compatible with a stable right adrenal adenoma. No discrete left adrenal nodules. Normal kidneys with no hydronephrosis and no renal mass. Stomach/Bowel: Grossly normal stomach. Visualized small and large bowel is normal caliber, with no bowel wall thickening. Vascular/Lymphatic: Atherosclerotic nonaneurysmal abdominal aorta. Patent portal, splenic, hepatic and renal veins. No pathologically enlarged lymph nodes in the abdomen. Other: No pneumoperitoneum, ascites or focal fluid collection. Musculoskeletal: No aggressive appearing focal osseous lesions. Mild lumbar spondylosis. IMPRESSION: 1. Mixed interval response, although overall decreased tumor burden. Interval growth of a bulky AP window nodal metastasis. Otherwise decreased bilateral supraclavicular and right paratracheal adenopathy. Stable left upper lobe pulmonary nodule with evolving postradiation change. No new sites of metastatic disease in the chest or abdomen. 2. Aortic Atherosclerosis (ICD10-I70.0) and Emphysema (ICD10-J43.9). Electronically Signed   By: Ilona Sorrel M.D.   On: 12/24/2016 21:01   Ct Abdomen W Contrast  Result Date: 12/24/2016 CLINICAL DATA:  Extensive stage small cell left lung carcinoma diagnosed July 2017 with ongoing systemic chemotherapy. History of cranial irradiation  and palliative radiotherapy to recurrent left upper lobe pulmonary nodule. Restaging. EXAM: CT CHEST AND ABDOMEN WITH CONTRAST TECHNIQUE: Multidetector CT imaging of the chest and abdomen was performed following the standard protocol during bolus administration of intravenous contrast. CONTRAST:  174mL ISOVUE-300 IOPAMIDOL (ISOVUE-300) INJECTION 61% COMPARISON:  10/11/2016 CT chest, abdomen and pelvis. FINDINGS: CT CHEST FINDINGS Cardiovascular: Normal  heart size. No significant pericardial fluid/thickening. Atherosclerotic nonaneurysmal thoracic aorta. Normal caliber pulmonary arteries. No central pulmonary emboli. Mediastinum/Nodes: No discrete thyroid nodules. Unremarkable esophagus. No axillary adenopathy. Enlarged heterogeneous 1.9 cm left supraclavicular node (series 2/image 6), previously 3.2 cm, decreased. Nonenlarged 0.5 cm right supraclavicular node (series 2/ image 6), previously 1.1 cm, decreased. Mildly enlarged 1.0 cm right paratracheal node (series 2/image 17), previously 1.5 cm, decreased. Enlarged heterogeneous 2.4 cm AP window node (series 2/ image 24), previously 2.0 cm, mildly increased. No new pathologically enlarged mediastinal nodes. No pathologically enlarged hilar nodes. Lungs/Pleura: No pneumothorax. No pleural effusion. Irregular 1.3 x 0.6 cm left upper lobe pulmonary nodule (series 7/ image 53), previously 1.3 x 0.6 cm, stable. Mildly increased curvilinear bandlike opacity in the anterior left upper lobe is compatible with evolving postradiation change. No acute consolidative airspace disease or new significant pulmonary nodules. Tiny peripheral apical right upper lobe 2 mm solid pulmonary nodule (series 7/ image 36) is stable since 01/07/2016 and considered benign. Mild paraseptal emphysema. Musculoskeletal: No aggressive appearing focal osseous lesions. Moderate thoracic spondylosis. CT ABDOMEN FINDINGS Hepatobiliary: Normal liver size. A 1.7 cm right liver dome mass (series 2/ image 86) demonstrates discontinuous peripheral nodular hyperenhancement and appears stable since 03/26/2016 CT, most suggestive of a benign hemangioma. No new liver lesions. Normal gallbladder with no radiopaque cholelithiasis. No biliary ductal dilatation. Pancreas: Normal, with no mass or duct dilation. Spleen: Normal size. No mass. Adrenals/Urinary Tract: Right adrenal 1.0 cm nodule is stable since 11/19/2015 PET-CT, where it was non FDG avid, most  compatible with a stable right adrenal adenoma. No discrete left adrenal nodules. Normal kidneys with no hydronephrosis and no renal mass. Stomach/Bowel: Grossly normal stomach. Visualized small and large bowel is normal caliber, with no bowel wall thickening. Vascular/Lymphatic: Atherosclerotic nonaneurysmal abdominal aorta. Patent portal, splenic, hepatic and renal veins. No pathologically enlarged lymph nodes in the abdomen. Other: No pneumoperitoneum, ascites or focal fluid collection. Musculoskeletal: No aggressive appearing focal osseous lesions. Mild lumbar spondylosis. IMPRESSION: 1. Mixed interval response, although overall decreased tumor burden. Interval growth of a bulky AP window nodal metastasis. Otherwise decreased bilateral supraclavicular and right paratracheal adenopathy. Stable left upper lobe pulmonary nodule with evolving postradiation change. No new sites of metastatic disease in the chest or abdomen. 2. Aortic Atherosclerosis (ICD10-I70.0) and Emphysema (ICD10-J43.9). Electronically Signed   By: Ilona Sorrel M.D.   On: 12/24/2016 21:01   Dg Finger Ring Right  Result Date: 12/18/2016 CLINICAL DATA:  Pain and redness surround nail bed. EXAM: RIGHT RING FINGER 2+V COMPARISON:  None. FINDINGS: No fracture. No subluxation or dislocation. No definite bony destruction to suggest osteomyelitis. IMPRESSION: No gross bony destruction to suggest osteomyelitis by x-ray. Electronically Signed   By: Misty Stanley M.D.   On: 12/18/2016 17:26    ASSESSMENT AND PLAN:  This is a very pleasant 61 years old white female with extensive stage small cell lung cancer status post 6 cycles of systemic chemotherapy with cisplatin and etoposide followed by prophylactic cranial irradiation followed by palliative radiotherapy to a left upper lobe pulmonary nodule followed by stereotactic radiotherapy to solitary brain metastasis. This was  followed by disease progression with new bulky left supraclavicular  lymphadenopathy as well as new enlarged mediastinal left hilar lymph nodes. The patient was started on systemic chemotherapy with cisplatin 30 MG/M2 and irinotecan 65 MG/M2 on days 1 and 8 every 3 weeks status post 3 cycles. She tolerated her treatment fairly well except for neutropenia as well as intermittent nausea. She had repeat CT scan of the chest, abdomen and pelvis performed recently. Her scan showed improvement of the disease in the supraclavicular lymph node as well as mediastinum but there was enlarging left hilar lymph node. I personally and independently reviewed the scan images and discuss result with the patient and her family. I recommended for her to see Dr. Sondra Come for consideration of palliative radiotherapy to the enlarging left hilar lymph node as well as the persistent supraclavicular lymphadenopathy. I will delay the start of cycle #4 by 4 weeks until completion of her treatment with radiation and also to give her more time for bone marrow recovery. We will arrange for the patient to have Port-A-Cath placed in 2-3 weeks before resuming her systemic chemotherapy. The patient was advised to call immediately if she has any concerning symptoms in the interval. The patient voices understanding of current disease status and treatment options and is in agreement with the current care plan. All questions were answered. The patient knows to call the clinic with any problems, questions or concerns. We can certainly see the patient much sooner if necessary.  Disclaimer: This note was dictated with voice recognition software. Similar sounding words can inadvertently be transcribed and may not be corrected upon review.

## 2016-12-30 ENCOUNTER — Ambulatory Visit: Payer: 59

## 2016-12-30 ENCOUNTER — Other Ambulatory Visit: Payer: Self-pay | Admitting: Radiation Therapy

## 2016-12-30 ENCOUNTER — Encounter: Payer: 59 | Admitting: Nutrition

## 2016-12-30 DIAGNOSIS — C7949 Secondary malignant neoplasm of other parts of nervous system: Principal | ICD-10-CM

## 2016-12-30 DIAGNOSIS — C7931 Secondary malignant neoplasm of brain: Secondary | ICD-10-CM

## 2016-12-31 NOTE — Progress Notes (Signed)
Histology and Location of Primary Cancer: Extensive stage (T2b, N2, M1a) small cell lung cancer    Location(s) of Symptomatic tumor(s): enlarging left hilar lymph node as well as persistent supraclavicular lymphadenopathy  Past/Anticipated chemotherapy by medical oncology, if any: Systemic chemotherapy with cisplatin 30 MG/M2 and irinotecan 65 MG/M2 on days 1 and 8 every 3 weeks. First dose 10/21/2016. Status post 3 cycles.  SAFETY ISSUES:  Prior radiation? 05/04/16-05/18/16 whole brain 25 Gy in 10 fractions, 09/01/16 - 09/21/16 Left Lung treated to 35 Gy in 14 fractions, 10/07/2016 [facility]   Pacemaker/ICD? no  Possible current pregnancy? no  Is the patient on methotrexate? No   Additional Complaints / other details:  Patient denies feeling short of breath.  She reports having a cough with clear sputum.  She is here today with her friend.  BP 129/84 (BP Location: Right Arm, Patient Position: Sitting)   Pulse (!) 110   Temp 98.2 F (36.8 C) (Oral)   Ht 5\' 6"  (1.676 m)   Wt 205 lb 6.4 oz (93.2 kg)   SpO2 99%   BMI 33.15 kg/m    Wt Readings from Last 3 Encounters:  01/04/17 205 lb 6.4 oz (93.2 kg)  12/29/16 209 lb 14.4 oz (95.2 kg)  12/18/16 212 lb (96.2 kg)

## 2017-01-04 ENCOUNTER — Ambulatory Visit
Admission: RE | Admit: 2017-01-04 | Discharge: 2017-01-04 | Disposition: A | Discharge status: Home / Self Care | Payer: 59 | Source: Ambulatory Visit | Attending: Radiation Oncology | Admitting: Radiation Oncology

## 2017-01-04 ENCOUNTER — Encounter: Payer: Self-pay | Admitting: Radiation Oncology

## 2017-01-04 VITALS — BP 129/84 | HR 110 | Temp 98.2°F | Ht 66.0 in | Wt 205.4 lb

## 2017-01-04 DIAGNOSIS — Z510 Encounter for antineoplastic radiation therapy: Secondary | ICD-10-CM | POA: Diagnosis not present

## 2017-01-04 DIAGNOSIS — R569 Unspecified convulsions: Secondary | ICD-10-CM

## 2017-01-04 DIAGNOSIS — Z853 Personal history of malignant neoplasm of breast: Secondary | ICD-10-CM | POA: Diagnosis not present

## 2017-01-04 DIAGNOSIS — Z87891 Personal history of nicotine dependence: Secondary | ICD-10-CM | POA: Diagnosis not present

## 2017-01-04 DIAGNOSIS — Z9221 Personal history of antineoplastic chemotherapy: Secondary | ICD-10-CM | POA: Diagnosis not present

## 2017-01-04 DIAGNOSIS — J449 Chronic obstructive pulmonary disease, unspecified: Secondary | ICD-10-CM | POA: Diagnosis not present

## 2017-01-04 DIAGNOSIS — Z923 Personal history of irradiation: Secondary | ICD-10-CM | POA: Diagnosis not present

## 2017-01-04 DIAGNOSIS — C3492 Malignant neoplasm of unspecified part of left bronchus or lung: Secondary | ICD-10-CM

## 2017-01-04 DIAGNOSIS — C7931 Secondary malignant neoplasm of brain: Secondary | ICD-10-CM

## 2017-01-04 DIAGNOSIS — L89323 Pressure ulcer of left buttock, stage 3: Secondary | ICD-10-CM | POA: Diagnosis not present

## 2017-01-04 DIAGNOSIS — Z85110 Personal history of malignant carcinoid tumor of bronchus and lung: Secondary | ICD-10-CM | POA: Diagnosis not present

## 2017-01-04 NOTE — Progress Notes (Signed)
Radiation Oncology         [phone] ________________________________  Name: Sharon [name] MRN: [id number]  Date: 01/04/2017  DOB: 12/17/1955  Re-evaluation Note  CC: [name], PA  Curt Bears, MD    ICD-10-CM   1. Brain metastasis ([facility]) C79.31   2. Primary small cell carcinoma of left lung ([name]) C34.92   3. Seizure ([place]) R56.9     Diagnosis:  Extensive stage (T2b, N2, M1a) small cell lung cancer presented with large left upper lobe lung mass and mediastinal lymphadenopathy as well as malignant left pleural effusion diagnosed in July 2017.  PRIOR THERAPY:  1) Systemic chemotherapy with cisplatin 60 MG/M2 on day 1 and etoposide at 120 MG/M2 on days 1, 2 and 3 with Neulasta support on day 4. She is status post 6 cycles. 2) prophylactic cranial irradiation completed on 05/18/2016. (25 Gy in 10 fractions) 3) palliative radiotherapy to the enlarging left upper lobe lung nodule  35 Gy in 14 fractions, completed 09/21/2016) 4) stereotactic radiotherapy to a solitary brain metastasis on 10/07/2016. 5) the right breast received 50.4 gray in 28 fractions with a boost to the lumpectomy cavity to 63 gray. The patient's breast cancer treatments were in November 2009 through January 20 of 2010.  CURRENT THERAPY: Systemic chemotherapy with cisplatin 30 MG/M2 and irinotecan 65 MG/M2 on days 1 and 8 every 3 weeks. First dose 10/21/2016. Status post 3 cycles.      Narrative:    Sharon [name] 61 y.o. female returns to the clinic today at the courtesy of Dr. Julien Nordmann for further evaluation and consideration for additional palliative radiation therapy. The patient is feeling fine today except for fatigue. She missed day 8 of the last cycle because of significant neutropenia. She will receive Neulasta injection at that time. She denied having any current chest pain, shortness of breath, cough or hemoptysis. She denied having any recent weight loss or night sweats. The patient denied having  any current nausea or vomiting, diarrhea or constipation. She denied having any fever or chills. She had repeat CT scan of the chest, abdomen and pelvis performed recently.  ALLERGIES:  has No Known Allergies.  Meds: Current Outpatient Prescriptions  Medication Sig Dispense Refill  . albuterol (PROVENTIL HFA;VENTOLIN HFA) 108 (90 Base) MCG/ACT inhaler Inhale 1 puff into the lungs every 6 (six) hours as needed for wheezing or shortness of breath.    . budesonide-formoterol (SYMBICORT) 160-4.5 MCG/ACT inhaler Inhale 2 puffs into the lungs 2 (two) times daily. 1 Inhaler 11  . ferrous sulfate 325 (65 FE) MG EC tablet Take 325 mg by mouth daily.    . fluticasone (FLONASE) 50 MCG/ACT nasal spray Place 1 spray into both nostrils daily.     . Ipratropium-Albuterol (COMBIVENT RESPIMAT) 20-100 MCG/ACT AERS respimat Inhale 1 puff into the lungs every 6 (six) hours.    Marland Kitchen loperamide (IMODIUM) 2 MG capsule Take by mouth as needed for diarrhea or loose stools.    . magnesium oxide (MAG-OX) 400 (241.3 Mg) MG tablet Take 1 tablet (400 mg total) by mouth 3 (three) times daily. 90 tablet 0  . montelukast (SINGULAIR) 10 MG tablet Take 10 mg by mouth at bedtime.   2  . polyethylene glycol powder (GLYCOLAX/MIRALAX) powder Take 1 Container by mouth daily.    . prochlorperazine (COMPAZINE) 10 MG tablet TAKE 1 TABLET BY MOUTH EVERY 6 HOURS AS NEEDED FOR NAUSEA AND VOMITING 360 tablet 1  . SANTYL ointment APP TOPICALLY UTD  0  .  UNABLE TO FIND Take 10 mLs by mouth daily as needed. Med Name: CBD Oil    . famotidine (PEPCID) 20 MG tablet Take 1 tablet (20 mg total) by mouth daily. Take pepcid 30 minutes prior to kdur dose. (Patient not taking: Reported on 01/04/2017) 30 tablet 0  . LORazepam (ATIVAN) 0.5 MG tablet 1 tablet po 30 minutes prior to radiation or MRI (Patient not taking: Reported on 12/29/2016) 30 tablet 0  . meclizine (ANTIVERT) 12.5 MG tablet Take 1-2 tablets (12.5-25 mg total) by mouth 3 (three) times daily as  needed for dizziness. (Patient not taking: Reported on 12/29/2016) 20 tablet 0  . senna (SENOKOT) 8.6 MG TABS tablet Take 1 tablet (8.6 mg total) by mouth daily. (Patient not taking: Reported on 12/29/2016) 30 tablet 0   No current facility-administered medications for this encounter.     Physical Findings: The patient is in no acute distress. Patient is alert and oriented. Accompanied by good friend from [place] today  height is 5\' 6"  (1.676 m) and weight is 205 lb 6.4 oz (93.2 kg). Her oral temperature is 98.2 F (36.8 C). Her blood pressure is 129/84 and her pulse is 110 (abnormal). Her oxygen saturation is 99%. .  No palpable supraclavicular the lungs are clear to auscultation. The heart has a regular rhythm and rate. The neurological exam is nonfocal  Lab Findings: Lab Results  Component Value Date   WBC 6.5 12/29/2016   HGB 8.5 (L) 12/29/2016   HCT 26.7 (L) 12/29/2016   MCV 98.5 12/29/2016   PLT 88 (L) 12/29/2016    Radiographic Findings: Ct Chest W Contrast  Result Date: 12/24/2016 CLINICAL DATA:  Extensive stage small cell left lung carcinoma diagnosed July 2017 with ongoing systemic chemotherapy. History of cranial irradiation and palliative radiotherapy to recurrent left upper lobe pulmonary nodule. Restaging. EXAM: CT CHEST AND ABDOMEN WITH CONTRAST TECHNIQUE: Multidetector CT imaging of the chest and abdomen was performed following the standard protocol during bolus administration of intravenous contrast. CONTRAST:  166mL ISOVUE-300 IOPAMIDOL (ISOVUE-300) INJECTION 61% COMPARISON:  10/11/2016 CT chest, abdomen and pelvis. FINDINGS: CT CHEST FINDINGS Cardiovascular: Normal heart size. No significant pericardial fluid/thickening. Atherosclerotic nonaneurysmal thoracic aorta. Normal caliber pulmonary arteries. No central pulmonary emboli. Mediastinum/Nodes: No discrete thyroid nodules. Unremarkable esophagus. No axillary adenopathy. Enlarged heterogeneous 1.9 cm left supraclavicular node  (series 2/image 6), previously 3.2 cm, decreased. Nonenlarged 0.5 cm right supraclavicular node (series 2/ image 6), previously 1.1 cm, decreased. Mildly enlarged 1.0 cm right paratracheal node (series 2/image 17), previously 1.5 cm, decreased. Enlarged heterogeneous 2.4 cm AP window node (series 2/ image 24), previously 2.0 cm, mildly increased. No new pathologically enlarged mediastinal nodes. No pathologically enlarged hilar nodes. Lungs/Pleura: No pneumothorax. No pleural effusion. Irregular 1.3 x 0.6 cm left upper lobe pulmonary nodule (series 7/ image 53), previously 1.3 x 0.6 cm, stable. Mildly increased curvilinear bandlike opacity in the anterior left upper lobe is compatible with evolving postradiation change. No acute consolidative airspace disease or new significant pulmonary nodules. Tiny peripheral apical right upper lobe 2 mm solid pulmonary nodule (series 7/ image 36) is stable since 01/07/2016 and considered benign. Mild paraseptal emphysema. Musculoskeletal: No aggressive appearing focal osseous lesions. Moderate thoracic spondylosis. CT ABDOMEN FINDINGS Hepatobiliary: Normal liver size. A 1.7 cm right liver dome mass (series 2/ image 68) demonstrates discontinuous peripheral nodular hyperenhancement and appears stable since 03/26/2016 CT, most suggestive of a benign hemangioma. No new liver lesions. Normal gallbladder with no radiopaque cholelithiasis. No biliary ductal dilatation.  Pancreas: Normal, with no mass or duct dilation. Spleen: Normal size. No mass. Adrenals/Urinary Tract: Right adrenal 1.0 cm nodule is stable since 11/19/2015 PET-CT, where it was non FDG avid, most compatible with a stable right adrenal adenoma. No discrete left adrenal nodules. Normal kidneys with no hydronephrosis and no renal mass. Stomach/Bowel: Grossly normal stomach. Visualized small and large bowel is normal caliber, with no bowel wall thickening. Vascular/Lymphatic: Atherosclerotic nonaneurysmal abdominal aorta.  Patent portal, splenic, hepatic and renal veins. No pathologically enlarged lymph nodes in the abdomen. Other: No pneumoperitoneum, ascites or focal fluid collection. Musculoskeletal: No aggressive appearing focal osseous lesions. Mild lumbar spondylosis. IMPRESSION: 1. Mixed interval response, although overall decreased tumor burden. Interval growth of a bulky AP window nodal metastasis. Otherwise decreased bilateral supraclavicular and right paratracheal adenopathy. Stable left upper lobe pulmonary nodule with evolving postradiation change. No new sites of metastatic disease in the chest or abdomen. 2. Aortic Atherosclerosis (ICD10-I70.0) and Emphysema (ICD10-J43.9). Electronically Signed   By: Ilona Sorrel M.D.   On: 12/24/2016 21:01   Ct Abdomen W Contrast  Result Date: 12/24/2016 CLINICAL DATA:  Extensive stage small cell left lung carcinoma diagnosed July 2017 with ongoing systemic chemotherapy. History of cranial irradiation and palliative radiotherapy to recurrent left upper lobe pulmonary nodule. Restaging. EXAM: CT CHEST AND ABDOMEN WITH CONTRAST TECHNIQUE: Multidetector CT imaging of the chest and abdomen was performed following the standard protocol during bolus administration of intravenous contrast. CONTRAST:  154mL ISOVUE-300 IOPAMIDOL (ISOVUE-300) INJECTION 61% COMPARISON:  10/11/2016 CT chest, abdomen and pelvis. FINDINGS: CT CHEST FINDINGS Cardiovascular: Normal heart size. No significant pericardial fluid/thickening. Atherosclerotic nonaneurysmal thoracic aorta. Normal caliber pulmonary arteries. No central pulmonary emboli. Mediastinum/Nodes: No discrete thyroid nodules. Unremarkable esophagus. No axillary adenopathy. Enlarged heterogeneous 1.9 cm left supraclavicular node (series 2/image 6), previously 3.2 cm, decreased. Nonenlarged 0.5 cm right supraclavicular node (series 2/ image 6), previously 1.1 cm, decreased. Mildly enlarged 1.0 cm right paratracheal node (series 2/image 17),  previously 1.5 cm, decreased. Enlarged heterogeneous 2.4 cm AP window node (series 2/ image 24), previously 2.0 cm, mildly increased. No new pathologically enlarged mediastinal nodes. No pathologically enlarged hilar nodes. Lungs/Pleura: No pneumothorax. No pleural effusion. Irregular 1.3 x 0.6 cm left upper lobe pulmonary nodule (series 7/ image 53), previously 1.3 x 0.6 cm, stable. Mildly increased curvilinear bandlike opacity in the anterior left upper lobe is compatible with evolving postradiation change. No acute consolidative airspace disease or new significant pulmonary nodules. Tiny peripheral apical right upper lobe 2 mm solid pulmonary nodule (series 7/ image 36) is stable since 01/07/2016 and considered benign. Mild paraseptal emphysema. Musculoskeletal: No aggressive appearing focal osseous lesions. Moderate thoracic spondylosis. CT ABDOMEN FINDINGS Hepatobiliary: Normal liver size. A 1.7 cm right liver dome mass (series 2/ image 18) demonstrates discontinuous peripheral nodular hyperenhancement and appears stable since 03/26/2016 CT, most suggestive of a benign hemangioma. No new liver lesions. Normal gallbladder with no radiopaque cholelithiasis. No biliary ductal dilatation. Pancreas: Normal, with no mass or duct dilation. Spleen: Normal size. No mass. Adrenals/Urinary Tract: Right adrenal 1.0 cm nodule is stable since 11/19/2015 PET-CT, where it was non FDG avid, most compatible with a stable right adrenal adenoma. No discrete left adrenal nodules. Normal kidneys with no hydronephrosis and no renal mass. Stomach/Bowel: Grossly normal stomach. Visualized small and large bowel is normal caliber, with no bowel wall thickening. Vascular/Lymphatic: Atherosclerotic nonaneurysmal abdominal aorta. Patent portal, splenic, hepatic and renal veins. No pathologically enlarged lymph nodes in the abdomen. Other: No pneumoperitoneum, ascites or  focal fluid collection. Musculoskeletal: No aggressive appearing focal  osseous lesions. Mild lumbar spondylosis. IMPRESSION: 1. Mixed interval response, although overall decreased tumor burden. Interval growth of a bulky AP window nodal metastasis. Otherwise decreased bilateral supraclavicular and right paratracheal adenopathy. Stable left upper lobe pulmonary nodule with evolving postradiation change. No new sites of metastatic disease in the chest or abdomen. 2. Aortic Atherosclerosis (ICD10-I70.0) and Emphysema (ICD10-J43.9). Electronically Signed   By: Ilona Sorrel M.D.   On: 12/24/2016 21:01   Dg Finger Ring Right  Result Date: 12/18/2016 CLINICAL DATA:  Pain and redness surround nail bed. EXAM: RIGHT RING FINGER 2+V COMPARISON:  None. FINDINGS: No fracture. No subluxation or dislocation. No definite bony destruction to suggest osteomyelitis. IMPRESSION: No gross bony destruction to suggest osteomyelitis by x-ray. Electronically Signed   By: Misty Stanley M.D.   On: 12/18/2016 17:26    Impression:  Extensive stage (T2b, N2, M1a) small cell lung cancer presented with large left upper lobe lung mass and mediastinal lymphadenopathy as well as malignant left pleural effusion diagnosed in July 2017. Recent imaging studies show an mixed interval response with overall decreased tumor burden however a interval growth of a bulky AP window lymph node. Patient also has a significant left supraclavicular lymph node. I would recommend palliative radiation therapy to these areas for more durable control. I discussed course of treatment side effects and potential toxicities of radiation therapy in this situation with the patient and her friend. She appears to understand and wishes to proceed with planned course of treatment.  Plan:  Patient will undergo simulation and planning later today and start her treatments early next week. Anticipate 35 gray in 14 fractions directed at the left supraclavicular lymph node as well as the left AP window lymph node. I am recommending intensity  modulated radiation therapy to  cover the areas of involvement and to limit dose to normal surrounding critical structures and overlap in light of her previous left lung radiation therapy and previous right breast radiation therapy.  ____________________________________ Gery Pray, MD

## 2017-01-04 NOTE — Progress Notes (Signed)
Please see the Nurse Progress Note in the MD Initial Consult Encounter for this patient. 

## 2017-01-04 NOTE — Progress Notes (Signed)
  Radiation Oncology         [phone] ________________________________  Name: Sharon [name] MRN: [id number]  Date: 01/04/2017  DOB: 1955-08-10  SIMULATION AND TREATMENT PLANNING NOTE    ICD-10-CM   1. Primary small cell carcinoma of left lung ([name]) C34.92     DIAGNOSIS:  Recurrent small cell lung cancer with bulky left supraclavicular and AP window adenopathy  NARRATIVE:  The patient was brought to the [facility].  Identity was confirmed.  All relevant records and images related to the planned course of therapy were reviewed.  The patient freely provided informed written consent to proceed with treatment after reviewing the details related to the planned course of therapy. The consent form was witnessed and verified by the simulation staff.  Then, the patient was set-up in a stable reproducible  supine position for radiation therapy.  CT images were obtained.  Surface markings were placed.  The CT images were loaded into the planning software.  Then the target and avoidance structures were contoured.  Treatment planning then occurred.  The radiation prescription was entered and confirmed.  Then, I designed and supervised the construction of a total of 2 medically necessary complex treatment devices.  I have requested : Intensity Modulated Radiotherapy (IMRT) is medically necessary for this case for the following reason:  Previous treatment to this area..  I have ordered:dose calc.  PLAN:  The patient will receive 35 Gy in 14 fractions.  -----------------------------------  Blair Promise, PhD, MD

## 2017-01-05 ENCOUNTER — Telehealth: Payer: Self-pay | Admitting: *Deleted

## 2017-01-05 NOTE — Telephone Encounter (Signed)
We cannot share any information with Lattie Haw.

## 2017-01-05 NOTE — Telephone Encounter (Signed)
FYI Voicemail: "This is Lattie Haw a friend of Logan Febles.  Calling for Dr. Worthy Flank nurse D___.  I have a couple questions for you if you could call me when you have a chance, [phone]."   No further caller identification information received with this call.  Ms. Riner seen last on 12-29-2016 with next scheduled F/U 01-26-2017.   Routing call information to collaborative nurse and provider for review.  Further patient communication through collaborative nurse.

## 2017-01-06 ENCOUNTER — Ambulatory Visit: Payer: 59

## 2017-01-06 ENCOUNTER — Other Ambulatory Visit: Payer: 59

## 2017-01-06 DIAGNOSIS — Z510 Encounter for antineoplastic radiation therapy: Secondary | ICD-10-CM | POA: Diagnosis not present

## 2017-01-09 ENCOUNTER — Emergency Department (HOSPITAL_BASED_OUTPATIENT_CLINIC_OR_DEPARTMENT_OTHER): Payer: 59

## 2017-01-09 ENCOUNTER — Emergency Department (HOSPITAL_BASED_OUTPATIENT_CLINIC_OR_DEPARTMENT_OTHER)
Admission: EM | Admit: 2017-01-09 | Discharge: 2017-01-10 | Disposition: A | Discharge status: Home / Self Care | Payer: 59 | Attending: Emergency Medicine | Admitting: Emergency Medicine

## 2017-01-09 ENCOUNTER — Encounter (HOSPITAL_BASED_OUTPATIENT_CLINIC_OR_DEPARTMENT_OTHER): Payer: Self-pay | Admitting: Emergency Medicine

## 2017-01-09 ENCOUNTER — Other Ambulatory Visit: Payer: Self-pay

## 2017-01-09 DIAGNOSIS — J449 Chronic obstructive pulmonary disease, unspecified: Secondary | ICD-10-CM | POA: Diagnosis not present

## 2017-01-09 DIAGNOSIS — R05 Cough: Secondary | ICD-10-CM

## 2017-01-09 DIAGNOSIS — C719 Malignant neoplasm of brain, unspecified: Secondary | ICD-10-CM | POA: Diagnosis not present

## 2017-01-09 DIAGNOSIS — R059 Cough, unspecified: Secondary | ICD-10-CM

## 2017-01-09 DIAGNOSIS — C3490 Malignant neoplasm of unspecified part of unspecified bronchus or lung: Secondary | ICD-10-CM

## 2017-01-09 DIAGNOSIS — J9809 Other diseases of bronchus, not elsewhere classified: Secondary | ICD-10-CM

## 2017-01-09 DIAGNOSIS — Z87891 Personal history of nicotine dependence: Secondary | ICD-10-CM | POA: Insufficient documentation

## 2017-01-09 DIAGNOSIS — Y818 Miscellaneous general- and plastic-surgery devices associated with adverse incidents, not elsewhere classified: Secondary | ICD-10-CM | POA: Insufficient documentation

## 2017-01-09 DIAGNOSIS — T80818A Extravasation of other vesicant agent, initial encounter: Secondary | ICD-10-CM | POA: Diagnosis not present

## 2017-01-09 DIAGNOSIS — R591 Generalized enlarged lymph nodes: Secondary | ICD-10-CM | POA: Diagnosis not present

## 2017-01-09 DIAGNOSIS — Z853 Personal history of malignant neoplasm of breast: Secondary | ICD-10-CM | POA: Insufficient documentation

## 2017-01-09 DIAGNOSIS — I10 Essential (primary) hypertension: Secondary | ICD-10-CM | POA: Diagnosis not present

## 2017-01-09 DIAGNOSIS — R0602 Shortness of breath: Secondary | ICD-10-CM | POA: Diagnosis not present

## 2017-01-09 LAB — COMPREHENSIVE METABOLIC PANEL
ALBUMIN: 2.5 g/dL — AB (ref 3.5–5.0)
ALK PHOS: 82 U/L (ref 38–126)
ALT: 14 U/L (ref 14–54)
AST: 20 U/L (ref 15–41)
Anion gap: 7 (ref 5–15)
BILIRUBIN TOTAL: 0.5 mg/dL (ref 0.3–1.2)
BUN: 14 mg/dL (ref 6–20)
CO2: 28 mmol/L (ref 22–32)
CREATININE: 0.76 mg/dL (ref 0.44–1.00)
Calcium: 8.9 mg/dL (ref 8.9–10.3)
Chloride: 98 mmol/L — ABNORMAL LOW (ref 101–111)
GFR calc Af Amer: >60 mL/min (ref >60)
GLUCOSE: 99 mg/dL (ref 65–99)
POTASSIUM: 3.6 mmol/L (ref 3.5–5.1)
Sodium: 133 mmol/L — ABNORMAL LOW (ref 135–145)
TOTAL PROTEIN: 6.9 g/dL (ref 6.5–8.1)

## 2017-01-09 LAB — CBC WITH DIFFERENTIAL/PLATELET
BASOS ABS: 0 10*3/uL (ref 0.0–0.1)
BASOS PCT: 0 %
Eosinophils Absolute: 0.1 10*3/uL (ref 0.0–0.7)
Eosinophils Relative: 1 %
HCT: 23.7 % — ABNORMAL LOW (ref 36.0–46.0)
HEMOGLOBIN: 7.6 g/dL — AB (ref 12.0–15.0)
LYMPHS PCT: 15 %
Lymphs Abs: 1.4 10*3/uL (ref 0.7–4.0)
MCH: 32.3 pg (ref 26.0–34.0)
MCHC: 32.1 g/dL (ref 30.0–36.0)
MCV: 100.9 fL — AB (ref 78.0–100.0)
Monocytes Absolute: 1.5 10*3/uL — ABNORMAL HIGH (ref 0.1–1.0)
Monocytes Relative: 15 %
NEUTROS ABS: 6.8 10*3/uL (ref 1.7–7.7)
NEUTROS PCT: 69 %
Platelets: 311 10*3/uL (ref 150–400)
RBC: 2.35 MIL/uL — AB (ref 3.87–5.11)
RDW: 17.5 % — ABNORMAL HIGH (ref 11.5–15.5)
WBC: 9.8 10*3/uL (ref 4.0–10.5)

## 2017-01-09 LAB — D-DIMER, QUANTITATIVE: D-Dimer, Quant: 1.44 ug/mL-FEU — ABNORMAL HIGH (ref 0.00–0.50)

## 2017-01-09 LAB — TROPONIN I: Troponin I: <0.03 ng/mL (ref <0.03)

## 2017-01-09 MED ORDER — SODIUM CHLORIDE 0.9 % IV BOLUS (SEPSIS)
500.0000 mL | Freq: Once | INTRAVENOUS | Status: AC
  Administered 2017-01-09: 500 mL via INTRAVENOUS

## 2017-01-09 MED ORDER — IOPAMIDOL (ISOVUE-370) INJECTION 76%
100.0000 mL | Freq: Once | INTRAVENOUS | Status: AC | PRN
  Administered 2017-01-09: 100 mL via INTRAVENOUS

## 2017-01-09 NOTE — ED Notes (Signed)
Patient transported to CT 

## 2017-01-09 NOTE — ED Notes (Signed)
Ice pack removed. No changes to skin on RUE in area of extravasation.

## 2017-01-09 NOTE — ED Triage Notes (Signed)
Patient reports that she has had a productive cough x 1 - 2 weeks. Patient also loosing her voice.

## 2017-01-09 NOTE — ED Notes (Signed)
ED Provider at bedside. 

## 2017-01-09 NOTE — ED Notes (Signed)
Pt in CT.

## 2017-01-09 NOTE — ED Notes (Signed)
Attempted IV access x 2 without success.

## 2017-01-09 NOTE — ED Provider Notes (Signed)
[facility] DEPT [facility] Provider Note   CSN: [id number] Arrival date & time: 01/09/17  1657     History   Chief Complaint Chief Complaint  Patient presents with  . Cough    HPI Sharon [name] is a 61 y.o. female.  HPI   Presents with concern for cough since Tuesday, less then a week, productive of clear mucus. No blood or green mucus.  Shortness of breath, for years, however friend notes she has been more short of breath with conversation then she was before-that she now needs to pause to breath when she is speaking. No fevers.  No nausea or vomiting, takes nausea pills for chemo. No abdominal pain, diarrhea now but did a few days ago and id resolved with imodium.  Chest pain with cough. Frankincence is helping with back pain with cough.  Losing voice for a few days. No sore throat, no difficult swallowing.    8/25 had chemo infusion  Past Medical History:  Diagnosis Date  . Cancer of right breast ([place]) 2009  . CAP (community acquired pneumonia) 11/06/2015  . Chemotherapy induced neutropenia ([place]) 12/17/2016  . COPD (chronic obstructive pulmonary disease) ([place])   . Elevated blood pressure   . Encounter for antineoplastic chemotherapy 01/05/2016  . History of radiation therapy 05/04/16-05/18/16   whole brain 25 Gy in 10 fractions  . History of radiation therapy 09/01/16 - 09/21/16   Left Lung treated to 35 Gy in 14 fractions  . Hoarseness of voice    "for the last 2 months" (11/06/2015)  . Hypertension   . Hypokalemia 12/17/2016  . Lung mass dx'd 10/2015  . Menopause   . Personal history of breast cancer 03/29/2016  . Pre-diabetes   . Small cell lung cancer ([place])   . Vitamin D deficiency     Patient Active Problem List   Diagnosis Date Noted  . Chemotherapy induced neutropenia ([place]) 12/17/2016  . Hypokalemia 12/17/2016  . Partial small bowel obstruction ([place]) 10/15/2016  . Brain tumor ([place])   . Seizure ([place]) 09/28/2016  . Brain metastasis ([place]) 09/27/2016  . Hemoptysis  08/17/2016  . Personal history of breast cancer 03/29/2016  . Encounter for antineoplastic chemotherapy 01/05/2016  . Nausea without vomiting 12/15/2015  . 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 Mediastinal adenopathy   . Breast cancer ([place]) 07/18/2012  . COPD, severe ([place]) 08/18/2011  . Smoking 08/18/2011    Past Surgical History:  Procedure Laterality Date  . BREAST BIOPSY Right 2009  . BREAST LUMPECTOMY Right 2009  . UTERINE FIBROID SURGERY  2000  . VAGINAL HYSTERECTOMY  2000  . VESICOVAGINAL FISTULA CLOSURE W/ TAH  2000  . VIDEO BRONCHOSCOPY WITH ENDOBRONCHIAL ULTRASOUND N/A 11/10/2015   Procedure: VIDEO BRONCHOSCOPY WITH ENDOBRONCHIAL ULTRASOUND;  Surgeon: Javier Glazier, MD;  Location: [facility];  Service: Thoracic;  Laterality: N/A;    OB History    No data available       Home Medications    Prior to Admission medications   Medication Sig Start Date End Date Taking? Authorizing Provider  albuterol (PROVENTIL HFA;VENTOLIN HFA) 108 (90 Base) MCG/ACT inhaler Inhale 1 puff into the lungs every 6 (six) hours as needed for wheezing or shortness of breath.    [provider]  budesonide-formoterol (SYMBICORT) 160-4.5 MCG/ACT inhaler Inhale 2 puffs into the lungs 2 (two) times daily. 08/17/16   Javier Glazier, MD  famotidine (PEPCID) 20 MG tablet Take 1 tablet (20 mg total) by mouth  daily. Take pepcid 30 minutes prior to kdur dose. Patient not taking: Reported on 01/04/2017 11/22/16   Curt Bears, MD  ferrous sulfate 325 (65 FE) MG EC tablet Take 325 mg by mouth daily.    [provider]  fluticasone (FLONASE) 50 MCG/ACT nasal spray Place 1 spray into both nostrils daily.  08/20/16   [provider]  Ipratropium-Albuterol (COMBIVENT RESPIMAT) 20-100 MCG/ACT AERS respimat Inhale 1 puff into the lungs every 6 (six) hours.    [provider]  loperamide (IMODIUM) 2 MG capsule Take by mouth as needed for diarrhea or  loose stools.    [provider]  LORazepam (ATIVAN) 0.5 MG tablet 1 tablet po 30 minutes prior to radiation or MRI Patient not taking: Reported on 12/29/2016 09/28/16   Hayden Pedro, PA-C  magnesium oxide (MAG-OX) 400 (241.3 Mg) MG tablet Take 1 tablet (400 mg total) by mouth 3 (three) times daily. 12/08/16   Owens Shark, NP  meclizine (ANTIVERT) 12.5 MG tablet Take 1-2 tablets (12.5-25 mg total) by mouth 3 (three) times daily as needed for dizziness. Patient not taking: Reported on 12/29/2016 06/05/16   Virgel Manifold, MD  montelukast (SINGULAIR) 10 MG tablet Take 10 mg by mouth at bedtime.  09/06/16   [provider]  polyethylene glycol powder (GLYCOLAX/MIRALAX) powder Take 1 Container by mouth daily.    [provider]  prochlorperazine (COMPAZINE) 10 MG tablet TAKE 1 TABLET BY MOUTH EVERY 6 HOURS AS NEEDED FOR NAUSEA AND VOMITING 03/12/16   Curt Bears, MD  SANTYL ointment APP TOPICALLY UTD 12/08/16   [provider]  senna (SENOKOT) 8.6 MG TABS tablet Take 1 tablet (8.6 mg total) by mouth daily. Patient not taking: Reported on 12/29/2016 10/17/16   Patrecia Pour, MD  UNABLE TO FIND Take 10 mLs by mouth daily as needed. Med Name: CBD Oil    [provider]    Family History Family History  Problem Relation Age of Onset  . Adopted: Yes  . Cancer Neg Hx     Social History Social History  Substance Use Topics  . Smoking status: Former Smoker    Packs/day: 0.10    Years: 46.00    Types: Cigarettes    Quit date: 11/11/2015  . Smokeless tobacco: Never Used     Comment: Peak rate of 2ppd  . Alcohol use No     Allergies   Patient has no known allergies.   Review of Systems Review of Systems  Constitutional: Negative for fever.  HENT: Positive for congestion and voice change. Negative for sore throat and trouble swallowing.   Eyes: Negative for visual disturbance.  Respiratory: Positive for cough and shortness of breath.     Cardiovascular: Negative for chest pain.  Gastrointestinal: Negative for abdominal pain, nausea and vomiting.  Genitourinary: Negative for difficulty urinating.  Musculoskeletal: Negative for back pain and neck pain.  Skin: Negative for rash.  Neurological: Negative for syncope and headaches.     Physical Exam Updated Vital Signs BP 127/61   Pulse 99   Temp 98.5 F (36.9 C) (Oral)   Resp 19   Ht 5\' 6"  (1.676 m)   Wt 93 kg (205 lb)   SpO2 94%   BMI 33.09 kg/m   Physical Exam  Constitutional: She is oriented to person, place, and time. She appears well-developed and well-nourished. No distress.  HENT:  Head: Normocephalic and atraumatic.  Eyes: Conjunctivae and EOM are normal.  Neck: Normal range of  motion.  Cardiovascular: Normal rate, regular rhythm, normal heart sounds and intact distal pulses.  Exam reveals no gallop and no friction rub.   No murmur heard. Pulmonary/Chest: Effort normal. No respiratory distress. She has wheezes (occasional). She has no rales.  Appears short of breath with conversation  Abdominal: Soft. She exhibits no distension. There is no tenderness. There is no guarding.  Musculoskeletal: She exhibits no edema or tenderness.  Neurological: She is alert and oriented to person, place, and time.  Skin: Skin is warm and dry. No rash noted. She is not diaphoretic. No erythema.  Nursing note and vitals reviewed.    ED Treatments / Results  Labs (all labs ordered are listed, but only abnormal results are displayed) Labs Reviewed  CBC WITH DIFFERENTIAL/PLATELET - Abnormal; Notable for the following:       Result Value   RBC 2.35 (*)    Hemoglobin 7.6 (*)    HCT 23.7 (*)    MCV 100.9 (*)    RDW 17.5 (*)    Monocytes Absolute 1.5 (*)    All other components within normal limits  COMPREHENSIVE METABOLIC PANEL - Abnormal; Notable for the following:    Sodium 133 (*)    Chloride 98 (*)    Albumin 2.5 (*)    All other components within normal limits   D-DIMER, QUANTITATIVE (NOT AT [facility]) - Abnormal; Notable for the following:    D-Dimer, Quant 1.44 (*)    All other components within normal limits  TROPONIN I    EKG  EKG Interpretation None       Radiology Dg Chest 2 View  Result Date: 01/09/2017 CLINICAL DATA:  Cough and congestion for 6 days. EXAM: CHEST  2 VIEW COMPARISON:  10/16/2016 FINDINGS: The cardiomediastinal silhouette is unremarkable. Left upper lung scarring again noted. There is no evidence of focal airspace disease, pulmonary edema, suspicious pulmonary nodule/mass, pleural effusion, or pneumothorax. No acute bony abnormalities are identified. IMPRESSION: No active cardiopulmonary disease. Electronically Signed   By: Margarette Canada M.D.   On: 01/09/2017 17:31   Ct Angio Chest Pe W And/or Wo Contrast  Result Date: 01/09/2017 CLINICAL DATA:  Productive cough for 2 weeks. History of metastatic breast cancer, lung mass, COPD. EXAM: CT ANGIOGRAPHY CHEST WITH CONTRAST TECHNIQUE: Multidetector CT imaging of the chest was performed using the standard protocol during bolus administration of intravenous contrast. Multiplanar CT image reconstructions and MIPs were obtained to evaluate the vascular anatomy. Contrast extravasation after initial 100 cc administered. CONTRAST:  80 cc Isovue 370. COMPARISON:  CT chest December 25, 1998 18 inch chest radiograph January 09, 2017 FINDINGS: CARDIOVASCULAR: Adequate contrast opacification of the pulmonary artery's. Main pulmonary artery is not enlarged. No pulmonary arterial filling defects to the level of the subsegmental branches. Heart size is normal, no right heart strain. No pericardial effusion. Thoracic aorta is normal course and caliber, unremarkable. MEDIASTINUM/NODES: LEFT supraclavicular 3.5 x 3.4 cm nodal conglomeration was 2.9 x 1.9 cm. RIGHT supraclavicular 9 mm short axis lymph node was 5 mm. Pretracheal 17 mm lymph node was 10 mm. Aortopulmonary window lymph node 2.8 x 3.9 cm nodal  conglomeration with 2.4 x 3 cm. Subcarinal 8 mm lymph node is now 20 mm. LUNGS/PLEURA: Bulky lymphadenopathy results in new severe stenosis LEFT mainstem bronchus with intermittent obscuration, presumably from post obstruction debris. Unchanged 5 x 14 mm parenchymal scarring LEFT upper lobe with pleural reticulonodular scarring. Similar lingular scarring. Increasing lingular nodular atelectasis. Worsening LEFT lower lobe atelectasis. Mild centrilobular  and paraseptal emphysema. UPPER ABDOMEN: Included view of the abdomen is nonacute. Intimal thickening results in approximately 50% stenosis distal thoracic aorta, unchanged. Mild calcific atherosclerosis of the aortic arch. Hepatic dome calcified granuloma. MUSCULOSKELETAL: Contrast within the included RIGHT proximal humerus soft tissues corresponding to known extravasation. No destructive bony lesions or acute osseous process. Review of the MIP images confirms the above findings. IMPRESSION: 1. No acute pulmonary embolism. 2. Rapid progression of mediastinal and supraclavicular nodal metastasis. New compression LEFT mainstem bronchus resulting in severe stenosis and presumable post obstruction debris. 3. Increasing LEFT lower lobe atelectasis and increasing lingular nodular atelectasis, possible metastasis. Stable LEFT upper lobe scarring/ postradiation change. 4. RIGHT upper extremity contrast extravasation. Standard contrast extravasation protocol initiated. 5. Acute findings discussed with and reconfirmed by Dr.Aylanie Cubillos on 01/09/2017 at 10:20 pm. Aortic Atherosclerosis (ICD10-I70.0) and Emphysema (ICD10-J43.9). Electronically Signed   By: Elon Alas M.D.   On: 01/09/2017 22:25   Dg Humerus Right  Result Date: 01/09/2017 CLINICAL DATA:  IV contrast infiltration. EXAM: RIGHT HUMERUS - 2+ VIEW COMPARISON:  None. FINDINGS: Extravasated contrast is seen tracking along the ventral aspect of the right arm from the elbow to axilla. This obscures fine bony  detail on the AP view of the distal humerus. No acute osseous appearing abnormality is seen. A total of 100 cc of IV contrast was infiltrated. IMPRESSION: Extravasated intravenous contrast from CT is noted along the ventral aspect of the right arm. No acute osseous appearing abnormality. Electronically Signed   By: Ashley Royalty M.D.   On: 01/09/2017 22:04    Procedures Procedures (including critical care time)  Medications Ordered in ED Medications  sodium chloride 0.9 % bolus 500 mL (0 mLs Intravenous Stopped 01/09/17 2323)  iopamidol (ISOVUE-370) 76 % injection 100 mL (100 mLs Intravenous Contrast Given 01/09/17 2028)   Angiocath insertion Performed by: Alvino Chapel  Consent: Verbal consent obtained. Risks and benefits: risks, benefits and alternatives were discussed Time out: Immediately prior to procedure a "time out" was called to verify the correct patient, procedure, equipment, support staff and site/side marked as required.  Preparation: Patient was prepped and draped in the usual sterile fashion.  Vein Location: placed one right upper arm, flushed and drawing back without difficulty.    Repeat IV placed after infiltration of RUE IV, placed right forearm  Ultrasound Guided  Gauge: 20g  Normal blood return and flush without difficulty Patient tolerance: Patient tolerated the procedure well with no immediate complications.     Initial Impression / Assessment and Plan / ED Course  I have reviewed the triage vital signs and the nursing notes.  Pertinent labs & imaging results that were available during my care of the patient were reviewed by me and considered in my medical decision making (see chart for details).    61 year old female with history of prior right breast cancer, small cell lung cancer with metastases to the brain, significant mediastinal lymphadenopathy, presents with concern for cough and shortness of breath.  Chest x-ray shows no acute findings.  Discussed differential includes bronchitis with COPD, or given worsening shortness of breath and cancer history, possible pulmonary embolus.  Discussed with patient and friend, and would like to pursue further testing to evaluate for PE in setting of worsening dyspnea. D-dimer checked and elevated. Troponin within normal limits. Patient without neutropenia.  CT PE study done showing no evidence of pulmonary embolus, however does show rapid progression of her small cell lung cancer and lymphadenopathy, now with extrinsic  compression of the left main bronchus.  Spoke to Dr. Ashok Cordia of pulmonology, regarding whether this would require inpatient bronchoscopy and possible stenting.  Patient is well-appearing, hemodynamically stable, with no evidence of fever post obstructive pneumonia, and feel that continued radiation is the most appropriate course of care at this time.  Patient will be called by pulmonology office for follow-up appointment regarding this. She is to have radiation tomorrow.   Unfortunately, during initial CT, patient had an extravasation of IV contrast of 100 mL. She did not have any pain or numbness.  Radiology was involved, she was placed on IV contrast extravasation protocol. Ice was placed and extremity was elevated. She was observed in the emergency department for 4 hours. Initially she had no concerns, including no pain, no skin changes no numbness, no weakness.  After approximately 4 hours of observation, she was noted to develop an area of skin blanching in the area of extravasation. She continued to have capillary refill in the area, continues to not have pain, no numbness, no ulceration, no blisters. We do not have plastic surgery on call. Given patient with mild changes at this time, do not feel that plastic surgery would desire emergent operation or debridement of area.  Discussed that we could transfer to an outside hospital, but given she does not have symptoms, has mild skin change,  feel she can continue conservative measures of heat/ice and elevation and monitor symptoms overnight. Gave number for plastic surgery, Dr. Marla Roe and sent message.   Final Clinical Impressions(s) / ED Diagnoses   Final diagnoses:  Extravasation accident, initial encounter  Cough  Shortness of breath  Disease of bronchus, compression by lymphadenopathy  Small cell lung cancer ([place])  Lymphadenopathy    New Prescriptions New Prescriptions   No medications on file     Gareth Morgan, MD 01/10/17 0151

## 2017-01-09 NOTE — ED Notes (Signed)
Pt returned from radiology.

## 2017-01-10 ENCOUNTER — Ambulatory Visit
Admission: RE | Admit: 2017-01-10 | Discharge: 2017-01-10 | Disposition: A | Discharge status: Home / Self Care | Payer: 59 | Source: Ambulatory Visit | Attending: Radiation Oncology | Admitting: Radiation Oncology

## 2017-01-10 ENCOUNTER — Telehealth: Payer: Self-pay

## 2017-01-10 DIAGNOSIS — Z510 Encounter for antineoplastic radiation therapy: Secondary | ICD-10-CM | POA: Diagnosis not present

## 2017-01-10 DIAGNOSIS — C3492 Malignant neoplasm of unspecified part of left bronchus or lung: Secondary | ICD-10-CM

## 2017-01-10 NOTE — ED Notes (Addendum)
Discharge instructions and extravasation precautions reviewed with patient. Patient denies any further questions. No further changes to RUE noted at discharge.

## 2017-01-10 NOTE — Telephone Encounter (Signed)
-----   Message from Javier Glazier, MD sent at 01/09/2017 11:54 PM EDT ----- Sharon [name] was in [place] ED this evening with increased shortness of breath. Please see if we can get her in for an appointment this week to make sure her COPD is controlled. She can see me or any other MD or NP.  Thanks.

## 2017-01-10 NOTE — ED Notes (Signed)
No changes to area of extravasation noted.

## 2017-01-10 NOTE — ED Notes (Signed)
EDP aware that PIV inserted by nursing staff unable to be used for CT scan. PIV in R upper arm inserted by EDP for CT angio.

## 2017-01-10 NOTE — Progress Notes (Signed)
  Radiation Oncology         [phone] ________________________________  Name: Sharon [name] MRN: [id number]  Date: 01/10/2017  DOB: 1956-01-20  Simulation Verification Note    ICD-10-CM   1. Primary small cell carcinoma of left lung ([name]) C34.92     Status: outpatient  NARRATIVE: The patient was brought to the treatment unit and placed in the planned treatment position. The clinical setup was verified. Then port films were obtained and uploaded to the radiation oncology medical record software.  The treatment beams were carefully compared against the planned radiation fields. The position location and shape of the radiation fields was reviewed. They targeted volume of tissue appears to be appropriately covered by the radiation beams. Organs at risk appear to be excluded as planned.  Based on my personal review, I approved the simulation verification. The patient's treatment will proceed as planned.  -----------------------------------  Blair Promise, PhD, MD

## 2017-01-10 NOTE — ED Notes (Signed)
Ice pack applied to elevated RUE per protocol. Edema to upper arm in area of extravasation, no other changes noted. Pt denies pain or changes in sensation.

## 2017-01-10 NOTE — ED Notes (Signed)
Pt RUE with increased edema and affected area appearing paler than previous assessments. EDP notified.

## 2017-01-10 NOTE — Telephone Encounter (Signed)
lmtcb X1 for pt.  Just need to schedule OV with JN.

## 2017-01-11 ENCOUNTER — Ambulatory Visit
Admission: RE | Admit: 2017-01-11 | Discharge: 2017-01-11 | Disposition: A | Discharge status: Home / Self Care | Payer: 59 | Source: Ambulatory Visit | Attending: Radiation Oncology | Admitting: Radiation Oncology

## 2017-01-11 DIAGNOSIS — Z510 Encounter for antineoplastic radiation therapy: Secondary | ICD-10-CM | POA: Diagnosis not present

## 2017-01-11 DIAGNOSIS — L89323 Pressure ulcer of left buttock, stage 3: Secondary | ICD-10-CM | POA: Diagnosis not present

## 2017-01-11 NOTE — Telephone Encounter (Signed)
Called Lisa's number -no answer

## 2017-01-12 ENCOUNTER — Telehealth: Payer: Self-pay

## 2017-01-12 ENCOUNTER — Telehealth: Payer: Self-pay | Admitting: *Deleted

## 2017-01-12 ENCOUNTER — Ambulatory Visit
Admission: RE | Admit: 2017-01-12 | Discharge: 2017-01-12 | Disposition: A | Discharge status: Home / Self Care | Payer: 59 | Source: Ambulatory Visit | Attending: Radiation Oncology | Admitting: Radiation Oncology

## 2017-01-12 DIAGNOSIS — Z510 Encounter for antineoplastic radiation therapy: Secondary | ICD-10-CM | POA: Diagnosis not present

## 2017-01-12 NOTE — Telephone Encounter (Signed)
Spoke with the pt  She states she has no questions for nurse  She states she has to just check her calendar and will call back to schedule the pt  She is aware of what the appt is for and that it can be with any provider  Will close encounter

## 2017-01-12 NOTE — Telephone Encounter (Signed)
Pt returned call, I discussed with pt " friend Lattie Haw called with concerns regarding her appts." I asked pt to please fill out a Hippa form at next visit as I was unable to find a updated Hippa form. Pt advised Lattie Haw is a friend and she was unaware she was calling for her or any appt information. Pt states it is okay to speak with her daughter about her care. Again requested pt to fill out an updated Hippa form. Reviewed all future appts with pt specifically her lab/MD 10/3 and Infusion 10/4. Pt verbalized and confirmed appts. No futher concerns.

## 2017-01-12 NOTE — Telephone Encounter (Signed)
Reviewed with MD concern from pt's friend Hinda Glatter. Per MD, pt is to keep all scheduled appts. Called pt on Mobile number to confirm all future appts. Unable to reach lmovm for pt to keep all future appts and call back to confirm this message was received.

## 2017-01-12 NOTE — Telephone Encounter (Signed)
Hinda Glatter, friend, called to verify appts. Pt has brain MRI on 9/25.  Pt has lab and Mohamed on 10/3 and infusion/ nutrition on 10/4. XRT final tx is 10/4. Per Dr Julien Nordmann OV note 9/5 pt is to have a 4 week delay for C4 after completing XRT.   Does Dr Julien Nordmann want to keep lab and MD on 10/3? Does Dr Julien Nordmann want to keep nutrition if infusion is cancelled?  Also clarified for Lattie Haw that xrt is for 14 treatments per Dr Clabe Seal note 9/11. The pt was thinking it was for 24 tx.

## 2017-01-12 NOTE — Telephone Encounter (Signed)
Pt returning call.  Told pt she needed an appt., but before making it she had some questions. [phone]

## 2017-01-13 ENCOUNTER — Ambulatory Visit
Admission: RE | Admit: 2017-01-13 | Discharge: 2017-01-13 | Disposition: A | Discharge status: Home / Self Care | Payer: 59 | Source: Ambulatory Visit | Attending: Radiation Oncology | Admitting: Radiation Oncology

## 2017-01-13 DIAGNOSIS — Z510 Encounter for antineoplastic radiation therapy: Secondary | ICD-10-CM | POA: Diagnosis not present

## 2017-01-14 ENCOUNTER — Ambulatory Visit
Admission: RE | Admit: 2017-01-14 | Discharge: 2017-01-14 | Disposition: A | Discharge status: Home / Self Care | Payer: 59 | Source: Ambulatory Visit | Attending: Radiation Oncology | Admitting: Radiation Oncology

## 2017-01-14 DIAGNOSIS — Z510 Encounter for antineoplastic radiation therapy: Secondary | ICD-10-CM | POA: Diagnosis not present

## 2017-01-17 ENCOUNTER — Ambulatory Visit
Admission: RE | Admit: 2017-01-17 | Discharge: 2017-01-17 | Disposition: A | Discharge status: Home / Self Care | Payer: 59 | Source: Ambulatory Visit | Attending: Radiation Oncology | Admitting: Radiation Oncology

## 2017-01-17 ENCOUNTER — Telehealth: Payer: Self-pay | Admitting: Medical Oncology

## 2017-01-17 DIAGNOSIS — Z510 Encounter for antineoplastic radiation therapy: Secondary | ICD-10-CM | POA: Diagnosis not present

## 2017-01-17 NOTE — Telephone Encounter (Signed)
Pt called and verified all her appts.

## 2017-01-17 NOTE — Telephone Encounter (Signed)
I returned pt call and told her to call me back. I am unclear what she is needing.

## 2017-01-17 NOTE — Telephone Encounter (Signed)
I returned her call. No answer , no voice mail. If she calls triage please find out what she needs.

## 2017-01-18 ENCOUNTER — Telehealth: Payer: Self-pay | Admitting: Medical Oncology

## 2017-01-18 ENCOUNTER — Ambulatory Visit
Admission: RE | Admit: 2017-01-18 | Discharge: 2017-01-18 | Disposition: A | Discharge status: Home / Self Care | Payer: 59 | Source: Ambulatory Visit | Attending: Radiation Oncology | Admitting: Radiation Oncology

## 2017-01-18 DIAGNOSIS — C3492 Malignant neoplasm of unspecified part of left bronchus or lung: Secondary | ICD-10-CM

## 2017-01-18 DIAGNOSIS — L89323 Pressure ulcer of left buttock, stage 3: Secondary | ICD-10-CM | POA: Diagnosis not present

## 2017-01-18 DIAGNOSIS — C7931 Secondary malignant neoplasm of brain: Secondary | ICD-10-CM

## 2017-01-18 DIAGNOSIS — C7949 Secondary malignant neoplasm of other parts of nervous system: Principal | ICD-10-CM

## 2017-01-18 DIAGNOSIS — Z510 Encounter for antineoplastic radiation therapy: Secondary | ICD-10-CM | POA: Diagnosis not present

## 2017-01-18 MED ORDER — SONAFINE EX EMUL
1.0000 | Freq: Once | CUTANEOUS | Status: AC
Start: 2017-01-18 — End: 2017-01-18
  Administered 2017-01-18: 1 via TOPICAL

## 2017-01-18 NOTE — Telephone Encounter (Signed)
Returned pt call and left message to call me back with specific information or questions.

## 2017-01-19 ENCOUNTER — Telehealth: Payer: Self-pay | Admitting: Medical Oncology

## 2017-01-19 ENCOUNTER — Telehealth: Payer: Self-pay | Admitting: Oncology

## 2017-01-19 ENCOUNTER — Ambulatory Visit
Admission: RE | Admit: 2017-01-19 | Discharge: 2017-01-19 | Disposition: A | Discharge status: Home / Self Care | Payer: 59 | Source: Ambulatory Visit | Attending: Radiation Oncology | Admitting: Radiation Oncology

## 2017-01-19 DIAGNOSIS — C3492 Malignant neoplasm of unspecified part of left bronchus or lung: Secondary | ICD-10-CM

## 2017-01-19 DIAGNOSIS — Z510 Encounter for antineoplastic radiation therapy: Secondary | ICD-10-CM | POA: Diagnosis not present

## 2017-01-19 MED ORDER — SUCRALFATE 1 GM/10ML PO SUSP: 1.0000 g | Freq: Three times a day (TID) | ORAL | 0 refills | Status: AC

## 2017-01-19 NOTE — Telephone Encounter (Signed)
Patient called and said she is having burning in her esophagus.  She is wondering if she can have the Carafate suspension called in to [facility] in [place].

## 2017-01-19 NOTE — Telephone Encounter (Signed)
Eft pt a message that port wil be scheduled sometime after oct 4th and that I will call jennifer for more information.

## 2017-01-20 ENCOUNTER — Ambulatory Visit: Admission: RE | Admit: 2017-01-20 | Payer: 59 | Source: Ambulatory Visit | Admitting: Radiation Oncology

## 2017-01-20 ENCOUNTER — Ambulatory Visit
Admission: RE | Admit: 2017-01-20 | Discharge: 2017-01-20 | Disposition: A | Discharge status: Home / Self Care | Payer: 59 | Source: Ambulatory Visit | Attending: Radiation Oncology | Admitting: Radiation Oncology

## 2017-01-20 DIAGNOSIS — Z510 Encounter for antineoplastic radiation therapy: Secondary | ICD-10-CM | POA: Diagnosis not present

## 2017-01-21 ENCOUNTER — Telehealth: Payer: Self-pay | Admitting: Oncology

## 2017-01-21 ENCOUNTER — Ambulatory Visit
Admission: RE | Admit: 2017-01-21 | Discharge: 2017-01-21 | Disposition: A | Discharge status: Home / Self Care | Payer: 59 | Source: Ambulatory Visit | Attending: Radiation Oncology | Admitting: Radiation Oncology

## 2017-01-21 DIAGNOSIS — Z510 Encounter for antineoplastic radiation therapy: Secondary | ICD-10-CM | POA: Diagnosis not present

## 2017-01-21 NOTE — Telephone Encounter (Signed)
Patient called and said she has not been notified by [facility] that her Carafate prescription is available for pick up.  Advised her that the prescription was sent and received by [facility] on 01/19/17.  Called [facility] and they said that the prescription is ready.  Called patient back to let her know that the prescription is ready.

## 2017-01-24 ENCOUNTER — Ambulatory Visit
Admission: RE | Admit: 2017-01-24 | Discharge: 2017-01-24 | Disposition: A | Discharge status: Home / Self Care | Payer: 59 | Source: Ambulatory Visit | Attending: Radiation Oncology | Admitting: Radiation Oncology

## 2017-01-24 DIAGNOSIS — Z510 Encounter for antineoplastic radiation therapy: Secondary | ICD-10-CM | POA: Diagnosis not present

## 2017-01-25 ENCOUNTER — Other Ambulatory Visit: Payer: Self-pay | Admitting: Radiation Oncology

## 2017-01-25 ENCOUNTER — Ambulatory Visit
Admission: RE | Admit: 2017-01-25 | Discharge: 2017-01-25 | Disposition: A | Discharge status: Home / Self Care | Payer: 59 | Source: Ambulatory Visit | Attending: Radiation Oncology | Admitting: Radiation Oncology

## 2017-01-25 ENCOUNTER — Telehealth: Payer: Self-pay | Admitting: Medical Oncology

## 2017-01-25 DIAGNOSIS — Z510 Encounter for antineoplastic radiation therapy: Secondary | ICD-10-CM | POA: Diagnosis not present

## 2017-01-25 DIAGNOSIS — C3492 Malignant neoplasm of unspecified part of left bronchus or lung: Secondary | ICD-10-CM

## 2017-01-25 MED ORDER — OXYCODONE-ACETAMINOPHEN 5-325 MG PO TABS: 1.0000 | ORAL_TABLET | ORAL | 0 refills | Status: DC | PRN

## 2017-01-25 NOTE — Telephone Encounter (Signed)
Per patient please Remove Sharon [name] from HIPPA form . I told pt to complete new HIPPA form .   Pt asking when her MRI will be r/s . She did not like the loud country music playing at [facility] when she went and did not get her MRI. She wants to go to [facility] MRI. I transferred call to xrt.

## 2017-01-25 NOTE — Telephone Encounter (Signed)
I gave Sharon [name] all the port appt information and she will tell pt.

## 2017-01-25 NOTE — Telephone Encounter (Signed)
Sending this to you Santiago Glad, this is Dr. Sondra Come Patient

## 2017-01-25 NOTE — Telephone Encounter (Signed)
I left message for Sharon [name] to call me back about appt information for port a cath . ( appointment is 10/09 arrive at 1230 radiology [facility]. NPO after MN and needs to have a driver) Please confirm .

## 2017-01-26 ENCOUNTER — Other Ambulatory Visit: Payer: Self-pay | Admitting: Radiation Therapy

## 2017-01-26 ENCOUNTER — Ambulatory Visit
Admission: RE | Admit: 2017-01-26 | Discharge: 2017-01-26 | Disposition: A | Discharge status: Home / Self Care | Payer: 59 | Source: Ambulatory Visit | Attending: Radiation Oncology | Admitting: Radiation Oncology

## 2017-01-26 ENCOUNTER — Encounter: Payer: Self-pay | Admitting: Oncology

## 2017-01-26 ENCOUNTER — Telehealth: Payer: Self-pay | Admitting: Oncology

## 2017-01-26 ENCOUNTER — Ambulatory Visit (HOSPITAL_BASED_OUTPATIENT_CLINIC_OR_DEPARTMENT_OTHER): Payer: 59 | Admitting: Oncology

## 2017-01-26 ENCOUNTER — Other Ambulatory Visit (HOSPITAL_BASED_OUTPATIENT_CLINIC_OR_DEPARTMENT_OTHER): Payer: 59

## 2017-01-26 VITALS — BP 135/90 | HR 116 | Temp 98.8°F | Resp 18 | Ht 66.0 in | Wt 204.7 lb

## 2017-01-26 DIAGNOSIS — C3412 Malignant neoplasm of upper lobe, left bronchus or lung: Secondary | ICD-10-CM

## 2017-01-26 DIAGNOSIS — C7931 Secondary malignant neoplasm of brain: Secondary | ICD-10-CM

## 2017-01-26 DIAGNOSIS — C3492 Malignant neoplasm of unspecified part of left bronchus or lung: Secondary | ICD-10-CM

## 2017-01-26 DIAGNOSIS — Z510 Encounter for antineoplastic radiation therapy: Secondary | ICD-10-CM | POA: Diagnosis not present

## 2017-01-26 LAB — COMPREHENSIVE METABOLIC PANEL
ALT: 18 U/L (ref 0–55)
AST: 35 U/L — ABNORMAL HIGH (ref 5–34)
Albumin: 2.9 g/dL — ABNORMAL LOW (ref 3.5–5.0)
Alkaline Phosphatase: 110 U/L (ref 40–150)
Anion Gap: 11 mEq/L (ref 3–11)
BUN: 8.3 mg/dL (ref 7.0–26.0)
CHLORIDE: 103 meq/L (ref 98–109)
CO2: 24 meq/L (ref 22–29)
Calcium: 9.8 mg/dL (ref 8.4–10.4)
Creatinine: 0.8 mg/dL (ref 0.6–1.1)
EGFR: 80 mL/min/{1.73_m2} — AB (ref >90)
GLUCOSE: 136 mg/dL (ref 70–140)
POTASSIUM: 3.6 meq/L (ref 3.5–5.1)
SODIUM: 138 meq/L (ref 136–145)
TOTAL PROTEIN: 7.3 g/dL (ref 6.4–8.3)
Total Bilirubin: 0.28 mg/dL (ref 0.20–1.20)

## 2017-01-26 LAB — CBC WITH DIFFERENTIAL/PLATELET
BASO%: 0.5 % (ref 0.0–2.0)
Basophils Absolute: 0 10*3/uL (ref 0.0–0.1)
EOS ABS: 0.1 10*3/uL (ref 0.0–0.5)
EOS%: 0.9 % (ref 0.0–7.0)
HCT: 30.4 % — ABNORMAL LOW (ref 34.8–46.6)
HGB: 10.1 g/dL — ABNORMAL LOW (ref 11.6–15.9)
LYMPH#: 0.7 10*3/uL — AB (ref 0.9–3.3)
LYMPH%: 7.2 % — AB (ref 14.0–49.7)
MCH: 33.8 pg (ref 25.1–34.0)
MCHC: 33.3 g/dL (ref 31.5–36.0)
MCV: 101.5 fL — AB (ref 79.5–101.0)
MONO#: 0.8 10*3/uL (ref 0.1–0.9)
MONO%: 7.9 % (ref 0.0–14.0)
NEUT%: 83.5 % — ABNORMAL HIGH (ref 38.4–76.8)
NEUTROS ABS: 8.3 10*3/uL — AB (ref 1.5–6.5)
Platelets: 304 10*3/uL (ref 145–400)
RBC: 2.99 10*6/uL — AB (ref 3.70–5.45)
RDW: 20.1 % — ABNORMAL HIGH (ref 11.2–14.5)
WBC: 10 10*3/uL (ref 3.9–10.3)

## 2017-01-26 LAB — MAGNESIUM: MAGNESIUM: 1.4 mg/dL — AB (ref 1.5–2.5)

## 2017-01-26 MED ORDER — LIDOCAINE-PRILOCAINE 2.5-2.5 % EX CREA: 1.0000 "application " | TOPICAL_CREAM | CUTANEOUS | 2 refills | Status: DC | PRN

## 2017-01-26 MED ORDER — MAGNESIUM OXIDE 400 (241.3 MG) MG PO TABS: 400.0000 mg | ORAL_TABLET | Freq: Four times a day (QID) | ORAL | 0 refills | Status: AC

## 2017-01-26 NOTE — Telephone Encounter (Signed)
Gave patient AVS of 10/3 visit. Patient will pick up updated schedule 10/4 after scheduled appointments.

## 2017-01-26 NOTE — Progress Notes (Signed)
[facility] Follow up:    Sharon [name], [place] [address]   DIAGNOSIS: Extensive stage (T2b, N2, M1a) small cell lung cancer presented with large left upper lobe lung mass and mediastinal lymphadenopathy as well as malignant left pleural effusion diagnosed in July 2017.  SUMMARY OF ONCOLOGIC HISTORY:  No history exists.   PRIOR THERAPY:  1) Systemic chemotherapy with cisplatin 60 MG/M2 on day 1 and etoposide at 120 MG/M2 on days 1, 2 and 3 with Neulasta support on day 4. She is status post 6 cycles. 2) prophylactic cranial irradiation under the care of Dr. Sondra Come completed on 05/18/2016. 3) palliative radiotherapy to the enlarging left upper lobe lung nodule under the care of Dr. Sondra Come. 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  CURRENT THERAPY: Systemic chemotherapy with cisplatin 30 MG/M2 and irinotecan 65 MG/M2 on days 1 and 8 every 3 weeks. First dose 10/21/2016. Status post 3 cycles.  INTERVAL HISTORY: Sharon [name] 61 y.o. female returns for routine follow-up with her friend. Patient is feeling fine today except for fatigue and some pain with swallowing. She will finish of radiation tomorrow. She is using Carafate. The patient denies fevers and chills. Denies chest pain, shortness breath, cough, hemoptysis. Patient reports fair appetite. She has lost about 5 pounds since her last visit with [place] about a month ago. Reports constipation but she is using MiraLAX. Denies diarrhea. The patient is here for evaluation and discussion of further treatment.   Patient Active Problem List   Diagnosis Date Noted  . Hypomagnesemia 01/27/2017  . Chemotherapy induced neutropenia ([facility]) 12/17/2016  . Hypokalemia 12/17/2016  . Partial small bowel obstruction ([facility]) 10/15/2016  . Brain tumor ([facility])   . Seizure ([facility]) 09/28/2016  . Brain metastasis ([facility]) 09/27/2016  . Hemoptysis 08/17/2016  . Personal history of  breast cancer 03/29/2016  . Encounter for antineoplastic chemotherapy 01/05/2016  . Nausea without vomiting 12/15/2015  . Primary small cell carcinoma of left lung ([facility]) 11/18/2015  . Mediastinal adenopathy   . Breast cancer ([facility]) 07/18/2012  . 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 Smoking 08/18/2011    has No Known Allergies.  MEDICAL HISTORY: Past Medical History:  Diagnosis Date  . Cancer of right breast ([facility]) 2009  . CAP (community acquired pneumonia) 11/06/2015  . Chemotherapy induced neutropenia ([facility]) 12/17/2016  . COPD (chronic obstructive pulmonary disease) ([facility])   . Elevated blood pressure   . Encounter for antineoplastic chemotherapy 01/05/2016  . History of radiation therapy 05/04/16-05/18/16   whole brain 25 Gy in 10 fractions  . History of radiation therapy 09/01/16 - 09/21/16   Left Lung treated to 35 Gy in 14 fractions  . Hoarseness of voice    "for the last 2 months" (11/06/2015)  . Hypertension   . Hypokalemia 12/17/2016  . Lung mass dx'd 10/2015  . Menopause   . Personal history of breast cancer 03/29/2016  . Pre-diabetes   . Small cell lung cancer ([facility])   . Vitamin D deficiency     SURGICAL HISTORY: Past Surgical History:  Procedure Laterality Date  . BREAST BIOPSY Right 2009  . BREAST LUMPECTOMY Right 2009  . UTERINE FIBROID SURGERY  2000  . VAGINAL HYSTERECTOMY  2000  . VESICOVAGINAL FISTULA CLOSURE W/ TAH  2000  . VIDEO BRONCHOSCOPY WITH ENDOBRONCHIAL ULTRASOUND N/A 11/10/2015   Procedure: VIDEO BRONCHOSCOPY WITH ENDOBRONCHIAL ULTRASOUND;  Surgeon: Javier Glazier, MD;  Location: [facility];  Service:  Thoracic;  Laterality: N/A;    SOCIAL HISTORY: Social History   Social History  . Marital status: Divorced    Spouse name: N/A  . Number of children: 3  . 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 Smoking status: Former Smoker    Packs/day: 0.10    Years: 46.00    Types: Cigarettes    Quit date:  11/11/2015  . Smokeless tobacco: Never Used     Comment: Peak rate of 2ppd  . Alcohol use No  . Drug use: Yes    Types: Marijuana     Comment: 11/06/2015 "1-2 times/week when I do smoke; none lately"  . Sexual activity: Yes   Other Topics Concern  . Not on file   Social History Narrative  . No narrative on file    FAMILY HISTORY: Family History  Problem Relation Age of Onset  . Adopted: Yes  . Cancer Neg Hx     Review of Systems  Constitutional: Positive for fatigue. Negative for chills, fever and unexpected weight change.  HENT:  Negative.   Eyes: Negative.   Respiratory: Negative.   Cardiovascular: Negative.   Gastrointestinal: Positive for constipation. Negative for diarrhea, nausea and vomiting.       Mild pain with swallowing.   Genitourinary: Negative.    Musculoskeletal: Negative.   Skin: Negative.   Neurological: Negative.   Hematological: Negative.   Psychiatric/Behavioral: Negative.       PHYSICAL EXAMINATION  ECOG PERFORMANCE STATUS: 1 - Symptomatic but completely ambulatory  Vitals:   01/26/17 1442  BP: 135/90  Pulse: (!) 116  Resp: 18  Temp: 98.8 F (37.1 C)  SpO2: 99%    Physical Exam  Constitutional: She is oriented to person, place, and time and well-developed, well-nourished, and in no distress. No distress.  HENT:  Head: Normocephalic.  Mouth/Throat: Oropharynx is clear and moist. No oropharyngeal exudate.  Eyes: Conjunctivae are normal. Right eye exhibits no discharge. Left eye exhibits no discharge. No scleral icterus.  Neck: Normal range of motion. Neck supple.  Cardiovascular: Normal rate, regular rhythm, normal heart sounds and intact distal pulses.   Pulmonary/Chest: Effort normal and breath sounds normal. No respiratory distress. She has no wheezes. She has no rales.  Abdominal: Soft. Bowel sounds are normal. She exhibits no distension and no mass. There is no tenderness.  Musculoskeletal: Normal range of motion. She exhibits no  edema.  Lymphadenopathy:    She has no cervical adenopathy.  Neurological: She is alert and oriented to person, place, and time. She exhibits normal muscle tone. Gait normal. Coordination normal.  Skin: Skin is warm and dry. No rash noted. She is not diaphoretic. No erythema. No pallor.  Psychiatric: Mood, memory, affect and judgment normal.  Vitals reviewed.   LABORATORY DATA:  CBC    Component Value Date/Time   WBC 10.0 01/26/2017 1427   WBC 9.8 01/09/2017 1829   RBC 2.99 (L) 01/26/2017 1427   RBC 2.35 (L) 01/09/2017 1829   HGB 10.1 (L) 01/26/2017 1427   HCT 30.4 (L) 01/26/2017 1427   PLT 304 01/26/2017 1427   MCV 101.5 (H) 01/26/2017 1427   MCH 33.8 01/26/2017 1427   MCH 32.3 01/09/2017 1829   MCHC 33.3 01/26/2017 1427   MCHC 32.1 01/09/2017 1829   RDW 20.1 (H) 01/26/2017 1427   LYMPHSABS 0.7 (L) 01/26/2017 1427   MONOABS 0.8 01/26/2017 1427   EOSABS 0.1 01/26/2017  1427   BASOSABS 0.0 01/26/2017 1427    CMP     Component Value Date/Time   NA 138 01/26/2017 1427   K 3.6 01/26/2017 1427   CL 98 (L) 01/09/2017 1829   CL 104 07/18/2012 1451   CO2 24 01/26/2017 1427   GLUCOSE 136 01/26/2017 1427   GLUCOSE 99 07/18/2012 1451   BUN 8.3 01/26/2017 1427   CREATININE 0.8 01/26/2017 1427   CALCIUM 9.8 01/26/2017 1427   PROT 7.3 01/26/2017 1427   ALBUMIN 2.9 (L) 01/26/2017 1427   AST 35 (H) 01/26/2017 1427   ALT 18 01/26/2017 1427   ALKPHOS 110 01/26/2017 1427   BILITOT 0.28 01/26/2017 1427   GFRNONAA >60 01/09/2017 1829   GFRAA >60 01/09/2017 1829  Magnesium 1.4  RADIOGRAPHIC STUDIES:  No results found.  ASSESSMENT and THERAPY PLAN:   Primary small cell carcinoma of left lung ([name]) This is a very pleasant 61 year old white female with extensive stage small cell lung cancer status post 6 cycles of systemic chemotherapy with cisplatin and etoposide followed by prophylactic cranial irradiation followed by palliative radiotherapy to a left upper lobe pulmonary  nodule followed by stereotactic radiotherapy to solitary brain metastasis. This was followed by disease progression with new bulky left supraclavicular lymphadenopathy as well as new enlarged mediastinal left hilar lymph nodes. The patient was started on systemic chemotherapy with cisplatin 30 MG/M2 and irinotecan 65 MG/M2 on days 1 and 8 every 3 weeks status post 3 cycles. She tolerated her treatment fairly well except for neutropenia as well as intermittent nausea. She had a repeat CT scan of the chest, abdomen and pelvis performed. Her scan showed improvement of the disease in the supraclavicular lymph node as well as mediastinum but there was enlarging left hilar lymph node.  The patient is receiving palliative radiotherapy to the enlarging left hilar lymph node as well as the persistent supraclavicular lymphadenopathy. She is due to complete this tomorrow.   The patient was seen with Dr. Julien Nordmann. The patient has a Port-A-Cath placement scheduled for October 9th. Plan is to resume chemotherapy next week after her Port-A-Cath is placed. The patient will return in one week for labs and for cycle 4 day 1 of her chemotherapy. She will be seen back for a visit on cycle 4 day 8 for reevaluation.  The patient was advised to call immediately if she has any concerning symptoms in the interval. The patient voices understanding of current disease status and treatment options and is in agreement with the current care plan. All questions were answered. The patient knows to call the clinic with any problems, questions or concerns. We can certainly see the patient much sooner if necessary.  Hypomagnesemia The patient's magnesium level is low 1.4. The patient reports that she stopped taking her magnesium supplementation several days ago. I have advised her to resume her magnesium. I have sent a new prescription to her pharmacy for this today.   No orders of the defined types were placed in this  encounter.   All questions were answered. The patient knows to call the clinic with any problems, questions or concerns. We can certainly see the patient much sooner if necessary.  Mikey Bussing, NP 01/27/2017   ADDENDUM: Hematology/Oncology Attending: I had a face to face encounter with the patient. I recommended her care plan. This is a very pleasant 61 years old white female with recurrent small cell lung cancer. She is currently undergoing systemic chemotherapy with reduced dose cisplatin and  etoposide. Her treatment has been on hold during course of palliative radiotherapy to the mediastinal and supraclavicular lymph nodes. The patient tolerated this treatment fairly well. She is here today for evaluation and discussion of resuming her treatment again. She is expected to have a Port-A-Cath placed early next week. I will delay the start of day 1 of cycle #4 x 1 more week until the patient has a Port-A-Cath placed. For the hypomagnesemia she will continue on magnesium oxide supplement for now. I would see the patient back for follow-up visit in 2 weeks for evaluation and management of any adverse effect of her treatment. She was advised to call immediately if she has any concerning symptoms in the interval.  Disclaimer: This note was dictated with voice recognition software. Similar sounding words can inadvertently be transcribed and may be missed upon review. [name], MD 01/29/17

## 2017-01-27 ENCOUNTER — Ambulatory Visit: Payer: 59

## 2017-01-27 ENCOUNTER — Ambulatory Visit
Admission: RE | Admit: 2017-01-27 | Discharge: 2017-01-27 | Disposition: A | Discharge status: Home / Self Care | Payer: 59 | Source: Ambulatory Visit | Attending: Radiation Oncology | Admitting: Radiation Oncology

## 2017-01-27 ENCOUNTER — Encounter: Payer: 59 | Admitting: Nutrition

## 2017-01-27 ENCOUNTER — Encounter: Payer: Self-pay | Admitting: Radiation Oncology

## 2017-01-27 DIAGNOSIS — Z510 Encounter for antineoplastic radiation therapy: Secondary | ICD-10-CM | POA: Diagnosis not present

## 2017-01-27 NOTE — Assessment & Plan Note (Signed)
The patient's magnesium level is low 1.4. The patient reports that she stopped taking her magnesium supplementation several days ago. I have advised her to resume her magnesium. I have sent a new prescription to her pharmacy for this today.

## 2017-01-27 NOTE — Assessment & Plan Note (Addendum)
This is a very pleasant 61 year old white female with extensive stage small cell lung cancer status post 6 cycles of systemic chemotherapy with cisplatin and etoposide followed by prophylactic cranial irradiation followed by palliative radiotherapy to a left upper lobe pulmonary nodule followed by stereotactic radiotherapy to solitary brain metastasis. This was followed by disease progression with new bulky left supraclavicular lymphadenopathy as well as new enlarged mediastinal left hilar lymph nodes. The patient was started on systemic chemotherapy with cisplatin 30 MG/M2 and irinotecan 65 MG/M2 on days 1 and 8 every 3 weeks status post 3 cycles. She tolerated her treatment fairly well except for neutropenia as well as intermittent nausea. She had a repeat CT scan of the chest, abdomen and pelvis performed. Her scan showed improvement of the disease in the supraclavicular lymph node as well as mediastinum but there was enlarging left hilar lymph node.  The patient is receiving palliative radiotherapy to the enlarging left hilar lymph node as well as the persistent supraclavicular lymphadenopathy. She is due to complete this tomorrow.   The patient was seen with Dr. Julien Nordmann. The patient has a Port-A-Cath placement scheduled for October 9th. Plan is to resume chemotherapy next week after her Port-A-Cath is placed. The patient will return in one week for labs and for cycle 4 day 1 of her chemotherapy. She will be seen back for a visit on cycle 4 day 8 for reevaluation.  The patient was advised to call immediately if she has any concerning symptoms in the interval. The patient voices understanding of current disease status and treatment options and is in agreement with the current care plan. All questions were answered. The patient knows to call the clinic with any problems, questions or concerns. We can certainly see the patient much sooner if necessary.

## 2017-01-31 ENCOUNTER — Other Ambulatory Visit: Payer: Self-pay | Admitting: General Surgery

## 2017-01-31 NOTE — Progress Notes (Signed)
  Radiation Oncology         [phone] ________________________________  Name: Sharon [name] MRN: [id number]  Date: 01/27/2017  DOB: 1956/01/13  End of Treatment Note  Diagnosis:   Recurrent small cell lung cancer with bulky left supraclavicular and AP window adenopathy     Indication for treatment:  Palliative       Radiation treatment dates:   01/10/2017 to 01/27/2017  Site/dose:   The chest, left supraclavicular adenopathy was treated to 35 Gy in 14 fractions of 2.5 Gy.  Beams/energy:   3D // 6X  Narrative: The patient tolerated radiation treatment relatively well.   She reports having pain with swallowing and is taking Carafate. She reports having shortness of breath and has a cough with clear sputum. She reports fatigue.   Plan: The patient has completed radiation treatment. The patient will return to radiation oncology clinic for routine followup in one month. I advised them to call or return sooner if they have any questions or concerns related to their recovery or treatment.  -----------------------------------  Blair Promise, PhD, MD  This document serves as a record of services personally performed by Gery Pray, MD. It was created on his behalf by Arlyce Harman, a trained medical scribe. The creation of this record is based on the scribe's personal observations and the provider's statements to them. This document has been checked and approved by the attending provider.

## 2017-02-01 ENCOUNTER — Encounter (HOSPITAL_COMMUNITY): Payer: Self-pay

## 2017-02-01 ENCOUNTER — Ambulatory Visit (HOSPITAL_COMMUNITY)
Admission: RE | Admit: 2017-02-01 | Discharge: 2017-02-01 | Disposition: A | Discharge status: Home / Self Care | Payer: 59 | Source: Ambulatory Visit | Attending: Internal Medicine | Admitting: Internal Medicine

## 2017-02-01 ENCOUNTER — Other Ambulatory Visit: Payer: Self-pay | Admitting: Internal Medicine

## 2017-02-01 DIAGNOSIS — Z87891 Personal history of nicotine dependence: Secondary | ICD-10-CM | POA: Insufficient documentation

## 2017-02-01 DIAGNOSIS — Z853 Personal history of malignant neoplasm of breast: Secondary | ICD-10-CM | POA: Insufficient documentation

## 2017-02-01 DIAGNOSIS — J449 Chronic obstructive pulmonary disease, unspecified: Secondary | ICD-10-CM | POA: Diagnosis not present

## 2017-02-01 DIAGNOSIS — C3492 Malignant neoplasm of unspecified part of left bronchus or lung: Secondary | ICD-10-CM

## 2017-02-01 DIAGNOSIS — R7303 Prediabetes: Secondary | ICD-10-CM | POA: Diagnosis not present

## 2017-02-01 DIAGNOSIS — D701 Agranulocytosis secondary to cancer chemotherapy: Secondary | ICD-10-CM | POA: Insufficient documentation

## 2017-02-01 DIAGNOSIS — C3490 Malignant neoplasm of unspecified part of unspecified bronchus or lung: Secondary | ICD-10-CM | POA: Diagnosis present

## 2017-02-01 DIAGNOSIS — Z7951 Long term (current) use of inhaled steroids: Secondary | ICD-10-CM | POA: Diagnosis not present

## 2017-02-01 DIAGNOSIS — I10 Essential (primary) hypertension: Secondary | ICD-10-CM | POA: Insufficient documentation

## 2017-02-01 DIAGNOSIS — E559 Vitamin D deficiency, unspecified: Secondary | ICD-10-CM | POA: Insufficient documentation

## 2017-02-01 HISTORY — PX: IR FLUORO GUIDE PORT INSERTION RIGHT: IMG5741

## 2017-02-01 HISTORY — PX: IR US GUIDE VASC ACCESS RIGHT: IMG2390

## 2017-02-01 LAB — CBC WITH DIFFERENTIAL/PLATELET
Basophils Absolute: 0 10*3/uL (ref 0.0–0.1)
Basophils Relative: 1 %
EOS PCT: 1 %
Eosinophils Absolute: 0.1 10*3/uL (ref 0.0–0.7)
HCT: 29.3 % — ABNORMAL LOW (ref 36.0–46.0)
Hemoglobin: 9.7 g/dL — ABNORMAL LOW (ref 12.0–15.0)
LYMPHS ABS: 0.6 10*3/uL — AB (ref 0.7–4.0)
LYMPHS PCT: 9 %
MCH: 33.7 pg (ref 26.0–34.0)
MCHC: 33.1 g/dL (ref 30.0–36.0)
MCV: 101.7 fL — AB (ref 78.0–100.0)
MONO ABS: 0.9 10*3/uL (ref 0.1–1.0)
Monocytes Relative: 14 %
Neutro Abs: 4.8 10*3/uL (ref 1.7–7.7)
Neutrophils Relative %: 75 %
PLATELETS: 237 10*3/uL (ref 150–400)
RBC: 2.88 MIL/uL — ABNORMAL LOW (ref 3.87–5.11)
RDW: 16.8 % — AB (ref 11.5–15.5)
WBC: 6.4 10*3/uL (ref 4.0–10.5)

## 2017-02-01 LAB — PROTIME-INR
INR: 0.78
Prothrombin Time: 10.8 seconds — ABNORMAL LOW (ref 11.4–15.2)

## 2017-02-01 MED ORDER — LIDOCAINE-EPINEPHRINE (PF) 2 %-1:200000 IJ SOLN
INTRAMUSCULAR | Status: AC
  Filled 2017-02-01: qty 20

## 2017-02-01 MED ORDER — SODIUM CHLORIDE 0.9 % IV SOLN
INTRAVENOUS | Status: DC
  Administered 2017-02-01: 13:00:00 via INTRAVENOUS

## 2017-02-01 MED ORDER — FENTANYL CITRATE (PF) 100 MCG/2ML IJ SOLN
INTRAMUSCULAR | Status: AC | PRN
  Administered 2017-02-01 (×2): 50 ug via INTRAVENOUS

## 2017-02-01 MED ORDER — LIDOCAINE-EPINEPHRINE (PF) 2 %-1:200000 IJ SOLN
INTRAMUSCULAR | Status: AC | PRN
  Administered 2017-02-01: 10 mL

## 2017-02-01 MED ORDER — HEPARIN SOD (PORK) LOCK FLUSH 100 UNIT/ML IV SOLN
INTRAVENOUS | Status: AC
  Filled 2017-02-01: qty 5

## 2017-02-01 MED ORDER — CEFAZOLIN SODIUM-DEXTROSE 2-4 GM/100ML-% IV SOLN
2.0000 g | INTRAVENOUS | Status: AC
  Administered 2017-02-01: 2 g via INTRAVENOUS

## 2017-02-01 MED ORDER — MIDAZOLAM HCL 2 MG/2ML IJ SOLN
INTRAMUSCULAR | Status: AC | PRN
  Administered 2017-02-01 (×2): 1 mg via INTRAVENOUS

## 2017-02-01 MED ORDER — MIDAZOLAM HCL 2 MG/2ML IJ SOLN
INTRAMUSCULAR | Status: AC
  Filled 2017-02-01: qty 2

## 2017-02-01 MED ORDER — FENTANYL CITRATE (PF) 100 MCG/2ML IJ SOLN
INTRAMUSCULAR | Status: AC
  Filled 2017-02-01: qty 2

## 2017-02-01 MED ORDER — CEFAZOLIN SODIUM-DEXTROSE 2-4 GM/100ML-% IV SOLN
INTRAVENOUS | Status: AC
  Administered 2017-02-01: 2 g via INTRAVENOUS
  Filled 2017-02-01: qty 100

## 2017-02-01 NOTE — Procedures (Signed)
Pre Procedure Dx: Lung cancer Post Procedural Dx: Same  Successful placement of right IJ approach port-a-cath with tip at the superior caval atrial junction. The catheter is ready for immediate use.  Estimated Blood Loss: Minimal  Complications: None immediate.  Jay Aleece Loyd, MD Pager #: [phone]   

## 2017-02-01 NOTE — H&P (Signed)
Chief Complaint: Patient was seen in consultation today for port placement at the request of [facility]  Referring Physician(s): Mohamed,Mohamed  Supervising Physician: Sandi Mariscal  Patient Status: [facility] - Out-pt  History of Present Illness: Sharon [name] is a 61 y.o. female who is in the midst of treatment for metastatic lung cancer. She has undergone radiation to the left chest and brain and 6 cycles of systemic. She is to continue therapy but needs improved access. She is referred for port placement. She has prior hx of right sided breast cancer and received treatment for that as well but never had a port. PMHx, meds, labs, imaging reviewed. Has been NPO this am. Feels well.   Past Medical History:  Diagnosis Date  . Cancer of right breast ([place]) 2009  . CAP (community acquired pneumonia) 11/06/2015  . Chemotherapy induced neutropenia ([place]) 12/17/2016  . COPD (chronic obstructive pulmonary disease) ([place])   . Elevated blood pressure   . Encounter for antineoplastic chemotherapy 01/05/2016  . History of radiation therapy 05/04/16-05/18/16   whole brain 25 Gy in 10 fractions  . History of radiation therapy 09/01/16 - 09/21/16   Left Lung treated to 35 Gy in 14 fractions  . Hoarseness of voice    "for the last 2 months" (11/06/2015)  . Hypertension   . Hypokalemia 12/17/2016  . Lung mass dx'd 10/2015  . Menopause   . Personal history of breast cancer 03/29/2016  . Pre-diabetes   . Small cell lung cancer ([place])   . Vitamin D deficiency     Past Surgical History:  Procedure Laterality Date  . BREAST BIOPSY Right 2009  . BREAST LUMPECTOMY Right 2009  . UTERINE FIBROID SURGERY  2000  . VAGINAL HYSTERECTOMY  2000  . VESICOVAGINAL FISTULA CLOSURE W/ TAH  2000  . VIDEO BRONCHOSCOPY WITH ENDOBRONCHIAL ULTRASOUND N/A 11/10/2015   Procedure: VIDEO BRONCHOSCOPY WITH ENDOBRONCHIAL ULTRASOUND;  Surgeon: Javier Glazier, MD;  Location: [facility];  Service: Thoracic;  Laterality: N/A;     Allergies: Magnesium-containing compounds  Medications: Prior to Admission medications   Medication Sig Start Date End Date Taking? Authorizing Provider  albuterol (PROVENTIL HFA;VENTOLIN HFA) 108 (90 Base) MCG/ACT inhaler Inhale 1 puff into the lungs every 6 (six) hours as needed for wheezing or shortness of breath.   Yes [provider]  budesonide-formoterol (SYMBICORT) 160-4.5 MCG/ACT inhaler Inhale 2 puffs into the lungs 2 (two) times daily. 08/17/16  Yes Javier Glazier, MD  ferrous sulfate 325 (65 FE) MG EC tablet Take 325 mg by mouth daily.   Yes [provider]  fluticasone (FLONASE) 50 MCG/ACT nasal spray Place 1 spray into both nostrils daily.  08/20/16  Yes [provider]  loperamide (IMODIUM) 2 MG capsule Take by mouth as needed for diarrhea or loose stools.   Yes [provider]  magnesium oxide (MAG-OX) 400 (241.3 Mg) MG tablet Take 1 tablet (400 mg total) by mouth 4 (four) times daily. 01/26/17  Yes Curcio, Roselie Awkward, NP  prochlorperazine (COMPAZINE) 10 MG tablet TAKE 1 TABLET BY MOUTH EVERY 6 HOURS AS NEEDED FOR NAUSEA AND VOMITING 03/12/16  Yes Curt Bears, MD  senna (SENOKOT) 8.6 MG TABS tablet Take 1 tablet (8.6 mg total) by mouth daily. 10/17/16  Yes Patrecia Pour, MD  sucralfate (CARAFATE) 1 GM/10ML suspension Take 10 mLs (1 g total) by mouth 4 (four) times daily -  with meals and at bedtime. 01/19/17  Yes Gery Pray, MD  Wound Dressings (SONAFINE EX)  Apply topically.   Yes [provider]  famotidine (PEPCID) 20 MG tablet Take 1 tablet (20 mg total) by mouth daily. Take pepcid 30 minutes prior to kdur dose. Patient not taking: Reported on 01/04/2017 11/22/16   Curt Bears, MD  Ipratropium-Albuterol (COMBIVENT RESPIMAT) 20-100 MCG/ACT AERS respimat Inhale 1 puff into the lungs every 6 (six) hours.    [provider]  lidocaine-prilocaine (EMLA) cream Apply 1 application topically as needed. 01/26/17    Maryanna Shape, NP  LORazepam (ATIVAN) 0.5 MG tablet 1 tablet po 30 minutes prior to radiation or MRI Patient not taking: Reported on 12/29/2016 09/28/16   Hayden Pedro, PA-C  meclizine (ANTIVERT) 12.5 MG tablet Take 1-2 tablets (12.5-25 mg total) by mouth 3 (three) times daily as needed for dizziness. Patient not taking: Reported on 12/29/2016 06/05/16   Virgel Manifold, MD  montelukast (SINGULAIR) 10 MG tablet Take 10 mg by mouth at bedtime.  09/06/16   [provider]  oxyCODONE-acetaminophen (PERCOCET/ROXICET) 5-325 MG tablet Take 1 tablet by mouth every 4 (four) hours as needed for severe pain. 01/25/17   Gery Pray, MD  polyethylene glycol powder (GLYCOLAX/MIRALAX) powder Take 1 Container by mouth daily.    [provider]  SANTYL ointment APP TOPICALLY UTD 12/08/16   [provider]  UNABLE TO FIND Take 10 mLs by mouth daily as needed. Med Name: CBD Oil    [provider]     Family History  Problem Relation Age of Onset  . Adopted: Yes  . Cancer Neg Hx     Social History   Social History  . Marital status: Divorced    Spouse name: N/A  . Number of children: 3  . 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 Smoking status: Former Smoker    Packs/day: 0.10    Years: 46.00    Types: Cigarettes    Quit date: 11/11/2015  . Smokeless tobacco: Never Used     Comment: Peak rate of 2ppd  . Alcohol use No  . Drug use: Yes    Types: Marijuana     Comment: 11/06/2015 "1-2 times/week when I do smoke; none lately"  . Sexual activity: Yes   Other Topics Concern  . None   Social History Narrative  . None    Review of Systems: A 12 point ROS discussed and pertinent positives are indicated in the HPI above.  All other systems are negative.  Review of Systems  Vital Signs: BP 137/72 (BP Location: Left Arm)   Pulse 94   Temp 98 F (36.7 C) (Oral)   Resp 18   Ht 5\' 6"  (1.676 m)   Wt  204 lb (92.5 kg)   SpO2 100%   BMI 32.93 kg/m   Physical Exam  Constitutional: She is oriented to person, place, and time. She appears well-developed. No distress.  HENT:  Head: Normocephalic.  Mouth/Throat: Oropharynx is clear and moist.  Neck: Normal range of motion. No JVD present. No tracheal deviation present.  Cardiovascular: Normal rate, regular rhythm and normal heart sounds.   Pulmonary/Chest: Effort normal and breath sounds normal. No respiratory distress.  Neurological: She is alert and oriented to person, place, and time.  Skin: Skin is warm and dry.  Psychiatric: She has a normal mood and affect.    Imaging: Dg Chest 2 View  Result Date: 01/09/2017 CLINICAL DATA:  Cough and congestion for 6  days. EXAM: CHEST  2 VIEW COMPARISON:  10/16/2016 FINDINGS: The cardiomediastinal silhouette is unremarkable. Left upper lung scarring again noted. There is no evidence of focal airspace disease, pulmonary edema, suspicious pulmonary nodule/mass, pleural effusion, or pneumothorax. No acute bony abnormalities are identified. IMPRESSION: No active cardiopulmonary disease. Electronically Signed   By: Margarette Canada M.D.   On: 01/09/2017 17:31   Ct Angio Chest Pe W And/or Wo Contrast  Result Date: 01/09/2017 CLINICAL DATA:  Productive cough for 2 weeks. History of metastatic breast cancer, lung mass, COPD. EXAM: CT ANGIOGRAPHY CHEST WITH CONTRAST TECHNIQUE: Multidetector CT imaging of the chest was performed using the standard protocol during bolus administration of intravenous contrast. Multiplanar CT image reconstructions and MIPs were obtained to evaluate the vascular anatomy. Contrast extravasation after initial 100 cc administered. CONTRAST:  80 cc Isovue 370. COMPARISON:  CT chest December 25, 1998 18 inch chest radiograph January 09, 2017 FINDINGS: CARDIOVASCULAR: Adequate contrast opacification of the pulmonary artery's. Main pulmonary artery is not enlarged. No pulmonary arterial filling  defects to the level of the subsegmental branches. Heart size is normal, no right heart strain. No pericardial effusion. Thoracic aorta is normal course and caliber, unremarkable. MEDIASTINUM/NODES: LEFT supraclavicular 3.5 x 3.4 cm nodal conglomeration was 2.9 x 1.9 cm. RIGHT supraclavicular 9 mm short axis lymph node was 5 mm. Pretracheal 17 mm lymph node was 10 mm. Aortopulmonary window lymph node 2.8 x 3.9 cm nodal conglomeration with 2.4 x 3 cm. Subcarinal 8 mm lymph node is now 20 mm. LUNGS/PLEURA: Bulky lymphadenopathy results in new severe stenosis LEFT mainstem bronchus with intermittent obscuration, presumably from post obstruction debris. Unchanged 5 x 14 mm parenchymal scarring LEFT upper lobe with pleural reticulonodular scarring. Similar lingular scarring. Increasing lingular nodular atelectasis. Worsening LEFT lower lobe atelectasis. Mild centrilobular and paraseptal emphysema. UPPER ABDOMEN: Included view of the abdomen is nonacute. Intimal thickening results in approximately 50% stenosis distal thoracic aorta, unchanged. Mild calcific atherosclerosis of the aortic arch. Hepatic dome calcified granuloma. MUSCULOSKELETAL: Contrast within the included RIGHT proximal humerus soft tissues corresponding to known extravasation. No destructive bony lesions or acute osseous process. Review of the MIP images confirms the above findings. IMPRESSION: 1. No acute pulmonary embolism. 2. Rapid progression of mediastinal and supraclavicular nodal metastasis. New compression LEFT mainstem bronchus resulting in severe stenosis and presumable post obstruction debris. 3. Increasing LEFT lower lobe atelectasis and increasing lingular nodular atelectasis, possible metastasis. Stable LEFT upper lobe scarring/ postradiation change. 4. RIGHT upper extremity contrast extravasation. Standard contrast extravasation protocol initiated. 5. Acute findings discussed with and reconfirmed by Dr.ERIN SCHLOSSMAN on 01/09/2017 at 10:20  pm. Aortic Atherosclerosis (ICD10-I70.0) and Emphysema (ICD10-J43.9). Electronically Signed   By: Elon Alas M.D.   On: 01/09/2017 22:25   Dg Humerus Right  Result Date: 01/09/2017 CLINICAL DATA:  IV contrast infiltration. EXAM: RIGHT HUMERUS - 2+ VIEW COMPARISON:  None. FINDINGS: Extravasated contrast is seen tracking along the ventral aspect of the right arm from the elbow to axilla. This obscures fine bony detail on the AP view of the distal humerus. No acute osseous appearing abnormality is seen. A total of 100 cc of IV contrast was infiltrated. IMPRESSION: Extravasated intravenous contrast from CT is noted along the ventral aspect of the right arm. No acute osseous appearing abnormality. Electronically Signed   By: Ashley Royalty M.D.   On: 01/09/2017 22:04    Labs:  CBC:  Recent Labs  12/17/16 0802 12/29/16 1116 01/09/17 1829 01/26/17 1427  WBC 1.2* 6.5  9.8 10.0  HGB 8.2* 8.5* 7.6* 10.1*  HCT 24.3* 26.7* 23.7* 30.4*  PLT 45* 88* 311 304    COAGS:  Recent Labs  09/27/16 1611  INR 0.99  APTT 27    BMP:  Recent Labs  09/30/16 0725 10/15/16 1050 10/16/16 0424 10/17/16 0412  12/17/16 0802 12/29/16 1116 01/09/17 1829 01/26/17 1427  NA 140 126* 132* 136  < > 132* 138 133* 138  K 3.8 4.7 4.5 4.2  < > 3.0* 4.0 3.6 3.6  CL 104 92* 98* 102  --   --   --  98*  --   CO2 26  --  26 26  < > 27 28 28 24   GLUCOSE 110* 205* 150* 153*  < > 116 135 99 136  BUN 19 23* 14 18  < > 12.7 11.6 14 8.3  CALCIUM 9.7  --  8.5* 8.8*  < > 9.4 9.2 8.9 9.8  CREATININE 0.61 0.70 0.57 0.51  < > 1.0 0.8 0.76 0.8  GFRNONAA >60  --  >60 >60  --   --   --  >60  --   GFRAA >60  --  >60 >60  --   --   --  >60  --   < > = values in this interval not displayed.  LIVER FUNCTION TESTS:  Recent Labs  12/17/16 0802 12/29/16 1116 01/09/17 1829 01/26/17 1427  BILITOT 1.24* 0.34 0.5 0.28  AST 33 11 20 35*  ALT 38 10 14 18   ALKPHOS 139 104 82 110  PROT 6.7 6.4 6.9 7.3  ALBUMIN 2.2* 2.2*  2.5* 2.9*    TUMOR MARKERS: No results for input(s): AFPTM, CEA, CA199, CHROMGRNA in the last 8760 hours.  Assessment and Plan: Metastatic lung cancer For port placement Labs pending. Risks and benefits discussed with the patient including, but not limited to bleeding, infection, pneumothorax, or fibrin sheath development and need for additional procedures. All of the patient's questions were answered, patient is agreeable to proceed. Consent signed and in chart.    Thank you for this interesting consult.  I greatly enjoyed meeting Sharon [name] and look forward to participating in their care.  A copy of this report was sent to the requesting provider on this date.  Electronically Signed: Ascencion Dike, PA-C 02/01/2017, 1:22 PM   I spent a total of 20 minutes in face to face in clinical consultation, greater than 50% of which was counseling/coordinating care for port placement

## 2017-02-01 NOTE — Discharge Instructions (Signed)
Moderate Conscious Sedation, Adult, Care After These instructions provide you with information about caring for yourself after your procedure. Your health care provider may also give you more specific instructions. Your treatment has been planned according to current medical practices, but problems sometimes occur. Call your health care provider if you have any problems or questions after your procedure. What can I expect after the procedure? After your procedure, it is common:  To feel sleepy for several hours.  To feel clumsy and have poor balance for several hours.  To have poor judgment for several hours.  To vomit if you eat too soon.  Follow these instructions at home: For at least 24 hours after the procedure:   Do not: ? Participate in activities where you could fall or become injured. ? Drive. ? Use heavy machinery. ? Drink alcohol. ? Take sleeping pills or medicines that cause drowsiness. ? Make important decisions or sign legal documents. ? Take care of children on your own.  Rest. Eating and drinking  Follow the diet recommended by your health care provider.  If you vomit: ? Drink water, juice, or soup when you can drink without vomiting. ? Make sure you have little or no nausea before eating solid foods. General instructions  Have a responsible adult stay with you until you are awake and alert.  Take over-the-counter and prescription medicines only as told by your health care provider.  If you smoke, do not smoke without supervision.  Keep all follow-up visits as told by your health care provider. This is important. Contact a health care provider if:  You keep feeling nauseous or you keep vomiting.  You feel light-headed.  You develop a rash.  You have a fever. Get help right away if:  You have trouble breathing. This information is not intended to replace advice given to you by your health care provider. Make sure you discuss any questions you have  with your health care provider. Document Released: 01/31/2013 Document Revised: 09/15/2015 Document Reviewed: 08/02/2015 Elsevier Interactive Patient Education  2018 [place] Insertion, Care After This sheet gives you information about how to care for yourself after your procedure. Your health care provider may also give you more specific instructions. If you have problems or questions, contact your health care provider. What can I expect after the procedure? After your procedure, it is common to have:  Discomfort at the port insertion site.  Bruising on the skin over the port. This should improve over 3-4 days.  Follow these instructions at home: [facility] care  After your port is placed, you will get a manufacturer's information card. The card has information about your port. Keep this card with you at all times.  Take care of the port as told by your health care provider. Ask your health care provider if you or a family member can get training for taking care of the port at home. A home health care nurse may also take care of the port.  Make sure to remember what type of port you have. Incision care  Follow instructions from your health care provider about how to take care of your port insertion site. Make sure you: ? Wash your hands with soap and water before you change your bandage (dressing). If soap and water are not available, use hand sanitizer. ? Change your dressing as told by your health care provider. ? Leave stitches (sutures), skin glue, or adhesive strips in place. These skin closures may need to stay  in place for 2 weeks or longer. If adhesive strip edges start to loosen and curl up, you may trim the loose edges. Do not remove adhesive strips completely unless your health care provider tells you to do that.  Check your port insertion site every day for signs of infection. Check for: ? More redness, swelling, or pain. ? More fluid or  blood. ? Warmth. ? Pus or a bad smell. General instructions  Do not take baths, swim, or use a hot tub until your health care provider approves.  Do not lift anything that is heavier than 10 lb (4.5 kg) for a week, or as told by your health care provider.  Ask your health care provider when it is okay to: ? Return to work or school. ? Resume usual physical activities or sports.  Do not drive for 24 hours if you were given a medicine to help you relax (sedative).  Take over-the-counter and prescription medicines only as told by your health care provider.  Wear a medical alert bracelet in case of an emergency. This will tell any health care providers that you have a port.  Keep all follow-up visits as told by your health care provider. This is important. Contact a health care provider if:  You cannot flush your port with saline as directed, or you cannot draw blood from the port.  You have a fever or chills.  You have more redness, swelling, or pain around your port insertion site.  You have more fluid or blood coming from your port insertion site.  Your port insertion site feels warm to the touch.  You have pus or a bad smell coming from the port insertion site. Get help right away if:  You have chest pain or shortness of breath.  You have bleeding from your port that you cannot control. Summary  Take care of the port as told by your health care provider.  Change your dressing as told by your health care provider.  Keep all follow-up visits as told by your health care provider. This information is not intended to replace advice given to you by your health care provider. Make sure you discuss any questions you have with your health care provider. Document Released: 01/31/2013 Document Revised: 03/03/2016 Document Reviewed: 03/03/2016 Elsevier Interactive Patient Education  2017 [place] An implanted port is a type of central line that is  placed under the skin. Central lines are used to provide IV access when treatment or nutrition needs to be given through a persons veins. Implanted ports are used for long-term IV access. An implanted port may be placed because:  You need IV medicine that would be irritating to the small veins in your hands or arms.  You need long-term IV medicines, such as antibiotics.  You need IV nutrition for a long period.  You need frequent blood draws for lab tests.  You need dialysis.  Implanted ports are usually placed in the chest area, but they can also be placed in the upper arm, the abdomen, or the leg. An implanted port has two main parts:  Reservoir. The reservoir is round and will appear as a small, raised area under your skin. The reservoir is the part where a needle is inserted to give medicines or draw blood.  Catheter. The catheter is a thin, flexible tube that extends from the reservoir. The catheter is placed into a large vein. Medicine that is inserted into the reservoir goes into the  catheter and then into the vein.  How will I care for my incision site? Do not get the incision site wet. Bathe or shower as directed by your health care provider. How is my port accessed? Special steps must be taken to access the port:  Before the port is accessed, a numbing cream can be placed on the skin. This helps numb the skin over the port site.  Your health care provider uses a sterile technique to access the port. ? Your health care provider must put on a mask and sterile gloves. ? The skin over your port is cleaned carefully with an antiseptic and allowed to dry. ? The port is gently pinched between sterile gloves, and a needle is inserted into the port.  Only "non-coring" port needles should be used to access the port. Once the port is accessed, a blood return should be checked. This helps ensure that the port is in the vein and is not clogged.  If your port needs to remain accessed for  a constant infusion, a clear (transparent) bandage will be placed over the needle site. The bandage and needle will need to be changed every week, or as directed by your health care provider.  Keep the bandage covering the needle clean and dry. Do not get it wet. Follow your health care providers instructions on how to take a shower or bath while the port is accessed.  If your port does not need to stay accessed, no bandage is needed over the port.  What is flushing? Flushing helps keep the port from getting clogged. Follow your health care providers instructions on how and when to flush the port. Ports are usually flushed with saline solution or a medicine called heparin. The need for flushing will depend on how the port is used.  If the port is used for intermittent medicines or blood draws, the port will need to be flushed: ? After medicines have been given. ? After blood has been drawn. ? As part of routine maintenance.  If a constant infusion is running, the port may not need to be flushed.  How long will my port stay implanted? The port can stay in for as long as your health care provider thinks it is needed. When it is time for the port to come out, surgery will be done to remove it. The procedure is similar to the one performed when the port was put in. When should I seek immediate medical care? When you have an implanted port, you should seek immediate medical care if:  You notice a bad smell coming from the incision site.  You have swelling, redness, or drainage at the incision site.  You have more swelling or pain at the port site or the surrounding area.  You have a fever that is not controlled with medicine.  This information is not intended to replace advice given to you by your health care provider. Make sure you discuss any questions you have with your health care provider. Document Released: 04/12/2005 Document Revised: 09/18/2015 Document Reviewed:  12/18/2012 Elsevier Interactive Patient Education  2017 [employer].

## 2017-02-04 ENCOUNTER — Ambulatory Visit: Payer: 59 | Admitting: Nutrition

## 2017-02-04 ENCOUNTER — Ambulatory Visit (HOSPITAL_BASED_OUTPATIENT_CLINIC_OR_DEPARTMENT_OTHER): Payer: 59

## 2017-02-04 ENCOUNTER — Ambulatory Visit: Payer: 59

## 2017-02-04 ENCOUNTER — Other Ambulatory Visit (HOSPITAL_BASED_OUTPATIENT_CLINIC_OR_DEPARTMENT_OTHER): Payer: 59

## 2017-02-04 VITALS — BP 126/72 | HR 80 | Temp 98.6°F | Resp 18

## 2017-02-04 DIAGNOSIS — Z5111 Encounter for antineoplastic chemotherapy: Secondary | ICD-10-CM | POA: Diagnosis not present

## 2017-02-04 DIAGNOSIS — C3492 Malignant neoplasm of unspecified part of left bronchus or lung: Secondary | ICD-10-CM

## 2017-02-04 DIAGNOSIS — C3412 Malignant neoplasm of upper lobe, left bronchus or lung: Secondary | ICD-10-CM

## 2017-02-04 LAB — COMPREHENSIVE METABOLIC PANEL
ALT: 21 U/L (ref 0–55)
AST: 45 U/L — AB (ref 5–34)
Albumin: 2.7 g/dL — ABNORMAL LOW (ref 3.5–5.0)
Alkaline Phosphatase: 120 U/L (ref 40–150)
Anion Gap: 14 mEq/L — ABNORMAL HIGH (ref 3–11)
BILIRUBIN TOTAL: 0.28 mg/dL (ref 0.20–1.20)
BUN: 7.3 mg/dL (ref 7.0–26.0)
CHLORIDE: 103 meq/L (ref 98–109)
CO2: 22 meq/L (ref 22–29)
CREATININE: 0.8 mg/dL (ref 0.6–1.1)
Calcium: 9.4 mg/dL (ref 8.4–10.4)
EGFR: >60 mL/min/{1.73_m2} (ref >60)
GLUCOSE: 111 mg/dL (ref 70–140)
Potassium: 3.3 mEq/L — ABNORMAL LOW (ref 3.5–5.1)
SODIUM: 138 meq/L (ref 136–145)
TOTAL PROTEIN: 6.7 g/dL (ref 6.4–8.3)

## 2017-02-04 LAB — CBC WITH DIFFERENTIAL/PLATELET
BASO%: 0.3 % (ref 0.0–2.0)
Basophils Absolute: 0 10*3/uL (ref 0.0–0.1)
EOS%: 2.3 % (ref 0.0–7.0)
Eosinophils Absolute: 0.2 10*3/uL (ref 0.0–0.5)
HCT: 30 % — ABNORMAL LOW (ref 34.8–46.6)
HGB: 9.7 g/dL — ABNORMAL LOW (ref 11.6–15.9)
LYMPH%: 16 % (ref 14.0–49.7)
MCH: 33.4 pg (ref 25.1–34.0)
MCHC: 32.3 g/dL (ref 31.5–36.0)
MCV: 103.4 fL — ABNORMAL HIGH (ref 79.5–101.0)
MONO#: 0.2 10*3/uL (ref 0.1–0.9)
MONO%: 3.1 % (ref 0.0–14.0)
NEUT%: 78.3 % — ABNORMAL HIGH (ref 38.4–76.8)
NEUTROS ABS: 5.9 10*3/uL (ref 1.5–6.5)
Platelets: 177 10*3/uL (ref 145–400)
RBC: 2.9 10*6/uL — AB (ref 3.70–5.45)
RDW: 16.7 % — ABNORMAL HIGH (ref 11.2–14.5)
WBC: 7.5 10*3/uL (ref 3.9–10.3)
lymph#: 1.2 10*3/uL (ref 0.9–3.3)

## 2017-02-04 LAB — MAGNESIUM: MAGNESIUM: 1.9 mg/dL (ref 1.5–2.5)

## 2017-02-04 MED ORDER — SODIUM CHLORIDE 0.9% FLUSH
10.0000 mL | INTRAVENOUS | Status: DC | PRN
  Administered 2017-02-04: 10 mL
  Filled 2017-02-04: qty 10

## 2017-02-04 MED ORDER — SODIUM CHLORIDE 0.9 % IV SOLN
30.0000 mg/m2 | Freq: Once | INTRAVENOUS | Status: AC
  Administered 2017-02-04: 68 mg via INTRAVENOUS
  Filled 2017-02-04: qty 68

## 2017-02-04 MED ORDER — IRINOTECAN HCL CHEMO INJECTION 100 MG/5ML
65.0000 mg/m2 | Freq: Once | INTRAVENOUS | Status: AC
  Administered 2017-02-04: 140 mg via INTRAVENOUS
  Filled 2017-02-04: qty 7

## 2017-02-04 MED ORDER — PALONOSETRON HCL INJECTION 0.25 MG/5ML
INTRAVENOUS | Status: AC
  Filled 2017-02-04: qty 5

## 2017-02-04 MED ORDER — ATROPINE SULFATE 1 MG/ML IJ SOLN
INTRAMUSCULAR | Status: AC
  Filled 2017-02-04: qty 1

## 2017-02-04 MED ORDER — PALONOSETRON HCL INJECTION 0.25 MG/5ML
0.2500 mg | Freq: Once | INTRAVENOUS | Status: AC
  Administered 2017-02-04: 0.25 mg via INTRAVENOUS

## 2017-02-04 MED ORDER — POTASSIUM CHLORIDE 2 MEQ/ML IV SOLN
Freq: Once | INTRAVENOUS | Status: AC
  Administered 2017-02-04: 10:00:00 via INTRAVENOUS
  Filled 2017-02-04: qty 10

## 2017-02-04 MED ORDER — SODIUM CHLORIDE 0.9 % IV SOLN
Freq: Once | INTRAVENOUS | Status: AC
  Administered 2017-02-04: 13:00:00 via INTRAVENOUS
  Filled 2017-02-04: qty 5

## 2017-02-04 MED ORDER — SODIUM CHLORIDE 0.9 % IV SOLN
Freq: Once | INTRAVENOUS | Status: AC
  Administered 2017-02-04: 08:00:00 via INTRAVENOUS

## 2017-02-04 MED ORDER — ATROPINE SULFATE 1 MG/ML IJ SOLN: 0.5000 mg | Freq: Once | INTRAMUSCULAR | Status: DC | PRN

## 2017-02-04 MED ORDER — HEPARIN SOD (PORK) LOCK FLUSH 100 UNIT/ML IV SOLN
500.0000 [IU] | Freq: Once | INTRAVENOUS | Status: AC | PRN
  Administered 2017-02-04: 500 [IU]
  Filled 2017-02-04: qty 5

## 2017-02-04 NOTE — Progress Notes (Signed)
100 ml of urine measured but urine also noted in toilet, pt states that she peed at least double what was measured. Per Sharon Levering NP okay to proceed with treatment.

## 2017-02-04 NOTE — Progress Notes (Signed)
Nutrition follow-up completed with patient during infusion for small cell lung cancer with brain metastases. Weight is stable at 204.7 pounds. Patient reports sore throat from radiation therapy. She has occasional nausea. She reports her decubitus ulcers have healed and she has been released from wound clinic. Reports drinking Ensure Plus one bottle every other day. Patient requesting a food list with high potassium foods.  Noted potassium 3.3.  Nutrition diagnosis: Severe malnutrition, ongoing.  Intervention: Educated patient to continue strategies for adequate calories and protein intake. Provided high potassium food list. Provided ensure coupons and encouraged patient to continue oral intake as needed. Questions answered.  Teach back method used.  Monitoring, evaluation, goals: Patient will tolerate increased calories and protein to promote weight maintenance.   Next visit: To be scheduled as needed.

## 2017-02-04 NOTE — Patient Instructions (Signed)
[facility] Discharge Instructions for Patients Receiving Chemotherapy  Today you received the following chemotherapy agents: Irinotecan and Cisplatin   To help prevent nausea and vomiting after your treatment, we encourage you to take your nausea medication as directed.    If you develop nausea and vomiting that is not controlled by your nausea medication, call the clinic.   BELOW ARE SYMPTOMS THAT SHOULD BE REPORTED IMMEDIATELY:  *FEVER GREATER THAN 100.5 F  *CHILLS WITH OR WITHOUT FEVER  NAUSEA AND VOMITING THAT IS NOT CONTROLLED WITH YOUR NAUSEA MEDICATION  *UNUSUAL SHORTNESS OF BREATH  *UNUSUAL BRUISING OR BLEEDING  TENDERNESS IN MOUTH AND THROAT WITH OR WITHOUT PRESENCE OF ULCERS  *URINARY PROBLEMS  *BOWEL PROBLEMS  UNUSUAL RASH Items with * indicate a potential emergency and should be followed up as soon as possible.  Feel free to call the clinic should you have any questions or concerns. The clinic phone number is [phone].  Please show the [place] at check-in to the Emergency Department and triage nurse.

## 2017-02-10 ENCOUNTER — Ambulatory Visit
Admission: RE | Admit: 2017-02-10 | Discharge: 2017-02-10 | Disposition: A | Discharge status: Home / Self Care | Payer: 59 | Source: Ambulatory Visit | Attending: Radiation Oncology | Admitting: Radiation Oncology

## 2017-02-10 VITALS — BP 133/72 | HR 93 | Temp 98.3°F | Ht 66.0 in | Wt 195.0 lb

## 2017-02-10 DIAGNOSIS — C7931 Secondary malignant neoplasm of brain: Secondary | ICD-10-CM | POA: Insufficient documentation

## 2017-02-10 MED ORDER — GADOBENATE DIMEGLUMINE 529 MG/ML IV SOLN
18.0000 mL | Freq: Once | INTRAVENOUS | Status: AC | PRN
  Administered 2017-02-10: 18 mL via INTRAVENOUS

## 2017-02-10 MED ORDER — SODIUM CHLORIDE 0.9% FLUSH
10.0000 mL | Freq: Once | INTRAVENOUS | Status: AC
  Administered 2017-02-10: 10 mL via INTRAVENOUS

## 2017-02-10 MED ORDER — HEPARIN SOD (PORK) LOCK FLUSH 100 UNIT/ML IV SOLN
500.0000 [IU] | Freq: Once | INTRAVENOUS | Status: AC
  Administered 2017-02-10: 500 [IU] via INTRAVENOUS

## 2017-02-10 NOTE — Progress Notes (Signed)
Sharon [name] 61 y.o. 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    PAIN:  She is currently denies having pain. NEURO:Alert and oriented x 3 with fluent speech,able to complete sentences without difficulty with word finding or organization of sentences. Denies any visual disturbance,blurred vision,double vision,blind spots or peripheral vision changes. Reports having nausea; no vomiting,ataxia,ring of ears,dizziness or headache.  Having diarrhea four times a day taking imodium. Fine motor movement-picking up objects with fingers,holding objects, writing, weakness of lower extremities:  None Aphasia/Slurred speech:No SKIN:Warm and dry. Arm and dryImaging:02-10-17 MRI brain Lab:02-04-17 CBC w diff, Cmet, magnesium  Wt Readings from Last 3 Encounters:  02/15/17 197 lb 6.4 oz (89.5 kg)  02/11/17 198 lb (89.8 kg)  02/10/17 195 lb (88.5 kg)  BP 114/66   Pulse (!) 104   Temp 97.8 F (36.6 C) (Oral)   Resp 18   Ht 5\' 6"  (1.676 m)   Wt 197 lb 6.4 oz (89.5 kg)   SpO2 98%   BMI 31.86 kg/m

## 2017-02-10 NOTE — Progress Notes (Signed)
Has armband been applied? yes  Does patient have an allergy to IV contrast dye?: no   Has patient ever received premedication for IV contrast dye?: n/a  Does patient take metformin?: no  If patient does take metformin when was the last dose:  Date of lab work:02/04/2017 BUN: 7.3 CR: 0.9, EFGR >60  IV site: Right Power Port  Has IV site been added to flowsheet?  yes

## 2017-02-11 ENCOUNTER — Ambulatory Visit (HOSPITAL_BASED_OUTPATIENT_CLINIC_OR_DEPARTMENT_OTHER): Payer: 59

## 2017-02-11 ENCOUNTER — Ambulatory Visit: Payer: 59

## 2017-02-11 ENCOUNTER — Other Ambulatory Visit (HOSPITAL_BASED_OUTPATIENT_CLINIC_OR_DEPARTMENT_OTHER): Payer: 59

## 2017-02-11 ENCOUNTER — Ambulatory Visit (HOSPITAL_BASED_OUTPATIENT_CLINIC_OR_DEPARTMENT_OTHER): Payer: 59 | Admitting: Oncology

## 2017-02-11 ENCOUNTER — Other Ambulatory Visit: Payer: Self-pay | Admitting: Oncology

## 2017-02-11 VITALS — BP 111/61 | HR 84 | Temp 98.1°F | Resp 18 | Ht 66.0 in | Wt 198.0 lb

## 2017-02-11 DIAGNOSIS — C3412 Malignant neoplasm of upper lobe, left bronchus or lung: Secondary | ICD-10-CM

## 2017-02-11 DIAGNOSIS — Z5111 Encounter for antineoplastic chemotherapy: Secondary | ICD-10-CM | POA: Diagnosis not present

## 2017-02-11 DIAGNOSIS — C3492 Malignant neoplasm of unspecified part of left bronchus or lung: Secondary | ICD-10-CM

## 2017-02-11 DIAGNOSIS — C7931 Secondary malignant neoplasm of brain: Secondary | ICD-10-CM | POA: Diagnosis not present

## 2017-02-11 DIAGNOSIS — R530 Neoplastic (malignant) related fatigue: Secondary | ICD-10-CM | POA: Diagnosis not present

## 2017-02-11 DIAGNOSIS — K5900 Constipation, unspecified: Secondary | ICD-10-CM | POA: Diagnosis not present

## 2017-02-11 LAB — CBC WITH DIFFERENTIAL/PLATELET
BASO%: 0.3 % (ref 0.0–2.0)
Basophils Absolute: 0 10*3/uL (ref 0.0–0.1)
EOS%: 1.4 % (ref 0.0–7.0)
Eosinophils Absolute: 0.1 10*3/uL (ref 0.0–0.5)
HEMATOCRIT: 29.6 % — AB (ref 34.8–46.6)
HEMOGLOBIN: 10.1 g/dL — AB (ref 11.6–15.9)
LYMPH#: 0.4 10*3/uL — AB (ref 0.9–3.3)
LYMPH%: 7 % — ABNORMAL LOW (ref 14.0–49.7)
MCH: 34.3 pg — AB (ref 25.1–34.0)
MCHC: 34.1 g/dL (ref 31.5–36.0)
MCV: 100.6 fL (ref 79.5–101.0)
MONO#: 0.4 10*3/uL (ref 0.1–0.9)
MONO%: 7 % (ref 0.0–14.0)
NEUT#: 4.3 10*3/uL (ref 1.5–6.5)
NEUT%: 84.3 % — AB (ref 38.4–76.8)
PLATELETS: 130 10*3/uL — AB (ref 145–400)
RBC: 2.94 10*6/uL — ABNORMAL LOW (ref 3.70–5.45)
RDW: 17.4 % — AB (ref 11.2–14.5)
WBC: 5.1 10*3/uL (ref 3.9–10.3)

## 2017-02-11 LAB — COMPREHENSIVE METABOLIC PANEL
ALBUMIN: 3.1 g/dL — AB (ref 3.5–5.0)
ALK PHOS: 156 U/L — AB (ref 40–150)
ALT: 34 U/L (ref 0–55)
ANION GAP: 13 meq/L — AB (ref 3–11)
AST: 82 U/L — ABNORMAL HIGH (ref 5–34)
BUN: 13.8 mg/dL (ref 7.0–26.0)
CALCIUM: 9.5 mg/dL (ref 8.4–10.4)
CHLORIDE: 99 meq/L (ref 98–109)
CO2: 24 mEq/L (ref 22–29)
CREATININE: 0.8 mg/dL (ref 0.6–1.1)
EGFR: >60 mL/min/{1.73_m2} (ref >60)
Glucose: 92 mg/dl (ref 70–140)
Potassium: 3.5 mEq/L (ref 3.5–5.1)
Sodium: 136 mEq/L (ref 136–145)
Total Bilirubin: 0.36 mg/dL (ref 0.20–1.20)
Total Protein: 7.2 g/dL (ref 6.4–8.3)

## 2017-02-11 LAB — MAGNESIUM: Magnesium: 1.4 mg/dl — CL (ref 1.5–2.5)

## 2017-02-11 MED ORDER — SODIUM CHLORIDE 0.9% FLUSH
10.0000 mL | INTRAVENOUS | Status: DC | PRN
  Administered 2017-02-11: 10 mL
  Filled 2017-02-11: qty 10

## 2017-02-11 MED ORDER — ATROPINE SULFATE 1 MG/ML IJ SOLN: 0.5000 mg | Freq: Once | INTRAMUSCULAR | Status: DC | PRN

## 2017-02-11 MED ORDER — HEPARIN SOD (PORK) LOCK FLUSH 100 UNIT/ML IV SOLN
500.0000 [IU] | Freq: Once | INTRAVENOUS | Status: AC | PRN
  Administered 2017-02-11: 500 [IU]
  Filled 2017-02-11: qty 5

## 2017-02-11 MED ORDER — PALONOSETRON HCL INJECTION 0.25 MG/5ML
INTRAVENOUS | Status: AC
  Filled 2017-02-11: qty 5

## 2017-02-11 MED ORDER — PALONOSETRON HCL INJECTION 0.25 MG/5ML
0.2500 mg | Freq: Once | INTRAVENOUS | Status: AC
  Administered 2017-02-11: 0.25 mg via INTRAVENOUS

## 2017-02-11 MED ORDER — SODIUM CHLORIDE 0.9 % IV SOLN
Freq: Once | INTRAVENOUS | Status: AC
  Administered 2017-02-11: 13:00:00 via INTRAVENOUS

## 2017-02-11 MED ORDER — IRINOTECAN HCL CHEMO INJECTION 100 MG/5ML
65.0000 mg/m2 | Freq: Once | INTRAVENOUS | Status: AC
  Administered 2017-02-11: 140 mg via INTRAVENOUS
  Filled 2017-02-11: qty 7

## 2017-02-11 MED ORDER — ATROPINE SULFATE 1 MG/ML IJ SOLN
INTRAMUSCULAR | Status: AC
  Filled 2017-02-11: qty 1

## 2017-02-11 MED ORDER — PROCHLORPERAZINE MALEATE 10 MG PO TABS: ORAL_TABLET | ORAL | 0 refills | Status: AC

## 2017-02-11 MED ORDER — SODIUM CHLORIDE 0.9 % IV SOLN
Freq: Once | INTRAVENOUS | Status: AC
  Administered 2017-02-11: 13:00:00 via INTRAVENOUS
  Filled 2017-02-11: qty 5

## 2017-02-11 MED ORDER — SODIUM CHLORIDE 0.9 % IV SOLN
30.0000 mg/m2 | Freq: Once | INTRAVENOUS | Status: AC
  Administered 2017-02-11: 68 mg via INTRAVENOUS
  Filled 2017-02-11: qty 68

## 2017-02-11 MED ORDER — POTASSIUM CHLORIDE 2 MEQ/ML IV SOLN: Freq: Once | INTRAVENOUS | Status: DC

## 2017-02-11 MED ORDER — POTASSIUM CHLORIDE 2 MEQ/ML IV SOLN
Freq: Once | INTRAVENOUS | Status: AC
  Administered 2017-02-11: 11:00:00 via INTRAVENOUS
  Filled 2017-02-11: qty 1000

## 2017-02-11 NOTE — Progress Notes (Signed)
[facility] Follow up:    Sharon [name], [place] [address]   DIAGNOSIS: Extensive stage (T2b, N2, M1a) small cell lung cancer presented with large left upper lobe lung mass and mediastinal lymphadenopathy as well as malignant left pleural effusion diagnosed in July 2017.  SUMMARY OF ONCOLOGIC HISTORY:  No history exists.   PRIOR THERAPY:  1) Systemic chemotherapy with cisplatin 60 MG/M2 on day 1 and etoposide at 120 MG/M2 on days 1, 2 and 3 with Neulasta support on day 4. She is status post 6 cycles. 2) prophylactic cranial irradiation under the care of Dr. Sondra Come completed on 05/18/2016. 3) palliative radiotherapy to the enlarging left upper lobe lung nodule under the care of Dr. Sondra Come. 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  CURRENT THERAPY: Systemic chemotherapy with cisplatin 30 MG/M2 and irinotecan 65 MG/M2 on days 1 and 8 every 3 weeks. First dose 10/21/2016.Status post 3 cycles.  INTERVAL HISTORY: Sharon [name] 61 y.o. female returns for routine follow-up by herself. Patient is feeling fine today except for fatigue. Resumed chemotherapy last week. The patient denies fevers and chills. Denies chest pain, shortness breath, cough, hemoptysis. Patient reports fair appetite. Appetite is fair. Has lost a few more pounds. Reports constipation but she is using MiraLAX. Denies diarrhea. The patient is here for evaluation prior to cycle 4 day 8 of her chemotherapy.    Patient Active Problem List   Diagnosis Date Noted  . Hypomagnesemia 01/27/2017  . Chemotherapy induced neutropenia ([place]) 12/17/2016  . Hypokalemia 12/17/2016  . Partial small bowel obstruction ([place]) 10/15/2016  . Brain tumor ([place])   . Seizure ([place]) 09/28/2016  . Brain metastasis ([place]) 09/27/2016  . Hemoptysis 08/17/2016  . Personal history of breast cancer 03/29/2016  . Encounter for antineoplastic chemotherapy 01/05/2016  .  Nausea without vomiting 12/15/2015  . Primary small cell carcinoma of left lung ([place]) 11/18/2015  . Mediastinal adenopathy   . Breast cancer ([place]) 07/18/2012  . 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 Smoking 08/18/2011    is allergic to magnesium-containing compounds.  MEDICAL HISTORY: Past Medical History:  Diagnosis Date  . Cancer of right breast ([place]) 2009  . CAP (community acquired pneumonia) 11/06/2015  . Chemotherapy induced neutropenia ([place]) 12/17/2016  . COPD (chronic obstructive pulmonary disease) ([place])   . Elevated blood pressure   . Encounter for antineoplastic chemotherapy 01/05/2016  . History of radiation therapy 05/04/16-05/18/16   whole brain 25 Gy in 10 fractions  . History of radiation therapy 09/01/16 - 09/21/16   Left Lung treated to 35 Gy in 14 fractions  . Hoarseness of voice    "for the last 2 months" (11/06/2015)  . Hypertension   . Hypokalemia 12/17/2016  . Lung mass dx'd 10/2015  . Menopause   . Personal history of breast cancer 03/29/2016  . Pre-diabetes   . Small cell lung cancer ([place])   . Vitamin D deficiency     SURGICAL HISTORY: Past Surgical History:  Procedure Laterality Date  . BREAST BIOPSY Right 2009  . BREAST LUMPECTOMY Right 2009  . IR FLUORO GUIDE PORT INSERTION RIGHT  02/01/2017  . IR US GUIDE VASC ACCESS RIGHT  02/01/2017  . UTERINE FIBROID SURGERY  2000  . VAGINAL HYSTERECTOMY  2000  . VESICOVAGINAL FISTULA CLOSURE W/ TAH  2000  . VIDEO BRONCHOSCOPY WITH ENDOBRONCHIAL ULTRASOUND N/A 11/10/2015   Procedure: VIDEO BRONCHOSCOPY WITH ENDOBRONCHIAL ULTRASOUND;  Surgeon: Javier Glazier, MD;  Location: [facility];  Service: Thoracic;  Laterality: N/A;    SOCIAL HISTORY: Social History   Social History  . Marital status: Divorced    Spouse name: N/A  . Number of children: 3  . 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 Smoking status: Former Smoker    Packs/day: 0.10    Years: 46.00    Types:  Cigarettes    Quit date: 11/11/2015  . Smokeless tobacco: Never Used     Comment: Peak rate of 2ppd  . Alcohol use No  . Drug use: Yes    Types: Marijuana     Comment: 11/06/2015 "1-2 times/week when I do smoke; none lately"  . Sexual activity: Yes   Other Topics Concern  . Not on file   Social History Narrative  . No narrative on file    FAMILY HISTORY: Family History  Problem Relation Age of Onset  . Adopted: Yes  . Cancer Neg Hx     Review of Systems  Constitutional: Positive for fatigue. Negative for chills and fever.  HENT:  Negative.   Respiratory: Negative.   Cardiovascular: Negative.   Gastrointestinal: Negative.   Genitourinary: Negative.    Musculoskeletal: Negative.   Skin: Negative.   Neurological: Negative.   Hematological: Negative.   Psychiatric/Behavioral: Negative.       PHYSICAL EXAMINATION  ECOG PERFORMANCE STATUS: 1 - Symptomatic but completely ambulatory  Vitals:   02/11/17 0823  BP: 111/61  Pulse: 84  Resp: 18  Temp: 98.1 F (36.7 C)  SpO2: 99%    Physical Exam  Constitutional: She is oriented to person, place, and time and well-developed, well-nourished, and in no distress. No distress.  HENT:  Head: Normocephalic and atraumatic.  Mouth/Throat: Oropharynx is clear and moist. No oropharyngeal exudate.  Eyes: Conjunctivae are normal. Right eye exhibits no discharge. Left eye exhibits no discharge. No scleral icterus.  Neck: Normal range of motion. Neck supple.  Cardiovascular: Normal rate, regular rhythm, normal heart sounds and intact distal pulses.   Pulmonary/Chest: Effort normal and breath sounds normal. No respiratory distress. She has no wheezes. She has no rales.  Abdominal: Soft. Bowel sounds are normal. She exhibits no distension and no mass. There is no tenderness.  Musculoskeletal: Normal range of motion. She exhibits no edema.  Lymphadenopathy:    She has no cervical adenopathy.  Neurological: She is alert and oriented  to person, place, and time. She exhibits normal muscle tone. Gait normal. Coordination normal.  Skin: Skin is warm and dry. No rash noted. She is not diaphoretic. No erythema. No pallor.  Psychiatric: Mood, memory, affect and judgment normal.  Vitals reviewed.   LABORATORY DATA:  CBC    Component Value Date/Time   WBC 5.1 02/11/2017 0755   WBC 6.4 02/01/2017 1254   RBC 2.94 (L) 02/11/2017 0755   RBC 2.88 (L) 02/01/2017 1254   HGB 10.1 (L) 02/11/2017 0755   HCT 29.6 (L) 02/11/2017 0755   PLT 130 (L) 02/11/2017 0755   MCV 100.6 02/11/2017 0755   MCH 34.3 (H) 02/11/2017 0755   MCH 33.7 02/01/2017 1254   MCHC 34.1 02/11/2017 0755   MCHC 33.1 02/01/2017 1254   RDW 17.4 (H) 02/11/2017 0755   LYMPHSABS 0.4 (L) 02/11/2017 0755   MONOABS 0.4 02/11/2017 0755   EOSABS 0.1 02/11/2017 0755   BASOSABS 0.0 02/11/2017 0755    CMP     Component Value Date/Time  NA 136 02/11/2017 0755   K 3.5 02/11/2017 0755   CL 98 (L) 01/09/2017 1829   CL 104 07/18/2012 1451   CO2 24 02/11/2017 0755   GLUCOSE 92 02/11/2017 0755   GLUCOSE 99 07/18/2012 1451   BUN 13.8 02/11/2017 0755   CREATININE 0.8 02/11/2017 0755   CALCIUM 9.5 02/11/2017 0755   PROT 7.2 02/11/2017 0755   ALBUMIN 3.1 (L) 02/11/2017 0755   AST 82 (H) 02/11/2017 0755   ALT 34 02/11/2017 0755   ALKPHOS 156 (H) 02/11/2017 0755   BILITOT 0.36 02/11/2017 0755   GFRNONAA >60 01/09/2017 1829   GFRAA >60 01/09/2017 1829   RADIOGRAPHIC STUDIES:  No results found. ASSESSMENT and THERAPY PLAN:   Primary small cell carcinoma of left lung ([place]) This is a very pleasant 61 year old white female with extensive stage small cell lung cancer status post 6 cycles of systemic chemotherapy with cisplatin and etoposide followed by prophylactic cranial irradiation followed by palliative radiotherapy to a left upper lobe pulmonary nodule followed by stereotactic radiotherapy to solitary brain metastasis.This was followed by disease progression  with new bulky left supraclavicular lymphadenopathy as well as new enlarged mediastinal left hilar lymph nodes. The patient was started on systemic chemotherapy with cisplatin 30 MG/M2 and irinotecan 65 MG/M2 on days 1 and 8 every 3 weeks status post 3 cycles. She tolerated her treatment fairly well except for neutropenia as well as intermittent nausea. She had a repeat CT scan of the chest, abdomen and pelvis performed. Her scan showed improvement of the disease in the supraclavicular lymph node as well as mediastinum but there was enlarging left hilar lymph node. She then received palliative radiotherapy to the enlarging left hilar lymph node as well as the persistent supraclavicular lymphadenopathy. Chemotherapy was placed on how during radiation.  Tolerating chemotherapy well. Recommend that she proceed with cycle 4 day 8 of her chemotherapy today as scheduled. The patient will return in 2 weeks for evaluation prior to Cycle 5 Day 1 of chemotherapy.   Compazine refill was sent to her pharmacy today.  The patient was advised to call immediately if she has any concerning symptoms in the interval. The patient voices understanding of current disease status and treatment options and is in agreement with the current care plan. All questions were answered. The patient knows to call the clinic with any problems, questions or concerns. We can certainly see the patient much sooner if necessary.  Hypomagnesemia The patient's magnesium level is low 1.4. She was reminded to take her oral magnesium at home. IV Magnesium in her hydration pre/post chemo was increased.    No orders of the defined types were placed in this encounter.   All questions were answered. The patient knows to call the clinic with any problems, questions or concerns. We can certainly see the patient much sooner if necessary.  Mikey Bussing, NP 02/13/2017

## 2017-02-11 NOTE — Progress Notes (Signed)
Ok to treat with Magnesium of 1.4 per MD [facility]

## 2017-02-11 NOTE — Patient Instructions (Signed)
[facility] Discharge Instructions for Patients Receiving Chemotherapy  Today you received the following chemotherapy agents Irinotecan and Cisplatin   To help prevent nausea and vomiting after your treatment, we encourage you to take your nausea medication as directed. No Zofran for 3 days, take Compazine instead.    If you develop nausea and vomiting that is not controlled by your nausea medication, call the clinic.   BELOW ARE SYMPTOMS THAT SHOULD BE REPORTED IMMEDIATELY:  *FEVER GREATER THAN 100.5 F  *CHILLS WITH OR WITHOUT FEVER  NAUSEA AND VOMITING THAT IS NOT CONTROLLED WITH YOUR NAUSEA MEDICATION  *UNUSUAL SHORTNESS OF BREATH  *UNUSUAL BRUISING OR BLEEDING  TENDERNESS IN MOUTH AND THROAT WITH OR WITHOUT PRESENCE OF ULCERS  *URINARY PROBLEMS  *BOWEL PROBLEMS  UNUSUAL RASH Items with * indicate a potential emergency and should be followed up as soon as possible.  Feel free to call the clinic should you have any questions or concerns. The clinic phone number is [phone].  Please show the [facility] at check-in to the Emergency Department and triage nurse.

## 2017-02-11 NOTE — Progress Notes (Deleted)
[name] Follow up:    [name], [place] [address] [place] [address]   DIAGNOSIS: Cancer Staging Primary small cell carcinoma of left lung [facility]) Staging form: Lung, AJCC 7th Edition - Clinical stage from 11/18/2015: Stage IIIA (T2b, N2, M0) - Signed by Curt Bears, MD on 11/18/2015   SUMMARY OF ONCOLOGIC HISTORY:  No history exists.    CURRENT THERAPY:  INTERVAL HISTORY: Sharon [name] 61 y.o. female returns for    Patient Active Problem List   Diagnosis Date Noted  . Hypomagnesemia 01/27/2017  . Chemotherapy induced neutropenia ([place]) 12/17/2016  . Hypokalemia 12/17/2016  . Partial small bowel obstruction ([place]) 10/15/2016  . Brain tumor ([place])   . Seizure ([place]) 09/28/2016  . Brain metastasis ([place]) 09/27/2016  . Hemoptysis 08/17/2016  . Personal history of breast cancer 03/29/2016  . Encounter for antineoplastic chemotherapy 01/05/2016  . Nausea without vomiting 12/15/2015  . Primary small cell carcinoma of left lung ([place]) 11/18/2015  . Mediastinal adenopathy   . Breast cancer ([place]) 07/18/2012  . 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 Smoking 08/18/2011    is allergic to magnesium-containing compounds.  MEDICAL HISTORY: Past Medical History:  Diagnosis Date  . Cancer of right breast ([place]) 2009  . CAP (community acquired pneumonia) 11/06/2015  . Chemotherapy induced neutropenia ([place]) 12/17/2016  . COPD (chronic obstructive pulmonary disease) ([place])   . Elevated blood pressure   . Encounter for antineoplastic chemotherapy 01/05/2016  . History of radiation therapy 05/04/16-05/18/16   whole brain 25 Gy in 10 fractions  . History of radiation therapy 09/01/16 - 09/21/16   Left Lung treated to 35 Gy in 14 fractions  . Hoarseness of voice    "for the last 2 months" (11/06/2015)  . Hypertension   . Hypokalemia 12/17/2016  . Lung mass dx'd 10/2015  . Menopause   . Personal history of breast cancer 03/29/2016  . Pre-diabetes   . Small  cell lung cancer ([place])   . Vitamin D deficiency     SURGICAL HISTORY: Past Surgical History:  Procedure Laterality Date  . BREAST BIOPSY Right 2009  . BREAST LUMPECTOMY Right 2009  . IR FLUORO GUIDE PORT INSERTION RIGHT  02/01/2017  . IR US GUIDE VASC ACCESS RIGHT  02/01/2017  . UTERINE FIBROID SURGERY  2000  . VAGINAL HYSTERECTOMY  2000  . VESICOVAGINAL FISTULA CLOSURE W/ TAH  2000  . VIDEO BRONCHOSCOPY WITH ENDOBRONCHIAL ULTRASOUND N/A 11/10/2015   Procedure: VIDEO BRONCHOSCOPY WITH ENDOBRONCHIAL ULTRASOUND;  Surgeon: Javier Glazier, MD;  Location: [facility];  Service: Thoracic;  Laterality: N/A;    SOCIAL HISTORY: Social History   Social History  . Marital status: Divorced    Spouse name: N/A  . Number of children: 3  . 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 Smoking status: Former Smoker    Packs/day: 0.10    Years: 46.00    Types: Cigarettes    Quit date: 11/11/2015  . Smokeless tobacco: Never Used     Comment: Peak rate of 2ppd  . Alcohol use No  . Drug use: Yes    Types: Marijuana     Comment: 11/06/2015 "1-2 times/week when I do smoke; none lately"  . Sexual activity: Yes   Other Topics Concern  . Not on file   Social History Narrative  . No narrative on file    FAMILY HISTORY: Family  History  Problem Relation Age of Onset  . Adopted: Yes  . Cancer Neg Hx     Review of Systems - Oncology    PHYSICAL EXAMINATION  ECOG PERFORMANCE STATUS: {CHL ONC ECOG BJ:[id number]}  Vitals:   02/11/17 0823  BP: 111/61  Pulse: 84  Resp: 18  Temp: 98.1 F (36.7 C)  SpO2: 99%    Physical Exam  LABORATORY DATA:  CBC    Component Value Date/Time   WBC 5.1 02/11/2017 0755   WBC 6.4 02/01/2017 1254   RBC 2.94 (L) 02/11/2017 0755   RBC 2.88 (L) 02/01/2017 1254   HGB 10.1 (L) 02/11/2017 0755   HCT 29.6 (L) 02/11/2017 0755   PLT 130 (L) 02/11/2017 0755   MCV 100.6 02/11/2017 0755   MCH 34.3 (H)  02/11/2017 0755   MCH 33.7 02/01/2017 1254   MCHC 34.1 02/11/2017 0755   MCHC 33.1 02/01/2017 1254   RDW 17.4 (H) 02/11/2017 0755   LYMPHSABS 0.4 (L) 02/11/2017 0755   MONOABS 0.4 02/11/2017 0755   EOSABS 0.1 02/11/2017 0755   BASOSABS 0.0 02/11/2017 0755    CMP     Component Value Date/Time   NA 138 02/04/2017 0800   K 3.3 (L) 02/04/2017 0800   CL 98 (L) 01/09/2017 1829   CL 104 07/18/2012 1451   CO2 22 02/04/2017 0800   GLUCOSE 111 02/04/2017 0800   GLUCOSE 99 07/18/2012 1451   BUN 7.3 02/04/2017 0800   CREATININE 0.8 02/04/2017 0800   CALCIUM 9.4 02/04/2017 0800   PROT 6.7 02/04/2017 0800   ALBUMIN 2.7 (L) 02/04/2017 0800   AST 45 (H) 02/04/2017 0800   ALT 21 02/04/2017 0800   ALKPHOS 120 02/04/2017 0800   BILITOT 0.28 02/04/2017 0800   GFRNONAA >60 01/09/2017 1829   GFRAA >60 01/09/2017 1829       PENDING LABS:   RADIOGRAPHIC STUDIES:  Mr Jeri Cos Wo Contrast  Result Date: 02/10/2017 CLINICAL DATA:  Breast cancer, continued surveillance. Four month restaging. EXAM: MRI HEAD WITHOUT AND WITH CONTRAST TECHNIQUE: Multiplanar, multiecho pulse sequences of the brain and surrounding structures were obtained without and with intravenous contrast. CONTRAST:  103mL MULTIHANCE GADOBENATE DIMEGLUMINE 529 MG/ML IV SOLN COMPARISON:  09/30/2016 MRI. FINDINGS: There is slight motion degradation, on axial 1 mm slices. There may be slight misregistration as well among post infusion images. Brain: Significant improvement in the treated LEFT posterior frontal metastasis, with corresponding decrease in both size and surrounding edema. Current measurements are 7 x 7 x 7 mm (10/128). Multiple new metastatic lesions are identified. As identified on post contrast axial T1 weighted images series 10, these include - LEFT medial cerebellum, 1 mm, image 29. -LEFT paramedian vermis, 2 mm, image 44. -LEFT hypothalamus, 2 mm, image 73. -Query superficial RIGHT occipital lobe, 2 mm, image 79  (difficult to identify on sagittal and coronal imaging). -medial LEFT occipital lobe, 3 mm, image 80 . - midline splenium corpus callosum, 2 mm, image 81. -Medial RIGHT third ventricle, 2 x 5 mm, image 81. -Superficial RIGHT parietal lobe, 2 mm, image 87. LEFT inferior frontal operculum, 1 mm, should be on image 90, seen only on sagittal and coronal series. -Medial RIGHT frontal lobe/cingulate gyrus, 3 mm, image 116. Vascular: Normal flow voids. Skull and upper cervical spine: Normal marrow signal. Sinuses/Orbits: Negative. Other: None. IMPRESSION: Improvement in the previously treated LEFT posterior frontal metastasis. At least 9 new enhancing subcentimeter metastatic lesions are documented in the brain. See discussion above. Possible 10th lesion,  superficial RIGHT occipital lobe as seen on image 79. Difficult to identify on sagittal/coronal imaging and may be a vessel. Electronically Signed   By: Staci Righter M.D.   On: 02/10/2017 10:39     PATHOLOGY:     ASSESSMENT and THERAPY PLAN:   No problem-specific Assessment & Plan notes found for this encounter.   No orders of the defined types were placed in this encounter.   All questions were answered. The patient knows to call the clinic with any problems, questions or concerns. We can certainly see the patient much sooner if necessary. This note was electronically signed. Mikey Bussing, NP 02/11/2017

## 2017-02-13 ENCOUNTER — Encounter: Payer: Self-pay | Admitting: Oncology

## 2017-02-13 NOTE — Assessment & Plan Note (Signed)
The patient's magnesium level is low 1.4. She was reminded to take her oral magnesium at home. IV Magnesium in her hydration pre/post chemo was increased.

## 2017-02-13 NOTE — Assessment & Plan Note (Signed)
This is a very pleasant 61 year old white female with extensive stage small cell lung cancer status post 6 cycles of systemic chemotherapy with cisplatin and etoposide followed by prophylactic cranial irradiation followed by palliative radiotherapy to a left upper lobe pulmonary nodule followed by stereotactic radiotherapy to solitary brain metastasis.This was followed by disease progression with new bulky left supraclavicular lymphadenopathy as well as new enlarged mediastinal left hilar lymph nodes. The patient was started on systemic chemotherapy with cisplatin 30 MG/M2 and irinotecan 65 MG/M2 on days 1 and 8 every 3 weeks status post 3 cycles. She tolerated her treatment fairly well except for neutropenia as well as intermittent nausea. She had a repeat CT scan of the chest, abdomen and pelvis performed. Her scan showed improvement of the disease in the supraclavicular lymph node as well as mediastinum but there was enlarging left hilar lymph node. She then received palliative radiotherapy to the enlarging left hilar lymph node as well as the persistent supraclavicular lymphadenopathy. Chemotherapy was placed on how during radiation.  Tolerating chemotherapy well. Recommend that she proceed with cycle 4 day 8 of her chemotherapy today as scheduled. The patient will return in 2 weeks for evaluation prior to Cycle 5 Day 1 of chemotherapy.   Compazine refill was sent to her pharmacy today.  The patient was advised to call immediately if she has any concerning symptoms in the interval. The patient voices understanding of current disease status and treatment options and is in agreement with the current care plan. All questions were answered. The patient knows to call the clinic with any problems, questions or concerns. We can certainly see the patient much sooner if necessary.

## 2017-02-15 ENCOUNTER — Ambulatory Visit
Admission: RE | Admit: 2017-02-15 | Discharge: 2017-02-15 | Disposition: A | Discharge status: Home / Self Care | Payer: 59 | Source: Ambulatory Visit | Attending: Radiation Oncology | Admitting: Radiation Oncology

## 2017-02-15 ENCOUNTER — Encounter: Payer: Self-pay | Admitting: Radiation Oncology

## 2017-02-15 VITALS — BP 114/66 | HR 104 | Temp 97.8°F | Resp 18 | Ht 66.0 in | Wt 197.4 lb

## 2017-02-15 DIAGNOSIS — Z79899 Other long term (current) drug therapy: Secondary | ICD-10-CM | POA: Insufficient documentation

## 2017-02-15 DIAGNOSIS — Z7951 Long term (current) use of inhaled steroids: Secondary | ICD-10-CM | POA: Diagnosis not present

## 2017-02-15 DIAGNOSIS — E559 Vitamin D deficiency, unspecified: Secondary | ICD-10-CM | POA: Diagnosis not present

## 2017-02-15 DIAGNOSIS — Z8701 Personal history of pneumonia (recurrent): Secondary | ICD-10-CM | POA: Diagnosis not present

## 2017-02-15 DIAGNOSIS — Z87891 Personal history of nicotine dependence: Secondary | ICD-10-CM | POA: Insufficient documentation

## 2017-02-15 DIAGNOSIS — Z90710 Acquired absence of both cervix and uterus: Secondary | ICD-10-CM | POA: Diagnosis not present

## 2017-02-15 DIAGNOSIS — Z923 Personal history of irradiation: Secondary | ICD-10-CM | POA: Insufficient documentation

## 2017-02-15 DIAGNOSIS — Z853 Personal history of malignant neoplasm of breast: Secondary | ICD-10-CM | POA: Diagnosis not present

## 2017-02-15 DIAGNOSIS — Z9221 Personal history of antineoplastic chemotherapy: Secondary | ICD-10-CM | POA: Diagnosis not present

## 2017-02-15 DIAGNOSIS — I10 Essential (primary) hypertension: Secondary | ICD-10-CM | POA: Diagnosis not present

## 2017-02-15 DIAGNOSIS — Z510 Encounter for antineoplastic radiation therapy: Secondary | ICD-10-CM | POA: Diagnosis not present

## 2017-02-15 DIAGNOSIS — C7931 Secondary malignant neoplasm of brain: Secondary | ICD-10-CM | POA: Diagnosis not present

## 2017-02-15 DIAGNOSIS — J449 Chronic obstructive pulmonary disease, unspecified: Secondary | ICD-10-CM | POA: Insufficient documentation

## 2017-02-15 DIAGNOSIS — C3492 Malignant neoplasm of unspecified part of left bronchus or lung: Secondary | ICD-10-CM

## 2017-02-15 DIAGNOSIS — Z85118 Personal history of other malignant neoplasm of bronchus and lung: Secondary | ICD-10-CM | POA: Insufficient documentation

## 2017-02-15 DIAGNOSIS — R7303 Prediabetes: Secondary | ICD-10-CM | POA: Diagnosis not present

## 2017-02-15 NOTE — Progress Notes (Signed)
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  10/07/16 SRS Treatment: PTV1 target was treated using 4 Arcs to a prescription dose of 18 Gy. ExacTrac Snap verification was performed for each couch angle.   09/01/16-09/21/16: palliative radiotherapy to the chest 35 Gy in 14 fractions  05/04/16-05/18/16 PCI: 25 Gy in 10 fractions to the whole brain   Narrative: Sharon [name] is a pleasant 61 y.o. female with a history of extensive stage small cel carcinoma of the left lung. She completed platin/etoposide chemotherapy in November 2017, followed by PCI to the brain in January this year. She received palliative radiotherapy to the chest in May 2018, and received salvage SRS to the brain in June 2018. She began cisplatin/irinotecan in 10/29/16. She completed 3 cycles, and decided to take a break from therapy until resuming (after placement of a PAC)  On 02/04/17. Her MRI of the brain on 09/29/16 revealed 9 new lesions which prompted her [place] treatment. She had a post treatment scan ultimately on 02/10/17 which was delayed as well due to the patient wanting to have a PAC placed prior to having this. Unfortunately this appears to reveal about 10 new lesions in bilateral hemispheres and the largest of these is 7 mm. She comes to review this. I have also spoken with her medical oncologist Dr. Julien Nordmann who may consider changing her regimen to Temodar.  On review of systems, the patient reports that she is doing well overall. She is not having any headaches, dizziness, changes in visual or  denies any chest pain, shortness of breath, cough, fevers,  chills, night sweats, unintended weight changes. She denies any bowel or bladder disturbances, and denies abdominal pain, nausea or vomiting. She complains of fatigue from chemotherapy. She denies any new musculoskeletal or joint aches or pains, new skin lesions or concerns. A complete review of systems is obtained and is otherwise negative.  Past Medical History:  Past Medical History:  Diagnosis Date  . Cancer of right breast ([place]) 2009  . CAP (community acquired pneumonia) 11/06/2015  . Chemotherapy induced neutropenia ([place]) 12/17/2016  . COPD (chronic obstructive pulmonary disease) ([place])   . Elevated blood pressure   . Encounter for antineoplastic chemotherapy 01/05/2016  . History of radiation therapy 05/04/16-05/18/16   whole brain 25 Gy in 10 fractions  . History of radiation therapy 09/01/16 - 09/21/16   Left Lung treated to 35 Gy in 14 fractions  . Hoarseness of voice    "for the last 2 months" (11/06/2015)  . Hypertension   . Hypokalemia 12/17/2016  . Lung mass dx'd 10/2015  . Menopause   . Personal history of breast cancer 03/29/2016  . Pre-diabetes   . Small cell lung cancer ([place])   . Vitamin D deficiency     Past Surgical History: Past Surgical History:  Procedure Laterality Date  . BREAST BIOPSY Right 2009  . BREAST LUMPECTOMY Right 2009  . IR FLUORO GUIDE PORT INSERTION RIGHT  02/01/2017  . IR US GUIDE VASC ACCESS RIGHT  02/01/2017  . UTERINE FIBROID SURGERY  2000  . VAGINAL HYSTERECTOMY  2000  . VESICOVAGINAL FISTULA CLOSURE W/ TAH  2000  . VIDEO BRONCHOSCOPY WITH ENDOBRONCHIAL ULTRASOUND N/A 11/10/2015   Procedure: VIDEO BRONCHOSCOPY WITH ENDOBRONCHIAL ULTRASOUND;  Surgeon: Javier Glazier, MD;  Location: [facility];  Service: Thoracic;  Laterality: N/A;    Social History:  Social History   Social History  . Marital status: Divorced    Spouse name: N/A  . Number of children: 3  . Years of education: N/A   Occupational History  . [employer]wn[employer]     Social History Main  Topics  . Smoking status: Former Smoker    Packs/day: 0.10    Years: 46.00    Types: Cigarettes    Quit date: 11/11/2015  . Smokeless tobacco: Never Used     Comment: Peak rate of 2ppd  . Alcohol use No  . Drug use: Yes    Types: Marijuana     Comment: 11/06/2015 "1-2 times/week when I do smoke; none lately"  . Sexual activity: Yes   Other Topics Concern  . Not on file   Social History Narrative  . No narrative on file    Family History: Family History  Problem Relation Age of Onset  . Adopted: Yes  . Cancer Neg Hx      ALLERGIES:  is allergic to magnesium-containing compounds.  Meds: Current Outpatient Prescriptions  Medication Sig Dispense Refill  . albuterol (PROVENTIL HFA;VENTOLIN HFA) 108 (90 Base) MCG/ACT inhaler Inhale 1 puff into the lungs every 6 (six) hours as needed for wheezing or shortness of breath.    . budesonide-formoterol (SYMBICORT) 160-4.5 MCG/ACT inhaler Inhale 2 puffs into the lungs 2 (two) times daily. 1 Inhaler 11  . ferrous sulfate 325 (65 FE) MG EC tablet Take 325 mg by mouth daily.    . fluticasone (FLONASE) 50 MCG/ACT nasal spray Place 1 spray into both nostrils daily.     . Ipratropium-Albuterol (COMBIVENT RESPIMAT) 20-100 MCG/ACT AERS respimat Inhale 1 puff into the lungs every 6 (six) hours.    . lidocaine-prilocaine (EMLA) cream Apply 1 application topically as needed. 30 g 2  . loperamide (IMODIUM) 2 MG capsule Take by mouth as needed for diarrhea or loose stools.    Marland Kitchen LORazepam (ATIVAN) 0.5 MG tablet 1 tablet po 30 minutes prior to radiation or MRI 30 tablet 0  . magnesium oxide (MAG-OX) 400 (241.3 Mg) MG tablet Take 1 tablet (400 mg total) by mouth 4 (four) times daily. 120 tablet 0  . prochlorperazine (COMPAZINE) 10 MG tablet TAKE 1 TABLET BY MOUTH EVERY 6 HOURS AS NEEDED FOR NAUSEA AND VOMITING 90 tablet 0  . sucralfate (CARAFATE) 1 GM/10ML suspension Take 10 mLs (1 g total) by mouth 4 (four) times daily -  with meals and at bedtime. 420  mL 0  . meclizine (ANTIVERT) 12.5 MG tablet Take 1-2 tablets (12.5-25 mg total) by mouth 3 (three) times daily as needed for dizziness. (Patient not taking: Reported on 12/29/2016) 20 tablet 0  . montelukast (SINGULAIR) 10 MG tablet Take 10 mg by mouth at bedtime.   2  . oxyCODONE-acetaminophen (PERCOCET/ROXICET) 5-325 MG tablet Take 1 tablet by mouth every 4 (four) hours as needed for severe pain. (Patient not taking: Reported on 02/10/2017) 30 tablet 0  . polyethylene glycol powder (GLYCOLAX/MIRALAX) powder Take 1 Container by mouth daily.    Marland Kitchen SANTYL ointment APP TOPICALLY UTD  0  . senna (SENOKOT) 8.6 MG TABS tablet Take 1 tablet (8.6 mg total) by mouth daily. (Patient not taking: Reported on  02/15/2017) 30 tablet 0  . UNABLE TO FIND Take 10 mLs by mouth daily as needed. Med Name: CBD Oil     No current facility-administered medications for this encounter.     Physical Findings:  height is _0  (1.676 m) and weight is 197 lb 6.4 oz (89.5 kg). Her oral temperature is 97.8 F (36.6 C). Her blood pressure is 114/66 and her pulse is 104 (abnormal). Her respiration is 18 and oxygen saturation is 98%.  Pain Assessment Pain Score: 0-No pain/10 In general this is a well appearing Caucasian female in no acute distress. She's alert and oriented x4 and appropriate throughout the examination. Cardiopulmonary assessment is negative for acute distress and she exhibits normal effort. She is neurologically intact grossly. Lab Findings: Lab Results  Component Value Date   WBC 5.1 02/11/2017   HGB 10.1 (L) 02/11/2017   HCT 29.6 (L) 02/11/2017   MCV 100.6 02/11/2017   PLT 130 (L) 02/11/2017     Radiographic Findings: Mr Jeri Cos WP Contrast  Result Date: 02/10/2017 CLINICAL DATA:  Breast cancer, continued surveillance. Four month restaging. EXAM: MRI HEAD WITHOUT AND WITH CONTRAST TECHNIQUE: Multiplanar, multiecho pulse sequences of the brain and surrounding structures were obtained without and with  intravenous contrast. CONTRAST:  36m MULTIHANCE GADOBENATE DIMEGLUMINE 529 MG/ML IV SOLN COMPARISON:  09/30/2016 MRI. FINDINGS: There is slight motion degradation, on axial 1 mm slices. There may be slight misregistration as well among post infusion images. Brain: Significant improvement in the treated LEFT posterior frontal metastasis, with corresponding decrease in both size and surrounding edema. Current measurements are 7 x 7 x 7 mm (10/128). Multiple new metastatic lesions are identified. As identified on post contrast axial T1 weighted images series 10, these include - LEFT medial cerebellum, 1 mm, image 29. -LEFT paramedian vermis, 2 mm, image 44. -LEFT hypothalamus, 2 mm, image 73. -Query superficial RIGHT occipital lobe, 2 mm, image 79 (difficult to identify on sagittal and coronal imaging). -medial LEFT occipital lobe, 3 mm, image 80 . - midline splenium corpus callosum, 2 mm, image 81. -Medial RIGHT third ventricle, 2 x 5 mm, image 81. -Superficial RIGHT parietal lobe, 2 mm, image 87. LEFT inferior frontal operculum, 1 mm, should be on image 90, seen only on sagittal and coronal series. -Medial RIGHT frontal lobe/cingulate gyrus, 3 mm, image 116. Vascular: Normal flow voids. Skull and upper cervical spine: Normal marrow signal. Sinuses/Orbits: Negative. Other: None. IMPRESSION: Improvement in the previously treated LEFT posterior frontal metastasis. At least 9 new enhancing subcentimeter metastatic lesions are documented in the brain. See discussion above. Possible 10th lesion, superficial RIGHT occipital lobe as seen on image 79. Difficult to identify on sagittal/coronal imaging and may be a vessel. Electronically Signed   By: JStaci RighterM.D.   On: 02/10/2017 10:39   Ir [place]Guide Vasc Access Right  Result Date: 02/01/2017 INDICATION: History of metastatic lung cancer. In need of durable intravenous access for chemotherapy administration. EXAM: IMPLANTED PORT A CATH PLACEMENT WITH ULTRASOUND AND  FLUOROSCOPIC GUIDANCE COMPARISON:  Chest CT - 01/09/2017 MEDICATIONS: Ancef 2 gm IV; The antibiotic was administered within an appropriate time interval prior to skin puncture. ANESTHESIA/SEDATION: Moderate (conscious) sedation was employed during this procedure. A total of Versed 2 mg and Fentanyl 100 mcg was administered intravenously. Moderate Sedation Time: 28 minutes. The patient's level of consciousness and vital signs were monitored continuously by radiology nursing throughout the procedure under my direct supervision. CONTRAST:  None FLUOROSCOPY TIME:  48 seconds (12  mGy) COMPLICATIONS: None immediate. PROCEDURE: The procedure, risks, benefits, and alternatives were explained to the patient. Questions regarding the procedure were encouraged and answered. The patient understands and consents to the procedure. The right neck and chest were prepped with chlorhexidine in a sterile fashion, and a sterile drape was applied covering the operative field. Maximum barrier sterile technique with sterile gowns and gloves were used for the procedure. A timeout was performed prior to the initiation of the procedure. Local anesthesia was provided with 1% lidocaine with epinephrine. After creating a small venotomy incision, a micropuncture kit was utilized to access the internal jugular vein. Real-time ultrasound guidance was utilized for vascular access including the acquisition of a permanent ultrasound image documenting patency of the accessed vessel. The microwire was utilized to measure appropriate catheter length. A subcutaneous port pocket was then created along the upper chest wall utilizing a combination of sharp and blunt dissection. The pocket was irrigated with sterile saline. A single lumen ISP power injectable port was chosen for placement. The 8 Fr catheter was tunneled from the port pocket site to the venotomy incision. The port was placed in the pocket. The external catheter was trimmed to appropriate  length. At the venotomy, an 8 Fr peel-away sheath was placed over a guidewire under fluoroscopic guidance. The catheter was then placed through the sheath and the sheath was removed. Final catheter positioning was confirmed and documented with a fluoroscopic spot radiograph. The port was accessed with a Huber needle, aspirated and flushed with heparinized saline. The venotomy site was closed with an interrupted 4-0 Vicryl suture. The port pocket incision was closed with interrupted 2-0 Vicryl suture and the skin was opposed with a running subcuticular 4-0 Vicryl suture. Dermabond and Steri-strips were applied to both incisions. Dressings were placed. The patient tolerated the procedure well without immediate post procedural complication. FINDINGS: After catheter placement, the tip lies within the superior cavoatrial junction. The catheter aspirates and flushes normally and is ready for immediate use. IMPRESSION: Successful placement of a right internal jugular approach power injectable Port-A-Cath. The catheter is ready for immediate use. Electronically Signed   By: Sandi Mariscal M.D.   On: 02/01/2017 16:14   Ir Fluoro Guide Port Insertion Right  Result Date: 02/01/2017 INDICATION: History of metastatic lung cancer. In need of durable intravenous access for chemotherapy administration. EXAM: IMPLANTED PORT A CATH PLACEMENT WITH ULTRASOUND AND FLUOROSCOPIC GUIDANCE COMPARISON:  Chest CT - 01/09/2017 MEDICATIONS: Ancef 2 gm IV; The antibiotic was administered within an appropriate time interval prior to skin puncture. ANESTHESIA/SEDATION: Moderate (conscious) sedation was employed during this procedure. A total of Versed 2 mg and Fentanyl 100 mcg was administered intravenously. Moderate Sedation Time: 28 minutes. The patient's level of consciousness and vital signs were monitored continuously by radiology nursing throughout the procedure under my direct supervision. CONTRAST:  None FLUOROSCOPY TIME:  48 seconds (12  mGy) COMPLICATIONS: None immediate. PROCEDURE: The procedure, risks, benefits, and alternatives were explained to the patient. Questions regarding the procedure were encouraged and answered. The patient understands and consents to the procedure. The right neck and chest were prepped with chlorhexidine in a sterile fashion, and a sterile drape was applied covering the operative field. Maximum barrier sterile technique with sterile gowns and gloves were used for the procedure. A timeout was performed prior to the initiation of the procedure. Local anesthesia was provided with 1% lidocaine with epinephrine. After creating a small venotomy incision, a micropuncture kit was utilized to access the internal jugular vein.  Real-time ultrasound guidance was utilized for vascular access including the acquisition of a permanent ultrasound image documenting patency of the accessed vessel. The microwire was utilized to measure appropriate catheter length. A subcutaneous port pocket was then created along the upper chest wall utilizing a combination of sharp and blunt dissection. The pocket was irrigated with sterile saline. A single lumen ISP power injectable port was chosen for placement. The 8 Fr catheter was tunneled from the port pocket site to the venotomy incision. The port was placed in the pocket. The external catheter was trimmed to appropriate length. At the venotomy, an 8 Fr peel-away sheath was placed over a guidewire under fluoroscopic guidance. The catheter was then placed through the sheath and the sheath was removed. Final catheter positioning was confirmed and documented with a fluoroscopic spot radiograph. The port was accessed with a Huber needle, aspirated and flushed with heparinized saline. The venotomy site was closed with an interrupted 4-0 Vicryl suture. The port pocket incision was closed with interrupted 2-0 Vicryl suture and the skin was opposed with a running subcuticular 4-0 Vicryl suture. Dermabond  and Steri-strips were applied to both incisions. Dressings were placed. The patient tolerated the procedure well without immediate post procedural complication. FINDINGS: After catheter placement, the tip lies within the superior cavoatrial junction. The catheter aspirates and flushes normally and is ready for immediate use. IMPRESSION: Successful placement of a right internal jugular approach power injectable Port-A-Cath. The catheter is ready for immediate use. Electronically Signed   By: Sandi Mariscal M.D.   On: 02/01/2017 16:14    Impression/Plan: 1. Extensive stage small cell carcinoma of the left upper lobe with metastatic disease to the brain.  The patient's MRI results were reviewed and Dr. Ida Rogue recommendation for [facility] treatment from conference was offered after she confirmed that she would like to remain aggressive in her cancer treatment strategy. She is counseled on the risks, benefits, short and long term effects of therapy and would like to move forward with SRS. Dr. Julien Nordmann also shared that she may be a candidate for Temodar oral chemotherapy. The timing of initiating this versus remaining on her current regimen will be deferred to her discussion of this with him. She will return for simulation and consent this week. 2. Goals of care. The patient is receiving home health care, and since her other option of care is to change goals to focus on comfort care, a compromise with her wanting aggressive therapies would be to offer a palliative care consultation. She is in agreement to meet with them, and a referral will be placed.    In a visit lasting 35 minutes, greater than 50% of the time was spent face to face discussing her MRI results, reviewing her case overall and including her daughter by phone in discussion, while coordinating the patient's care.     Carola Rhine, PAC

## 2017-02-16 ENCOUNTER — Ambulatory Visit: Payer: 59

## 2017-02-16 ENCOUNTER — Ambulatory Visit: Payer: 59 | Admitting: Radiation Oncology

## 2017-02-16 ENCOUNTER — Ambulatory Visit: Admission: RE | Admit: 2017-02-16 | Payer: 59 | Source: Ambulatory Visit | Admitting: Radiation Oncology

## 2017-02-17 ENCOUNTER — Ambulatory Visit
Admission: RE | Admit: 2017-02-17 | Discharge: 2017-02-17 | Disposition: A | Discharge status: Home / Self Care | Payer: 59 | Source: Ambulatory Visit | Attending: Radiation Oncology | Admitting: Radiation Oncology

## 2017-02-17 ENCOUNTER — Ambulatory Visit: Admission: RE | Admit: 2017-02-17 | Payer: 59 | Source: Ambulatory Visit | Admitting: Radiation Oncology

## 2017-02-17 VITALS — BP 119/85 | HR 112 | Temp 98.0°F | Resp 20 | Ht 66.0 in | Wt 190.8 lb

## 2017-02-17 DIAGNOSIS — C7931 Secondary malignant neoplasm of brain: Secondary | ICD-10-CM

## 2017-02-17 DIAGNOSIS — D496 Neoplasm of unspecified behavior of brain: Secondary | ICD-10-CM

## 2017-02-17 DIAGNOSIS — Z510 Encounter for antineoplastic radiation therapy: Secondary | ICD-10-CM | POA: Diagnosis not present

## 2017-02-17 MED ORDER — SODIUM CHLORIDE 0.9% FLUSH
10.0000 mL | Freq: Once | INTRAVENOUS | Status: AC
  Administered 2017-02-17: 10 mL via INTRAVENOUS

## 2017-02-17 MED ORDER — HEPARIN SOD (PORK) LOCK FLUSH 100 UNIT/ML IV SOLN
500.0000 [IU] | Freq: Once | INTRAVENOUS | Status: AC
  Administered 2017-02-17: 500 [IU] via INTRAVENOUS

## 2017-02-17 NOTE — Progress Notes (Signed)
Has armband been applied?  yes  Does patient have an allergy to IV contrast dye?: NO   Has patient ever received premedication for IV contrast dye?: NO   Does patient take metformin?: No  If patient does take metformin when was the last dose: N/A  Date of lab work: 02/11/17 BUN: 13.8 CR: 0.8, EFGR>60  IV site: Right  Power port accessed ,excellent blood return,  Has IV site been added to flowsheet?  yes

## 2017-02-21 ENCOUNTER — Telehealth: Payer: Self-pay | Admitting: *Deleted

## 2017-02-21 ENCOUNTER — Telehealth: Payer: Self-pay | Admitting: Oncology

## 2017-02-21 DIAGNOSIS — Z510 Encounter for antineoplastic radiation therapy: Secondary | ICD-10-CM | POA: Diagnosis not present

## 2017-02-21 NOTE — Telephone Encounter (Signed)
"  I called a nurse and was transferred to you because week before last on the 18 th I had chemotherapy.  Having diarrhea off and on the last two weeks every five to six hours, watery diarrhea.  It's been a while since I took liquid imodium.  Maybe the day before yesterday was the last day.  Two BM's so far today with last BM at 12:00 today.  I'm also coughing mucus.  No fever.  Was told I have a cold or allergies.  A nurse there told me to try Mucinex.  I also tried an allergy pill.  No nausea, just coughing up clear mucus.  I'm drinking a lot of water.  Eating things like macaroni and cheese, celery, apples, green tea.  I drink lots of water."  Reviewed Imodium use for patients receiving carboplatin. Begin use with next BM.  Goal is to continue until experiences twelve hours without a BM.  Call with update or if diarrhea increases/worsens.  Routing call information to collaborative nurse and provider for review.  Further patient communication through collaborative nurse.

## 2017-02-21 NOTE — Telephone Encounter (Signed)
Patient called to resche

## 2017-02-22 ENCOUNTER — Encounter: Payer: Self-pay | Admitting: Radiation Oncology

## 2017-02-22 ENCOUNTER — Ambulatory Visit
Admission: RE | Admit: 2017-02-22 | Discharge: 2017-02-22 | Disposition: A | Discharge status: Home / Self Care | Payer: 59 | Source: Ambulatory Visit | Attending: Radiation Oncology | Admitting: Radiation Oncology

## 2017-02-22 VITALS — BP 99/68 | HR 119 | Temp 98.4°F | Resp 20

## 2017-02-22 DIAGNOSIS — Z510 Encounter for antineoplastic radiation therapy: Secondary | ICD-10-CM | POA: Diagnosis not present

## 2017-02-22 DIAGNOSIS — C7931 Secondary malignant neoplasm of brain: Secondary | ICD-10-CM

## 2017-02-22 NOTE — Op Note (Signed)
  Name: Sharon [name]  MRN: [id number]  Date: 02/22/2017   DOB: 1956/03/17  Stereotactic Radiosurgery Operative Note  PRE-OPERATIVE DIAGNOSIS:  Multiple Brain Metastases  POST-OPERATIVE DIAGNOSIS:  Multiple Brain Metastases  PROCEDURE:  Stereotactic Radiosurgery  SURGEON:  Peggyann Shoals, MD  NARRATIVE: The patient underwent a radiation treatment planning session in the radiation oncology simulation suite under the care of the radiation oncology physician and physicist.  I participated closely in the radiation treatment planning afterwards. The patient underwent planning CT which was fused to 3T high resolution MRI with 1 mm axial slices.  These images were fused on the planning system.  We contoured the gross target volumes and subsequently expanded this to yield the Planning Target Volume. I actively participated in the planning process.  I helped to define and review the target contours and also the contours of the optic pathway, eyes, brainstem and selected nearby organs at risk.  All the dose constraints for critical structures were reviewed and compared to AAPM Task Group 101.  The prescription dose conformity was reviewed.  I approved the plan electronically.    Accordingly, Sharon [name] was brought to the TrueBeam stereotactic radiation treatment linac and placed in the custom immobilization mask.  The patient was aligned according to the IR fiducial markers with BrainLab Exactrac, then orthogonal x-rays were used in ExacTrac with the 6DOF robotic table and the shifts were made to align the patient  Sharon [name] received stereotactic radiosurgery uneventfully.    Lesions treated:  9   Complex lesions treated:  0 (>3.5 cm, <59mm of optic path, or within the brainstem)   The detailed description of the procedure is recorded in the radiation oncology procedure note.  I was present for the duration of the procedure.  DISPOSITION:  Following delivery, the patient was transported to nursing  in stable condition and monitored for possible acute effects to be discharged to home in stable condition with follow-up in one month.  Peggyann Shoals, MD 02/22/2017 2:31 PM

## 2017-02-22 NOTE — Progress Notes (Signed)
Vitals taken after SRS brain , to monitor patient 30 minutes, no c/o headache,nausea, blurry vision, no tinnitis, or light headed, Vss, patient to call if any symtoms occur once home such as increased fever >101, profuse vomiting, increased Headache not relieved with tylenol,, offered water, tinea versicolor. And blanket, all declined except water, 2:51 PM

## 2017-02-24 ENCOUNTER — Ambulatory Visit: Payer: 59 | Admitting: Oncology

## 2017-02-25 ENCOUNTER — Ambulatory Visit (HOSPITAL_BASED_OUTPATIENT_CLINIC_OR_DEPARTMENT_OTHER): Payer: 59

## 2017-02-25 ENCOUNTER — Other Ambulatory Visit: Payer: 59

## 2017-02-25 ENCOUNTER — Other Ambulatory Visit (HOSPITAL_BASED_OUTPATIENT_CLINIC_OR_DEPARTMENT_OTHER): Payer: 59

## 2017-02-25 ENCOUNTER — Ambulatory Visit: Payer: 59 | Admitting: Oncology

## 2017-02-25 ENCOUNTER — Ambulatory Visit (HOSPITAL_BASED_OUTPATIENT_CLINIC_OR_DEPARTMENT_OTHER): Payer: 59 | Admitting: Oncology

## 2017-02-25 VITALS — BP 112/74 | HR 96 | Temp 98.1°F | Resp 17 | Ht 66.0 in | Wt 195.2 lb

## 2017-02-25 DIAGNOSIS — C3412 Malignant neoplasm of upper lobe, left bronchus or lung: Secondary | ICD-10-CM

## 2017-02-25 DIAGNOSIS — D701 Agranulocytosis secondary to cancer chemotherapy: Secondary | ICD-10-CM | POA: Diagnosis not present

## 2017-02-25 DIAGNOSIS — E876 Hypokalemia: Secondary | ICD-10-CM | POA: Diagnosis not present

## 2017-02-25 DIAGNOSIS — C7951 Secondary malignant neoplasm of bone: Secondary | ICD-10-CM

## 2017-02-25 DIAGNOSIS — Z95828 Presence of other vascular implants and grafts: Secondary | ICD-10-CM

## 2017-02-25 DIAGNOSIS — C3492 Malignant neoplasm of unspecified part of left bronchus or lung: Secondary | ICD-10-CM

## 2017-02-25 DIAGNOSIS — Z5111 Encounter for antineoplastic chemotherapy: Secondary | ICD-10-CM

## 2017-02-25 LAB — COMPREHENSIVE METABOLIC PANEL
ALT: 14 U/L (ref 0–55)
ANION GAP: 14 meq/L — AB (ref 3–11)
AST: 10 U/L (ref 5–34)
Albumin: 2.9 g/dL — ABNORMAL LOW (ref 3.5–5.0)
Alkaline Phosphatase: 121 U/L (ref 40–150)
BUN: 12.1 mg/dL (ref 7.0–26.0)
CALCIUM: 9.4 mg/dL (ref 8.4–10.4)
CHLORIDE: 92 meq/L — AB (ref 98–109)
CO2: 23 mEq/L (ref 22–29)
CREATININE: 0.9 mg/dL (ref 0.6–1.1)
EGFR: >60 mL/min/{1.73_m2} (ref >60)
Glucose: 109 mg/dl (ref 70–140)
Potassium: 2.8 mEq/L — CL (ref 3.5–5.1)
Sodium: 129 mEq/L — ABNORMAL LOW (ref 136–145)
Total Bilirubin: 0.28 mg/dL (ref 0.20–1.20)
Total Protein: 6.6 g/dL (ref 6.4–8.3)

## 2017-02-25 LAB — CBC WITH DIFFERENTIAL/PLATELET
BASO%: 0.5 % (ref 0.0–2.0)
BASOS ABS: 0 10*3/uL (ref 0.0–0.1)
EOS ABS: 0.1 10*3/uL (ref 0.0–0.5)
EOS%: 2.7 % (ref 0.0–7.0)
HEMATOCRIT: 27.2 % — AB (ref 34.8–46.6)
HGB: 9.6 g/dL — ABNORMAL LOW (ref 11.6–15.9)
LYMPH#: 0.4 10*3/uL — AB (ref 0.9–3.3)
LYMPH%: 15.5 % (ref 14.0–49.7)
MCH: 34.7 pg — AB (ref 25.1–34.0)
MCHC: 35.1 g/dL (ref 31.5–36.0)
MCV: 98.9 fL (ref 79.5–101.0)
MONO#: 0.5 10*3/uL (ref 0.1–0.9)
MONO%: 18.6 % — ABNORMAL HIGH (ref 0.0–14.0)
NEUT#: 1.5 10*3/uL (ref 1.5–6.5)
NEUT%: 62.7 % (ref 38.4–76.8)
PLATELETS: 73 10*3/uL — AB (ref 145–400)
RBC: 2.75 10*6/uL — ABNORMAL LOW (ref 3.70–5.45)
RDW: 17 % — ABNORMAL HIGH (ref 11.2–14.5)
WBC: 2.5 10*3/uL — ABNORMAL LOW (ref 3.9–10.3)

## 2017-02-25 LAB — MAGNESIUM: Magnesium: 1.2 mg/dl — CL (ref 1.5–2.5)

## 2017-02-25 MED ORDER — SODIUM CHLORIDE 0.9 % IV SOLN
Freq: Once | INTRAVENOUS | Status: AC
  Administered 2017-02-25: 09:00:00 via INTRAVENOUS

## 2017-02-25 MED ORDER — SODIUM CHLORIDE 0.9 % IJ SOLN
10.0000 mL | INTRAMUSCULAR | Status: DC | PRN
  Administered 2017-02-25: 10 mL via INTRAVENOUS
  Filled 2017-02-25: qty 10

## 2017-02-25 MED ORDER — POTASSIUM CHLORIDE 20 MEQ/15ML (10%) PO SOLN: 20.0000 meq | Freq: Two times a day (BID) | ORAL | 0 refills | Status: AC

## 2017-02-25 MED ORDER — SODIUM CHLORIDE 0.9 % IV SOLN
INTRAVENOUS | Status: DC
  Administered 2017-02-25: 10:00:00 via INTRAVENOUS
  Filled 2017-02-25 (×2): qty 1000

## 2017-02-25 MED ORDER — HEPARIN SOD (PORK) LOCK FLUSH 100 UNIT/ML IV SOLN
500.0000 [IU] | Freq: Once | INTRAVENOUS | Status: AC
  Administered 2017-02-25: 500 [IU] via INTRAVENOUS
  Filled 2017-02-25: qty 5

## 2017-02-25 NOTE — Assessment & Plan Note (Signed)
Magnesium level is low today at 1.2. The patient was given 4 g of magnesium sulfate IV here in our office. The patient was instructed to resume her oral magnesium that she has at home as previously directed. Repeat magnesium level next week.

## 2017-02-25 NOTE — Progress Notes (Signed)
[facility] OFFICE PROGRESS NOTE  [name], [place] [address]  DIAGNOSIS: Extensive stage (T2b, N2, M1a) small cell lung cancer presented with large left upper lobe lung mass and mediastinal lymphadenopathy as well as malignant left pleural effusion diagnosed in July 2017.  PRIOR THERAPY: 1) Systemic chemotherapy with cisplatin 60 MG/M2 on day 1 and etoposide at 120 MG/M2 on days 1, 2 and 3 with Neulasta support on day 4. She is status post 6 cycles. 2) prophylactic cranial irradiation under the care of Dr. Sondra Come completed on 05/18/2016. 3) palliative radiotherapy to the enlarging left upper lobe lung nodule under the care of Dr. Sondra Come. 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  CURRENT THERAPY: Systemic chemotherapy with cisplatin 30 MG/M2 and irinotecan 65 MG/M2 on days 1 and 8 every 3 weeks. First dose 10/21/2016.Status post 4 cycles.  INTERVAL HISTORY: Sharon [name] 61 y.o. female returns for a routine follow-up visit accompanied by her friend. Patient recently underwent SRS to new brain lesions last week. She tolerated the procedure well. The patient reports that she had diarrhea related to her chemotherapy over the past 2 weeks. She reports the diarrhea stopped this past Monday. She was not taking Imodium on a regular basis. She had 3-4 loose stools per day. Denies fevers and chills. Denies chest pain, shortness breath, cough, hemoptysis. Denies nausea and vomiting. Her diarrhea has resolved at this point. She reports that she is eating better, but she has lost some more weight. The patient has not been taking her oral magnesium as directed at home. The patient is here for evaluation prior to cycle 5 day 1 of her chemotherapy.  MEDICAL HISTORY: Past Medical History:  Diagnosis Date  . Cancer of right breast ([place]) 2009  . CAP (community acquired pneumonia) 11/06/2015  . Chemotherapy induced neutropenia  ([place]) 12/17/2016  . COPD (chronic obstructive pulmonary disease) ([place])   . Elevated blood pressure   . Encounter for antineoplastic chemotherapy 01/05/2016  . History of radiation therapy 05/04/16-05/18/16   whole brain 25 Gy in 10 fractions  . History of radiation therapy 09/01/16 - 09/21/16   Left Lung treated to 35 Gy in 14 fractions  . Hoarseness of voice    "for the last 2 months" (11/06/2015)  . Hypertension   . Hypokalemia 12/17/2016  . Lung mass dx'd 10/2015  . Menopause   . Personal history of breast cancer 03/29/2016  . Pre-diabetes   . Small cell lung cancer ([place])   . Vitamin D deficiency     ALLERGIES:  is allergic to magnesium-containing compounds.  MEDICATIONS:  Current Outpatient Prescriptions  Medication Sig Dispense Refill  . albuterol (PROVENTIL HFA;VENTOLIN HFA) 108 (90 Base) MCG/ACT inhaler Inhale 1 puff into the lungs every 6 (six) hours as needed for wheezing or shortness of breath.    . budesonide-formoterol (SYMBICORT) 160-4.5 MCG/ACT inhaler Inhale 2 puffs into the lungs 2 (two) times daily. 1 Inhaler 11  . ferrous sulfate 325 (65 FE) MG EC tablet Take 325 mg by mouth daily.    . Ipratropium-Albuterol (COMBIVENT RESPIMAT) 20-100 MCG/ACT AERS respimat Inhale 1 puff into the lungs every 6 (six) hours.    . prochlorperazine (COMPAZINE) 10 MG tablet TAKE 1 TABLET BY MOUTH EVERY 6 HOURS AS NEEDED FOR NAUSEA AND VOMITING 90 tablet 0  . UNABLE TO FIND Take 10 mLs by mouth daily as needed. Med Name: CBD Oil    . fluticasone (FLONASE) 50  MCG/ACT nasal spray Place 1 spray into both nostrils daily.     Marland Kitchen lidocaine-prilocaine (EMLA) cream Apply 1 application topically as needed. (Patient not taking: Reported on 02/25/2017) 30 g 2  . loperamide (IMODIUM) 2 MG capsule Take by mouth as needed for diarrhea or loose stools.    Marland Kitchen LORazepam (ATIVAN) 0.5 MG tablet 1 tablet po 30 minutes prior to radiation or MRI (Patient not taking: Reported on 02/25/2017) 30 tablet 0  . magnesium oxide  (MAG-OX) 400 (241.3 Mg) MG tablet Take 1 tablet (400 mg total) by mouth 4 (four) times daily. (Patient not taking: Reported on 02/25/2017) 120 tablet 0  . meclizine (ANTIVERT) 12.5 MG tablet Take 1-2 tablets (12.5-25 mg total) by mouth 3 (three) times daily as needed for dizziness. (Patient not taking: Reported on 12/29/2016) 20 tablet 0  . montelukast (SINGULAIR) 10 MG tablet Take 10 mg by mouth at bedtime.   2  . oxyCODONE-acetaminophen (PERCOCET/ROXICET) 5-325 MG tablet Take 1 tablet by mouth every 4 (four) hours as needed for severe pain. (Patient not taking: Reported on 02/10/2017) 30 tablet 0  . polyethylene glycol powder (GLYCOLAX/MIRALAX) powder Take 1 Container by mouth daily.    . potassium chloride 20 MEQ/15ML (10%) SOLN Take 15 mLs (20 mEq total) by mouth 2 (two) times daily. 900 mL 0  . SANTYL ointment APP TOPICALLY UTD  0  . senna (SENOKOT) 8.6 MG TABS tablet Take 1 tablet (8.6 mg total) by mouth daily. (Patient not taking: Reported on 02/15/2017) 30 tablet 0  . sucralfate (CARAFATE) 1 GM/10ML suspension Take 10 mLs (1 g total) by mouth 4 (four) times daily -  with meals and at bedtime. (Patient not taking: Reported on 02/25/2017) 420 mL 0   No current facility-administered medications for this visit.    Facility-Administered Medications Ordered in Other Visits  Medication Dose Route Frequency Provider Last Rate Last Dose  . sodium chloride 0.9 % 1,000 mL with potassium chloride 40 mEq, magnesium sulfate 4 g infusion   Intravenous Continuous Maryanna Shape, NP   Stopped at 02/25/17 1412    SURGICAL HISTORY:  Past Surgical History:  Procedure Laterality Date  . BREAST BIOPSY Right 2009  . BREAST LUMPECTOMY Right 2009  . IR FLUORO GUIDE PORT INSERTION RIGHT  02/01/2017  . IR US GUIDE VASC ACCESS RIGHT  02/01/2017  . UTERINE FIBROID SURGERY  2000  . VAGINAL HYSTERECTOMY  2000  . VESICOVAGINAL FISTULA CLOSURE W/ TAH  2000  . VIDEO BRONCHOSCOPY WITH ENDOBRONCHIAL ULTRASOUND N/A  11/10/2015   Procedure: VIDEO BRONCHOSCOPY WITH ENDOBRONCHIAL ULTRASOUND;  Surgeon: Javier Glazier, MD;  Location: [facility];  Service: Thoracic;  Laterality: N/A;    REVIEW OF SYSTEMS:   Review of Systems  Constitutional: Negative for appetite change, chills, fatigue, fever.  HENT:   Negative for mouth sores, nosebleeds, sore throat and trouble swallowing.   Eyes: Negative for eye problems and icterus.  Respiratory: Negative for cough, hemoptysis, shortness of breath and wheezing.   Cardiovascular: Negative for chest pain and leg swelling.  Gastrointestinal: Negative for abdominal pain, constipation, diarrhea, nausea and vomiting.  Genitourinary: Negative for bladder incontinence, difficulty urinating, dysuria, frequency and hematuria.   Musculoskeletal: Negative for back pain, gait problem, neck pain and neck stiffness.  Skin: Negative for itching and rash.  Neurological: Negative for dizziness, extremity weakness, gait problem, headaches, light-headedness and seizures.  Hematological: Negative for adenopathy. Does not bruise/bleed easily.  Psychiatric/Behavioral: Negative for confusion, depression and sleep disturbance. The patient is  not nervous/anxious.     PHYSICAL EXAMINATION:  Blood pressure 112/74, pulse 96, temperature 98.1 F (36.7 C), temperature source Oral, resp. rate 17, height '5\' 6"'  (1.676 m), weight 195 lb 3.2 oz (88.5 kg), SpO2 100 %.  ECOG PERFORMANCE STATUS: 1 - Symptomatic but completely ambulatory  Physical Exam  Constitutional: Oriented to person, place, and time and well-developed, well-nourished, and in no distress. No distress.  HENT:  Head: Normocephalic and atraumatic.  Mouth/Throat: Oropharynx is clear and moist. No oropharyngeal exudate.  Eyes: Conjunctivae are normal. Right eye exhibits no discharge. Left eye exhibits no discharge. No scleral icterus.  Neck: Normal range of motion. Neck supple.  Cardiovascular: Normal rate, regular rhythm, normal heart  sounds and intact distal pulses.   Pulmonary/Chest: Effort normal and breath sounds normal. No respiratory distress. No wheezes. No rales.  Abdominal: Soft. Bowel sounds are normal. Exhibits no distension and no mass. There is no tenderness.  Musculoskeletal: Normal range of motion. Exhibits no edema.  Lymphadenopathy:    No cervical adenopathy.  Neurological: Alert and oriented to person, place, and time. Exhibits normal muscle tone. Gait normal. Coordination normal.  Skin: Skin is warm and dry. No rash noted. Not diaphoretic. No erythema. No pallor.  Psychiatric: Mood, memory and judgment normal.  Vitals reviewed.  LABORATORY DATA: Lab Results  Component Value Date   WBC 2.5 (L) 02/25/2017   HGB 9.6 (L) 02/25/2017   HCT 27.2 (L) 02/25/2017   MCV 98.9 02/25/2017   PLT 73 (L) 02/25/2017      Chemistry      Component Value Date/Time   NA 129 (L) 02/25/2017 0800   K 2.8 (LL) 02/25/2017 0800   CL 98 (L) 01/09/2017 1829   CL 104 07/18/2012 1451   CO2 23 02/25/2017 0800   BUN 12.1 02/25/2017 0800   CREATININE 0.9 02/25/2017 0800      Component Value Date/Time   CALCIUM 9.4 02/25/2017 0800   ALKPHOS 121 02/25/2017 0800   AST 10 02/25/2017 0800   ALT 14 02/25/2017 0800   BILITOT 0.28 02/25/2017 0800     Magnesium level 1.2  RADIOGRAPHIC STUDIES:  Mr Jeri Cos IF Contrast  Result Date: 02/10/2017 CLINICAL DATA:  Breast cancer, continued surveillance. Four month restaging. EXAM: MRI HEAD WITHOUT AND WITH CONTRAST TECHNIQUE: Multiplanar, multiecho pulse sequences of the brain and surrounding structures were obtained without and with intravenous contrast. CONTRAST:  66m MULTIHANCE GADOBENATE DIMEGLUMINE 529 MG/ML IV SOLN COMPARISON:  09/30/2016 MRI. FINDINGS: There is slight motion degradation, on axial 1 mm slices. There may be slight misregistration as well among post infusion images. Brain: Significant improvement in the treated LEFT posterior frontal metastasis, with  corresponding decrease in both size and surrounding edema. Current measurements are 7 x 7 x 7 mm (10/128). Multiple new metastatic lesions are identified. As identified on post contrast axial T1 weighted images series 10, these include - LEFT medial cerebellum, 1 mm, image 29. -LEFT paramedian vermis, 2 mm, image 44. -LEFT hypothalamus, 2 mm, image 73. -Query superficial RIGHT occipital lobe, 2 mm, image 79 (difficult to identify on sagittal and coronal imaging). -medial LEFT occipital lobe, 3 mm, image 80 . - midline splenium corpus callosum, 2 mm, image 81. -Medial RIGHT third ventricle, 2 x 5 mm, image 81. -Superficial RIGHT parietal lobe, 2 mm, image 87. LEFT inferior frontal operculum, 1 mm, should be on image 90, seen only on sagittal and coronal series. -Medial RIGHT frontal lobe/cingulate gyrus, 3 mm, image 116. Vascular: Normal flow  voids. Skull and upper cervical spine: Normal marrow signal. Sinuses/Orbits: Negative. Other: None. IMPRESSION: Improvement in the previously treated LEFT posterior frontal metastasis. At least 9 new enhancing subcentimeter metastatic lesions are documented in the brain. See discussion above. Possible 10th lesion, superficial RIGHT occipital lobe as seen on image 79. Difficult to identify on sagittal/coronal imaging and may be a vessel. Electronically Signed   By: Staci Righter M.D.   On: 02/10/2017 10:39   Ir US Guide Vasc Access Right  Result Date: 02/01/2017 INDICATION: History of metastatic lung cancer. In need of durable intravenous access for chemotherapy administration. EXAM: IMPLANTED PORT A CATH PLACEMENT WITH ULTRASOUND AND FLUOROSCOPIC GUIDANCE COMPARISON:  Chest CT - 01/09/2017 MEDICATIONS: Ancef 2 gm IV; The antibiotic was administered within an appropriate time interval prior to skin puncture. ANESTHESIA/SEDATION: Moderate (conscious) sedation was employed during this procedure. A total of Versed 2 mg and Fentanyl 100 mcg was administered intravenously.  Moderate Sedation Time: 28 minutes. The patient's level of consciousness and vital signs were monitored continuously by radiology nursing throughout the procedure under my direct supervision. CONTRAST:  None FLUOROSCOPY TIME:  48 seconds (12 mGy) COMPLICATIONS: None immediate. PROCEDURE: The procedure, risks, benefits, and alternatives were explained to the patient. Questions regarding the procedure were encouraged and answered. The patient understands and consents to the procedure. The right neck and chest were prepped with chlorhexidine in a sterile fashion, and a sterile drape was applied covering the operative field. Maximum barrier sterile technique with sterile gowns and gloves were used for the procedure. A timeout was performed prior to the initiation of the procedure. Local anesthesia was provided with 1% lidocaine with epinephrine. After creating a small venotomy incision, a micropuncture kit was utilized to access the internal jugular vein. Real-time ultrasound guidance was utilized for vascular access including the acquisition of a permanent ultrasound image documenting patency of the accessed vessel. The microwire was utilized to measure appropriate catheter length. A subcutaneous port pocket was then created along the upper chest wall utilizing a combination of sharp and blunt dissection. The pocket was irrigated with sterile saline. A single lumen ISP power injectable port was chosen for placement. The 8 Fr catheter was tunneled from the port pocket site to the venotomy incision. The port was placed in the pocket. The external catheter was trimmed to appropriate length. At the venotomy, an 8 Fr peel-away sheath was placed over a guidewire under fluoroscopic guidance. The catheter was then placed through the sheath and the sheath was removed. Final catheter positioning was confirmed and documented with a fluoroscopic spot radiograph. The port was accessed with a Huber needle, aspirated and flushed with  heparinized saline. The venotomy site was closed with an interrupted 4-0 Vicryl suture. The port pocket incision was closed with interrupted 2-0 Vicryl suture and the skin was opposed with a running subcuticular 4-0 Vicryl suture. Dermabond and Steri-strips were applied to both incisions. Dressings were placed. The patient tolerated the procedure well without immediate post procedural complication. FINDINGS: After catheter placement, the tip lies within the superior cavoatrial junction. The catheter aspirates and flushes normally and is ready for immediate use. IMPRESSION: Successful placement of a right internal jugular approach power injectable Port-A-Cath. The catheter is ready for immediate use. Electronically Signed   By: Sandi Mariscal M.D.   On: 02/01/2017 16:14   Ir Fluoro Guide Port Insertion Right  Result Date: 02/01/2017 INDICATION: History of metastatic lung cancer. In need of durable intravenous access for chemotherapy administration. EXAM: IMPLANTED PORT  A CATH PLACEMENT WITH ULTRASOUND AND FLUOROSCOPIC GUIDANCE COMPARISON:  Chest CT - 01/09/2017 MEDICATIONS: Ancef 2 gm IV; The antibiotic was administered within an appropriate time interval prior to skin puncture. ANESTHESIA/SEDATION: Moderate (conscious) sedation was employed during this procedure. A total of Versed 2 mg and Fentanyl 100 mcg was administered intravenously. Moderate Sedation Time: 28 minutes. The patient's level of consciousness and vital signs were monitored continuously by radiology nursing throughout the procedure under my direct supervision. CONTRAST:  None FLUOROSCOPY TIME:  48 seconds (12 mGy) COMPLICATIONS: None immediate. PROCEDURE: The procedure, risks, benefits, and alternatives were explained to the patient. Questions regarding the procedure were encouraged and answered. The patient understands and consents to the procedure. The right neck and chest were prepped with chlorhexidine in a sterile fashion, and a sterile drape  was applied covering the operative field. Maximum barrier sterile technique with sterile gowns and gloves were used for the procedure. A timeout was performed prior to the initiation of the procedure. Local anesthesia was provided with 1% lidocaine with epinephrine. After creating a small venotomy incision, a micropuncture kit was utilized to access the internal jugular vein. Real-time ultrasound guidance was utilized for vascular access including the acquisition of a permanent ultrasound image documenting patency of the accessed vessel. The microwire was utilized to measure appropriate catheter length. A subcutaneous port pocket was then created along the upper chest wall utilizing a combination of sharp and blunt dissection. The pocket was irrigated with sterile saline. A single lumen ISP power injectable port was chosen for placement. The 8 Fr catheter was tunneled from the port pocket site to the venotomy incision. The port was placed in the pocket. The external catheter was trimmed to appropriate length. At the venotomy, an 8 Fr peel-away sheath was placed over a guidewire under fluoroscopic guidance. The catheter was then placed through the sheath and the sheath was removed. Final catheter positioning was confirmed and documented with a fluoroscopic spot radiograph. The port was accessed with a Huber needle, aspirated and flushed with heparinized saline. The venotomy site was closed with an interrupted 4-0 Vicryl suture. The port pocket incision was closed with interrupted 2-0 Vicryl suture and the skin was opposed with a running subcuticular 4-0 Vicryl suture. Dermabond and Steri-strips were applied to both incisions. Dressings were placed. The patient tolerated the procedure well without immediate post procedural complication. FINDINGS: After catheter placement, the tip lies within the superior cavoatrial junction. The catheter aspirates and flushes normally and is ready for immediate use. IMPRESSION:  Successful placement of a right internal jugular approach power injectable Port-A-Cath. The catheter is ready for immediate use. Electronically Signed   By: Sandi Mariscal M.D.   On: 02/01/2017 16:14     ASSESSMENT/PLAN:  Primary small cell carcinoma of left lung ([place]) This is a very pleasant 61 year old white female with extensive stage small cell lung cancer status post 6 cycles of systemic chemotherapy with cisplatin and etoposide followed by prophylactic cranial irradiation followed by palliative radiotherapy to a left upper lobe pulmonary nodule followed by stereotactic radiotherapy to solitary brain metastasis.This was followed by disease progression with new bulky left supraclavicular lymphadenopathy as well as new enlarged mediastinal left hilar lymph nodes. The patient was started on systemic chemotherapy with cisplatin 30 MG/M2 and irinotecan 65 MG/M2 on days 1 and 8 every 3 weeks status post 4 cycles. Tolerated her treatment well except for neutropenia, mild nausea, and diarrhea.  Labs reviewed with the patient today. CBC shows a platelet  count of 73,000 and we will hold her chemotherapy today.We will recheck her labs next week and proceed with cycle 5 day 1 of her chemotherapy if her counts recover. We will consider a CT scan of the chest, abdomen, and pelvis prior to cycle 6 of her chemotherapy. Will discuss plan further with Dr. Julien Nordmann next week upon his return.  The patient was advised to call immediately if she has any concerning symptoms in the interval. The patient voices understanding of current disease status and treatment options and is in agreement with the current care plan. All questions were answered. The patient knows to call the clinic with any problems, questions or concerns. We can certainly see the patient much sooner if necessary.  Hypokalemia The patient's potassium level is low today at 2.8. Her diarrhea has resolved. The patient received 40 mEq of potassium IV here in  our office today. I have sent a prescription for liquid potassium 20 mEq twice a day. We will recheck her potassium level next week.  Hypomagnesemia Magnesium level is low today at 1.2. The patient was given 4 g of magnesium sulfate IV here in our office. The patient was instructed to resume her oral magnesium that she has at home as previously directed. Repeat magnesium level next week.  No orders of the defined types were placed in this encounter.   Mikey Bussing, DNP, AGPCNP-BC, AOCNP 02/25/17

## 2017-02-25 NOTE — Assessment & Plan Note (Signed)
The patient's potassium level is low today at 2.8. Her diarrhea has resolved. The patient received 40 mEq of potassium IV here in our office today. I have sent a prescription for liquid potassium 20 mEq twice a day. We will recheck her potassium level next week.

## 2017-02-25 NOTE — Assessment & Plan Note (Signed)
This is a very pleasant 61 year old white female with extensive stage small cell lung cancer status post 6 cycles of systemic chemotherapy with cisplatin and etoposide followed by prophylactic cranial irradiation followed by palliative radiotherapy to a left upper lobe pulmonary nodule followed by stereotactic radiotherapy to solitary brain metastasis.This was followed by disease progression with new bulky left supraclavicular lymphadenopathy as well as new enlarged mediastinal left hilar lymph nodes. The patient was started on systemic chemotherapy with cisplatin 30 MG/M2 and irinotecan 65 MG/M2 on days 1 and 8 every 3 weeks status post 4 cycles. Tolerated her treatment well except for neutropenia, mild nausea, and diarrhea.  Labs reviewed with the patient today. CBC shows a platelet count of 73,000 and we will hold her chemotherapy today.We will recheck her labs next week and proceed with cycle 5 day 1 of her chemotherapy if her counts recover. We will consider a CT scan of the chest, abdomen, and pelvis prior to cycle 6 of her chemotherapy. Will discuss plan further with Dr. Julien Nordmann next week upon his return.  The patient was advised to call immediately if she has any concerning symptoms in the interval. The patient voices understanding of current disease status and treatment options and is in agreement with the current care plan. All questions were answered. The patient knows to call the clinic with any problems, questions or concerns. We can certainly see the patient much sooner if necessary.

## 2017-02-26 ENCOUNTER — Telehealth: Payer: Self-pay

## 2017-02-26 NOTE — Telephone Encounter (Signed)
Spoke with patient and she is aware of her inj appt on 11/10

## 2017-03-03 ENCOUNTER — Ambulatory Visit: Payer: 59 | Admitting: Radiation Oncology

## 2017-03-04 ENCOUNTER — Other Ambulatory Visit: Payer: Self-pay | Admitting: Internal Medicine

## 2017-03-04 ENCOUNTER — Ambulatory Visit (HOSPITAL_BASED_OUTPATIENT_CLINIC_OR_DEPARTMENT_OTHER): Payer: 59

## 2017-03-04 ENCOUNTER — Other Ambulatory Visit (HOSPITAL_BASED_OUTPATIENT_CLINIC_OR_DEPARTMENT_OTHER): Payer: 59

## 2017-03-04 ENCOUNTER — Ambulatory Visit: Payer: 59

## 2017-03-04 ENCOUNTER — Other Ambulatory Visit: Payer: Self-pay | Admitting: *Deleted

## 2017-03-04 ENCOUNTER — Telehealth: Payer: Self-pay | Admitting: Oncology

## 2017-03-04 ENCOUNTER — Ambulatory Visit (HOSPITAL_COMMUNITY)
Admission: RE | Admit: 2017-03-04 | Discharge: 2017-03-04 | Disposition: A | Discharge status: Home / Self Care | Payer: 59 | Source: Ambulatory Visit | Attending: Internal Medicine | Admitting: Internal Medicine

## 2017-03-04 DIAGNOSIS — C3412 Malignant neoplasm of upper lobe, left bronchus or lung: Secondary | ICD-10-CM

## 2017-03-04 DIAGNOSIS — C3492 Malignant neoplasm of unspecified part of left bronchus or lung: Secondary | ICD-10-CM

## 2017-03-04 DIAGNOSIS — D630 Anemia in neoplastic disease: Secondary | ICD-10-CM | POA: Insufficient documentation

## 2017-03-04 LAB — CBC WITH DIFFERENTIAL/PLATELET
BASO%: 0.3 % (ref 0.0–2.0)
BASOS ABS: 0 10*3/uL (ref 0.0–0.1)
EOS ABS: 0 10*3/uL (ref 0.0–0.5)
EOS%: 1.2 % (ref 0.0–7.0)
HCT: 25.6 % — ABNORMAL LOW (ref 34.8–46.6)
HGB: 8.8 g/dL — ABNORMAL LOW (ref 11.6–15.9)
LYMPH%: 16.7 % (ref 14.0–49.7)
MCH: 34.9 pg — AB (ref 25.1–34.0)
MCHC: 34.4 g/dL (ref 31.5–36.0)
MCV: 101.6 fL — AB (ref 79.5–101.0)
MONO#: 0.5 10*3/uL (ref 0.1–0.9)
MONO%: 15.8 % — AB (ref 0.0–14.0)
NEUT#: 2.2 10*3/uL (ref 1.5–6.5)
NEUT%: 66 % (ref 38.4–76.8)
Platelets: 61 10*3/uL — ABNORMAL LOW (ref 145–400)
RBC: 2.52 10*6/uL — AB (ref 3.70–5.45)
RDW: 15.6 % — ABNORMAL HIGH (ref 11.2–14.5)
WBC: 3.3 10*3/uL — AB (ref 3.9–10.3)
lymph#: 0.6 10*3/uL — ABNORMAL LOW (ref 0.9–3.3)

## 2017-03-04 LAB — COMPREHENSIVE METABOLIC PANEL
ALT: 11 U/L (ref 0–55)
AST: 15 U/L (ref 5–34)
Albumin: 2.9 g/dL — ABNORMAL LOW (ref 3.5–5.0)
Alkaline Phosphatase: 106 U/L (ref 40–150)
Anion Gap: 11 mEq/L (ref 3–11)
BUN: 11.9 mg/dL (ref 7.0–26.0)
CALCIUM: 9.7 mg/dL (ref 8.4–10.4)
CHLORIDE: 100 meq/L (ref 98–109)
CO2: 23 meq/L (ref 22–29)
CREATININE: 0.8 mg/dL (ref 0.6–1.1)
EGFR: >60 mL/min/{1.73_m2} (ref >60)
GLUCOSE: 99 mg/dL (ref 70–140)
Potassium: 4.1 mEq/L (ref 3.5–5.1)
Sodium: 134 mEq/L — ABNORMAL LOW (ref 136–145)
Total Bilirubin: 0.29 mg/dL (ref 0.20–1.20)
Total Protein: 6.7 g/dL (ref 6.4–8.3)

## 2017-03-04 LAB — MAGNESIUM: MAGNESIUM: 1.6 mg/dL (ref 1.5–2.5)

## 2017-03-04 LAB — PREPARE RBC (CROSSMATCH)

## 2017-03-04 MED ORDER — ACETAMINOPHEN 325 MG PO TABS
ORAL_TABLET | ORAL | Status: AC
  Filled 2017-03-04: qty 2

## 2017-03-04 MED ORDER — SODIUM CHLORIDE 0.9% FLUSH
10.0000 mL | Freq: Once | INTRAVENOUS | Status: AC
  Administered 2017-03-04: 10 mL
  Filled 2017-03-04: qty 10

## 2017-03-04 MED ORDER — DIPHENHYDRAMINE HCL 25 MG PO CAPS
ORAL_CAPSULE | ORAL | Status: AC
  Filled 2017-03-04: qty 1

## 2017-03-04 MED ORDER — DIPHENHYDRAMINE HCL 25 MG PO CAPS
25.0000 mg | ORAL_CAPSULE | Freq: Once | ORAL | Status: AC
  Administered 2017-03-04: 25 mg via ORAL

## 2017-03-04 MED ORDER — ACETAMINOPHEN 325 MG PO TABS
650.0000 mg | ORAL_TABLET | Freq: Once | ORAL | Status: AC
  Administered 2017-03-04: 650 mg via ORAL

## 2017-03-04 MED ORDER — HEPARIN SOD (PORK) LOCK FLUSH 100 UNIT/ML IV SOLN
500.0000 [IU] | Freq: Once | INTRAVENOUS | Status: AC
  Administered 2017-03-04: 500 [IU]
  Filled 2017-03-04: qty 5

## 2017-03-04 MED ORDER — SODIUM CHLORIDE 0.9 % IV SOLN
250.0000 mL | Freq: Once | INTRAVENOUS | Status: AC
Start: 2017-03-04 — End: 2017-03-04
  Administered 2017-03-04: 250 mL via INTRAVENOUS

## 2017-03-04 NOTE — Patient Instructions (Addendum)

## 2017-03-04 NOTE — Telephone Encounter (Signed)
Per 11/2 - no los at check out

## 2017-03-04 NOTE — Progress Notes (Signed)
Labs reviewed with Dr. Julien Nordmann (platelets 61, WBC 3.3, Hemoglobin 8.8, Sodium 134). Per Dr. Julien Nordmann no treatment today, pt to receive 1 unit PRBCs.

## 2017-03-05 ENCOUNTER — Ambulatory Visit: Payer: 59

## 2017-03-05 LAB — BPAM RBC
Blood Product Expiration Date: 201811212359
ISSUE DATE / TIME: 201811091208
UNIT TYPE AND RH: 6200

## 2017-03-05 LAB — TYPE AND SCREEN
ABO/RH(D): A POS
ANTIBODY SCREEN: NEGATIVE
UNIT DIVISION: 0

## 2017-03-07 ENCOUNTER — Other Ambulatory Visit: Payer: Self-pay | Admitting: *Deleted

## 2017-03-07 DIAGNOSIS — C3492 Malignant neoplasm of unspecified part of left bronchus or lung: Secondary | ICD-10-CM

## 2017-03-09 ENCOUNTER — Ambulatory Visit (HOSPITAL_COMMUNITY)
Admission: RE | Admit: 2017-03-09 | Discharge: 2017-03-09 | Disposition: A | Discharge status: Home / Self Care | Payer: 59 | Source: Ambulatory Visit | Attending: Internal Medicine | Admitting: Internal Medicine

## 2017-03-09 ENCOUNTER — Telehealth: Payer: Self-pay | Admitting: Internal Medicine

## 2017-03-09 ENCOUNTER — Encounter (HOSPITAL_COMMUNITY): Payer: Self-pay

## 2017-03-09 DIAGNOSIS — C787 Secondary malignant neoplasm of liver and intrahepatic bile duct: Secondary | ICD-10-CM | POA: Diagnosis not present

## 2017-03-09 DIAGNOSIS — R590 Localized enlarged lymph nodes: Secondary | ICD-10-CM | POA: Insufficient documentation

## 2017-03-09 DIAGNOSIS — C3492 Malignant neoplasm of unspecified part of left bronchus or lung: Secondary | ICD-10-CM | POA: Insufficient documentation

## 2017-03-09 MED ORDER — HEPARIN SOD (PORK) LOCK FLUSH 100 UNIT/ML IV SOLN
500.0000 [IU] | Freq: Once | INTRAVENOUS | Status: AC
  Administered 2017-03-09: 500 [IU] via INTRAVENOUS

## 2017-03-09 MED ORDER — IOPAMIDOL (ISOVUE-300) INJECTION 61%
100.0000 mL | Freq: Once | INTRAVENOUS | Status: AC | PRN
  Administered 2017-03-09: 100 mL via INTRAVENOUS

## 2017-03-09 MED ORDER — HEPARIN SOD (PORK) LOCK FLUSH 100 UNIT/ML IV SOLN
INTRAVENOUS | Status: AC
  Filled 2017-03-09: qty 5

## 2017-03-09 MED ORDER — IOPAMIDOL (ISOVUE-300) INJECTION 61%
INTRAVENOUS | Status: AC
  Administered 2017-03-09: 100 mL via INTRAVENOUS
  Filled 2017-03-09: qty 100

## 2017-03-09 NOTE — Telephone Encounter (Signed)
Spoke with patient re 11/16 appointments and patient to get updated schedule 11/16.   Left message for desk nurse re clarity on 11/7 and 11/9 schedule messages. If one overrides the other, when can provider see patient due to scan not until 11/14 and lab/chemo 11/16. Marland Kitchen Also per patient she had lab 11/9 - lab not added with 11/14 scan.

## 2017-03-09 NOTE — Telephone Encounter (Signed)
Per response from desk nurse no need to add any further appointments. Patient will get updated schedule at 11/16 visit.

## 2017-03-11 ENCOUNTER — Telehealth: Payer: Self-pay | Admitting: Internal Medicine

## 2017-03-11 ENCOUNTER — Telehealth: Payer: Self-pay | Admitting: Pharmacist

## 2017-03-11 ENCOUNTER — Other Ambulatory Visit (HOSPITAL_BASED_OUTPATIENT_CLINIC_OR_DEPARTMENT_OTHER): Payer: 59

## 2017-03-11 ENCOUNTER — Telehealth: Payer: Self-pay | Admitting: Pharmacy Technician

## 2017-03-11 ENCOUNTER — Encounter: Payer: Self-pay | Admitting: Oncology

## 2017-03-11 ENCOUNTER — Ambulatory Visit (HOSPITAL_BASED_OUTPATIENT_CLINIC_OR_DEPARTMENT_OTHER): Payer: 59 | Admitting: Oncology

## 2017-03-11 ENCOUNTER — Ambulatory Visit: Payer: 59

## 2017-03-11 VITALS — BP 107/70 | HR 79 | Temp 98.9°F | Resp 19 | Ht 66.0 in | Wt 195.9 lb

## 2017-03-11 VITALS — BP 107/70 | HR 79 | Temp 98.9°F | Resp 19

## 2017-03-11 DIAGNOSIS — C787 Secondary malignant neoplasm of liver and intrahepatic bile duct: Secondary | ICD-10-CM | POA: Diagnosis not present

## 2017-03-11 DIAGNOSIS — C3492 Malignant neoplasm of unspecified part of left bronchus or lung: Secondary | ICD-10-CM

## 2017-03-11 DIAGNOSIS — C7931 Secondary malignant neoplasm of brain: Secondary | ICD-10-CM | POA: Diagnosis not present

## 2017-03-11 DIAGNOSIS — J910 Malignant pleural effusion: Secondary | ICD-10-CM | POA: Diagnosis not present

## 2017-03-11 DIAGNOSIS — Z7189 Other specified counseling: Secondary | ICD-10-CM

## 2017-03-11 DIAGNOSIS — Z95828 Presence of other vascular implants and grafts: Secondary | ICD-10-CM

## 2017-03-11 DIAGNOSIS — C3412 Malignant neoplasm of upper lobe, left bronchus or lung: Secondary | ICD-10-CM

## 2017-03-11 DIAGNOSIS — Z5111 Encounter for antineoplastic chemotherapy: Secondary | ICD-10-CM

## 2017-03-11 DIAGNOSIS — D496 Neoplasm of unspecified behavior of brain: Secondary | ICD-10-CM

## 2017-03-11 LAB — CBC WITH DIFFERENTIAL/PLATELET
BASO%: 0.5 % (ref 0.0–2.0)
Basophils Absolute: 0 10*3/uL (ref 0.0–0.1)
EOS ABS: 0 10*3/uL (ref 0.0–0.5)
EOS%: 1 % (ref 0.0–7.0)
HEMATOCRIT: 30.2 % — AB (ref 34.8–46.6)
HEMOGLOBIN: 10.4 g/dL — AB (ref 11.6–15.9)
LYMPH#: 0.5 10*3/uL — AB (ref 0.9–3.3)
LYMPH%: 12.6 % — AB (ref 14.0–49.7)
MCH: 34.6 pg — ABNORMAL HIGH (ref 25.1–34.0)
MCHC: 34.4 g/dL (ref 31.5–36.0)
MCV: 100.5 fL (ref 79.5–101.0)
MONO#: 0.6 10*3/uL (ref 0.1–0.9)
MONO%: 16.1 % — ABNORMAL HIGH (ref 0.0–14.0)
NEUT%: 69.8 % (ref 38.4–76.8)
NEUTROS ABS: 2.7 10*3/uL (ref 1.5–6.5)
PLATELETS: 89 10*3/uL — AB (ref 145–400)
RBC: 3 10*6/uL — ABNORMAL LOW (ref 3.70–5.45)
RDW: 17.3 % — AB (ref 11.2–14.5)
WBC: 3.9 10*3/uL (ref 3.9–10.3)

## 2017-03-11 LAB — COMPREHENSIVE METABOLIC PANEL
ALT: 19 U/L (ref 0–55)
ANION GAP: 12 meq/L — AB (ref 3–11)
AST: 29 U/L (ref 5–34)
Albumin: 3.2 g/dL — ABNORMAL LOW (ref 3.5–5.0)
Alkaline Phosphatase: 131 U/L (ref 40–150)
BUN: 10.7 mg/dL (ref 7.0–26.0)
CALCIUM: 10 mg/dL (ref 8.4–10.4)
CHLORIDE: 100 meq/L (ref 98–109)
CO2: 24 mEq/L (ref 22–29)
Creatinine: 0.8 mg/dL (ref 0.6–1.1)
EGFR: >60 mL/min/{1.73_m2} (ref >60)
Glucose: 99 mg/dl (ref 70–140)
POTASSIUM: 3.7 meq/L (ref 3.5–5.1)
Sodium: 135 mEq/L — ABNORMAL LOW (ref 136–145)
TOTAL PROTEIN: 7.3 g/dL (ref 6.4–8.3)
Total Bilirubin: 0.3 mg/dL (ref 0.20–1.20)

## 2017-03-11 LAB — MAGNESIUM: MAGNESIUM: 1.5 mg/dL (ref 1.5–2.5)

## 2017-03-11 MED ORDER — HEPARIN SOD (PORK) LOCK FLUSH 100 UNIT/ML IV SOLN
500.0000 [IU] | Freq: Once | INTRAVENOUS | Status: AC
  Administered 2017-03-11: 500 [IU] via INTRAVENOUS
  Filled 2017-03-11: qty 5

## 2017-03-11 MED ORDER — TEMOZOLOMIDE 140 MG PO CAPS: 150.0000 mg/m2/d | ORAL_CAPSULE | Freq: Every day | ORAL | 2 refills | Status: DC

## 2017-03-11 MED ORDER — SODIUM CHLORIDE 0.9% FLUSH
10.0000 mL | INTRAVENOUS | Status: DC | PRN
  Administered 2017-03-11 (×2): 10 mL via INTRAVENOUS
  Filled 2017-03-11: qty 10

## 2017-03-11 MED ORDER — ONDANSETRON HCL 8 MG PO TABS: 8.0000 mg | ORAL_TABLET | Freq: Three times a day (TID) | ORAL | 1 refills | Status: AC | PRN

## 2017-03-11 NOTE — Telephone Encounter (Signed)
Oral Oncology Patient Advocate Encounter  Received notification from [facility] that prior authorization for Temodar is required.  PA submitted on CoverMyMeds Key [id number] Status is pending  Oral Oncology Clinic will continue to follow.  Fabio Asa. Melynda Keller, [place] Patient [place] [phone] 03/11/2017 11:16 AM

## 2017-03-11 NOTE — Progress Notes (Signed)
[facility] OFFICE PROGRESS NOTE  Sharon [name], [place] [address]  DIAGNOSIS: Extensive stage (T2b, N2, M1a) small cell lung cancer presented with large left upper lobe lung mass and mediastinal lymphadenopathy as well as malignant left pleural effusion diagnosed in July 2017.  PRIOR THERAPY: 1) Systemic chemotherapy with cisplatin 60 MG/M2 on day 1 and etoposide at 120 MG/M2 on days 1, 2 and 3 with Neulasta support on day 4. She is status post 6 cycles. 2) prophylactic cranial irradiation under the care of Dr. Sondra Come completed on 05/18/2016. 3) palliative radiotherapy to the enlarging left upper lobe lung nodule under the care of Dr. Sondra Come. 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 5) Systemic chemotherapy with cisplatin 30 MG/M2 and irinotecan 65 MG/M2 on days 1 and 8 every 3 weeks. First dose 10/21/2016.Status post 4 cycles.  CURRENT THERAPY: Temodar 280 mg daily at bedtime X 5 days. Patient to begin treatment in the next few days.   INTERVAL HISTORY: Sharon [name] 61 y.o. female returns for a routine follow up visit with her son. Chemotherapy has been held the past 2 weeks due to thrombocytopenia.  She received 1 unit of packed red blood cells last week for symptomatic anemia.  The patient felt better after the blood transfusion.  The patient denies fevers and chills.  Denies chest pain, shortness of breath, cough, hemoptysis.  She has intermittent nausea and vomited one time since her last visit.  She had one episode of diarrhea since her last visit.  She is eating well and her weight has remained stable.  The patient had a recent restaging CT scan of the chest, abdomen, pelvis and is here to discuss the results.  MEDICAL HISTORY: Past Medical History:  Diagnosis Date  . Cancer of right breast ([place]) 2009  . CAP (community acquired pneumonia) 11/06/2015  . Chemotherapy induced neutropenia ([place]) 12/17/2016  .  COPD (chronic obstructive pulmonary disease) ([place])   . Elevated blood pressure   . Encounter for antineoplastic chemotherapy 01/05/2016  . History of radiation therapy 05/04/16-05/18/16   whole brain 25 Gy in 10 fractions  . History of radiation therapy 09/01/16 - 09/21/16   Left Lung treated to 35 Gy in 14 fractions  . Hoarseness of voice    "for the last 2 months" (11/06/2015)  . Hypertension   . Hypokalemia 12/17/2016  . Lung mass dx'd 10/2015  . Menopause   . Personal history of breast cancer 03/29/2016  . Pre-diabetes   . Small cell lung cancer ([place])   . Vitamin D deficiency     ALLERGIES:  is allergic to magnesium-containing compounds.  MEDICATIONS:  Current Outpatient Medications  Medication Sig Dispense Refill  . albuterol (PROVENTIL HFA;VENTOLIN HFA) 108 (90 Base) MCG/ACT inhaler Inhale 1 puff into the lungs every 6 (six) hours as needed for wheezing or shortness of breath.    . budesonide-formoterol (SYMBICORT) 160-4.5 MCG/ACT inhaler Inhale 2 puffs into the lungs 2 (two) times daily. 1 Inhaler 11  . ferrous sulfate 325 (65 FE) MG EC tablet Take 325 mg by mouth daily.    . fluticasone (FLONASE) 50 MCG/ACT nasal spray Place 1 spray into both nostrils daily.     . Ipratropium-Albuterol (COMBIVENT RESPIMAT) 20-100 MCG/ACT AERS respimat Inhale 1 puff into the lungs every 6 (six) hours.    . lidocaine-prilocaine (EMLA) cream Apply 1 application topically as needed. (Patient not taking: Reported on 02/25/2017) 30 g 2  .  loperamide (IMODIUM) 2 MG capsule Take by mouth as needed for diarrhea or loose stools.    Marland Kitchen LORazepam (ATIVAN) 0.5 MG tablet 1 tablet po 30 minutes prior to radiation or MRI (Patient not taking: Reported on 02/25/2017) 30 tablet 0  . magnesium oxide (MAG-OX) 400 (241.3 Mg) MG tablet Take 1 tablet (400 mg total) by mouth 4 (four) times daily. (Patient not taking: Reported on 02/25/2017) 120 tablet 0  . meclizine (ANTIVERT) 12.5 MG tablet Take 1-2 tablets (12.5-25 mg total) by  mouth 3 (three) times daily as needed for dizziness. (Patient not taking: Reported on 12/29/2016) 20 tablet 0  . montelukast (SINGULAIR) 10 MG tablet Take 10 mg by mouth at bedtime.   2  . ondansetron (ZOFRAN) 8 MG tablet Take 1 tablet (8 mg total) every 8 (eight) hours as needed by mouth for nausea or vomiting. 20 tablet 1  . oxyCODONE-acetaminophen (PERCOCET/ROXICET) 5-325 MG tablet Take 1 tablet by mouth every 4 (four) hours as needed for severe pain. (Patient not taking: Reported on 02/10/2017) 30 tablet 0  . polyethylene glycol powder (GLYCOLAX/MIRALAX) powder Take 1 Container by mouth daily.    . potassium chloride 20 MEQ/15ML (10%) SOLN Take 15 mLs (20 mEq total) by mouth 2 (two) times daily. 900 mL 0  . prochlorperazine (COMPAZINE) 10 MG tablet TAKE 1 TABLET BY MOUTH EVERY 6 HOURS AS NEEDED FOR NAUSEA AND VOMITING 90 tablet 0  . SANTYL ointment APP TOPICALLY UTD  0  . senna (SENOKOT) 8.6 MG TABS tablet Take 1 tablet (8.6 mg total) by mouth daily. (Patient not taking: Reported on 02/15/2017) 30 tablet 0  . sucralfate (CARAFATE) 1 GM/10ML suspension Take 10 mLs (1 g total) by mouth 4 (four) times daily -  with meals and at bedtime. (Patient not taking: Reported on 02/25/2017) 420 mL 0  . temozolomide (TEMODAR) 140 MG capsule Take 2 capsules (280 mg total) daily by mouth. May take on an empty stomach or at bedtime to decrease nausea & vomiting. 10 capsule 2  . UNABLE TO FIND Take 10 mLs by mouth daily as needed. Med Name: CBD Oil     No current facility-administered medications for this visit.    Facility-Administered Medications Ordered in Other Visits  Medication Dose Route Frequency Provider Last Rate Last Dose  . sodium chloride flush (NS) 0.9 % injection 10 mL  10 mL Intravenous PRN Curt Bears, MD   10 mL at 03/11/17 0957    SURGICAL HISTORY:  Past Surgical History:  Procedure Laterality Date  . BREAST BIOPSY Right 2009  . BREAST LUMPECTOMY Right 2009  . IR FLUORO GUIDE PORT  INSERTION RIGHT  02/01/2017  . IR US GUIDE VASC ACCESS RIGHT  02/01/2017  . UTERINE FIBROID SURGERY  2000  . VAGINAL HYSTERECTOMY  2000  . VESICOVAGINAL FISTULA CLOSURE W/ TAH  2000  . VIDEO BRONCHOSCOPY WITH ENDOBRONCHIAL ULTRASOUND N/A 11/10/2015   Performed by Javier Glazier, MD at [place]:   Review of Systems  Constitutional: Negative for appetite change, chills, fatigue, fever and unexpected weight change.  HENT:   Negative for mouth sores, nosebleeds, sore throat and trouble swallowing.   Eyes: Negative for eye problems and icterus.  Respiratory: Negative for cough, hemoptysis, shortness of breath and wheezing.   Cardiovascular: Negative for chest pain and leg swelling.  Gastrointestinal: Negative for abdominal pain, and constipation. The patient has intermittent nausea.  She has had one episode of vomiting and one episode  of diarrhea since her last visit 2 weeks ago. Genitourinary: Negative for bladder incontinence, difficulty urinating, dysuria, frequency and hematuria.   Musculoskeletal: Negative for back pain, gait problem, neck pain and neck stiffness.  Skin: Negative for itching and rash.  Neurological: Negative for dizziness, extremity weakness, gait problem, headaches, light-headedness and seizures.  Hematological: Negative for adenopathy. Does not bruise/bleed easily.  Psychiatric/Behavioral: Negative for confusion, depression and sleep disturbance. The patient is not nervous/anxious.     PHYSICAL EXAMINATION:  Blood pressure 107/70, pulse 79, temperature 98.9 F (37.2 C), temperature source Oral, resp. rate 19, height 5\' 6"  (1.676 m), weight 195 lb 14.4 oz (88.9 kg), SpO2 97 %.  ECOG PERFORMANCE STATUS: 1 - Symptomatic but completely ambulatory  Physical Exam  Constitutional: Oriented to person, place, and time and well-developed, well-nourished, and in no distress. No distress.  HENT:  Head: Normocephalic and atraumatic.  Mouth/Throat:  Oropharynx is clear and moist. No oropharyngeal exudate.  Eyes: Conjunctivae are normal. Right eye exhibits no discharge. Left eye exhibits no discharge. No scleral icterus.  Neck: Normal range of motion. Neck supple.  Cardiovascular: Normal rate, regular rhythm, normal heart sounds and intact distal pulses.   Pulmonary/Chest: Effort normal and breath sounds normal. No respiratory distress. No wheezes. No rales.  Abdominal: Soft. Bowel sounds are normal. Exhibits no distension and no mass. There is no tenderness.  Musculoskeletal: Normal range of motion. Exhibits no edema.  Lymphadenopathy:    No cervical adenopathy.  Neurological: Alert and oriented to person, place, and time. Exhibits normal muscle tone. Gait normal. Coordination normal.  Skin: Skin is warm and dry. No rash noted. Not diaphoretic. No erythema. No pallor.  Psychiatric: Mood, memory and judgment normal.  Vitals reviewed.  LABORATORY DATA: Lab Results  Component Value Date   WBC 3.9 03/11/2017   HGB 10.4 (L) 03/11/2017   HCT 30.2 (L) 03/11/2017   MCV 100.5 03/11/2017   PLT 89 (L) 03/11/2017      Chemistry      Component Value Date/Time   NA 135 (L) 03/11/2017 0818   K 3.7 03/11/2017 0818   CL 98 (L) 01/09/2017 1829   CL 104 07/18/2012 1451   CO2 24 03/11/2017 0818   BUN 10.7 03/11/2017 0818   CREATININE 0.8 03/11/2017 0818      Component Value Date/Time   CALCIUM 10.0 03/11/2017 0818   ALKPHOS 131 03/11/2017 0818   AST 29 03/11/2017 0818   ALT 19 03/11/2017 0818   BILITOT 0.30 03/11/2017 0818     Magnesium level is normal at 1.5  RADIOGRAPHIC STUDIES:  Ct Chest W Contrast  Result Date: 03/09/2017 CLINICAL DATA:  Small cell lung cancer follow-up. EXAM: CT CHEST AND ABDOMEN WITH CONTRAST TECHNIQUE: Multidetector CT imaging of the chest and abdomen was performed following the standard protocol during bolus administration of intravenous contrast. CONTRAST:  100 cc of Isovue-300 COMPARISON:  12/24/2016.  FINDINGS: CT CHEST FINDINGS Cardiovascular: The heart size appears normal. No pericardial effusion identified. Mediastinum/Nodes: The trachea appears patent. Normal appearance of the esophagus. Index left supraclavicular lymph node Measures 1.1 cm, image 7 of series 2. Previously 1.9 cm. 1.2 cm right paratracheal lymph node identified, image 16 of series 2. Previously 1 cm. The large left-sided AP window lymph node measuring 2.4 cm on the previous exam has resolved in the interval. Sub- carinal lymph node measures 1.1 cm, image 31 of series 2. Previously 0.8 cm. Lungs/Pleura: No pleural effusion. Linear masslike subpleural architectural distortion and fibrosis within the left  upper lobe appears increased from previous exam, image number 52 of series 6. The index nodule within the left upper lobe measures 1.5 cm, image 51 of series 6. Previously 1.3 cm. Moderate changes of centrilobular emphysema. Musculoskeletal: No chest wall mass or suspicious bone lesions identified. CT ABDOMEN FINDINGS Hepatobiliary: Liver hemangioma along the posterior dome is again noted measuring approximately 2.2 cm. New subcentimeter intermediate to low attenuation foci are identified throughout both lobes of liver. These are too numerous to count, suspicious for liver metastasis. Several larger peripheral enhancing lesions are also new from previous exam and worrisome for metastatic disease. For example within segment 5 there is a 1.4 cm target lesion, image 75 of series 2. Pancreas: Unremarkable. No pancreatic ductal dilatation or surrounding inflammatory changes. Spleen: Normal in size without focal abnormality. Adrenals/Urinary Tract: Normal appearance of the left adrenal gland. 1.2 cm right adrenal nodule is unchanged, image 59 of series 2. 5 mm low density structure within the lower pole of right kidney is too small to characterize. Stomach/Bowel: Stomach is within normal limits. Appendix appears normal. No evidence of bowel wall  thickening, distention, or inflammatory changes. Vascular/Lymphatic: Aortic atherosclerosis.  No aneurysm. Other: No abdominal wall hernia or abnormality. No abdominopelvic ascites. Musculoskeletal: Scattered small sclerotic foci are identified within the lower lumbar spine and pelvis which are suspicious for metastatic disease. IMPRESSION: 1. There has been an interval mixed response to therapy. 2. Decrease in size of large left supraclavicular and left AP window lymph nodes 3. New multifocal liver metastasis. 4. Increase in size of sub- carinal and right paratracheal lymph nodes. Index lesion within the left upper lobe is slightly increased in size in the interval. 5. Subtle foci of sclerosis within the lumbar spine and pelvis worrisome for metastatic disease. Findings may reflect treatment changes of previous occult bone metastases. Electronically Signed   By: Kerby Moors M.D.   On: 03/09/2017 15:21   Ct Abdomen W Contrast  Result Date: 03/09/2017 CLINICAL DATA:  Small cell lung cancer follow-up. EXAM: CT CHEST AND ABDOMEN WITH CONTRAST TECHNIQUE: Multidetector CT imaging of the chest and abdomen was performed following the standard protocol during bolus administration of intravenous contrast. CONTRAST:  100 cc of Isovue-300 COMPARISON:  12/24/2016. FINDINGS: CT CHEST FINDINGS Cardiovascular: The heart size appears normal. No pericardial effusion identified. Mediastinum/Nodes: The trachea appears patent. Normal appearance of the esophagus. Index left supraclavicular lymph node Measures 1.1 cm, image 7 of series 2. Previously 1.9 cm. 1.2 cm right paratracheal lymph node identified, image 16 of series 2. Previously 1 cm. The large left-sided AP window lymph node measuring 2.4 cm on the previous exam has resolved in the interval. Sub- carinal lymph node measures 1.1 cm, image 31 of series 2. Previously 0.8 cm. Lungs/Pleura: No pleural effusion. Linear masslike subpleural architectural distortion and fibrosis  within the left upper lobe appears increased from previous exam, image number 52 of series 6. The index nodule within the left upper lobe measures 1.5 cm, image 51 of series 6. Previously 1.3 cm. Moderate changes of centrilobular emphysema. Musculoskeletal: No chest wall mass or suspicious bone lesions identified. CT ABDOMEN FINDINGS Hepatobiliary: Liver hemangioma along the posterior dome is again noted measuring approximately 2.2 cm. New subcentimeter intermediate to low attenuation foci are identified throughout both lobes of liver. These are too numerous to count, suspicious for liver metastasis. Several larger peripheral enhancing lesions are also new from previous exam and worrisome for metastatic disease. For example within segment 5 there is a 1.4  cm target lesion, image 75 of series 2. Pancreas: Unremarkable. No pancreatic ductal dilatation or surrounding inflammatory changes. Spleen: Normal in size without focal abnormality. Adrenals/Urinary Tract: Normal appearance of the left adrenal gland. 1.2 cm right adrenal nodule is unchanged, image 59 of series 2. 5 mm low density structure within the lower pole of right kidney is too small to characterize. Stomach/Bowel: Stomach is within normal limits. Appendix appears normal. No evidence of bowel wall thickening, distention, or inflammatory changes. Vascular/Lymphatic: Aortic atherosclerosis.  No aneurysm. Other: No abdominal wall hernia or abnormality. No abdominopelvic ascites. Musculoskeletal: Scattered small sclerotic foci are identified within the lower lumbar spine and pelvis which are suspicious for metastatic disease. IMPRESSION: 1. There has been an interval mixed response to therapy. 2. Decrease in size of large left supraclavicular and left AP window lymph nodes 3. New multifocal liver metastasis. 4. Increase in size of sub- carinal and right paratracheal lymph nodes. Index lesion within the left upper lobe is slightly increased in size in the  interval. 5. Subtle foci of sclerosis within the lumbar spine and pelvis worrisome for metastatic disease. Findings may reflect treatment changes of previous occult bone metastases. Electronically Signed   By: Kerby Moors M.D.   On: 03/09/2017 15:21   Mr Sharon [name] Contrast  Result Date: 02/10/2017 CLINICAL DATA:  Breast cancer, continued surveillance. Four month restaging. EXAM: MRI HEAD WITHOUT AND WITH CONTRAST TECHNIQUE: Multiplanar, multiecho pulse sequences of the brain and surrounding structures were obtained without and with intravenous contrast. CONTRAST:  54mL MULTIHANCE GADOBENATE DIMEGLUMINE 529 MG/ML IV SOLN COMPARISON:  09/30/2016 MRI. FINDINGS: There is slight motion degradation, on axial 1 mm slices. There may be slight misregistration as well among post infusion images. Brain: Significant improvement in the treated LEFT posterior frontal metastasis, with corresponding decrease in both size and surrounding edema. Current measurements are 7 x 7 x 7 mm (10/128). Multiple new metastatic lesions are identified. As identified on post contrast axial T1 weighted images series 10, these include - LEFT medial cerebellum, 1 mm, image 29. -LEFT paramedian vermis, 2 mm, image 44. -LEFT hypothalamus, 2 mm, image 73. -Query superficial RIGHT occipital lobe, 2 mm, image 79 (difficult to identify on sagittal and coronal imaging). -medial LEFT occipital lobe, 3 mm, image 80 . - midline splenium corpus callosum, 2 mm, image 81. -Medial RIGHT third ventricle, 2 x 5 mm, image 81. -Superficial RIGHT parietal lobe, 2 mm, image 87. LEFT inferior frontal operculum, 1 mm, should be on image 90, seen only on sagittal and coronal series. -Medial RIGHT frontal lobe/cingulate gyrus, 3 mm, image 116. Vascular: Normal flow voids. Skull and upper cervical spine: Normal marrow signal. Sinuses/Orbits: Negative. Other: None. IMPRESSION: Improvement in the previously treated LEFT posterior frontal metastasis. At least 9 new  enhancing subcentimeter metastatic lesions are documented in the brain. See discussion above. Possible 10th lesion, superficial RIGHT occipital lobe as seen on image 79. Difficult to identify on sagittal/coronal imaging and may be a vessel. Electronically Signed   By: Staci Righter M.D.   On: 02/10/2017 10:39     ASSESSMENT/PLAN:   This is a very pleasant 61year old white female with extensive stage small cell lung cancer status post 6 cycles of systemic chemotherapy with cisplatin and etoposide followed by prophylactic cranial irradiation followed by palliative radiotherapy to a left upper lobe pulmonary nodule followed by stereotactic radiotherapy to solitary brain metastasis.This was followed by disease progression with new bulky left supraclavicular lymphadenopathy as well as new enlarged  mediastinal left hilar lymph nodes. The patient was started on systemic chemotherapy with cisplatin 30 MG/M2 and irinotecan 65 MG/M2 on days 1 and 8 every 3 weeks status post 4 cycles. Tolerated her treatment well except for neutropenia, thrombocytopenia, intermittent nausea and vomiting, and diarrhea.  The patient had a recent restaging CT scan of the chest, abdomen, pelvis performed and is here to discuss the results.  The patient was seen with Dr. Julien Nordmann.  CT images were reviewed with the patient and her son.  The patient has had some decrease in lymph nodes located in the lung, but she does have some new liver lesions.  Recommend that she discontinue treatment with cisplatin and Irinotecan at this time. Treatment options were discussed with the patient and her son including Nivolumab 240 mg IV every 2 weeks versus Temodar given 5 days in a row every 4 weeks versus observation.  The patient is interested in pursuing treatment.  We discussed that she does have ongoing brain lesions which are being followed by radiation oncology.  We discussed that Temodar can cross the blood-brain barrier and may help with her  brain lesions in addition to the disease elsewhere.  Adverse effects of Temodar were discussed with the patient and her son including, but not limited to, myelosuppression, nausea and vomiting, and diarrhea.  The patient would like to proceed with this medication.  We will work with the oral chemotherapy pharmacist to determine where to send her prescription.  The patient was instructed to begin her Temodar when she receives it.  She was instructed to take it at bedtime.  She was also instructed to take Zofran 30 minutes prior to her Temodar dose.  A prescription for Zofran was sent to her local pharmacy.  The patient will return in 2 weeks for evaluation and lab work.  The patient was advised to call immediately if she has any concerning symptoms in the interval. The patient voices understanding of current disease status and treatment options and is in agreement with the current care plan. All questions were answered. The patient knows to call the clinic with any problems, questions or concerns. We can certainly see the patient much sooner if necessary.  Orders Placed This Encounter  Procedures  . CBC with Differential/Platelet    Standing Status:   Future    Standing Expiration Date:   03/11/2018  . Comprehensive metabolic panel    Standing Status:   Future    Standing Expiration Date:   03/11/2018    Mikey Bussing, DNP, AGPCNP-BC, AOCNP 03/11/17  ADDENDUM: Hematology/Oncology Attending: I had a face-to-face encounter with the patient.  I recommended her care plan.  This is a very pleasant 62 years old white female with extensive stage small cell lung cancer status post several chemotherapy regimens as well as palliative radiation.  She was recently treated with systemic chemotherapy with cisplatin and irinotecan discontinued secondary to intolerance as well as disease progression. She had a repeat CT scan of the chest, abdomen and pelvis performed recently.  I personally and  independently reviewed the scan images and discussed the results and showed the images to the patient and her son.  Unfortunately had a scan showed evidence for disease progression in the liver but there is decrease in the size of the supraclavicular and mediastinal lymphadenopathy. I discussed with the patient several options for management of her condition including palliative care and hospice referral versus consideration of treatment with immunotherapy with Nivolumab versus treatment with Temodar because of  her progressive disease in the brain.  After discussion of all the options and the benefit and risk of each 1 of these options, the patient is interested in treatment with Temodar which she has brain penetration properties and it may reduce the risk of further disease progression in the brain. I will start the patient on Temodar 150 mg/M2 for 5 days every 4 weeks.  I advised the patient to take Zofran 8 mg p.o. half an hour before her dose of Temodar. We will continue to monitor her blood count closely and the patient will come back for follow-up visit in 2 weeks for reevaluation and management of any adverse effect of her treatment.  If the patient fails this treatment, then we will consider her for either treatment with Nivolumab or hospice and palliative care. She was advised to call immediately if she has any concerning symptoms in the interval.  Disclaimer: This note was dictated with voice recognition software. Similar sounding words can inadvertently be transcribed and may be missed upon review. [name], MD 03/12/17

## 2017-03-11 NOTE — Patient Instructions (Signed)
Implanted Port Home Guide An implanted port is a type of central line that is placed under the skin. Central lines are used to provide IV access when treatment or nutrition needs to be given through a person's veins. Implanted ports are used for long-term IV access. An implanted port may be placed because:  You need IV medicine that would be irritating to the small veins in your hands or arms.  You need long-term IV medicines, such as antibiotics.  You need IV nutrition for a long period.  You need frequent blood draws for lab tests.  You need dialysis.  Implanted ports are usually placed in the chest area, but they can also be placed in the upper arm, the abdomen, or the leg. An implanted port has two main parts:  Reservoir. The reservoir is round and will appear as a small, raised area under your skin. The reservoir is the part where a needle is inserted to give medicines or draw blood.  Catheter. The catheter is a thin, flexible tube that extends from the reservoir. The catheter is placed into a large vein. Medicine that is inserted into the reservoir goes into the catheter and then into the vein.  How will I care for my incision site? Do not get the incision site wet. Bathe or shower as directed by your health care provider. How is my port accessed? Special steps must be taken to access the port:  Before the port is accessed, a numbing cream can be placed on the skin. This helps numb the skin over the port site.  Your health care provider uses a sterile technique to access the port. ? Your health care provider must put on a mask and sterile gloves. ? The skin over your port is cleaned carefully with an antiseptic and allowed to dry. ? The port is gently pinched between sterile gloves, and a needle is inserted into the port.  Only "non-coring" port needles should be used to access the port. Once the port is accessed, a blood return should be checked. This helps ensure that the port  is in the vein and is not clogged.  If your port needs to remain accessed for a constant infusion, a clear (transparent) bandage will be placed over the needle site. The bandage and needle will need to be changed every week, or as directed by your health care provider.  Keep the bandage covering the needle clean and dry. Do not get it wet. Follow your health care provider's instructions on how to take a shower or bath while the port is accessed.  If your port does not need to stay accessed, no bandage is needed over the port.  What is flushing? Flushing helps keep the port from getting clogged. Follow your health care provider's instructions on how and when to flush the port. Ports are usually flushed with saline solution or a medicine called heparin. The need for flushing will depend on how the port is used.  If the port is used for intermittent medicines or blood draws, the port will need to be flushed: ? After medicines have been given. ? After blood has been drawn. ? As part of routine maintenance.  If a constant infusion is running, the port may not need to be flushed.  How long will my port stay implanted? The port can stay in for as long as your health care provider thinks it is needed. When it is time for the port to come out, surgery will be   done to remove it. The procedure is similar to the one performed when the port was put in. When should I seek immediate medical care? When you have an implanted port, you should seek immediate medical care if:  You notice a bad smell coming from the incision site.  You have swelling, redness, or drainage at the incision site.  You have more swelling or pain at the port site or the surrounding area.  You have a fever that is not controlled with medicine.  This information is not intended to replace advice given to you by your health care provider. Make sure you discuss any questions you have with your health care provider. Document  Released: 04/12/2005 Document Revised: 09/18/2015 Document Reviewed: 12/18/2012 Elsevier Interactive Patient Education  2017 Elsevier Inc.  

## 2017-03-11 NOTE — Telephone Encounter (Signed)
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  Labs from 03/11/17 assessed, OK for treatment. Noted thrombocytopenia (pltc=89k), caution with temozolomide initiation with pltc <100K, patient will be closely monitored.  Current medication list in Epic reviewed, no DDIs with temozolomide identified.  Prescription has been e-scribed to the [facility] for benefits analysis and approval per insurance requirement.   I spoke with patient on 03/11/17 for overview of: Temodar.  Pt is doing well. Counseled patient on administration, dosing, side effects, monitoring, drug-food interactions, safe handling, storage, and disposal.  Patient will take Temodar 140mg  capsules, 2 capsules (280mg ) by mouth once daily, may take at bedtime and on an empty stomach to decrease nausea and vomiting. Patient will take Temodar on days 1-5 of every 28 day cycle. Temodar and radiation start date: pending medication acquisition  Patient will take Zofran 8mg  tablet, 1 tablet by mouth 30-60 min prior to Temodar dose to help decrease N/V.  Side effects include but not limited to: nausea, vomiting, anorexia, GI upset, rash, drug fever, and fatigue.  Reviewed with patient importance of keeping a medication schedule and plan for any missed doses.  Ms. Siska voiced understanding and appreciation.   All questions answered. Medication reconciliation performed and medication/allergy list updated.  Patient updated regarding insurance and pharmacy.   Patient knows to call the office with questions or concerns. Oral Oncology Clinic will continue to follow.  Thank you,  Johny Drilling, PharmD, BCPS, BCOP 03/14/2017  4:53 PM Oral Oncology Clinic [phone]

## 2017-03-11 NOTE — Progress Notes (Signed)
  Radiation Oncology         [phone] ________________________________  Name: Sharon [name] MRN: [id number]  Date: 02/22/2017  DOB: 10/24/1955  End of Treatment Note  Diagnosis:   61 y.o. 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  Narrative: The patient tolerated radiation treatment well.   There were no signs of acute toxicity after treatment.  Plan: The patient has completed radiation treatment. The patient will return to radiation oncology clinic for routine followup in one month. I advised the patient to call or return sooner if they have any questions or concerns related to their recovery or treatment. ________________________________  ------------------------------------------------  Jodelle Gross, MD, PhD  This document serves as a record of services personally performed by Kyung Rudd, MD. It was created on his behalf by Rae Lips, a trained medical scribe. The creation of this record is based on the scribe's personal observations and the provider's statements to them. This document has been checked and approved by the attending provider.

## 2017-03-11 NOTE — Telephone Encounter (Signed)
Oral Oncology Patient Advocate Encounter  Prior Authorization for Temodar has been approved.    PA# [id number] Effective dates: 03/11/2017 through 03/11/2017  Oral Oncology Clinic will continue to follow.   Fabio Asa. Melynda Keller, [place] [facility] [place] [phone] 03/11/2017 4:11 PM

## 2017-03-11 NOTE — Patient Instructions (Addendum)
Temozolomide capsules What is this medicine? TEMOZOLOMIDE (te moe ZOE loe mide) is a chemotherapy drug. It is used to treat some kinds of brain cancer. This medicine may be used for other purposes; ask your health care provider or pharmacist if you have questions. COMMON BRAND NAME(S): TEMODAR What should I tell my health care provider before I take this medicine? They need to know if you have any of these conditions: -infection (especially a virus infection such as chickenpox, cold sores, or herpes) -kidney disease -liver disease -an unusual or allergic reaction to temozolomide, dacarbazine (DTIC), other medicines, foods, dyes, or preservatives -pregnant or trying to get pregnant -breast-feeding How should I use this medicine? Take this medicine by mouth with a full glass of water. Do not chew, crush or open the capsules. You can take this medicine with or without food. However, you should always take it the same way each time. Taking this medicine on an empty stomach may reduce nausea. Taking this medicine at bedtime may also reduce nausea. Follow the directions on the prescription label. Take your medicine at regular intervals. Do not take your medicine more often than directed. Do not stop taking except on your doctor's advice. Talk to your pediatrician regarding the use of this medicine in children. Special care may be needed. Overdosage: If you think you have taken too much of this medicine contact a poison control center or emergency room at once. NOTE: This medicine is only for you. Do not share this medicine with others. What if I miss a dose? If you miss a dose, talk with your health care professional about when to take your next dose. Do not take double or extra doses. If you vomit after a dose, do not take another until your next planned dose. What may interact with this medicine? -vaccines -valproic acid This list may not describe all possible interactions. Give your health care  provider a list of all the medicines, herbs, non-prescription drugs, or dietary supplements you use. Also tell them if you smoke, drink alcohol, or use illegal drugs. Some items may interact with your medicine. What should I watch for while using this medicine? This drug may make you feel generally unwell. This is not uncommon, as chemotherapy can affect healthy cells as well as cancer cells. Report any side effects. Continue your course of treatment even though you feel ill unless your doctor tells you to stop. Call your doctor or health care professional for advice if you get a fever, chills or sore throat, or other symptoms of a cold or flu. Call if you get diarrhea. Do not treat yourself. This drug decreases your body's ability to fight infections. Try to avoid being around people who are sick. This medicine may increase your risk to bruise or bleed. Call your doctor or health care professional if you notice any unusual bleeding. You may need blood work done while you are taking this medicine. Do not open the capsules of this medicine. Breathing in or touching the powder in the capsule may be harmful. If the powder accidentally gets on your skin, wash the area thoroughly. If you have difficulty swallowing, contact your doctor or health care professional. Talk to your doctor about your risk of cancer. You may be more at risk for certain types of cancers if you take this medicine. This drug may cause birth defects. Both men and women who are taking this medicine should use effective birth control. Do not become pregnant while taking this medicine. Women  should inform their doctor if they wish to become pregnant or think they might be pregnant. Men should not father a child while taking this medicine. Contact your doctor right away if your partner becomes pregnant. There is a potential for serious side effects to an unborn child. Talk to your health care professional or pharmacist for more information. Do  not breast-feed an infant while taking this medicine. What side effects may I notice from receiving this medicine? Side effects that you should report to your doctor or health care professional as soon as possible: -allergic reactions like skin rash, itching or hives, swelling of the face, lips, or tongue -breathing problems -low blood counts - this medicine may decrease the number of white blood cells, red blood cells, and platelets. You may be at increased risk for infections and bleeding. -signs of infection - fever or chills, cough, sore throat, pain or difficulty passing urine -signs of decreased platelets or bleeding - bruising, pinpoint red spots on the skin, black, tarry stools, blood in the urine -signs of decreased red blood cells - unusually weak or tired, fainting spells, lightheadedness -signs and symptoms of liver injury like dark yellow or brown urine; general ill feeling or flu-like symptoms; light-colored stools; loss of appetite; nausea; right upper belly pain; unusually weak or tired; yellowing of the eyes or skin -seizures Side effects that usually do not require medical attention (report to your doctor or health care professional if they continue or are bothersome): -constipation -diarrhea -hair loss -headache -vomiting This list may not describe all possible side effects. Call your doctor for medical advice about side effects. You may report side effects to FDA at 1-800-FDA-1088. Where should I keep my medicine? Keep out of the reach of children. Store at room temperature between 15 and 30 degrees C (59 and 86 degrees F). Throw away any unused medicine after the expiration date. NOTE: This sheet is a summary. It may not cover all possible information. If you have questions about this medicine, talk to your doctor, pharmacist, or health care provider.  2018 Elsevier/Gold Standard (2014-06-20 15:18:00)

## 2017-03-11 NOTE — Telephone Encounter (Signed)
Gave avs and calendar for November - March

## 2017-03-15 ENCOUNTER — Telehealth: Payer: Self-pay | Admitting: *Deleted

## 2017-03-15 ENCOUNTER — Ambulatory Visit: Payer: 59 | Admitting: Oncology

## 2017-03-15 MED ORDER — TEMOZOLOMIDE 140 MG PO CAPS: 150.0000 mg/m2/d | ORAL_CAPSULE | Freq: Every day | ORAL | 2 refills | Status: AC

## 2017-03-15 NOTE — Telephone Encounter (Signed)
Oral Oncology Pharmacist Encounter  Received notification from [facility] that temozolomide prescriptions are set for delivery to patient's home tomorrow 03/16/17.  Oral Oncology Clinic will continue to follow.  Johny Drilling, PharmD, BCPS, BCOP 03/15/2017 3:32 PM Oral Oncology Clinic [phone]

## 2017-03-15 NOTE — Telephone Encounter (Signed)
TCT patient regarding her request for flu shot. Spoke with patient and informed her that per Mikey Bussing, NP, it is ok for her to get her flu shot, that her platelets are at an acceptable level. Pt voiced understanding. She states she will get her flu shot @ [facility] this week.  No other questions or concerns.

## 2017-03-18 ENCOUNTER — Ambulatory Visit: Payer: 59

## 2017-03-18 ENCOUNTER — Other Ambulatory Visit: Payer: 59

## 2017-03-22 ENCOUNTER — Ambulatory Visit: Payer: 59 | Admitting: Oncology

## 2017-03-23 ENCOUNTER — Encounter: Payer: Self-pay | Admitting: Oncology

## 2017-03-23 ENCOUNTER — Ambulatory Visit (HOSPITAL_BASED_OUTPATIENT_CLINIC_OR_DEPARTMENT_OTHER): Payer: 59 | Admitting: Oncology

## 2017-03-23 ENCOUNTER — Other Ambulatory Visit (HOSPITAL_BASED_OUTPATIENT_CLINIC_OR_DEPARTMENT_OTHER): Payer: 59

## 2017-03-23 VITALS — BP 121/75 | HR 95 | Temp 98.4°F | Resp 19 | Ht 66.0 in | Wt 186.9 lb

## 2017-03-23 DIAGNOSIS — Z923 Personal history of irradiation: Secondary | ICD-10-CM | POA: Diagnosis not present

## 2017-03-23 DIAGNOSIS — C7931 Secondary malignant neoplasm of brain: Secondary | ICD-10-CM

## 2017-03-23 DIAGNOSIS — C3412 Malignant neoplasm of upper lobe, left bronchus or lung: Secondary | ICD-10-CM | POA: Diagnosis not present

## 2017-03-23 DIAGNOSIS — J910 Malignant pleural effusion: Secondary | ICD-10-CM

## 2017-03-23 DIAGNOSIS — C3492 Malignant neoplasm of unspecified part of left bronchus or lung: Secondary | ICD-10-CM

## 2017-03-23 DIAGNOSIS — R200 Anesthesia of skin: Secondary | ICD-10-CM | POA: Diagnosis not present

## 2017-03-23 DIAGNOSIS — Z5111 Encounter for antineoplastic chemotherapy: Secondary | ICD-10-CM

## 2017-03-23 LAB — COMPREHENSIVE METABOLIC PANEL
ALBUMIN: 2.5 g/dL — AB (ref 3.5–5.0)
ALK PHOS: 314 U/L — AB (ref 40–150)
ALT: 26 U/L (ref 0–55)
AST: 89 U/L — AB (ref 5–34)
Anion Gap: 13 mEq/L — ABNORMAL HIGH (ref 3–11)
BILIRUBIN TOTAL: 0.6 mg/dL (ref 0.20–1.20)
BUN: 14 mg/dL (ref 7.0–26.0)
CO2: 26 meq/L (ref 22–29)
Calcium: 9.8 mg/dL (ref 8.4–10.4)
Chloride: 96 mEq/L — ABNORMAL LOW (ref 98–109)
Creatinine: 1 mg/dL (ref 0.6–1.1)
EGFR: >60 mL/min/{1.73_m2} (ref >60)
GLUCOSE: 142 mg/dL — AB (ref 70–140)
Potassium: 3.7 mEq/L (ref 3.5–5.1)
SODIUM: 135 meq/L — AB (ref 136–145)
TOTAL PROTEIN: 7.1 g/dL (ref 6.4–8.3)

## 2017-03-23 LAB — CBC WITH DIFFERENTIAL/PLATELET
BASO%: 0.6 % (ref 0.0–2.0)
BASOS ABS: 0 10*3/uL (ref 0.0–0.1)
EOS%: 0.4 % (ref 0.0–7.0)
Eosinophils Absolute: 0 10*3/uL (ref 0.0–0.5)
HEMATOCRIT: 32.3 % — AB (ref 34.8–46.6)
HGB: 10.7 g/dL — ABNORMAL LOW (ref 11.6–15.9)
LYMPH#: 0.4 10*3/uL — AB (ref 0.9–3.3)
LYMPH%: 11 % — AB (ref 14.0–49.7)
MCH: 34.2 pg — ABNORMAL HIGH (ref 25.1–34.0)
MCHC: 33.2 g/dL (ref 31.5–36.0)
MCV: 103 fL — AB (ref 79.5–101.0)
MONO#: 0.6 10*3/uL (ref 0.1–0.9)
MONO%: 17 % — ABNORMAL HIGH (ref 0.0–14.0)
NEUT#: 2.5 10*3/uL (ref 1.5–6.5)
NEUT%: 71 % (ref 38.4–76.8)
PLATELETS: 191 10*3/uL (ref 145–400)
RBC: 3.13 10*6/uL — AB (ref 3.70–5.45)
RDW: 17.1 % — ABNORMAL HIGH (ref 11.2–14.5)
WBC: 3.5 10*3/uL — ABNORMAL LOW (ref 3.9–10.3)

## 2017-03-23 LAB — MAGNESIUM: Magnesium: 1.6 mg/dl (ref 1.5–2.5)

## 2017-03-23 NOTE — Progress Notes (Signed)
[facility] OFFICE PROGRESS NOTE  Sharon [name], [place] [address]  DIAGNOSIS: Extensive stage (T2b, N2, M1a) small cell lung cancer presented with large left upper lobe lung mass and mediastinal lymphadenopathy as well as malignant left pleural effusion diagnosed in July 2017.  PRIOR THERAPY: 1) Systemic chemotherapy with cisplatin 60 MG/M2 on day 1 and etoposide at 120 MG/M2 on days 1, 2 and 3 with Neulasta support on day 4. She is status post 6 cycles. 2) prophylactic cranial irradiation under the care of Dr. Sondra Come completed on 05/18/2016. 3) palliative radiotherapy to the enlarging left upper lobe lung nodule under the care of Dr. Sondra Come. 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 5) Systemic chemotherapy with cisplatin 30 MG/M2 and irinotecan 65 MG/M2 on days 1 and 8 every 3 weeks. First dose 10/21/2016.Status post4cycles.  CURRENT THERAPY: Temodar 280 mg daily at bedtime X 5 days.  The patient started treatment on 03/18/2017.   INTERVAL HISTORY: Sharon [name] 61 y.o. female returns for routine follow-up visit accompanied by her friend.  The patient started on Temodar and overall tolerated well with the exception of mild nausea.  Today the patient denies fevers and chills.  Denies chest pain, cough, hemoptysis.  She notices dyspnea on exertion.  Denies vomiting, constipation, diarrhea.  She reports that her appetite is good although she has lost some weight.  The patient reports right knee pain that comes and goes.  She has not taken any pain medication for this the patient notices facial numbness on her right side, but no weakness.  The patient is here for evaluation and repeat blood work.  MEDICAL HISTORY: Past Medical History:  Diagnosis Date  . Cancer of right breast ([place]) 2009  . CAP (community acquired pneumonia) 11/06/2015  . Chemotherapy induced neutropenia ([place]) 12/17/2016  . COPD (chronic  obstructive pulmonary disease) ([place])   . Elevated blood pressure   . Encounter for antineoplastic chemotherapy 01/05/2016  . History of radiation therapy 05/04/16-05/18/16   whole brain 25 Gy in 10 fractions  . History of radiation therapy 09/01/16 - 09/21/16   Left Lung treated to 35 Gy in 14 fractions  . Hoarseness of voice    "for the last 2 months" (11/06/2015)  . Hypertension   . Hypokalemia 12/17/2016  . Lung mass dx'd 10/2015  . Menopause   . Personal history of breast cancer 03/29/2016  . Pre-diabetes   . Small cell lung cancer ([place])   . Vitamin D deficiency     ALLERGIES:  is allergic to magnesium-containing compounds.  MEDICATIONS:  Current Outpatient Medications  Medication Sig Dispense Refill  . albuterol (PROVENTIL HFA;VENTOLIN HFA) 108 (90 Base) MCG/ACT inhaler Inhale 1 puff into the lungs every 6 (six) hours as needed for wheezing or shortness of breath.    . budesonide-formoterol (SYMBICORT) 160-4.5 MCG/ACT inhaler Inhale 2 puffs into the lungs 2 (two) times daily. 1 Inhaler 11  . ferrous sulfate 325 (65 FE) MG EC tablet Take 325 mg by mouth daily.    . fluticasone (FLONASE) 50 MCG/ACT nasal spray Place 1 spray into both nostrils daily.     . Ipratropium-Albuterol (COMBIVENT RESPIMAT) 20-100 MCG/ACT AERS respimat Inhale 1 puff into the lungs every 6 (six) hours.    . lidocaine-prilocaine (EMLA) cream Apply 1 application topically as needed. (Patient not taking: Reported on 02/25/2017) 30 g 2  . loperamide (IMODIUM) 2 MG capsule Take by mouth as needed for diarrhea or  loose stools.    Marland Kitchen LORazepam (ATIVAN) 0.5 MG tablet 1 tablet po 30 minutes prior to radiation or MRI (Patient not taking: Reported on 02/25/2017) 30 tablet 0  . magnesium oxide (MAG-OX) 400 (241.3 Mg) MG tablet Take 1 tablet (400 mg total) by mouth 4 (four) times daily. (Patient not taking: Reported on 02/25/2017) 120 tablet 0  . meclizine (ANTIVERT) 12.5 MG tablet Take 1-2 tablets (12.5-25 mg total) by mouth 3 (three)  times daily as needed for dizziness. (Patient not taking: Reported on 12/29/2016) 20 tablet 0  . montelukast (SINGULAIR) 10 MG tablet Take 10 mg by mouth at bedtime.   2  . ondansetron (ZOFRAN) 8 MG tablet Take 1 tablet (8 mg total) every 8 (eight) hours as needed by mouth for nausea or vomiting. 20 tablet 1  . oxyCODONE-acetaminophen (PERCOCET/ROXICET) 5-325 MG tablet Take 1 tablet by mouth every 4 (four) hours as needed for severe pain. (Patient not taking: Reported on 02/10/2017) 30 tablet 0  . polyethylene glycol powder (GLYCOLAX/MIRALAX) powder Take 1 Container by mouth daily.    . potassium chloride 20 MEQ/15ML (10%) SOLN Take 15 mLs (20 mEq total) by mouth 2 (two) times daily. 900 mL 0  . prochlorperazine (COMPAZINE) 10 MG tablet TAKE 1 TABLET BY MOUTH EVERY 6 HOURS AS NEEDED FOR NAUSEA AND VOMITING 90 tablet 0  . SANTYL ointment APP TOPICALLY UTD  0  . senna (SENOKOT) 8.6 MG TABS tablet Take 1 tablet (8.6 mg total) by mouth daily. (Patient not taking: Reported on 02/15/2017) 30 tablet 0  . sucralfate (CARAFATE) 1 GM/10ML suspension Take 10 mLs (1 g total) by mouth 4 (four) times daily -  with meals and at bedtime. (Patient not taking: Reported on 02/25/2017) 420 mL 0  . temozolomide (TEMODAR) 140 MG capsule Take 2 capsules (280 mg total) daily by mouth. Take for days 1-5 every 28 days. May take on empty stomach & at bedtime to decrease N&V. 10 capsule 2  . UNABLE TO FIND Take 10 mLs by mouth daily as needed. Med Name: CBD Oil     No current facility-administered medications for this visit.     SURGICAL HISTORY:  Past Surgical History:  Procedure Laterality Date  . BREAST BIOPSY Right 2009  . BREAST LUMPECTOMY Right 2009  . IR FLUORO GUIDE PORT INSERTION RIGHT  02/01/2017  . IR US GUIDE VASC ACCESS RIGHT  02/01/2017  . UTERINE FIBROID SURGERY  2000  . VAGINAL HYSTERECTOMY  2000  . VESICOVAGINAL FISTULA CLOSURE W/ TAH  2000  . VIDEO BRONCHOSCOPY WITH ENDOBRONCHIAL ULTRASOUND N/A 11/10/2015    Procedure: VIDEO BRONCHOSCOPY WITH ENDOBRONCHIAL ULTRASOUND;  Surgeon: Javier Glazier, MD;  Location: [facility];  Service: Thoracic;  Laterality: N/A;    REVIEW OF SYSTEMS:   Review of Systems  Constitutional: Negative for appetite change, chills, fatigue, fever. Positive for weight loss. HENT:   Negative for mouth sores, nosebleeds, sore throat and trouble swallowing.   Eyes: Negative for eye problems and icterus.  Respiratory: Negative for cough, hemoptysis, shortness of breath at rest and wheezing.  Positive for shortness of breath with exertion. Cardiovascular: Negative for chest pain and leg swelling.  Gastrointestinal: Negative for abdominal pain, constipation, diarrhea, nausea and vomiting.  Genitourinary: Negative for bladder incontinence, difficulty urinating, dysuria, frequency and hematuria.   Musculoskeletal: Negative for back pain, gait problem, neck pain and neck stiffness. Positive for right knee pain. Skin: Negative for itching and rash.  Neurological: Negative for dizziness, extremity weakness, gait problem,  headaches, light-headedness and seizures. Positive for numbness to her right side of her face. Hematological: Negative for adenopathy. Does not bruise/bleed easily.  Psychiatric/Behavioral: Negative for confusion, depression and sleep disturbance. The patient is not nervous/anxious.     PHYSICAL EXAMINATION:  Blood pressure 121/75, pulse 95, temperature 98.4 F (36.9 C), temperature source Oral, resp. rate 19, height 5\' 6"  (1.676 m), weight 186 lb 14.4 oz (84.8 kg), SpO2 100 %.  ECOG PERFORMANCE STATUS: 1 - Symptomatic but completely ambulatory  Physical Exam  Constitutional: Oriented to person, place, and time and well-developed, well-nourished, and in no distress. No distress.  HENT:  Head: Normocephalic and atraumatic.  Mouth/Throat: Oropharynx is clear and moist. No oropharyngeal exudate.  Eyes: Conjunctivae are normal. Right eye exhibits no discharge. Left  eye exhibits no discharge. No scleral icterus.  Neck: Normal range of motion. Neck supple.  Cardiovascular: Normal rate, regular rhythm, normal heart sounds and intact distal pulses.   Pulmonary/Chest: Effort normal and breath sounds normal. No respiratory distress. No wheezes. No rales.  Abdominal: Soft. Bowel sounds are normal. Exhibits no distension and no mass. There is no tenderness.  Musculoskeletal: Normal range of motion. Exhibits no edema.  Lymphadenopathy:    No cervical adenopathy.  Neurological: Alert and oriented to person, place, and time. Exhibits normal muscle tone. Gait normal. Coordination normal. Cranial nerves II through XII are grossly intact.  No decrease in sensation to the right side of her face. Skin: Skin is warm and dry. No rash noted. Not diaphoretic. No erythema. No pallor.  Psychiatric: Mood, memory and judgment normal.  Vitals reviewed.  LABORATORY DATA: Lab Results  Component Value Date   WBC 3.5 (L) 03/23/2017   HGB 10.7 (L) 03/23/2017   HCT 32.3 (L) 03/23/2017   MCV 103.0 (H) 03/23/2017   PLT 191 03/23/2017      Chemistry      Component Value Date/Time   NA 135 (L) 03/23/2017 1048   K 3.7 03/23/2017 1048   CL 98 (L) 01/09/2017 1829   CL 104 07/18/2012 1451   CO2 26 03/23/2017 1048   BUN 14.0 03/23/2017 1048   CREATININE 1.0 03/23/2017 1048      Component Value Date/Time   CALCIUM 9.8 03/23/2017 1048   ALKPHOS 314 (H) 03/23/2017 1048   AST 89 (H) 03/23/2017 1048   ALT 26 03/23/2017 1048   BILITOT 0.60 03/23/2017 1048       RADIOGRAPHIC STUDIES:  Ct Chest W Contrast  Result Date: 03/09/2017 CLINICAL DATA:  Small cell lung cancer follow-up. EXAM: CT CHEST AND ABDOMEN WITH CONTRAST TECHNIQUE: Multidetector CT imaging of the chest and abdomen was performed following the standard protocol during bolus administration of intravenous contrast. CONTRAST:  100 cc of Isovue-300 COMPARISON:  12/24/2016. FINDINGS: CT CHEST FINDINGS Cardiovascular:  The heart size appears normal. No pericardial effusion identified. Mediastinum/Nodes: The trachea appears patent. Normal appearance of the esophagus. Index left supraclavicular lymph node Measures 1.1 cm, image 7 of series 2. Previously 1.9 cm. 1.2 cm right paratracheal lymph node identified, image 16 of series 2. Previously 1 cm. The large left-sided AP window lymph node measuring 2.4 cm on the previous exam has resolved in the interval. Sub- carinal lymph node measures 1.1 cm, image 31 of series 2. Previously 0.8 cm. Lungs/Pleura: No pleural effusion. Linear masslike subpleural architectural distortion and fibrosis within the left upper lobe appears increased from previous exam, image number 52 of series 6. The index nodule within the left upper lobe measures 1.5 cm,  image 51 of series 6. Previously 1.3 cm. Moderate changes of centrilobular emphysema. Musculoskeletal: No chest wall mass or suspicious bone lesions identified. CT ABDOMEN FINDINGS Hepatobiliary: Liver hemangioma along the posterior dome is again noted measuring approximately 2.2 cm. New subcentimeter intermediate to low attenuation foci are identified throughout both lobes of liver. These are too numerous to count, suspicious for liver metastasis. Several larger peripheral enhancing lesions are also new from previous exam and worrisome for metastatic disease. For example within segment 5 there is a 1.4 cm target lesion, image 75 of series 2. Pancreas: Unremarkable. No pancreatic ductal dilatation or surrounding inflammatory changes. Spleen: Normal in size without focal abnormality. Adrenals/Urinary Tract: Normal appearance of the left adrenal gland. 1.2 cm right adrenal nodule is unchanged, image 59 of series 2. 5 mm low density structure within the lower pole of right kidney is too small to characterize. Stomach/Bowel: Stomach is within normal limits. Appendix appears normal. No evidence of bowel wall thickening, distention, or inflammatory changes.  Vascular/Lymphatic: Aortic atherosclerosis.  No aneurysm. Other: No abdominal wall hernia or abnormality. No abdominopelvic ascites. Musculoskeletal: Scattered small sclerotic foci are identified within the lower lumbar spine and pelvis which are suspicious for metastatic disease. IMPRESSION: 1. There has been an interval mixed response to therapy. 2. Decrease in size of large left supraclavicular and left AP window lymph nodes 3. New multifocal liver metastasis. 4. Increase in size of sub- carinal and right paratracheal lymph nodes. Index lesion within the left upper lobe is slightly increased in size in the interval. 5. Subtle foci of sclerosis within the lumbar spine and pelvis worrisome for metastatic disease. Findings may reflect treatment changes of previous occult bone metastases. Electronically Signed   By: Kerby Moors M.D.   On: 03/09/2017 15:21   Ct Abdomen W Contrast  Result Date: 03/09/2017 CLINICAL DATA:  Small cell lung cancer follow-up. EXAM: CT CHEST AND ABDOMEN WITH CONTRAST TECHNIQUE: Multidetector CT imaging of the chest and abdomen was performed following the standard protocol during bolus administration of intravenous contrast. CONTRAST:  100 cc of Isovue-300 COMPARISON:  12/24/2016. FINDINGS: CT CHEST FINDINGS Cardiovascular: The heart size appears normal. No pericardial effusion identified. Mediastinum/Nodes: The trachea appears patent. Normal appearance of the esophagus. Index left supraclavicular lymph node Measures 1.1 cm, image 7 of series 2. Previously 1.9 cm. 1.2 cm right paratracheal lymph node identified, image 16 of series 2. Previously 1 cm. The large left-sided AP window lymph node measuring 2.4 cm on the previous exam has resolved in the interval. Sub- carinal lymph node measures 1.1 cm, image 31 of series 2. Previously 0.8 cm. Lungs/Pleura: No pleural effusion. Linear masslike subpleural architectural distortion and fibrosis within the left upper lobe appears increased  from previous exam, image number 52 of series 6. The index nodule within the left upper lobe measures 1.5 cm, image 51 of series 6. Previously 1.3 cm. Moderate changes of centrilobular emphysema. Musculoskeletal: No chest wall mass or suspicious bone lesions identified. CT ABDOMEN FINDINGS Hepatobiliary: Liver hemangioma along the posterior dome is again noted measuring approximately 2.2 cm. New subcentimeter intermediate to low attenuation foci are identified throughout both lobes of liver. These are too numerous to count, suspicious for liver metastasis. Several larger peripheral enhancing lesions are also new from previous exam and worrisome for metastatic disease. For example within segment 5 there is a 1.4 cm target lesion, image 75 of series 2. Pancreas: Unremarkable. No pancreatic ductal dilatation or surrounding inflammatory changes. Spleen: Normal in size without focal  abnormality. Adrenals/Urinary Tract: Normal appearance of the left adrenal gland. 1.2 cm right adrenal nodule is unchanged, image 59 of series 2. 5 mm low density structure within the lower pole of right kidney is too small to characterize. Stomach/Bowel: Stomach is within normal limits. Appendix appears normal. No evidence of bowel wall thickening, distention, or inflammatory changes. Vascular/Lymphatic: Aortic atherosclerosis.  No aneurysm. Other: No abdominal wall hernia or abnormality. No abdominopelvic ascites. Musculoskeletal: Scattered small sclerotic foci are identified within the lower lumbar spine and pelvis which are suspicious for metastatic disease. IMPRESSION: 1. There has been an interval mixed response to therapy. 2. Decrease in size of large left supraclavicular and left AP window lymph nodes 3. New multifocal liver metastasis. 4. Increase in size of sub- carinal and right paratracheal lymph nodes. Index lesion within the left upper lobe is slightly increased in size in the interval. 5. Subtle foci of sclerosis within the  lumbar spine and pelvis worrisome for metastatic disease. Findings may reflect treatment changes of previous occult bone metastases. Electronically Signed   By: Kerby Moors M.D.   On: 03/09/2017 15:21     ASSESSMENT/PLAN:  Primary small cell carcinoma of left lung ([place]) This is a very pleasant 61year old white female with extensive stage small cell lung cancer status post 6 cycles of systemic chemotherapy with cisplatin and etoposide followed by prophylactic cranial irradiation followed by palliative radiotherapy to a left upper lobe pulmonary nodule followed by stereotactic radiotherapy to solitary brain metastasis.This was followed by disease progression with new bulky left supraclavicular lymphadenopathy as well as new enlarged mediastinal left hilar lymph nodes. The patient was started on systemic chemotherapy with cisplatin 30 MG/M2 and irinotecan 65 MG/M2 on days 1 and 8 every 3 weeks status post4cycles. Tolerated her treatment well except for neutropenia, thrombocytopenia, intermittent nausea and vomiting, and diarrhea.  Unfortunately, the patient had progression of disease. The patient is now on Temodar 280 mg p.o. days 1 through 5 of a 28-day cycle.  Tolerated her first cycle well overall with exception of mild fatigue, and intermittent nausea.  Labs are reviewed and are overall stable.  She has mild elevation in her AST which we will watch.  We will plan to bring the patient back in approximately 2 weeks for repeat lab work and evaluation prior to beginning cycle 2 of her Temodar.  Patient is having right knee pain and I have offered to perform an x-ray on this.  She has declined.  I have encouraged her to use her oxycodone which she has at home as needed to control her pain.  The patient has facial numbness.  There are no neurological deficits noted on exam.  The patient could have some numbness related to her metastatic brain disease.  We will continue to monitor this.  The  patient was advised to call immediately if she has any concerning symptoms in the interval. The patient voices understanding of current disease status and treatment options and is in agreement with the current care plan. All questions were answered. The patient knows to call the clinic with any problems, questions or concerns. We can certainly see the patient much sooner if necessary.   Orders Placed This Encounter  Procedures  . Comprehensive metabolic panel    Standing Status:   Future    Standing Expiration Date:   109-10-2017  . CBC with Differential/Platelet    Standing Status:   Future    Standing Expiration Date:   109-10-2017  Mikey Bussing, DNP, AGPCNP-BC, AOCNP 03/24/17

## 2017-03-24 ENCOUNTER — Telehealth: Payer: Self-pay | Admitting: Oncology

## 2017-03-24 ENCOUNTER — Telehealth: Payer: Self-pay | Admitting: Medical Oncology

## 2017-03-24 NOTE — Telephone Encounter (Signed)
Pt referred to palliative care in aug and oct . They cannot reach pt . Is this still appropriate referral?

## 2017-03-24 NOTE — Telephone Encounter (Signed)
If she's not interested that's ok. If she changes her mind she can just let us know or you guys! Thanks, Bryson Ha

## 2017-03-24 NOTE — Telephone Encounter (Signed)
I am not sure who referred her to them. She would be ok for palliative care if she is agreeable, but not for Hospice since she is on active treatment still.

## 2017-03-24 NOTE — Telephone Encounter (Signed)
Scheduled appt per 11/28 los - Sent reminder letter in the mail with appt date and time .

## 2017-03-24 NOTE — Assessment & Plan Note (Signed)
This is a very pleasant 61year old white female with extensive stage small cell lung cancer status post 6 cycles of systemic chemotherapy with cisplatin and etoposide followed by prophylactic cranial irradiation followed by palliative radiotherapy to a left upper lobe pulmonary nodule followed by stereotactic radiotherapy to solitary brain metastasis.This was followed by disease progression with new bulky left supraclavicular lymphadenopathy as well as new enlarged mediastinal left hilar lymph nodes. The patient was started on systemic chemotherapy with cisplatin 30 MG/M2 and irinotecan 65 MG/M2 on days 1 and 8 every 3 weeks status post4cycles. Tolerated her treatment well except for neutropenia, thrombocytopenia, intermittent nausea and vomiting, and diarrhea.  Unfortunately, the patient had progression of disease. The patient is now on Temodar 280 mg p.o. days 1 through 5 of a 28-day cycle.  Tolerated her first cycle well overall with exception of mild fatigue, and intermittent nausea.  Labs are reviewed and are overall stable.  She has mild elevation in her AST which we will watch.  We will plan to bring the patient back in approximately 2 weeks for repeat lab work and evaluation prior to beginning cycle 2 of her Temodar.  Patient is having right knee pain and I have offered to perform an x-ray on this.  She has declined.  I have encouraged her to use her oxycodone which she has at home as needed to control her pain.  The patient has facial numbness.  There are no neurological deficits noted on exam.  The patient could have some numbness related to her metastatic brain disease.  We will continue to monitor this.  The patient was advised to call immediately if she has any concerning symptoms in the interval. The patient voices understanding of current disease status and treatment options and is in agreement with the current care plan. All questions were answered. The patient knows to call the  clinic with any problems, questions or concerns. We can certainly see the patient much sooner if necessary.

## 2017-03-25 ENCOUNTER — Ambulatory Visit: Payer: 59

## 2017-03-25 ENCOUNTER — Other Ambulatory Visit: Payer: 59

## 2017-03-25 ENCOUNTER — Telehealth: Payer: Self-pay | Admitting: Medical Oncology

## 2017-03-25 NOTE — Telephone Encounter (Signed)
[facility] called and stated Pt called them about missing a dose of tarceva. Lattie Haw said [facility] nurse counted #10 tablets and said she had enough for 5 days.

## 2017-03-25 NOTE — Telephone Encounter (Signed)
LM for [facility].

## 2017-03-29 ENCOUNTER — Ambulatory Visit
Admission: RE | Admit: 2017-03-29 | Discharge: 2017-03-29 | Disposition: A | Discharge status: Home / Self Care | Payer: 59 | Source: Ambulatory Visit | Attending: Radiation Oncology | Admitting: Radiation Oncology

## 2017-03-29 ENCOUNTER — Encounter: Payer: Self-pay | Admitting: Radiation Oncology

## 2017-03-29 ENCOUNTER — Other Ambulatory Visit: Payer: Self-pay

## 2017-03-29 VITALS — BP 103/70 | HR 85 | Temp 99.0°F | Resp 18 | Ht 66.0 in | Wt 186.6 lb

## 2017-03-29 DIAGNOSIS — Z79899 Other long term (current) drug therapy: Secondary | ICD-10-CM | POA: Diagnosis not present

## 2017-03-29 DIAGNOSIS — C801 Malignant (primary) neoplasm, unspecified: Secondary | ICD-10-CM | POA: Insufficient documentation

## 2017-03-29 DIAGNOSIS — C7931 Secondary malignant neoplasm of brain: Secondary | ICD-10-CM | POA: Insufficient documentation

## 2017-03-29 DIAGNOSIS — Z7951 Long term (current) use of inhaled steroids: Secondary | ICD-10-CM | POA: Diagnosis not present

## 2017-03-29 DIAGNOSIS — Z923 Personal history of irradiation: Secondary | ICD-10-CM | POA: Diagnosis not present

## 2017-03-29 NOTE — Progress Notes (Signed)
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 Cerebellum 52mm PTV3: LT vermis 51mm PTV4: Lt hypothalamus 45mm PTV5: Rt occipital 2 mm PTV6: Lt occipital 3 mm PTV7: Rt corpus collosum 33mm PTV8: Rt. third ventricle 5 mm PTV9: Rt parietal 66mm PTV10: Rt frontal 68mm All sites were treated to 20 Gy in 1 fraction using the SBRT/SRT-3D technique.  01/10/2017 to 01/27/2017: The chest was treated to 35 Gy in 14 fractions of 2.5 Gy.  10/07/16 SRS Treatment: PTV1Lt Frontal Parietal  21 mm target was treated using 4Arcs to a prescription dose of 18Gy. ExacTrac Snap verification was performed for each couch angle.   09/01/16-09/21/16: palliative radiotherapy to the chest 35 Gy in 14 fractions  05/04/16-05/18/16 PCI: 25 Gy in 10 fractions to the whole brain   Narrative:  The patient returns today for routine follow-up.  She continues on Tagrisso, and states that she feels pretty good since her treatment. She denies any use of dexamethasone, headaches, visual or auditory changes, dizziness, or balance issues. No nausea is present, but last week she had a difficult time with an epsiode of nausea, but found relief with Zofran. No other complaints are verbalized.                   ALLERGIES:  is allergic to magnesium-containing compounds.  Meds: Current Outpatient Medications  Medication Sig Dispense Refill  . albuterol (PROVENTIL HFA;VENTOLIN HFA) 108 (90 Base) MCG/ACT inhaler Inhale 1 puff into the lungs every 6 (six) hours as needed for wheezing or shortness of breath.    . budesonide-formoterol (SYMBICORT) 160-4.5 MCG/ACT inhaler Inhale 2 puffs into the  lungs 2 (two) times daily. 1 Inhaler 11  . fluticasone (FLONASE) 50 MCG/ACT nasal spray Place 1 spray into both nostrils daily.     . Ipratropium-Albuterol (COMBIVENT RESPIMAT) 20-100 MCG/ACT AERS respimat Inhale 1 puff into the lungs every 6 (six) hours.    . lidocaine-prilocaine (EMLA) cream Apply 1 application topically as needed. 30 g 2  . magnesium oxide (MAG-OX) 400 (241.3 Mg) MG tablet Take 1 tablet (400 mg total) by mouth 4 (four) times daily. 120 tablet 0  . montelukast (SINGULAIR) 10 MG tablet Take 10 mg by mouth at bedtime.   2  . ondansetron (ZOFRAN) 8 MG tablet Take 1 tablet (8 mg total) every 8 (eight) hours as needed by mouth for nausea or vomiting. 20 tablet 1  . oxyCODONE-acetaminophen (PERCOCET/ROXICET) 5-325 MG tablet Take 1 tablet by mouth every 4 (four) hours as needed for severe pain. 30 tablet 0  . potassium chloride 20 MEQ/15ML (10%) SOLN Take 15 mLs (20 mEq total) by mouth 2 (two) times daily. 900 mL 0  . prochlorperazine (COMPAZINE) 10 MG tablet TAKE 1 TABLET BY MOUTH EVERY 6 HOURS AS NEEDED FOR NAUSEA AND VOMITING 90 tablet 0  . UNABLE TO FIND Take 10 mLs by mouth daily as needed. Med Name: CBD Oil    . ferrous sulfate 325 (65 FE) MG EC tablet Take 325 mg by mouth daily.    Marland Kitchen loperamide (IMODIUM) 2 MG capsule Take by mouth as needed for diarrhea or loose stools.    Marland Kitchen  LORazepam (ATIVAN) 0.5 MG tablet 1 tablet po 30 minutes prior to radiation or MRI (Patient not taking: Reported on 02/25/2017) 30 tablet 0  . meclizine (ANTIVERT) 12.5 MG tablet Take 1-2 tablets (12.5-25 mg total) by mouth 3 (three) times daily as needed for dizziness. (Patient not taking: Reported on 12/29/2016) 20 tablet 0  . polyethylene glycol powder (GLYCOLAX/MIRALAX) powder Take 1 Container by mouth daily.    Marland Kitchen SANTYL ointment APP TOPICALLY UTD  0  . senna (SENOKOT) 8.6 MG TABS tablet Take 1 tablet (8.6 mg total) by mouth daily. (Patient not taking: Reported on 02/15/2017) 30 tablet 0  . sucralfate  (CARAFATE) 1 GM/10ML suspension Take 10 mLs (1 g total) by mouth 4 (four) times daily -  with meals and at bedtime. (Patient not taking: Reported on 02/25/2017) 420 mL 0  . temozolomide (TEMODAR) 140 MG capsule Take 2 capsules (280 mg total) daily by mouth. Take for days 1-5 every 28 days. May take on empty stomach & at bedtime to decrease N&V. (Patient not taking: Reported on 03/29/2017) 10 capsule 2   No current facility-administered medications for this encounter.     Physical Findings:  height is 5\' 6"  (1.676 m) and weight is 186 lb 9.6 oz (84.6 kg). Her oral temperature is 99 F (37.2 C). Her blood pressure is 103/70 and her pulse is 85. Her respiration is 18 and oxygen saturation is 99%.  Pain Assessment Pain Score: 0-No pain/10 In general this is a well appearing caucasian female in no acute distress. She's alert and oriented x4 and appropriate throughout the examination. Cardiopulmonary assessment is negative for acute distress and she exhibits normal effort. No focal neurologic defects are grossly noted.  Lab Findings: Lab Results  Component Value Date   WBC 3.5 (L) 03/23/2017   HGB 10.7 (L) 03/23/2017   HCT 32.3 (L) 03/23/2017   MCV 103.0 (H) 03/23/2017   PLT 191 03/23/2017     Radiographic Findings: Ct Chest W Contrast  Result Date: 03/09/2017 CLINICAL DATA:  Small cell lung cancer follow-up. EXAM: CT CHEST AND ABDOMEN WITH CONTRAST TECHNIQUE: Multidetector CT imaging of the chest and abdomen was performed following the standard protocol during bolus administration of intravenous contrast. CONTRAST:  100 cc of Isovue-300 COMPARISON:  12/24/2016. FINDINGS: CT CHEST FINDINGS Cardiovascular: The heart size appears normal. No pericardial effusion identified. Mediastinum/Nodes: The trachea appears patent. Normal appearance of the esophagus. Index left supraclavicular lymph node Measures 1.1 cm, image 7 of series 2. Previously 1.9 cm. 1.2 cm right paratracheal lymph node identified,  image 16 of series 2. Previously 1 cm. The large left-sided AP window lymph node measuring 2.4 cm on the previous exam has resolved in the interval. Sub- carinal lymph node measures 1.1 cm, image 31 of series 2. Previously 0.8 cm. Lungs/Pleura: No pleural effusion. Linear masslike subpleural architectural distortion and fibrosis within the left upper lobe appears increased from previous exam, image number 52 of series 6. The index nodule within the left upper lobe measures 1.5 cm, image 51 of series 6. Previously 1.3 cm. Moderate changes of centrilobular emphysema. Musculoskeletal: No chest wall mass or suspicious bone lesions identified. CT ABDOMEN FINDINGS Hepatobiliary: Liver hemangioma along the posterior dome is again noted measuring approximately 2.2 cm. New subcentimeter intermediate to low attenuation foci are identified throughout both lobes of liver. These are too numerous to count, suspicious for liver metastasis. Several larger peripheral enhancing lesions are also new from previous exam and worrisome for metastatic disease. For example within  segment 5 there is a 1.4 cm target lesion, image 75 of series 2. Pancreas: Unremarkable. No pancreatic ductal dilatation or surrounding inflammatory changes. Spleen: Normal in size without focal abnormality. Adrenals/Urinary Tract: Normal appearance of the left adrenal gland. 1.2 cm right adrenal nodule is unchanged, image 59 of series 2. 5 mm low density structure within the lower pole of right kidney is too small to characterize. Stomach/Bowel: Stomach is within normal limits. Appendix appears normal. No evidence of bowel wall thickening, distention, or inflammatory changes. Vascular/Lymphatic: Aortic atherosclerosis.  No aneurysm. Other: No abdominal wall hernia or abnormality. No abdominopelvic ascites. Musculoskeletal: Scattered small sclerotic foci are identified within the lower lumbar spine and pelvis which are suspicious for metastatic disease. IMPRESSION:  1. There has been an interval mixed response to therapy. 2. Decrease in size of large left supraclavicular and left AP window lymph nodes 3. New multifocal liver metastasis. 4. Increase in size of sub- carinal and right paratracheal lymph nodes. Index lesion within the left upper lobe is slightly increased in size in the interval. 5. Subtle foci of sclerosis within the lumbar spine and pelvis worrisome for metastatic disease. Findings may reflect treatment changes of previous occult bone metastases. Electronically Signed   By: Kerby Moors M.D.   On: 03/09/2017 15:21   Ct Abdomen W Contrast  Result Date: 03/09/2017 CLINICAL DATA:  Small cell lung cancer follow-up. EXAM: CT CHEST AND ABDOMEN WITH CONTRAST TECHNIQUE: Multidetector CT imaging of the chest and abdomen was performed following the standard protocol during bolus administration of intravenous contrast. CONTRAST:  100 cc of Isovue-300 COMPARISON:  12/24/2016. FINDINGS: CT CHEST FINDINGS Cardiovascular: The heart size appears normal. No pericardial effusion identified. Mediastinum/Nodes: The trachea appears patent. Normal appearance of the esophagus. Index left supraclavicular lymph node Measures 1.1 cm, image 7 of series 2. Previously 1.9 cm. 1.2 cm right paratracheal lymph node identified, image 16 of series 2. Previously 1 cm. The large left-sided AP window lymph node measuring 2.4 cm on the previous exam has resolved in the interval. Sub- carinal lymph node measures 1.1 cm, image 31 of series 2. Previously 0.8 cm. Lungs/Pleura: No pleural effusion. Linear masslike subpleural architectural distortion and fibrosis within the left upper lobe appears increased from previous exam, image number 52 of series 6. The index nodule within the left upper lobe measures 1.5 cm, image 51 of series 6. Previously 1.3 cm. Moderate changes of centrilobular emphysema. Musculoskeletal: No chest wall mass or suspicious bone lesions identified. CT ABDOMEN FINDINGS  Hepatobiliary: Liver hemangioma along the posterior dome is again noted measuring approximately 2.2 cm. New subcentimeter intermediate to low attenuation foci are identified throughout both lobes of liver. These are too numerous to count, suspicious for liver metastasis. Several larger peripheral enhancing lesions are also new from previous exam and worrisome for metastatic disease. For example within segment 5 there is a 1.4 cm target lesion, image 75 of series 2. Pancreas: Unremarkable. No pancreatic ductal dilatation or surrounding inflammatory changes. Spleen: Normal in size without focal abnormality. Adrenals/Urinary Tract: Normal appearance of the left adrenal gland. 1.2 cm right adrenal nodule is unchanged, image 59 of series 2. 5 mm low density structure within the lower pole of right kidney is too small to characterize. Stomach/Bowel: Stomach is within normal limits. Appendix appears normal. No evidence of bowel wall thickening, distention, or inflammatory changes. Vascular/Lymphatic: Aortic atherosclerosis.  No aneurysm. Other: No abdominal wall hernia or abnormality. No abdominopelvic ascites. Musculoskeletal: Scattered small sclerotic foci are identified within the  lower lumbar spine and pelvis which are suspicious for metastatic disease. IMPRESSION: 1. There has been an interval mixed response to therapy. 2. Decrease in size of large left supraclavicular and left AP window lymph nodes 3. New multifocal liver metastasis. 4. Increase in size of sub- carinal and right paratracheal lymph nodes. Index lesion within the left upper lobe is slightly increased in size in the interval. 5. Subtle foci of sclerosis within the lumbar spine and pelvis worrisome for metastatic disease. Findings may reflect treatment changes of previous occult bone metastases. Electronically Signed   By: Kerby Moors M.D.   On: 03/09/2017 15:21    Impression/Plan: 1. Extensive stage small cell carcinoma with metastatic disease  the brain. The patient tolerated radiotherapy well. She will proceed with repeat MRI in about 2 months time. She states agreement and understanding, and will continue Tagrisso with Dr. Julien Nordmann in the meantime.      Carola Rhine, PAC

## 2017-04-01 ENCOUNTER — Other Ambulatory Visit: Payer: 59

## 2017-04-01 ENCOUNTER — Ambulatory Visit: Payer: 59

## 2017-04-05 ENCOUNTER — Other Ambulatory Visit: Payer: 59

## 2017-04-05 ENCOUNTER — Ambulatory Visit: Payer: 59 | Admitting: Internal Medicine

## 2017-04-06 NOTE — Progress Notes (Signed)
  Radiation Oncology         [phone] ________________________________  Name: Sharon [name] MRN: [id number]  Date: 02/17/2017  DOB: Apr 19, 1956  DIAGNOSIS:     ICD-10-CM   1. Brain metastasis ([place]) C79.31     NARRATIVE:  The patient was brought to the [facility].  Identity was confirmed.  All relevant records and images related to the planned course of therapy were reviewed.  The patient freely provided informed written consent to proceed with treatment after reviewing the details related to the planned course of therapy. The consent form was witnessed and verified by the simulation staff. Intravenous access was established for contrast administration. Then, the patient was set-up in a stable reproducible supine position for radiation therapy.  A relocatable thermoplastic stereotactic head frame was fabricated for precise immobilization.  CT images were obtained.  Surface markings were placed.  The CT images were loaded into the planning software and fused with the patient's targeting MRI scan.  Then the target and avoidance structures were contoured.  Treatment planning then occurred.  The radiation prescription was entered and confirmed.  I have requested 3D planning  I have requested a DVH of the following structures: Brain stem, brain, left eye, right eye, lenses, optic chiasm, target volumes, uninvolved brain, and normal tissue.    SPECIAL TREATMENT PROCEDURE:  The planned course of therapy using radiation constitutes a special treatment procedure. Special care is required in the management of this patient for the following reasons. This treatment constitutes a Special Treatment Procedure for the following reason: High dose per fraction requiring special monitoring for increased toxicities of treatment including daily imaging.  The special nature of the planned course of radiotherapy will require increased physician supervision and oversight to ensure patient's safety with  optimal treatment outcomes.  PLAN:  The patient will receive 20 Gy in 1 fraction to 9 intracranial lesions.   ------------------------------------------------  Jodelle Gross, MD, PhD

## 2017-04-06 NOTE — Progress Notes (Signed)
  Radiation Oncology         (336) [phone] ________________________________  Name: Sharon [name] MRN: [id number]  Date: 02/22/2017  DOB: [birth year]   SPECIAL TREATMENT PROCEDURE   3D TREATMENT PLANNING AND DOSIMETRY: The patient's radiation plan was reviewed and approved by Dr. Vertell Limber from neurosurgery and radiation oncology prior to treatment. It showed 3-dimensional radiation distributions overlaid onto the planning CT/MRI image set. The [facility] for the target structures as well as the organs at risk were reviewed. The documentation of the 3D plan and dosimetry are filed in the radiation oncology EMR.   NARRATIVE: The patient was brought to the [employer] stereotactic radiation treatment machine and placed supine on the CT couch. The head frame was applied, and the patient was set up for stereotactic radiosurgery. Neurosurgery was present for the set-up and delivery   SIMULATION VERIFICATION: In the couch zero-angle position, the patient underwent Exactrac imaging using the Brainlab system with orthogonal KV images. These were carefully aligned and repeated to confirm treatment position for each of the isocenters. The Exactrac snap film verification was repeated at each couch angle.   SPECIAL TREATMENT PROCEDURE: The patient received stereotactic radiosurgery to the following target:  PTV1-9 targets was treated using 6 Arcs to a prescription dose of 20 Gy. ExacTrac Snap verification was performed for each couch angle.   STEREOTACTIC TREATMENT MANAGEMENT: Following delivery, the patient was transported to nursing in stable condition and monitored for possible acute effects. Vital signs were recorded . The patient tolerated treatment without significant acute effects, and was discharged to home in stable condition.  PLAN: Follow-up in one month.   ------------------------------------------------  Jodelle Gross, MD, PhD

## 2017-04-13 ENCOUNTER — Other Ambulatory Visit (HOSPITAL_BASED_OUTPATIENT_CLINIC_OR_DEPARTMENT_OTHER): Payer: 59

## 2017-04-13 ENCOUNTER — Ambulatory Visit (HOSPITAL_BASED_OUTPATIENT_CLINIC_OR_DEPARTMENT_OTHER): Payer: 59 | Admitting: Oncology

## 2017-04-13 ENCOUNTER — Ambulatory Visit (HOSPITAL_BASED_OUTPATIENT_CLINIC_OR_DEPARTMENT_OTHER): Payer: 59

## 2017-04-13 VITALS — BP 101/63 | HR 116 | Temp 98.2°F | Resp 18 | Ht 66.0 in | Wt 182.0 lb

## 2017-04-13 DIAGNOSIS — J910 Malignant pleural effusion: Secondary | ICD-10-CM | POA: Diagnosis not present

## 2017-04-13 DIAGNOSIS — Z923 Personal history of irradiation: Secondary | ICD-10-CM

## 2017-04-13 DIAGNOSIS — C7931 Secondary malignant neoplasm of brain: Secondary | ICD-10-CM

## 2017-04-13 DIAGNOSIS — C3412 Malignant neoplasm of upper lobe, left bronchus or lung: Secondary | ICD-10-CM | POA: Diagnosis not present

## 2017-04-13 DIAGNOSIS — C3492 Malignant neoplasm of unspecified part of left bronchus or lung: Secondary | ICD-10-CM

## 2017-04-13 DIAGNOSIS — Z5111 Encounter for antineoplastic chemotherapy: Secondary | ICD-10-CM

## 2017-04-13 DIAGNOSIS — Z95828 Presence of other vascular implants and grafts: Secondary | ICD-10-CM

## 2017-04-13 LAB — COMPREHENSIVE METABOLIC PANEL
ALBUMIN: 2.9 g/dL — AB (ref 3.5–5.0)
ALT: 49 U/L (ref 0–55)
ANION GAP: 13 meq/L — AB (ref 3–11)
AST: 127 U/L — AB (ref 5–34)
Alkaline Phosphatase: 483 U/L — ABNORMAL HIGH (ref 40–150)
BILIRUBIN TOTAL: 0.97 mg/dL (ref 0.20–1.20)
BUN: 9.4 mg/dL (ref 7.0–26.0)
CALCIUM: 10 mg/dL (ref 8.4–10.4)
CHLORIDE: 96 meq/L — AB (ref 98–109)
CO2: 24 mEq/L (ref 22–29)
CREATININE: 1 mg/dL (ref 0.6–1.1)
EGFR: >60 mL/min/{1.73_m2} (ref >60)
Glucose: 101 mg/dl (ref 70–140)
Potassium: 3.6 mEq/L (ref 3.5–5.1)
Sodium: 133 mEq/L — ABNORMAL LOW (ref 136–145)
TOTAL PROTEIN: 7.2 g/dL (ref 6.4–8.3)

## 2017-04-13 LAB — CBC WITH DIFFERENTIAL/PLATELET
BASO%: 0.3 % (ref 0.0–2.0)
Basophils Absolute: 0 10*3/uL (ref 0.0–0.1)
EOS ABS: 0.1 10*3/uL (ref 0.0–0.5)
EOS%: 0.6 % (ref 0.0–7.0)
HCT: 32 % — ABNORMAL LOW (ref 34.8–46.6)
HGB: 10.8 g/dL — ABNORMAL LOW (ref 11.6–15.9)
LYMPH%: 14 % (ref 14.0–49.7)
MCH: 34.8 pg — AB (ref 25.1–34.0)
MCHC: 33.8 g/dL (ref 31.5–36.0)
MCV: 103.2 fL — AB (ref 79.5–101.0)
MONO#: 0.7 10*3/uL (ref 0.1–0.9)
MONO%: 6 % (ref 0.0–14.0)
NEUT%: 79.1 % — ABNORMAL HIGH (ref 38.4–76.8)
NEUTROS ABS: 9.4 10*3/uL — AB (ref 1.5–6.5)
Platelets: 192 10*3/uL (ref 145–400)
RBC: 3.1 10*6/uL — AB (ref 3.70–5.45)
RDW: 17.3 % — AB (ref 11.2–14.5)
WBC: 11.9 10*3/uL — AB (ref 3.9–10.3)
lymph#: 1.7 10*3/uL (ref 0.9–3.3)

## 2017-04-13 LAB — MAGNESIUM: MAGNESIUM: 1.5 mg/dL (ref 1.5–2.5)

## 2017-04-13 MED ORDER — SODIUM CHLORIDE 0.9% FLUSH
10.0000 mL | INTRAVENOUS | Status: DC | PRN
  Administered 2017-04-13: 10 mL via INTRAVENOUS
  Filled 2017-04-13: qty 10

## 2017-04-13 MED ORDER — METHYLPREDNISOLONE 4 MG PO TABS: ORAL_TABLET | ORAL | 0 refills | Status: DC

## 2017-04-13 MED ORDER — HEPARIN SOD (PORK) LOCK FLUSH 100 UNIT/ML IV SOLN
500.0000 [IU] | Freq: Once | INTRAVENOUS | Status: AC
  Administered 2017-04-13: 500 [IU] via INTRAVENOUS
  Filled 2017-04-13: qty 5

## 2017-04-13 NOTE — Addendum Note (Signed)
Addended by: Marshell Garfinkel on: 04/13/2017 11:39 AM   Modules accepted: Orders, SmartSet

## 2017-04-13 NOTE — Patient Instructions (Signed)
Implanted Port Home Guide An implanted port is a type of central line that is placed under the skin. Central lines are used to provide IV access when treatment or nutrition needs to be given through a person's veins. Implanted ports are used for long-term IV access. An implanted port may be placed because:  You need IV medicine that would be irritating to the small veins in your hands or arms.  You need long-term IV medicines, such as antibiotics.  You need IV nutrition for a long period.  You need frequent blood draws for lab tests.  You need dialysis.  Implanted ports are usually placed in the chest area, but they can also be placed in the upper arm, the abdomen, or the leg. An implanted port has two main parts:  Reservoir. The reservoir is round and will appear as a small, raised area under your skin. The reservoir is the part where a needle is inserted to give medicines or draw blood.  Catheter. The catheter is a thin, flexible tube that extends from the reservoir. The catheter is placed into a large vein. Medicine that is inserted into the reservoir goes into the catheter and then into the vein.  How will I care for my incision site? Do not get the incision site wet. Bathe or shower as directed by your health care provider. How is my port accessed? Special steps must be taken to access the port:  Before the port is accessed, a numbing cream can be placed on the skin. This helps numb the skin over the port site.  Your health care provider uses a sterile technique to access the port. ? Your health care provider must put on a mask and sterile gloves. ? The skin over your port is cleaned carefully with an antiseptic and allowed to dry. ? The port is gently pinched between sterile gloves, and a needle is inserted into the port.  Only "non-coring" port needles should be used to access the port. Once the port is accessed, a blood return should be checked. This helps ensure that the port  is in the vein and is not clogged.  If your port needs to remain accessed for a constant infusion, a clear (transparent) bandage will be placed over the needle site. The bandage and needle will need to be changed every week, or as directed by your health care provider.  Keep the bandage covering the needle clean and dry. Do not get it wet. Follow your health care provider's instructions on how to take a shower or bath while the port is accessed.  If your port does not need to stay accessed, no bandage is needed over the port.  What is flushing? Flushing helps keep the port from getting clogged. Follow your health care provider's instructions on how and when to flush the port. Ports are usually flushed with saline solution or a medicine called heparin. The need for flushing will depend on how the port is used.  If the port is used for intermittent medicines or blood draws, the port will need to be flushed: ? After medicines have been given. ? After blood has been drawn. ? As part of routine maintenance.  If a constant infusion is running, the port may not need to be flushed.  How long will my port stay implanted? The port can stay in for as long as your health care provider thinks it is needed. When it is time for the port to come out, surgery will be   done to remove it. The procedure is similar to the one performed when the port was put in. When should I seek immediate medical care? When you have an implanted port, you should seek immediate medical care if:  You notice a bad smell coming from the incision site.  You have swelling, redness, or drainage at the incision site.  You have more swelling or pain at the port site or the surrounding area.  You have a fever that is not controlled with medicine.  This information is not intended to replace advice given to you by your health care provider. Make sure you discuss any questions you have with your health care provider. Document  Released: 04/12/2005 Document Revised: 09/18/2015 Document Reviewed: 12/18/2012 Elsevier Interactive Patient Education  2017 Elsevier Inc.  

## 2017-04-13 NOTE — Progress Notes (Signed)
[facility] OFFICE PROGRESS NOTE  [name], [place] [address]  DIAGNOSIS: Extensive stage (T2b, N2, M1a) small cell lung cancer presented with large left upper lobe lung mass and mediastinal lymphadenopathy as well as malignant left pleural effusion diagnosed in July 2017.  PRIOR THERAPY: 1) Systemic chemotherapy with cisplatin 60 MG/M2 on day 1 and etoposide at 120 MG/M2 on days 1, 2 and 3 with Neulasta support on day 4. She is status post 6 cycles. 2) prophylactic cranial irradiation under the care of Dr. Sondra Come completed on 05/18/2016. 3) palliative radiotherapy to the enlarging left upper lobe lung nodule under the care of Dr. Sondra Come. 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 5)Systemic chemotherapy with cisplatin 30 MG/M2 and irinotecan 65 MG/M2 on days 1 and 8 every 3 weeks. First dose 10/21/2016.Status post4cycles.  CURRENT THERAPY: Temodar 280 mg daily at bedtime X 5 days.  The patient started treatment on 03/18/2017. Status post 1 cycle.  INTERVAL HISTORY: Sharon [name] 61 y.o. female returns for routine follow-up visit accompanied by her friend.  The patient tolerated her first cycle of Temodar well overall with the exception of mild nausea.  The patient thinks that she missed 1 day of her treatment since she had 2 pills left over.  The patient denies fevers and chills.  Denies chest pain, shortness of breath, cough, hemoptysis.  Denies nausea, vomiting, constipation, diarrhea today.  Her appetite remains fair and she has lost more weight.  She notices ongoing facial numbness on her right side, but no weakness.  The patient is here for evaluation and repeat blood work.  MEDICAL HISTORY: Past Medical History:  Diagnosis Date  . Cancer of right breast ([place]) 2009  . CAP (community acquired pneumonia) 11/06/2015  . Chemotherapy induced neutropenia ([place]) 12/17/2016  . COPD (chronic obstructive  pulmonary disease) ([place])   . Elevated blood pressure   . Encounter for antineoplastic chemotherapy 01/05/2016  . History of radiation therapy 05/04/16-05/18/16   whole brain 25 Gy in 10 fractions  . History of radiation therapy 09/01/16 - 09/21/16   Left Lung treated to 35 Gy in 14 fractions  . Hoarseness of voice    "for the last 2 months" (11/06/2015)  . Hypertension   . Hypokalemia 12/17/2016  . Lung mass dx'd 10/2015  . Menopause   . Personal history of breast cancer 03/29/2016  . Pre-diabetes   . Small cell lung cancer ([place])   . Vitamin D deficiency     ALLERGIES:  is allergic to magnesium-containing compounds.  MEDICATIONS:  Current Outpatient Medications  Medication Sig Dispense Refill  . albuterol (PROVENTIL HFA;VENTOLIN HFA) 108 (90 Base) MCG/ACT inhaler Inhale 1 puff into the lungs every 6 (six) hours as needed for wheezing or shortness of breath.    . budesonide-formoterol (SYMBICORT) 160-4.5 MCG/ACT inhaler Inhale 2 puffs into the lungs 2 (two) times daily. 1 Inhaler 11  . ferrous sulfate 325 (65 FE) MG EC tablet Take 325 mg by mouth daily.    . fluticasone (FLONASE) 50 MCG/ACT nasal spray Place 1 spray into both nostrils daily.     . Ipratropium-Albuterol (COMBIVENT RESPIMAT) 20-100 MCG/ACT AERS respimat Inhale 1 puff into the lungs every 6 (six) hours.    . lidocaine-prilocaine (EMLA) cream Apply 1 application topically as needed. 30 g 2  . loperamide (IMODIUM) 2 MG capsule Take by mouth as needed for diarrhea or loose stools.    Marland Kitchen LORazepam (ATIVAN) 0.5  MG tablet 1 tablet po 30 minutes prior to radiation or MRI (Patient not taking: Reported on 02/25/2017) 30 tablet 0  . magnesium oxide (MAG-OX) 400 (241.3 Mg) MG tablet Take 1 tablet (400 mg total) by mouth 4 (four) times daily. 120 tablet 0  . meclizine (ANTIVERT) 12.5 MG tablet Take 1-2 tablets (12.5-25 mg total) by mouth 3 (three) times daily as needed for dizziness. (Patient not taking: Reported on 12/29/2016) 20 tablet 0  .  methylPREDNISolone (MEDROL) 4 MG tablet Take 6 tabs on day 1, 5 tabs on day 2, 4 tabs on day 3, 3 tabs on day 4, 2 tabs on day 5, 1 tab on day 6, and then stop 21 tablet 0  . montelukast (SINGULAIR) 10 MG tablet Take 10 mg by mouth at bedtime.   2  . ondansetron (ZOFRAN) 8 MG tablet Take 1 tablet (8 mg total) every 8 (eight) hours as needed by mouth for nausea or vomiting. 20 tablet 1  . oxyCODONE-acetaminophen (PERCOCET/ROXICET) 5-325 MG tablet Take 1 tablet by mouth every 4 (four) hours as needed for severe pain. 30 tablet 0  . polyethylene glycol powder (GLYCOLAX/MIRALAX) powder Take 1 Container by mouth daily.    . potassium chloride 20 MEQ/15ML (10%) SOLN Take 15 mLs (20 mEq total) by mouth 2 (two) times daily. 900 mL 0  . prochlorperazine (COMPAZINE) 10 MG tablet TAKE 1 TABLET BY MOUTH EVERY 6 HOURS AS NEEDED FOR NAUSEA AND VOMITING 90 tablet 0  . SANTYL ointment APP TOPICALLY UTD  0  . senna (SENOKOT) 8.6 MG TABS tablet Take 1 tablet (8.6 mg total) by mouth daily. (Patient not taking: Reported on 02/15/2017) 30 tablet 0  . sucralfate (CARAFATE) 1 GM/10ML suspension Take 10 mLs (1 g total) by mouth 4 (four) times daily -  with meals and at bedtime. (Patient not taking: Reported on 02/25/2017) 420 mL 0  . temozolomide (TEMODAR) 140 MG capsule Take 2 capsules (280 mg total) daily by mouth. Take for days 1-5 every 28 days. May take on empty stomach & at bedtime to decrease N&V. (Patient not taking: Reported on 03/29/2017) 10 capsule 2  . UNABLE TO FIND Take 10 mLs by mouth daily as needed. Med Name: CBD Oil     No current facility-administered medications for this visit.     SURGICAL HISTORY:  Past Surgical History:  Procedure Laterality Date  . BREAST BIOPSY Right 2009  . BREAST LUMPECTOMY Right 2009  . IR FLUORO GUIDE PORT INSERTION RIGHT  02/01/2017  . IR US GUIDE VASC ACCESS RIGHT  02/01/2017  . UTERINE FIBROID SURGERY  2000  . VAGINAL HYSTERECTOMY  2000  . VESICOVAGINAL FISTULA CLOSURE  W/ TAH  2000  . VIDEO BRONCHOSCOPY WITH ENDOBRONCHIAL ULTRASOUND N/A 11/10/2015   Procedure: VIDEO BRONCHOSCOPY WITH ENDOBRONCHIAL ULTRASOUND;  Surgeon: Javier Glazier, MD;  Location: [facility];  Service: Thoracic;  Laterality: N/A;    REVIEW OF SYSTEMS:   Review of Systems  Constitutional: Negative for appetite change, chills, fatigue, fever. Positive for weight loss. HENT:   Negative for mouth sores, nosebleeds, sore throat and trouble swallowing.   Eyes: Negative for eye problems and icterus.  Respiratory: Negative for cough, hemoptysis, shortness of breath and wheezing.   Cardiovascular: Negative for chest pain and leg swelling.  Gastrointestinal: Negative for abdominal pain, constipation, diarrhea, nausea and vomiting.  Genitourinary: Negative for bladder incontinence, difficulty urinating, dysuria, frequency and hematuria.   Musculoskeletal: Negative for back pain, gait problem, neck pain and neck  stiffness.  Skin: Negative for itching and rash.  Neurological: Negative for dizziness, extremity weakness, gait problem, headaches, light-headedness and seizures. Positive for numbness to the right side of her face. Hematological: Negative for adenopathy. Does not bruise/bleed easily.  Psychiatric/Behavioral: Negative for confusion, depression and sleep disturbance. The patient is not nervous/anxious.     PHYSICAL EXAMINATION:  Blood pressure 101/63, pulse (!) 116, temperature 98.2 F (36.8 C), temperature source Oral, resp. rate 18, height 5\' 6"  (1.676 m), weight 182 lb (82.6 kg), SpO2 98 %.  ECOG PERFORMANCE STATUS: 1 - Symptomatic but completely ambulatory  Physical Exam  Constitutional: Oriented to person, place, and time and well-developed, well-nourished, and in no distress. No distress.  HENT:  Head: Normocephalic and atraumatic.  Mouth/Throat: Oropharynx is clear and moist. No oropharyngeal exudate.  Eyes: Conjunctivae are normal. Right eye exhibits no discharge. Left eye  exhibits no discharge. No scleral icterus.  Neck: Normal range of motion. Neck supple.  Cardiovascular: Normal rate, regular rhythm, normal heart sounds and intact distal pulses.   Pulmonary/Chest: Effort normal and breath sounds normal. No respiratory distress. No wheezes. No rales.  Abdominal: Soft. Bowel sounds are normal. Exhibits no distension and no mass. There is no tenderness.  Musculoskeletal: Normal range of motion. Exhibits no edema.  Lymphadenopathy:    No cervical adenopathy.  Neurological: Alert and oriented to person, place, and time. Exhibits normal muscle tone. Gait normal. Coordination normal.  Skin: Skin is warm and dry. No rash noted. Not diaphoretic. No erythema. No pallor.  Psychiatric: Mood, memory and judgment normal.  Vitals reviewed.  LABORATORY DATA: Lab Results  Component Value Date   WBC 11.9 (H) 04/13/2017   HGB 10.8 (L) 04/13/2017   HCT 32.0 (L) 04/13/2017   MCV 103.2 (H) 04/13/2017   PLT 192 04/13/2017      Chemistry      Component Value Date/Time   NA 133 (L) 04/13/2017 1058   K 3.6 04/13/2017 1058   CL 98 (L) 01/09/2017 1829   CL 104 07/18/2012 1451   CO2 24 04/13/2017 1058   BUN 9.4 04/13/2017 1058   CREATININE 1.0 04/13/2017 1058      Component Value Date/Time   CALCIUM 10.0 04/13/2017 1058   ALKPHOS 483 (H) 04/13/2017 1058   AST 127 (H) 04/13/2017 1058   ALT 49 04/13/2017 1058   BILITOT 0.97 04/13/2017 1058       RADIOGRAPHIC STUDIES:  No results found.   ASSESSMENT/PLAN:  Primary small cell carcinoma of left lung ([place]) This is a very pleasant 61year old white female with extensive stage small cell lung cancer status post 6 cycles of systemic chemotherapy with cisplatin and etoposide followed by prophylactic cranial irradiation followed by palliative radiotherapy to a left upper lobe pulmonary nodule followed by stereotactic radiotherapy to solitary brain metastasis.This was followed by disease progression with new bulky left  supraclavicular lymphadenopathy as well as new enlarged mediastinal left hilar lymph nodes. The patient was started on systemic chemotherapy with cisplatin 30 MG/M2 and irinotecan 65 MG/M2 on days 1 and 8 every 3 weeks status post4cycles. Tolerated her treatment well except for neutropenia,thrombocytopenia,intermittentnauseaand vomiting, and diarrhea.  Unfortunately, the patient had progression of disease. The patient is now on Temodar 280 mg p.o. days 1 through 5 of a 28-day cycle.  Tolerated her first cycle well overall with exception of mild fatigue, and intermittent nausea.  Labs are reviewed and are overall stable.  She has mild elevation in her AST which we will watch.  The patient was seen with Dr. Julien Nordmann.  Recommended the patient proceed with starting cycle #2 of her Temodar as scheduled on 04/15/2017.  The patient will have a restaging CT scan of the chest, abdomen, pelvis prior to her next visit.  We will see her back in approximately 4 weeks to review her restaging CT scan results and for evaluation prior to cycle #3 of her treatment.  For her weight loss, we discussed options including Megace, Marinol, Remeron, and a Medrol Dosepak.  Risks and benefits of each were discussed. The patient would like to try a Medrol Dosepak and this was sent to her local pharmacy.  The patient has facial numbness.  There are no neurological deficits noted on exam.  The patient could have some numbness related to her metastatic brain disease.  We will continue to monitor this.  The patient was advised to call immediately if she has any concerning symptoms in the interval. The patient voices understanding of current disease status and treatment options and is in agreement with the current care plan. All questions were answered. The patient knows to call the clinic with any problems, questions or concerns. We can certainly see the patient much sooner if necessary.  Orders Placed This Encounter   Procedures  . CT CHEST W CONTRAST    Standing Status:   Future    Standing Expiration Date:   04/13/2018    Order Specific Question:   If indicated for the ordered procedure, I authorize the administration of contrast media per Radiology protocol    Answer:   Yes    Order Specific Question:   Preferred imaging location?    Answer:   [facility]    Order Specific Question:   Radiology Contrast Protocol - do NOT remove file path    Answer:   [email or web address]    Order Specific Question:   Reason for Exam additional comments    Answer:   extensive stage small cell lung cancer.    Order Specific Question:   Is patient pregnant?    Answer:   No  . CT ABDOMEN PELVIS W CONTRAST    Standing Status:   Future    Standing Expiration Date:   04/13/2018    Order Specific Question:   If indicated for the ordered procedure, I authorize the administration of contrast media per Radiology protocol    Answer:   Yes    Order Specific Question:   Preferred imaging location?    Answer:   [facility]    Order Specific Question:   Radiology Contrast Protocol - do NOT remove file path    Answer:   [email or web address]    Order Specific Question:   Reason for Exam additional comments    Answer:   extensive stage small cell lung cancer.    Order Specific Question:   Is patient pregnant?    Answer:   No  . CBC with Differential/Platelet    Standing Status:   Future    Standing Expiration Date:   04/13/2018  . Comprehensive metabolic panel    Standing Status:   Future    Standing Expiration Date:   04/13/2018     Mikey Bussing, DNP, AGPCNP-BC, AOCNP 04/14/17  ADDENDUM: Hematology/Oncology Attending: I had a face-to-face encounter with the patient.  I recommended her care plan.  She came to the clinic today for follow-up visit accompanied by friend.  This is a very pleasant 37 years old white female with extensive stage  small cell lung cancer status post several chemotherapy regimens with evidence of disease progression.  She was a started recently on treatment with Temodar status post 1 cycle and tolerated the cycle fairly well.  She missed 1 day of the treatment. She is feeling fine today with no specific complaints except for mild fatigue.  She denied having any chest pain, shortness breath, cough or hemoptysis. I recommended for the patient to proceed with cycle #2 as a scheduled. We will see her back for follow-up visit in 4 weeks for evaluation with repeat CT scan of the chest, abdomen and pelvis for restaging of her disease. The patient was advised to call immediately if she has any concerning symptoms in the interval.  Disclaimer: This note was dictated with voice recognition software. Similar sounding words can inadvertently be transcribed and may be missed upon review. [name], MD 04/16/17

## 2017-04-14 ENCOUNTER — Encounter: Payer: Self-pay | Admitting: Oncology

## 2017-04-14 ENCOUNTER — Telehealth: Payer: Self-pay | Admitting: Internal Medicine

## 2017-04-14 NOTE — Assessment & Plan Note (Signed)
This is a very pleasant 61year old white female with extensive stage small cell lung cancer status post 6 cycles of systemic chemotherapy with cisplatin and etoposide followed by prophylactic cranial irradiation followed by palliative radiotherapy to a left upper lobe pulmonary nodule followed by stereotactic radiotherapy to solitary brain metastasis.This was followed by disease progression with new bulky left supraclavicular lymphadenopathy as well as new enlarged mediastinal left hilar lymph nodes. The patient was started on systemic chemotherapy with cisplatin 30 MG/M2 and irinotecan 65 MG/M2 on days 1 and 8 every 3 weeks status post4cycles. Tolerated her treatment well except for neutropenia,thrombocytopenia,intermittentnauseaand vomiting, and diarrhea.  Unfortunately, the patient had progression of disease. The patient is now on Temodar 280 mg p.o. days 1 through 5 of a 28-day cycle.  Tolerated her first cycle well overall with exception of mild fatigue, and intermittent nausea.  Labs are reviewed and are overall stable.  She has mild elevation in her AST which we will watch.  The patient was seen with Dr. Julien Nordmann.  Recommended the patient proceed with starting cycle #2 of her Temodar as scheduled on 04/15/2017.  The patient will have a restaging CT scan of the chest, abdomen, pelvis prior to her next visit.  We will see her back in approximately 4 weeks to review her restaging CT scan results and for evaluation prior to cycle #3 of her treatment.  For her weight loss, we discussed options including Megace, Marinol, Remeron, and a Medrol Dosepak.  Risks and benefits of each were discussed. The patient would like to try a Medrol Dosepak and this was sent to her local pharmacy.  The patient has facial numbness.  There are no neurological deficits noted on exam.  The patient could have some numbness related to her metastatic brain disease.  We will continue to monitor this.  The patient was  advised to call immediately if she has any concerning symptoms in the interval. The patient voices understanding of current disease status and treatment options and is in agreement with the current care plan. All questions were answered. The patient knows to call the clinic with any problems, questions or concerns. We can certainly see the patient much sooner if necessary.

## 2017-04-14 NOTE — Telephone Encounter (Signed)
Left message for patient regarding upcoming January appointments.

## 2017-04-20 ENCOUNTER — Other Ambulatory Visit: Payer: Self-pay | Admitting: Radiation Therapy

## 2017-04-20 DIAGNOSIS — C7949 Secondary malignant neoplasm of other parts of nervous system: Principal | ICD-10-CM

## 2017-04-20 DIAGNOSIS — C7931 Secondary malignant neoplasm of brain: Secondary | ICD-10-CM

## 2017-04-24 ENCOUNTER — Inpatient Hospital Stay (HOSPITAL_COMMUNITY)
Admission: EM | Admit: 2017-04-24 | Discharge: 2017-05-04 | DRG: 391 | Disposition: A | Discharge status: Skilled Nursing Facility | Payer: 59 | Attending: Internal Medicine | Admitting: Internal Medicine

## 2017-04-24 ENCOUNTER — Emergency Department (HOSPITAL_COMMUNITY): Payer: 59

## 2017-04-24 ENCOUNTER — Encounter (HOSPITAL_COMMUNITY): Payer: Self-pay | Admitting: Emergency Medicine

## 2017-04-24 DIAGNOSIS — Z9221 Personal history of antineoplastic chemotherapy: Secondary | ICD-10-CM

## 2017-04-24 DIAGNOSIS — R740 Nonspecific elevation of levels of transaminase and lactic acid dehydrogenase [LDH]: Secondary | ICD-10-CM | POA: Diagnosis present

## 2017-04-24 DIAGNOSIS — J449 Chronic obstructive pulmonary disease, unspecified: Secondary | ICD-10-CM | POA: Diagnosis present

## 2017-04-24 DIAGNOSIS — J189 Pneumonia, unspecified organism: Secondary | ICD-10-CM | POA: Clinically undetermined

## 2017-04-24 DIAGNOSIS — Z87891 Personal history of nicotine dependence: Secondary | ICD-10-CM

## 2017-04-24 DIAGNOSIS — C7931 Secondary malignant neoplasm of brain: Secondary | ICD-10-CM | POA: Diagnosis present

## 2017-04-24 DIAGNOSIS — K651 Peritoneal abscess: Secondary | ICD-10-CM

## 2017-04-24 DIAGNOSIS — R7401 Elevation of levels of liver transaminase levels: Secondary | ICD-10-CM | POA: Diagnosis present

## 2017-04-24 DIAGNOSIS — G8929 Other chronic pain: Secondary | ICD-10-CM | POA: Diagnosis present

## 2017-04-24 DIAGNOSIS — C7972 Secondary malignant neoplasm of left adrenal gland: Secondary | ICD-10-CM | POA: Diagnosis present

## 2017-04-24 DIAGNOSIS — Z7951 Long term (current) use of inhaled steroids: Secondary | ICD-10-CM

## 2017-04-24 DIAGNOSIS — K5732 Diverticulitis of large intestine without perforation or abscess without bleeding: Secondary | ICD-10-CM

## 2017-04-24 DIAGNOSIS — J9610 Chronic respiratory failure, unspecified whether with hypoxia or hypercapnia: Secondary | ICD-10-CM | POA: Diagnosis present

## 2017-04-24 DIAGNOSIS — R490 Dysphonia: Secondary | ICD-10-CM | POA: Diagnosis present

## 2017-04-24 DIAGNOSIS — K632 Fistula of intestine: Secondary | ICD-10-CM | POA: Diagnosis present

## 2017-04-24 DIAGNOSIS — Z9981 Dependence on supplemental oxygen: Secondary | ICD-10-CM

## 2017-04-24 DIAGNOSIS — Z5112 Encounter for antineoplastic immunotherapy: Secondary | ICD-10-CM

## 2017-04-24 DIAGNOSIS — Z66 Do not resuscitate: Secondary | ICD-10-CM | POA: Diagnosis present

## 2017-04-24 DIAGNOSIS — A419 Sepsis, unspecified organism: Secondary | ICD-10-CM

## 2017-04-24 DIAGNOSIS — Z923 Personal history of irradiation: Secondary | ICD-10-CM

## 2017-04-24 DIAGNOSIS — K5720 Diverticulitis of large intestine with perforation and abscess without bleeding: Secondary | ICD-10-CM | POA: Diagnosis present

## 2017-04-24 DIAGNOSIS — C3492 Malignant neoplasm of unspecified part of left bronchus or lung: Secondary | ICD-10-CM | POA: Diagnosis present

## 2017-04-24 DIAGNOSIS — Z853 Personal history of malignant neoplasm of breast: Secondary | ICD-10-CM | POA: Diagnosis not present

## 2017-04-24 DIAGNOSIS — Z515 Encounter for palliative care: Secondary | ICD-10-CM

## 2017-04-24 DIAGNOSIS — J440 Chronic obstructive pulmonary disease with acute lower respiratory infection: Secondary | ICD-10-CM | POA: Diagnosis present

## 2017-04-24 DIAGNOSIS — D638 Anemia in other chronic diseases classified elsewhere: Secondary | ICD-10-CM | POA: Diagnosis present

## 2017-04-24 DIAGNOSIS — R1032 Left lower quadrant pain: Secondary | ICD-10-CM

## 2017-04-24 DIAGNOSIS — C787 Secondary malignant neoplasm of liver and intrahepatic bile duct: Secondary | ICD-10-CM | POA: Diagnosis present

## 2017-04-24 DIAGNOSIS — C3412 Malignant neoplasm of upper lobe, left bronchus or lung: Secondary | ICD-10-CM | POA: Diagnosis not present

## 2017-04-24 DIAGNOSIS — Z79899 Other long term (current) drug therapy: Secondary | ICD-10-CM

## 2017-04-24 DIAGNOSIS — I10 Essential (primary) hypertension: Secondary | ICD-10-CM | POA: Diagnosis present

## 2017-04-24 DIAGNOSIS — Z7189 Other specified counseling: Secondary | ICD-10-CM | POA: Diagnosis not present

## 2017-04-24 DIAGNOSIS — L8990 Pressure ulcer of unspecified site, unspecified stage: Secondary | ICD-10-CM

## 2017-04-24 LAB — CBC WITH DIFFERENTIAL/PLATELET
BASOS PCT: 0 %
Basophils Absolute: 0 10*3/uL (ref 0.0–0.1)
EOS ABS: 0 10*3/uL (ref 0.0–0.7)
EOS PCT: 0 %
HCT: 29.2 % — ABNORMAL LOW (ref 36.0–46.0)
HEMOGLOBIN: 10.2 g/dL — AB (ref 12.0–15.0)
LYMPHS PCT: 8 %
Lymphs Abs: 1.6 10*3/uL (ref 0.7–4.0)
MCH: 35.8 pg — AB (ref 26.0–34.0)
MCHC: 34.9 g/dL (ref 30.0–36.0)
MCV: 102.5 fL — AB (ref 78.0–100.0)
MONO ABS: 0.6 10*3/uL (ref 0.1–1.0)
Monocytes Relative: 3 %
Neutro Abs: 17.6 10*3/uL — ABNORMAL HIGH (ref 1.7–7.7)
Neutrophils Relative %: 89 %
PLATELETS: 176 10*3/uL (ref 150–400)
RBC: 2.85 MIL/uL — AB (ref 3.87–5.11)
RDW: 17.6 % — ABNORMAL HIGH (ref 11.5–15.5)
WBC: 19.8 10*3/uL — AB (ref 4.0–10.5)

## 2017-04-24 LAB — URINALYSIS, ROUTINE W REFLEX MICROSCOPIC
Glucose, UA: NEGATIVE mg/dL
Hgb urine dipstick: NEGATIVE
KETONES UR: 5 mg/dL — AB
Leukocytes, UA: NEGATIVE
Nitrite: NEGATIVE
PROTEIN: 30 mg/dL — AB
SQUAMOUS EPITHELIAL / LPF: NONE SEEN
Specific Gravity, Urine: 1.02 (ref 1.005–1.030)
pH: 5 (ref 5.0–8.0)

## 2017-04-24 LAB — COMPREHENSIVE METABOLIC PANEL
ALK PHOS: 559 U/L — AB (ref 38–126)
ALT: 46 U/L (ref 14–54)
AST: 290 U/L — AB (ref 15–41)
Albumin: 2.7 g/dL — ABNORMAL LOW (ref 3.5–5.0)
Anion gap: 14 (ref 5–15)
BUN: 17 mg/dL (ref 6–20)
CALCIUM: 10.2 mg/dL (ref 8.9–10.3)
CHLORIDE: 93 mmol/L — AB (ref 101–111)
CO2: 25 mmol/L (ref 22–32)
CREATININE: 0.91 mg/dL (ref 0.44–1.00)
GFR calc non Af Amer: >60 mL/min (ref >60)
Glucose, Bld: 131 mg/dL — ABNORMAL HIGH (ref 65–99)
Potassium: 3.6 mmol/L (ref 3.5–5.1)
SODIUM: 132 mmol/L — AB (ref 135–145)
Total Bilirubin: 4.7 mg/dL — ABNORMAL HIGH (ref 0.3–1.2)
Total Protein: 6.8 g/dL (ref 6.5–8.1)

## 2017-04-24 LAB — I-STAT CG4 LACTIC ACID, ED: Lactic Acid, Venous: 1.27 mmol/L (ref 0.5–1.9)

## 2017-04-24 LAB — I-STAT TROPONIN, ED: Troponin i, poc: 0 ng/mL (ref 0.00–0.08)

## 2017-04-24 LAB — LIPASE, BLOOD: Lipase: 20 U/L (ref 11–51)

## 2017-04-24 MED ORDER — POTASSIUM CHLORIDE 20 MEQ/15ML (10%) PO SOLN
20.0000 meq | Freq: Two times a day (BID) | ORAL | Status: DC
  Administered 2017-04-25 – 2017-05-01 (×8): 20 meq via ORAL
  Filled 2017-04-24 (×20): qty 15

## 2017-04-24 MED ORDER — MONTELUKAST SODIUM 10 MG PO TABS
10.0000 mg | ORAL_TABLET | Freq: Every day | ORAL | Status: DC
  Administered 2017-04-25 – 2017-05-03 (×9): 10 mg via ORAL
  Filled 2017-04-24 (×9): qty 1

## 2017-04-24 MED ORDER — MAGNESIUM OXIDE 400 (241.3 MG) MG PO TABS
400.0000 mg | ORAL_TABLET | Freq: Four times a day (QID) | ORAL | Status: DC
  Administered 2017-04-26 – 2017-05-02 (×20): 400 mg via ORAL
  Filled 2017-04-24 (×29): qty 1

## 2017-04-24 MED ORDER — IOPAMIDOL (ISOVUE-370) INJECTION 76%
INTRAVENOUS | Status: AC
  Administered 2017-04-24: 100 mL via INTRAVENOUS
  Filled 2017-04-24: qty 100

## 2017-04-24 MED ORDER — PIPERACILLIN-TAZOBACTAM 3.375 G IVPB
3.3750 g | Freq: Three times a day (TID) | INTRAVENOUS | Status: DC
Start: 2017-04-24 — End: 2017-05-03
  Administered 2017-04-25 – 2017-05-03 (×27): 3.375 g via INTRAVENOUS
  Filled 2017-04-24 (×26): qty 50

## 2017-04-24 MED ORDER — MECLIZINE HCL 25 MG PO TABS
12.5000 mg | ORAL_TABLET | Freq: Three times a day (TID) | ORAL | Status: DC | PRN
  Administered 2017-04-25: 25 mg via ORAL
  Filled 2017-04-24: qty 1

## 2017-04-24 MED ORDER — ONDANSETRON HCL 4 MG PO TABS: 8.0000 mg | ORAL_TABLET | Freq: Three times a day (TID) | ORAL | Status: DC | PRN

## 2017-04-24 MED ORDER — ONDANSETRON HCL 4 MG/2ML IJ SOLN
4.0000 mg | Freq: Four times a day (QID) | INTRAMUSCULAR | Status: DC | PRN
  Administered 2017-04-25 – 2017-05-03 (×3): 4 mg via INTRAVENOUS
  Filled 2017-04-24 (×3): qty 2

## 2017-04-24 MED ORDER — IPRATROPIUM-ALBUTEROL 0.5-2.5 (3) MG/3ML IN SOLN
3.0000 mL | Freq: Four times a day (QID) | RESPIRATORY_TRACT | Status: DC
  Administered 2017-04-25 (×2): 3 mL via RESPIRATORY_TRACT
  Filled 2017-04-24 (×3): qty 3

## 2017-04-24 MED ORDER — FLUTICASONE PROPIONATE 50 MCG/ACT NA SUSP
1.0000 | Freq: Every day | NASAL | Status: DC
  Administered 2017-04-25 – 2017-05-01 (×6): 1 via NASAL
  Filled 2017-04-24: qty 16

## 2017-04-24 MED ORDER — VANCOMYCIN HCL IN DEXTROSE 1-5 GM/200ML-% IV SOLN
1000.0000 mg | INTRAVENOUS | Status: AC
  Administered 2017-04-25 – 2017-04-26 (×2): 1000 mg via INTRAVENOUS
  Filled 2017-04-24 (×3): qty 200

## 2017-04-24 MED ORDER — ALBUTEROL SULFATE (2.5 MG/3ML) 0.083% IN NEBU
2.5000 mg | INHALATION_SOLUTION | Freq: Four times a day (QID) | RESPIRATORY_TRACT | Status: DC | PRN
  Administered 2017-04-27: 2.5 mg via RESPIRATORY_TRACT
  Filled 2017-04-24: qty 3

## 2017-04-24 MED ORDER — ACETAMINOPHEN 325 MG PO TABS
650.0000 mg | ORAL_TABLET | Freq: Four times a day (QID) | ORAL | Status: DC | PRN
Start: 2017-04-24 — End: 2017-05-04
  Administered 2017-04-26 – 2017-04-29 (×3): 650 mg via ORAL
  Filled 2017-04-24 (×3): qty 2

## 2017-04-24 MED ORDER — ONDANSETRON HCL 4 MG PO TABS: 4.0000 mg | ORAL_TABLET | Freq: Four times a day (QID) | ORAL | Status: DC | PRN

## 2017-04-24 MED ORDER — FLUTICASONE FUROATE-VILANTEROL 200-25 MCG/INH IN AEPB
1.0000 | INHALATION_SPRAY | Freq: Every day | RESPIRATORY_TRACT | Status: DC
  Administered 2017-04-25 – 2017-05-03 (×7): 1 via RESPIRATORY_TRACT
  Filled 2017-04-24: qty 28

## 2017-04-24 MED ORDER — MORPHINE SULFATE (PF) 4 MG/ML IV SOLN
2.0000 mg | INTRAVENOUS | Status: DC | PRN
Start: 2017-04-24 — End: 2017-04-29
  Administered 2017-04-25 – 2017-04-26 (×3): 4 mg via INTRAVENOUS
  Administered 2017-04-26: 2 mg via INTRAVENOUS
  Administered 2017-04-27: 4 mg via INTRAVENOUS
  Administered 2017-04-27 – 2017-04-28 (×4): 2 mg via INTRAVENOUS
  Administered 2017-04-28: 4 mg via INTRAVENOUS
  Administered 2017-04-29 (×2): 2 mg via INTRAVENOUS
  Filled 2017-04-24 (×13): qty 1

## 2017-04-24 MED ORDER — SODIUM CHLORIDE 0.9 % IV BOLUS (SEPSIS)
1000.0000 mL | Freq: Once | INTRAVENOUS | Status: AC
  Administered 2017-04-24: 1000 mL via INTRAVENOUS

## 2017-04-24 MED ORDER — IPRATROPIUM-ALBUTEROL 0.5-2.5 (3) MG/3ML IN SOLN
3.0000 mL | Freq: Four times a day (QID) | RESPIRATORY_TRACT | Status: DC
  Administered 2017-04-24: 3 mL via RESPIRATORY_TRACT
  Filled 2017-04-24: qty 3

## 2017-04-24 MED ORDER — SENNA 8.6 MG PO TABS
1.0000 | ORAL_TABLET | Freq: Every day | ORAL | Status: DC
  Administered 2017-04-26 – 2017-04-29 (×3): 8.6 mg via ORAL
  Filled 2017-04-24 (×5): qty 1

## 2017-04-24 MED ORDER — MORPHINE SULFATE (PF) 4 MG/ML IV SOLN
4.0000 mg | Freq: Once | INTRAVENOUS | Status: AC
  Administered 2017-04-24: 4 mg via INTRAVENOUS
  Filled 2017-04-24: qty 1

## 2017-04-24 MED ORDER — IPRATROPIUM-ALBUTEROL 20-100 MCG/ACT IN AERS: 1.0000 | INHALATION_SPRAY | Freq: Four times a day (QID) | RESPIRATORY_TRACT | Status: DC

## 2017-04-24 MED ORDER — VANCOMYCIN HCL 10 G IV SOLR
1500.0000 mg | Freq: Once | INTRAVENOUS | Status: AC
  Administered 2017-04-24: 1500 mg via INTRAVENOUS
  Filled 2017-04-24: qty 1500

## 2017-04-24 MED ORDER — SODIUM CHLORIDE 0.9 % IV SOLN
INTRAVENOUS | Status: DC
  Administered 2017-04-25: 100 mL/h via INTRAVENOUS
  Administered 2017-04-25 – 2017-04-28 (×3): via INTRAVENOUS
  Administered 2017-04-28 – 2017-04-29 (×2): 50 mL/h via INTRAVENOUS
  Administered 2017-04-30: 20:00:00 via INTRAVENOUS
  Administered 2017-04-30: 1000 mL via INTRAVENOUS
  Administered 2017-05-02 – 2017-05-04 (×3): via INTRAVENOUS

## 2017-04-24 MED ORDER — FERROUS SULFATE 325 (65 FE) MG PO TABS
325.0000 mg | ORAL_TABLET | Freq: Every day | ORAL | Status: DC
  Administered 2017-04-26 – 2017-05-02 (×6): 325 mg via ORAL
  Filled 2017-04-24 (×9): qty 1

## 2017-04-24 MED ORDER — ACETAMINOPHEN 650 MG RE SUPP: 650.0000 mg | Freq: Four times a day (QID) | RECTAL | Status: DC | PRN

## 2017-04-24 MED ORDER — PIPERACILLIN-TAZOBACTAM 3.375 G IVPB 30 MIN
3.3750 g | Freq: Once | INTRAVENOUS | Status: AC
  Administered 2017-04-24: 3.375 g via INTRAVENOUS
  Filled 2017-04-24: qty 50

## 2017-04-24 NOTE — ED Notes (Signed)
Pt tried using female urinal but was unsuccessful pt stated she has been able to urinate for the past 3 days.

## 2017-04-24 NOTE — ED Notes (Signed)
ED Provider at bedside. 

## 2017-04-24 NOTE — Progress Notes (Signed)
Pharmacy Antibiotic Note  Sharon [name] is a 61 y.o. female admitted on 04/24/2017 with ABD fistula and postobstructive HCAP.  PMH significant for SCLC Patient received Vancomycin 1500mg  IV x 1 dose and Zosyn 3.375gm IV x 1 dose in the ED.  Upon admission, Pharmacy has been consulted to continue Vancomycin and Zosyn dosing.  Plan:  Vancomycin 1gm IV q24h (for estimated AUC 404.5)  Zosyn 3.375gm IV q8h (each dose infused over 4 hrs)  Follow daily SCr  Follow culture results and sensitivities    Temp (24hrs), Avg:98.4 F (36.9 C), Min:98.4 F (36.9 C), Max:98.4 F (36.9 C)  Recent Labs  Lab 04/24/17 1544 04/24/17 1754  WBC 19.8*  --   CREATININE 0.91  --   LATICACIDVEN  --  1.27    Estimated Creatinine Clearance: 71.2 mL/min (by C-G formula based on SCr of 0.91 mg/dL).    No Known Allergies  Antimicrobials this admission: 12/30 Vanc >>   12/30 Zosyn >>    Dose adjustments this admission:    Microbiology results: 12/30 BCx: sent 12/30 UCx: sent   Thank you for allowing pharmacy to be a part of this patient's care.  Everette Rank, PharmD 04/24/2017 8:17 PM

## 2017-04-24 NOTE — ED Notes (Signed)
Bed: DA37 Expected date:  Expected time:  Means of arrival:  Comments: Hold for tr 1

## 2017-04-24 NOTE — ED Provider Notes (Signed)
[facility] DEPT Provider Note   CSN: [id number] Arrival date & time: 04/24/17  1432     History   Chief Complaint Chief Complaint  Patient presents with  . Abdominal Pain    HPI Sharon [name] is a 61 y.o. female.  HPI   50 AM began to have abdominal pain, when abdominal pain more severe and when lays down feels short of breath.  Abdominal pain, sharp, dull and cramping pain.  Hasn't had bowel movements.  Tried stool softener. Yesterday had hard BM. Today had to disimpact self to have BM, small, hard stool.  Passing flatus.  No nausea or vomiting.  Maybe has had fever, felt warm, started day before yesterday, hasn't checked temperature.  Cough has been present for months, unchanged.  Voice hoarseness worse.   Chest pain has been present for 3 days, difficult to describe, same as abdominal pain-difficult to describe, present retrosternal superior chest.  Nothing makes chest pain better or worse, thought maybe inhaler would help but it didn't. No hx of cp or abdominal pain like this before.  Just finished second round of immunotherapy on xmas, doing that from home.  Was feeling short of breath earlier today but not now, thinks inhaler helped with dyspnea.   Past Medical History:  Diagnosis Date  . Cancer of right breast ([place]) 2009  . CAP (community acquired pneumonia) 11/06/2015  . Chemotherapy induced neutropenia ([place]) 12/17/2016  . COPD (chronic obstructive pulmonary disease) ([place])   . Elevated blood pressure   . Encounter for antineoplastic chemotherapy 01/05/2016  . History of radiation therapy 05/04/16-05/18/16   whole brain 25 Gy in 10 fractions  . History of radiation therapy 09/01/16 - 09/21/16   Left Lung treated to 35 Gy in 14 fractions  . Hoarseness of voice    "for the last 2 months" (11/06/2015)  . Hypertension   . Hypokalemia 12/17/2016  . Lung mass dx'd 10/2015  . Menopause   . Personal history of breast cancer 03/29/2016  . Pre-diabetes   .  Small cell lung cancer ([place])   . Vitamin D deficiency     Patient Active Problem List   Diagnosis Date Noted  . Goals of care, counseling/discussion 03/11/2017  . Hypomagnesemia 01/27/2017  . Chemotherapy induced neutropenia ([place]) 12/17/2016  . Hypokalemia 12/17/2016  . Partial small bowel obstruction ([place]) 10/15/2016  . Brain tumor ([place])   . Seizure ([place]) 09/28/2016  . Brain metastasis ([place]) 09/27/2016  . Hemoptysis 08/17/2016  . Personal history of breast cancer 03/29/2016  . Encounter for antineoplastic chemotherapy 01/05/2016  . Nausea without vomiting 12/15/2015  . 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 Mediastinal adenopathy   . Breast cancer ([place]) 07/18/2012  . COPD, severe ([place]) 08/18/2011  . Smoking 08/18/2011    Past Surgical History:  Procedure Laterality Date  . BREAST BIOPSY Right 2009  . BREAST LUMPECTOMY Right 2009  . IR FLUORO GUIDE PORT INSERTION RIGHT  02/01/2017  . IR US GUIDE VASC ACCESS RIGHT  02/01/2017  . UTERINE FIBROID SURGERY  2000  . VAGINAL HYSTERECTOMY  2000  . VESICOVAGINAL FISTULA CLOSURE W/ TAH  2000  . VIDEO BRONCHOSCOPY WITH ENDOBRONCHIAL ULTRASOUND N/A 11/10/2015   Procedure: VIDEO BRONCHOSCOPY WITH ENDOBRONCHIAL ULTRASOUND;  Surgeon: Javier Glazier, MD;  Location: [facility];  Service: Thoracic;  Laterality: N/A;    OB History    No data available       Home Medications    Prior  to Admission medications   Medication Sig Start Date End Date Taking? Authorizing Provider  albuterol (PROVENTIL HFA;VENTOLIN HFA) 108 (90 Base) MCG/ACT inhaler Inhale 1 puff into the lungs every 6 (six) hours as needed for wheezing or shortness of breath.   Yes [provider]  budesonide-formoterol (SYMBICORT) 160-4.5 MCG/ACT inhaler Inhale 2 puffs into the lungs 2 (two) times daily. 08/17/16  Yes Javier Glazier, MD  ferrous sulfate 325 (65 FE) MG EC tablet Take 325 mg by mouth daily.   Yes [provider]    fluticasone (FLONASE) 50 MCG/ACT nasal spray Place 1 spray into both nostrils daily.  08/20/16  Yes [provider]  Ipratropium-Albuterol (COMBIVENT RESPIMAT) 20-100 MCG/ACT AERS respimat Inhale 1 puff into the lungs every 6 (six) hours.   Yes [provider]  loperamide (IMODIUM) 2 MG capsule Take by mouth as needed for diarrhea or loose stools.   Yes [provider]  LORazepam (ATIVAN) 0.5 MG tablet 1 tablet po 30 minutes prior to radiation or MRI 09/28/16  Yes Hayden Pedro, PA-C  magnesium oxide (MAG-OX) 400 (241.3 Mg) MG tablet Take 1 tablet (400 mg total) by mouth 4 (four) times daily. 01/26/17  Yes Curcio, Roselie Awkward, NP  meclizine (ANTIVERT) 12.5 MG tablet Take 1-2 tablets (12.5-25 mg total) by mouth 3 (three) times daily as needed for dizziness. 06/05/16  Yes Virgel Manifold, MD  methylPREDNISolone (MEDROL) 4 MG tablet Take 6 tabs on day 1, 5 tabs on day 2, 4 tabs on day 3, 3 tabs on day 4, 2 tabs on day 5, 1 tab on day 6, and then stop 04/13/17  Yes Curcio, Roselie Awkward, NP  montelukast (SINGULAIR) 10 MG tablet Take 10 mg by mouth at bedtime.  09/06/16  Yes [provider]  ondansetron (ZOFRAN) 8 MG tablet Take 1 tablet (8 mg total) every 8 (eight) hours as needed by mouth for nausea or vomiting. 03/11/17  Yes Curcio, Roselie Awkward, NP  polyethylene glycol powder (GLYCOLAX/MIRALAX) powder Take 1 Container by mouth daily.   Yes [provider]  potassium chloride 20 MEQ/15ML (10%) SOLN Take 15 mLs (20 mEq total) by mouth 2 (two) times daily. 02/25/17  Yes Curcio, Roselie Awkward, NP  prochlorperazine (COMPAZINE) 10 MG tablet TAKE 1 TABLET BY MOUTH EVERY 6 HOURS AS NEEDED FOR NAUSEA AND VOMITING 02/11/17  Yes Curcio, Roselie Awkward, NP  SANTYL ointment APP TOPICALLY UTD 12/08/16  Yes [provider]  senna (SENOKOT) 8.6 MG TABS tablet Take 1 tablet (8.6 mg total) by mouth daily. 10/17/16  Yes Patrecia Pour, MD  sucralfate (CARAFATE) 1 GM/10ML suspension Take  10 mLs (1 g total) by mouth 4 (four) times daily -  with meals and at bedtime. 01/19/17  Yes Gery Pray, MD  temozolomide (TEMODAR) 140 MG capsule Take 2 capsules (280 mg total) daily by mouth. Take for days 1-5 every 28 days. May take on empty stomach & at bedtime to decrease N&V. 03/15/17  Yes Curt Bears, MD  UNABLE TO FIND Take 10 mLs by mouth daily as needed. Med Name: CBD Oil   Yes [provider]  lidocaine-prilocaine (EMLA) cream Apply 1 application topically as needed. Patient not taking: Reported on 04/24/2017 01/26/17   Maryanna Shape, NP  oxyCODONE-acetaminophen (PERCOCET/ROXICET) 5-325 MG tablet Take 1 tablet by mouth every 4 (four) hours as needed for severe pain. Patient not taking: Reported on 04/24/2017 01/25/17   Gery Pray, MD    Family History Family History  Adopted: Yes  Problem Relation Age of Onset  . Cancer Neg Hx     Social History Social History   Tobacco Use  . Smoking status: Former Smoker    Packs/day: 0.10    Years: 46.00    Pack years: 4.60    Types: Cigarettes    Last attempt to quit: 11/11/2015    Years since quitting: 1.4  . Smokeless tobacco: Never Used  . Tobacco comment: Peak rate of 2ppd  Substance Use Topics  . Alcohol use: No  . Drug use: Yes    Types: Marijuana    Comment: 11/06/2015 "1-2 times/week when I do smoke; none lately"     Allergies   Patient has no known allergies.   Review of Systems Review of Systems  Constitutional: Positive for appetite change (over the last month), fatigue and fever (subjective).  HENT: Positive for rhinorrhea. Negative for sore throat.   Eyes: Negative for visual disturbance.  Respiratory: Positive for cough and shortness of breath.   Cardiovascular: Positive for chest pain.  Gastrointestinal: Positive for abdominal pain and constipation. Negative for nausea and vomiting.  Genitourinary: Positive for decreased urine volume. Negative for difficulty urinating and dysuria.    Musculoskeletal: Negative for back pain and neck pain.  Skin: Negative for rash.  Neurological: Positive for light-headedness. Negative for syncope and headaches.     Physical Exam Updated Vital Signs BP 117/61 (BP Location: Left Arm)   Pulse (!) 117   Temp 98.4 F (36.9 C) (Oral)   Resp 20   SpO2 96%   Physical Exam  Constitutional: She is oriented to person, place, and time. She appears well-developed and well-nourished. No distress.  HENT:  Head: Normocephalic and atraumatic.  Eyes: Conjunctivae and EOM are normal.  Neck: Normal range of motion.  Cardiovascular: Regular rhythm, normal heart sounds and intact distal pulses. Tachycardia present. Exam reveals no gallop and no friction rub.  No murmur heard. Pulmonary/Chest: Effort normal. No respiratory distress. She has no wheezes. She has rhonchi (left sided). She has no rales.  Abdominal: Soft. She exhibits distension and mass (epigastric). There is hepatomegaly. There is generalized tenderness (worse right sided and epigastric) and tenderness in the right upper quadrant and epigastric area. There is no guarding.  Musculoskeletal: She exhibits no edema or tenderness.  Neurological: She is alert and oriented to person, place, and time.  Skin: Skin is warm and dry. No rash noted. She is not diaphoretic. No erythema.  Nursing note and vitals reviewed.    ED Treatments / Results  Labs (all labs ordered are listed, but only abnormal results are displayed) Labs Reviewed  CBC WITH DIFFERENTIAL/PLATELET - Abnormal; Notable for the following components:      Result Value   WBC 19.8 (*)    RBC 2.85 (*)    Hemoglobin 10.2 (*)    HCT 29.2 (*)    MCV 102.5 (*)    MCH 35.8 (*)    RDW 17.6 (*)    Neutro Abs 17.6 (*)    All other components within normal limits  COMPREHENSIVE METABOLIC PANEL - Abnormal; Notable for the following components:   Sodium 132 (*)    Chloride 93 (*)    Glucose, Bld 131 (*)    Albumin 2.7 (*)    AST  290 (*)    Alkaline Phosphatase 559 (*)    Total Bilirubin 4.7 (*)    All other components within normal limits  URINE CULTURE  CULTURE, BLOOD (ROUTINE X 2)  CULTURE, BLOOD (ROUTINE X 2)  LIPASE, BLOOD  URINALYSIS, ROUTINE W REFLEX MICROSCOPIC  I-STAT TROPONIN, ED  I-STAT CG4 LACTIC ACID, ED  I-STAT CG4 LACTIC ACID, ED    EKG  EKG Interpretation None       Radiology Dg Chest 2 View  Result Date: 04/24/2017 CLINICAL DATA:  Shortness of breath. All on immunotherapy for lung cancer. Ex-smoker. EXAM: CHEST  2 VIEW COMPARISON:  CT 03/09/2017.  Plain film of 01/09/2017. FINDINGS: Lateral view degraded by patient arm position. Right Port-A-Cath terminates at the low SVC. Midline trachea. Normal heart size. New small left pleural effusion. Probable treatment effects in the left upper lobe laterally. Clear right lung. Left base subsegmental atelectasis. IMPRESSION: New small left pleural effusion with adjacent left base subsegmental atelectasis. Electronically Signed   By: Abigail Miyamoto M.D.   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 History of palliative radiotherapy of an enlarging left upper lobe pulmonary nodule. Ongoing medical therapy. Patient presents with generalized abdominal pain and clinical concern for pulmonary embolism. EXAM: CT ANGIOGRAPHY CHEST CT ABDOMEN AND PELVIS WITH CONTRAST TECHNIQUE: Multidetector CT imaging of the chest was performed using the standard protocol during bolus administration of intravenous contrast. Multiplanar CT image reconstructions and MIPs were obtained to evaluate the vascular anatomy. Multidetector CT imaging of the abdomen and pelvis was performed using the standard protocol during bolus administration of intravenous contrast. CONTRAST:  < 100 cc > ISOVUE-370 IOPAMIDOL (ISOVUE-370) INJECTION 76%  COMPARISON:  03/09/2017 CT chest and abdomen. Chest radiograph from earlier today. 10/15/2016 CT abdomen/pelvis. FINDINGS: CTA CHEST FINDINGS Cardiovascular: The study is moderate quality for the evaluation of pulmonary embolism, with evaluation of the subsegmental vessels limited by motion. There are no filling defects in the central, lobar, segmental or subsegmental pulmonary artery branches to suggest acute pulmonary embolism. Mildly atherosclerotic nonaneurysmal thoracic aorta. Normal caliber pulmonary arteries. Normal heart size. No significant pericardial fluid/thickening. Right internal jugular MediPort terminates in the right atrium. Mediastinum/Nodes: No discrete thyroid nodules. Unremarkable esophagus. No axillary adenopathy. Newly enlarged 2.0 cm right supraclavicular node (series 7/image 8). Increased right paratracheal adenopathy measuring up to 2.1 cm (series 7/ image 24), previously 1.2 cm. Increased prevascular bilateral mediastinal adenopathy measuring up to 2.0 cm on the right (series 7/ image 26), previously 1.3 cm. Increased 1.9 cm enlarged subcarinal node (series 7/ image 42), previously 1.1 cm. Newly enlarged 1.8 cm AP window node (series 7/ image 35). Newly moderate infiltrative left hilar adenopathy measuring up to 2.0 cm (series 7/ image 48), with associated extrinsic narrowing of segmental lingular and left lower lobe airways. No right hilar adenopathy. Lungs/Pleura: No pneumothorax. No right pleural effusion. New small dependent left pleural effusion. Mild centrilobular emphysema with mild diffuse bronchial wall thickening. Stable bandlike consolidation in the anterior left upper lobe compatible with radiation fibrosis. Stable irregular solid 1.5 cm left upper lobe pulmonary nodule (series 13/image 40). New patchy consolidation, parenchymal banding and nodularity in the posterior left upper lobe and anterior left lower lobe. A few new scattered solid pulmonary nodules in the right lung,  largest 6 mm in the anterior right middle lobe (series 13/ image 76). Musculoskeletal: No aggressive appearing focal osseous lesions. Moderate thoracic spondylosis. Review of the MIP images confirms the above findings. CT ABDOMEN and PELVIS FINDINGS Hepatobiliary: New hepatomegaly. Innumerable hypoenhancing masses replacing much of the liver,  significantly increased in size and number, for example a 2.0 cm superior liver mass (series 4/ image 15), increased from 0.7 cm. Normal gallbladder with no radiopaque cholelithiasis. No biliary ductal dilatation. Pancreas: Normal, with no mass or duct dilation. Spleen: Normal size. No mass. Adrenals/Urinary Tract: Newly apparent 1.4 cm left adrenal nodule with density 64 HU. No right adrenal nodule. No hydronephrosis. Subcentimeter hypodense right renal cortical lesion is too small to characterize and is stable. No new renal lesions. Normal bladder. Stomach/Bowel: Normal non-distended stomach. Normal appendix. Mild sigmoid diverticulosis. There is a gas containing 6.0 x 4.9 x 7.2 cm multilocular abscess in the ventral left lower abdomen (series 4/image 66) with thick enhancing wall, which appears to demonstrate a fistulous connection to the proximal sigmoid colon (series 15/image 70), and which is intimately associated with multiple small bowel loops. No small bowel dilatation. No focal small bowel caliber transition. No definite small bowel wall thickening. Vascular/Lymphatic: Atherosclerotic nonaneurysmal abdominal aorta. Patent portal, splenic, hepatic and renal veins. No pathologically enlarged lymph nodes in the abdomen or pelvis. Reproductive: Status post hysterectomy, with no abnormal findings at the vaginal cuff. No adnexal mass. Other: No ascites. Musculoskeletal: Previously visualized faintly sclerotic lesions throughout the lumbar vertebral bodies are not discretely visualized on today's scan. Mild lumbar spondylosis. Review of the MIP images confirms the above  findings. IMPRESSION: 1. No evidence of pulmonary embolism. 2. Complex gas-containing multilocular abscess in the anterior left lower abdomen with fistulous connection to the proximal sigmoid colon (suggesting the sequela of perforated diverticulitis), intimately associated with multiple small bowel loops. No evidence of bowel obstruction. 3. Significant progression of metastatic disease. Right supraclavicular, bilateral mediastinal and left hilar adenopathy is significantly increased. 4. New extrinsic narrowing of segmental left upper and left lower lobe airways by the infiltrative left hilar adenopathy. New patchy consolidation and nodularity in the posterior left upper lobe and anterior left lower lobe, favor a combination of postobstructive pneumonia and tumor. New scattered probable small pulmonary metastases in the right lung. 5. Liver metastases are significantly increased in size and number with new hepatomegaly. 6. New left adrenal metastasis. 7. New small dependent left pleural effusion. 8. Aortic Atherosclerosis (ICD10-I70.0) and Emphysema (ICD10-J43.9). Electronically Signed   By: Ilona Sorrel M.D.   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 History of palliative radiotherapy of an enlarging left upper lobe pulmonary nodule. Ongoing medical therapy. Patient presents with generalized abdominal pain and clinical concern for pulmonary embolism. EXAM: CT ANGIOGRAPHY CHEST CT ABDOMEN AND PELVIS WITH CONTRAST TECHNIQUE: Multidetector CT imaging of the chest was performed using the standard protocol during bolus administration of intravenous contrast. Multiplanar CT image reconstructions and MIPs were obtained to evaluate the vascular anatomy. Multidetector CT imaging of the abdomen and pelvis was performed using the standard protocol during bolus administration of  intravenous contrast. CONTRAST:  < 100 cc > ISOVUE-370 IOPAMIDOL (ISOVUE-370) INJECTION 76% COMPARISON:  03/09/2017 CT chest and abdomen. Chest radiograph from earlier today. 10/15/2016 CT abdomen/pelvis. FINDINGS: CTA CHEST FINDINGS Cardiovascular: The study is moderate quality for the evaluation of pulmonary embolism, with evaluation of the subsegmental vessels limited by motion. There are no filling defects in the central, lobar, segmental or subsegmental pulmonary artery branches to suggest acute pulmonary embolism. Mildly atherosclerotic nonaneurysmal thoracic aorta. Normal caliber pulmonary arteries. Normal heart size. No significant pericardial fluid/thickening. Right internal jugular MediPort terminates  in the right atrium. Mediastinum/Nodes: No discrete thyroid nodules. Unremarkable esophagus. No axillary adenopathy. Newly enlarged 2.0 cm right supraclavicular node (series 7/image 8). Increased right paratracheal adenopathy measuring up to 2.1 cm (series 7/ image 24), previously 1.2 cm. Increased prevascular bilateral mediastinal adenopathy measuring up to 2.0 cm on the right (series 7/ image 26), previously 1.3 cm. Increased 1.9 cm enlarged subcarinal node (series 7/ image 42), previously 1.1 cm. Newly enlarged 1.8 cm AP window node (series 7/ image 35). Newly moderate infiltrative left hilar adenopathy measuring up to 2.0 cm (series 7/ image 48), with associated extrinsic narrowing of segmental lingular and left lower lobe airways. No right hilar adenopathy. Lungs/Pleura: No pneumothorax. No right pleural effusion. New small dependent left pleural effusion. Mild centrilobular emphysema with mild diffuse bronchial wall thickening. Stable bandlike consolidation in the anterior left upper lobe compatible with radiation fibrosis. Stable irregular solid 1.5 cm left upper lobe pulmonary nodule (series 13/image 40). New patchy consolidation, parenchymal banding and nodularity in the posterior left upper lobe and  anterior left lower lobe. A few new scattered solid pulmonary nodules in the right lung, largest 6 mm in the anterior right middle lobe (series 13/ image 76). Musculoskeletal: No aggressive appearing focal osseous lesions. Moderate thoracic spondylosis. Review of the MIP images confirms the above findings. CT ABDOMEN and PELVIS FINDINGS Hepatobiliary: New hepatomegaly. Innumerable hypoenhancing masses replacing much of the liver, significantly increased in size and number, for example a 2.0 cm superior liver mass (series 4/ image 15), increased from 0.7 cm. Normal gallbladder with no radiopaque cholelithiasis. No biliary ductal dilatation. Pancreas: Normal, with no mass or duct dilation. Spleen: Normal size. No mass. Adrenals/Urinary Tract: Newly apparent 1.4 cm left adrenal nodule with density 64 HU. No right adrenal nodule. No hydronephrosis. Subcentimeter hypodense right renal cortical lesion is too small to characterize and is stable. No new renal lesions. Normal bladder. Stomach/Bowel: Normal non-distended stomach. Normal appendix. Mild sigmoid diverticulosis. There is a gas containing 6.0 x 4.9 x 7.2 cm multilocular abscess in the ventral left lower abdomen (series 4/image 66) with thick enhancing wall, which appears to demonstrate a fistulous connection to the proximal sigmoid colon (series 15/image 70), and which is intimately associated with multiple small bowel loops. No small bowel dilatation. No focal small bowel caliber transition. No definite small bowel wall thickening. Vascular/Lymphatic: Atherosclerotic nonaneurysmal abdominal aorta. Patent portal, splenic, hepatic and renal veins. No pathologically enlarged lymph nodes in the abdomen or pelvis. Reproductive: Status post hysterectomy, with no abnormal findings at the vaginal cuff. No adnexal mass. Other: No ascites. Musculoskeletal: Previously visualized faintly sclerotic lesions throughout the lumbar vertebral bodies are not discretely visualized  on today's scan. Mild lumbar spondylosis. Review of the MIP images confirms the above findings. IMPRESSION: 1. No evidence of pulmonary embolism. 2. Complex gas-containing multilocular abscess in the anterior left lower abdomen with fistulous connection to the proximal sigmoid colon (suggesting the sequela of perforated diverticulitis), intimately associated with multiple small bowel loops. No evidence of bowel obstruction. 3. Significant progression of metastatic disease. Right supraclavicular, bilateral mediastinal and left hilar adenopathy is significantly increased. 4. New extrinsic narrowing of segmental left upper and left lower lobe airways by the infiltrative left hilar adenopathy. New patchy consolidation and nodularity in the posterior left upper lobe and anterior left lower lobe, favor a combination of postobstructive pneumonia and tumor. New scattered probable small pulmonary metastases in the right lung. 5. Liver metastases are significantly increased in size and number with new hepatomegaly. 6.  New left adrenal metastasis. 7. New small dependent left pleural effusion. 8. Aortic Atherosclerosis (ICD10-I70.0) and Emphysema (ICD10-J43.9). Electronically Signed   By: Ilona Sorrel M.D.   On: 04/24/2017 18:01    Procedures .Critical Care Performed by: Gareth Morgan, MD Authorized by: Gareth Morgan, MD   Critical care provider statement:    Critical care time (minutes):  30   Critical care was necessary to treat or prevent imminent or life-threatening deterioration of the following conditions:  Sepsis   Critical care was time spent personally by me on the following activities:  Evaluation of patient's response to treatment, examination of patient, discussions with consultants, development of treatment plan with patient or surrogate, ordering and review of laboratory studies, ordering and review of radiographic studies, review of old charts and re-evaluation of patient's condition    (including critical care time)  Medications Ordered in ED Medications  sodium chloride 0.9 % bolus 1,000 mL (1,000 mLs Intravenous New Bag/Given 04/24/17 1735)  sodium chloride 0.9 % bolus 1,000 mL (0 mLs Intravenous Stopped 04/24/17 1811)  morphine 4 MG/ML injection 4 mg (4 mg Intravenous Given 04/24/17 1548)  iopamidol (ISOVUE-370) 76 % injection (100 mLs Intravenous Contrast Given 04/24/17 1704)  piperacillin-tazobactam (ZOSYN) IVPB 3.375 g (0 g Intravenous Stopped 04/24/17 1815)  vancomycin (VANCOCIN) 1,500 mg in sodium chloride 0.9 % 500 mL IVPB (1,500 mg Intravenous New Bag/Given 04/24/17 1810)  morphine 4 MG/ML injection 4 mg (4 mg Intravenous Given 04/24/17 1810)     Initial Impression / Assessment and Plan / ED Course  I have reviewed the triage vital signs and the nursing notes.  Pertinent labs & imaging results that were available during my care of the patient were reviewed by me and considered in my medical decision making (see chart for details).    61 year old female with history of extensive stage small cell lung cancer, metastatic brain disease, now on immunotherapy presents with concern for abdominal pain, chest pain, constipation, dyspnea.  Patient tachycardic to 132 on arrival, and afebrile.  Reports multiple symptoms, with wide differential for symptoms on arrival.  Labs significant for leukocytosis. Given leukocytosis, tachycardia, report of subjective fever at home and high risk pt, will draw blood cx and empirically cover for sepsis of unclear etiology with vanc and zosyn. XR shows new pleural effusion on left.   Given tachycardia, chest pain, shortness of breath, and cancer history, CT PE study was done which showed worsening nodularity, likely combination of postobstructive pneumonia and tumor, worsening lymphadenopathy.  Given significant abdominal tenderness and tachycardia, CT abdomen pelvis was done which showed multilocular abscess in the anterior left abdomen  with fistulous connection to the sigmoid colon.  Consulted Dr. Excell Seltzer of General Surgery, who will come to evaluate the patient.  Patient will be admitted to the hospitalist service, Dr. Alcario Drought for further care.   Final Clinical Impressions(s) / ED Diagnoses   Final diagnoses:  Sepsis, due to unspecified organism [facility])  Abscess of abdominal cavity ([place])  Postobstructive pneumonia    ED Discharge Orders    None       Gareth Morgan, MD 04/24/17 2017

## 2017-04-24 NOTE — ED Triage Notes (Signed)
Patient c/o generalized abdominal pain for past 2 days. States she thinks she may be dehydrated and is having hard stools despite stool softeners. Denies N/V. Reports decreased urine output for past 2 days as well. Pt on immunotherapy and has treatment this past week for lung cancer. Denies any recent fevers.

## 2017-04-24 NOTE — Consult Note (Signed)
Reason for Consult: Abdominal abscess Referring Physician: [name] is an 61 y.o. female.  HPI: Patient is a pleasant unfortunate 61 year old female with widely metastatic small cell lung cancer.  She has been through 2 rounds of chemotherapy with recent progression of her disease and is currently on immunotherapy.  She has an enlarging lung mass as well as mediastinal adenopathy, brain metastasis and multiple liver metastases. She presents to the emergency department today due to about 3 days of gradually worsening abdominal pain and 4-5 days of worsening shortness of breath and malaise and weakness.  She describes fairly constant pain across her lower abdomen.  This has been gradually steadily increasing.  She has some chronic constipation which has been worse in the last few days.  No melena or hematochezia.  Denies fever or chills.  She states she knows she had diverticulosis but no history of diverticulitis or other colon problems.  Past Medical History:  Diagnosis Date  . Cancer of right breast ([place]) 2009  . CAP (community acquired pneumonia) 11/06/2015  . Chemotherapy induced neutropenia ([place]) 12/17/2016  . COPD (chronic obstructive pulmonary disease) ([place])   . Elevated blood pressure   . Encounter for antineoplastic chemotherapy 01/05/2016  . History of radiation therapy 05/04/16-05/18/16   whole brain 25 Gy in 10 fractions  . History of radiation therapy 09/01/16 - 09/21/16   Left Lung treated to 35 Gy in 14 fractions  . Hoarseness of voice    "for the last 2 months" (11/06/2015)  . Hypertension   . Hypokalemia 12/17/2016  . Lung mass dx'd 10/2015  . Menopause   . Personal history of breast cancer 03/29/2016  . Pre-diabetes   . Small cell lung cancer ([place])   . Vitamin D deficiency     Past Surgical History:  Procedure Laterality Date  . BREAST BIOPSY Right 2009  . BREAST LUMPECTOMY Right 2009  . IR FLUORO GUIDE PORT INSERTION RIGHT  02/01/2017  . IR US GUIDE VASC ACCESS  RIGHT  02/01/2017  . UTERINE FIBROID SURGERY  2000  . VAGINAL HYSTERECTOMY  2000  . VESICOVAGINAL FISTULA CLOSURE W/ TAH  2000  . VIDEO BRONCHOSCOPY WITH ENDOBRONCHIAL ULTRASOUND N/A 11/10/2015   Procedure: VIDEO BRONCHOSCOPY WITH ENDOBRONCHIAL ULTRASOUND;  Surgeon: Javier Glazier, MD;  Location: [facility];  Service: Thoracic;  Laterality: N/A;    Family History  Adopted: Yes  Problem Relation Age of Onset  . Cancer Neg Hx     Social History:  reports that she quit smoking about 17 months ago. Her smoking use included cigarettes. She has a 4.60 pack-year smoking history. she has never used smokeless tobacco. She reports that she uses drugs. Drug: Marijuana. She reports that she does not drink alcohol.  Allergies: No Known Allergies  Current Facility-Administered Medications  Medication Dose Route Frequency Provider Last Rate Last Dose  . 0.9 %  sodium chloride infusion   Intravenous Continuous Etta Quill, DO      . acetaminophen (TYLENOL) tablet 650 mg  650 mg Oral Q6H PRN Etta Quill, DO       Or  . acetaminophen (TYLENOL) suppository 650 mg  650 mg Rectal Q6H PRN Etta Quill, DO      . albuterol (PROVENTIL) (2.5 MG/3ML) 0.083% nebulizer solution 2.5 mg  2.5 mg Inhalation Q6H PRN Etta Quill, DO      . ferrous sulfate EC tablet 325 mg  325 mg Oral Daily Jennette Kettle M, DO      .  fluticasone (FLONASE) 50 MCG/ACT nasal spray 1 spray  1 spray Each Nare Daily Alcario Drought, Jared M, DO      . fluticasone furoate-vilanterol (BREO ELLIPTA) 200-25 MCG/INH 1 puff  1 puff Inhalation Daily Alcario Drought, Jared M, DO      . ipratropium-albuterol (DUONEB) 0.5-2.5 (3) MG/3ML nebulizer solution 3 mL  3 mL Nebulization Q6H Etta Quill, DO   3 mL at 04/24/17 2147  . magnesium oxide (MAG-OX) tablet 400 mg  400 mg Oral QID Jennette Kettle M, DO      . meclizine (ANTIVERT) tablet 12.5-25 mg  12.5-25 mg Oral TID PRN Etta Quill, DO      . montelukast (SINGULAIR) tablet 10 mg  10 mg Oral  QHS Jennette Kettle M, DO      . morphine 4 MG/ML injection 2-4 mg  2-4 mg Intravenous Q4H PRN Etta Quill, DO      . ondansetron [facility]) tablet 4 mg  4 mg Oral Q6H PRN Etta Quill, DO       Or  . ondansetron [facility]) injection 4 mg  4 mg Intravenous Q6H PRN Etta Quill, DO      . piperacillin-tazobactam (ZOSYN) IVPB 3.375 g  3.375 g Intravenous Q8H Poindexter, Leann T, RPH      . potassium chloride 20 MEQ/15ML (10%) solution 20 mEq  20 mEq Oral BID Jennette Kettle M, DO      . senna (SENOKOT) tablet 8.6 mg  1 tablet Oral Daily Alcario Drought, Jared M, DO      . [START ON 04/25/2017] vancomycin (VANCOCIN) IVPB 1000 mg/200 mL premix  1,000 mg Intravenous Q24H Poindexter, Leann T, RPH       Current Outpatient Medications  Medication Sig Dispense Refill  . albuterol (PROVENTIL HFA;VENTOLIN HFA) 108 (90 Base) MCG/ACT inhaler Inhale 1 puff into the lungs every 6 (six) hours as needed for wheezing or shortness of breath.    . budesonide-formoterol (SYMBICORT) 160-4.5 MCG/ACT inhaler Inhale 2 puffs into the lungs 2 (two) times daily. 1 Inhaler 11  . ferrous sulfate 325 (65 FE) MG EC tablet Take 325 mg by mouth daily.    . fluticasone (FLONASE) 50 MCG/ACT nasal spray Place 1 spray into both nostrils daily.     . Ipratropium-Albuterol (COMBIVENT RESPIMAT) 20-100 MCG/ACT AERS respimat Inhale 1 puff into the lungs every 6 (six) hours.    Marland Kitchen loperamide (IMODIUM) 2 MG capsule Take by mouth as needed for diarrhea or loose stools.    Marland Kitchen LORazepam (ATIVAN) 0.5 MG tablet 1 tablet po 30 minutes prior to radiation or MRI 30 tablet 0  . magnesium oxide (MAG-OX) 400 (241.3 Mg) MG tablet Take 1 tablet (400 mg total) by mouth 4 (four) times daily. 120 tablet 0  . meclizine (ANTIVERT) 12.5 MG tablet Take 1-2 tablets (12.5-25 mg total) by mouth 3 (three) times daily as needed for dizziness. 20 tablet 0  . methylPREDNISolone (MEDROL) 4 MG tablet Take 6 tabs on day 1, 5 tabs on day 2, 4 tabs on day 3, 3 tabs on day  4, 2 tabs on day 5, 1 tab on day 6, and then stop 21 tablet 0  . montelukast (SINGULAIR) 10 MG tablet Take 10 mg by mouth at bedtime.   2  . ondansetron (ZOFRAN) 8 MG tablet Take 1 tablet (8 mg total) every 8 (eight) hours as needed by mouth for nausea or vomiting. 20 tablet 1  . polyethylene glycol powder (GLYCOLAX/MIRALAX) powder Take 1 Container by mouth  daily.    . potassium chloride 20 MEQ/15ML (10%) SOLN Take 15 mLs (20 mEq total) by mouth 2 (two) times daily. 900 mL 0  . prochlorperazine (COMPAZINE) 10 MG tablet TAKE 1 TABLET BY MOUTH EVERY 6 HOURS AS NEEDED FOR NAUSEA AND VOMITING 90 tablet 0  . SANTYL ointment APP TOPICALLY UTD  0  . senna (SENOKOT) 8.6 MG TABS tablet Take 1 tablet (8.6 mg total) by mouth daily. 30 tablet 0  . sucralfate (CARAFATE) 1 GM/10ML suspension Take 10 mLs (1 g total) by mouth 4 (four) times daily -  with meals and at bedtime. 420 mL 0  . temozolomide (TEMODAR) 140 MG capsule Take 2 capsules (280 mg total) daily by mouth. Take for days 1-5 every 28 days. May take on empty stomach & at bedtime to decrease N&V. 10 capsule 2  . UNABLE TO FIND Take 10 mLs by mouth daily as needed. Med Name: CBD Oil       Results for orders placed or performed during the hospital encounter of 04/24/17 (from the past 48 hour(s))  CBC with Differential     Status: Abnormal   Collection Time: 04/24/17  3:44 PM  Result Value Ref Range   WBC 19.8 (H) 4.0 - 10.5 K/uL   RBC 2.85 (L) 3.87 - 5.11 MIL/uL   Hemoglobin 10.2 (L) 12.0 - 15.0 g/dL   HCT 29.2 (L) 36.0 - 46.0 %   MCV 102.5 (H) 78.0 - 100.0 fL   MCH 35.8 (H) 26.0 - 34.0 pg   MCHC 34.9 30.0 - 36.0 g/dL   RDW 17.6 (H) 11.5 - 15.5 %   Platelets 176 150 - 400 K/uL   Neutrophils Relative % 89 %   Lymphocytes Relative 8 %   Monocytes Relative 3 %   Eosinophils Relative 0 %   Basophils Relative 0 %   Neutro Abs 17.6 (H) 1.7 - 7.7 K/uL   Lymphs Abs 1.6 0.7 - 4.0 K/uL   Monocytes Absolute 0.6 0.1 - 1.0 K/uL   Eosinophils Absolute  0.0 0.0 - 0.7 K/uL   Basophils Absolute 0.0 0.0 - 0.1 K/uL   Smear Review MORPHOLOGY UNREMARKABLE   Comprehensive metabolic panel     Status: Abnormal   Collection Time: 04/24/17  3:44 PM  Result Value Ref Range   Sodium 132 (L) 135 - 145 mmol/L   Potassium 3.6 3.5 - 5.1 mmol/L   Chloride 93 (L) 101 - 111 mmol/L   CO2 25 22 - 32 mmol/L   Glucose, Bld 131 (H) 65 - 99 mg/dL   BUN 17 6 - 20 mg/dL   Creatinine, Ser 0.91 0.44 - 1.00 mg/dL   Calcium 10.2 8.9 - 10.3 mg/dL   Total Protein 6.8 6.5 - 8.1 g/dL   Albumin 2.7 (L) 3.5 - 5.0 g/dL   AST 290 (H) 15 - 41 U/L   ALT 46 14 - 54 U/L   Alkaline Phosphatase 559 (H) 38 - 126 U/L   Total Bilirubin 4.7 (H) 0.3 - 1.2 mg/dL   GFR calc non Af Amer >60 >60 mL/min   GFR calc Af Amer >60 >60 mL/min    Comment: (NOTE) The eGFR has been calculated using the CKD EPI equation. This calculation has not been validated in all clinical situations. eGFR's persistently <60 mL/min signify possible Chronic Kidney Disease.    Anion gap 14 5 - 15  Lipase, blood     Status: None   Collection Time: 04/24/17  3:44 PM  Result  Value Ref Range   Lipase 20 11 - 51 U/L  I-Stat Troponin, ED (not at [facility])     Status: None   Collection Time: 04/24/17  3:55 PM  Result Value Ref Range   Troponin i, poc 0.00 0.00 - 0.08 ng/mL   Comment 3            Comment: Due to the release kinetics of cTnI, a negative result within the first hours of the onset of symptoms does not rule out myocardial infarction with certainty. If myocardial infarction is still suspected, repeat the test at appropriate intervals.   I-Stat CG4 Lactic Acid, ED     Status: None   Collection Time: 04/24/17  5:54 PM  Result Value Ref Range   Lactic Acid, Venous 1.27 0.5 - 1.9 mmol/L  Urinalysis, Routine w reflex microscopic     Status: Abnormal   Collection Time: 04/24/17  9:18 PM  Result Value Ref Range   Color, Urine AMBER (A) YELLOW    Comment: BIOCHEMICALS MAY BE AFFECTED BY COLOR    APPearance CLEAR CLEAR   Specific Gravity, Urine 1.020 1.005 - 1.030   pH 5.0 5.0 - 8.0   Glucose, UA NEGATIVE NEGATIVE mg/dL   Hgb urine dipstick NEGATIVE NEGATIVE   Bilirubin Urine SMALL (A) NEGATIVE   Ketones, ur 5 (A) NEGATIVE mg/dL   Protein, ur 30 (A) NEGATIVE mg/dL   Nitrite NEGATIVE NEGATIVE   Leukocytes, UA NEGATIVE NEGATIVE   RBC / HPF 0-5 0 - 5 RBC/hpf   WBC, UA 0-5 0 - 5 WBC/hpf   Bacteria, UA RARE (A) NONE SEEN   Squamous Epithelial / LPF NONE SEEN NONE SEEN   Mucus PRESENT     Dg Chest 2 View  Result Date: 04/24/2017 CLINICAL DATA:  Shortness of breath. All on immunotherapy for lung cancer. Ex-smoker. EXAM: CHEST  2 VIEW COMPARISON:  CT 03/09/2017.  Plain film of 01/09/2017. FINDINGS: Lateral view degraded by patient arm position. Right Port-A-Cath terminates at the low SVC. Midline trachea. Normal heart size. New small left pleural effusion. Probable treatment effects in the left upper lobe laterally. Clear right lung. Left base subsegmental atelectasis. IMPRESSION: New small left pleural effusion with adjacent left base subsegmental atelectasis. Electronically Signed   By: Abigail Miyamoto M.D.   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 History of palliative radiotherapy of an enlarging left upper lobe pulmonary nodule. Ongoing medical therapy. Patient presents with generalized abdominal pain and clinical concern for pulmonary embolism. EXAM: CT ANGIOGRAPHY CHEST CT ABDOMEN AND PELVIS WITH CONTRAST TECHNIQUE: Multidetector CT imaging of the chest was performed using the standard protocol during bolus administration of intravenous contrast. Multiplanar CT image reconstructions and MIPs were obtained to evaluate the vascular anatomy. Multidetector CT imaging of the abdomen and pelvis was performed using the standard protocol during  bolus administration of intravenous contrast. CONTRAST:  < 100 cc > ISOVUE-370 IOPAMIDOL (ISOVUE-370) INJECTION 76% COMPARISON:  03/09/2017 CT chest and abdomen. Chest radiograph from earlier today. 10/15/2016 CT abdomen/pelvis. FINDINGS: CTA CHEST FINDINGS Cardiovascular: The study is moderate quality for the evaluation of pulmonary embolism, with evaluation of the subsegmental vessels limited by motion. There are no filling defects in the central, lobar, segmental or subsegmental pulmonary artery branches to suggest acute pulmonary embolism. Mildly atherosclerotic nonaneurysmal thoracic aorta. Normal caliber pulmonary arteries. Normal heart size.  No significant pericardial fluid/thickening. Right internal jugular MediPort terminates in the right atrium. Mediastinum/Nodes: No discrete thyroid nodules. Unremarkable esophagus. No axillary adenopathy. Newly enlarged 2.0 cm right supraclavicular node (series 7/image 8). Increased right paratracheal adenopathy measuring up to 2.1 cm (series 7/ image 24), previously 1.2 cm. Increased prevascular bilateral mediastinal adenopathy measuring up to 2.0 cm on the right (series 7/ image 26), previously 1.3 cm. Increased 1.9 cm enlarged subcarinal node (series 7/ image 42), previously 1.1 cm. Newly enlarged 1.8 cm AP window node (series 7/ image 35). Newly moderate infiltrative left hilar adenopathy measuring up to 2.0 cm (series 7/ image 48), with associated extrinsic narrowing of segmental lingular and left lower lobe airways. No right hilar adenopathy. Lungs/Pleura: No pneumothorax. No right pleural effusion. New small dependent left pleural effusion. Mild centrilobular emphysema with mild diffuse bronchial wall thickening. Stable bandlike consolidation in the anterior left upper lobe compatible with radiation fibrosis. Stable irregular solid 1.5 cm left upper lobe pulmonary nodule (series 13/image 40). New patchy consolidation, parenchymal banding and nodularity in the  posterior left upper lobe and anterior left lower lobe. A few new scattered solid pulmonary nodules in the right lung, largest 6 mm in the anterior right middle lobe (series 13/ image 76). Musculoskeletal: No aggressive appearing focal osseous lesions. Moderate thoracic spondylosis. Review of the MIP images confirms the above findings. CT ABDOMEN and PELVIS FINDINGS Hepatobiliary: New hepatomegaly. Innumerable hypoenhancing masses replacing much of the liver, significantly increased in size and number, for example a 2.0 cm superior liver mass (series 4/ image 15), increased from 0.7 cm. Normal gallbladder with no radiopaque cholelithiasis. No biliary ductal dilatation. Pancreas: Normal, with no mass or duct dilation. Spleen: Normal size. No mass. Adrenals/Urinary Tract: Newly apparent 1.4 cm left adrenal nodule with density 64 HU. No right adrenal nodule. No hydronephrosis. Subcentimeter hypodense right renal cortical lesion is too small to characterize and is stable. No new renal lesions. Normal bladder. Stomach/Bowel: Normal non-distended stomach. Normal appendix. Mild sigmoid diverticulosis. There is a gas containing 6.0 x 4.9 x 7.2 cm multilocular abscess in the ventral left lower abdomen (series 4/image 66) with thick enhancing wall, which appears to demonstrate a fistulous connection to the proximal sigmoid colon (series 15/image 70), and which is intimately associated with multiple small bowel loops. No small bowel dilatation. No focal small bowel caliber transition. No definite small bowel wall thickening. Vascular/Lymphatic: Atherosclerotic nonaneurysmal abdominal aorta. Patent portal, splenic, hepatic and renal veins. No pathologically enlarged lymph nodes in the abdomen or pelvis. Reproductive: Status post hysterectomy, with no abnormal findings at the vaginal cuff. No adnexal mass. Other: No ascites. Musculoskeletal: Previously visualized faintly sclerotic lesions throughout the lumbar vertebral bodies  are not discretely visualized on today's scan. Mild lumbar spondylosis. Review of the MIP images confirms the above findings. IMPRESSION: 1. No evidence of pulmonary embolism. 2. Complex gas-containing multilocular abscess in the anterior left lower abdomen with fistulous connection to the proximal sigmoid colon (suggesting the sequela of perforated diverticulitis), intimately associated with multiple small bowel loops. No evidence of bowel obstruction. 3. Significant progression of metastatic disease. Right supraclavicular, bilateral mediastinal and left hilar adenopathy is significantly increased. 4. New extrinsic narrowing of segmental left upper and left lower lobe airways by the infiltrative left hilar adenopathy. New patchy consolidation and nodularity in the posterior left upper lobe and anterior left lower lobe, favor a combination of postobstructive pneumonia and tumor. New scattered probable small pulmonary metastases in the right lung. 5. Liver metastases are significantly  increased in size and number with new hepatomegaly. 6. New left adrenal metastasis. 7. New small dependent left pleural effusion. 8. Aortic Atherosclerosis (ICD10-I70.0) and Emphysema (ICD10-J43.9). Electronically Signed   By: Ilona Sorrel M.D.   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 History of palliative radiotherapy of an enlarging left upper lobe pulmonary nodule. Ongoing medical therapy. Patient presents with generalized abdominal pain and clinical concern for pulmonary embolism. EXAM: CT ANGIOGRAPHY CHEST CT ABDOMEN AND PELVIS WITH CONTRAST TECHNIQUE: Multidetector CT imaging of the chest was performed using the standard protocol during bolus administration of intravenous contrast. Multiplanar CT image reconstructions and MIPs were obtained to evaluate the vascular anatomy.  Multidetector CT imaging of the abdomen and pelvis was performed using the standard protocol during bolus administration of intravenous contrast. CONTRAST:  < 100 cc > ISOVUE-370 IOPAMIDOL (ISOVUE-370) INJECTION 76% COMPARISON:  03/09/2017 CT chest and abdomen. Chest radiograph from earlier today. 10/15/2016 CT abdomen/pelvis. FINDINGS: CTA CHEST FINDINGS Cardiovascular: The study is moderate quality for the evaluation of pulmonary embolism, with evaluation of the subsegmental vessels limited by motion. There are no filling defects in the central, lobar, segmental or subsegmental pulmonary artery branches to suggest acute pulmonary embolism. Mildly atherosclerotic nonaneurysmal thoracic aorta. Normal caliber pulmonary arteries. Normal heart size. No significant pericardial fluid/thickening. Right internal jugular MediPort terminates in the right atrium. Mediastinum/Nodes: No discrete thyroid nodules. Unremarkable esophagus. No axillary adenopathy. Newly enlarged 2.0 cm right supraclavicular node (series 7/image 8). Increased right paratracheal adenopathy measuring up to 2.1 cm (series 7/ image 24), previously 1.2 cm. Increased prevascular bilateral mediastinal adenopathy measuring up to 2.0 cm on the right (series 7/ image 26), previously 1.3 cm. Increased 1.9 cm enlarged subcarinal node (series 7/ image 42), previously 1.1 cm. Newly enlarged 1.8 cm AP window node (series 7/ image 35). Newly moderate infiltrative left hilar adenopathy measuring up to 2.0 cm (series 7/ image 48), with associated extrinsic narrowing of segmental lingular and left lower lobe airways. No right hilar adenopathy. Lungs/Pleura: No pneumothorax. No right pleural effusion. New small dependent left pleural effusion. Mild centrilobular emphysema with mild diffuse bronchial wall thickening. Stable bandlike consolidation in the anterior left upper lobe compatible with radiation fibrosis. Stable irregular solid 1.5 cm left upper lobe pulmonary  nodule (series 13/image 40). New patchy consolidation, parenchymal banding and nodularity in the posterior left upper lobe and anterior left lower lobe. A few new scattered solid pulmonary nodules in the right lung, largest 6 mm in the anterior right middle lobe (series 13/ image 76). Musculoskeletal: No aggressive appearing focal osseous lesions. Moderate thoracic spondylosis. Review of the MIP images confirms the above findings. CT ABDOMEN and PELVIS FINDINGS Hepatobiliary: New hepatomegaly. Innumerable hypoenhancing masses replacing much of the liver, significantly increased in size and number, for example a 2.0 cm superior liver mass (series 4/ image 15), increased from 0.7 cm. Normal gallbladder with no radiopaque cholelithiasis. No biliary ductal dilatation. Pancreas: Normal, with no mass or duct dilation. Spleen: Normal size. No mass. Adrenals/Urinary Tract: Newly apparent 1.4 cm left adrenal nodule with density 64 HU. No right adrenal nodule. No hydronephrosis. Subcentimeter hypodense right renal cortical lesion is too small to characterize and is stable. No new renal lesions. Normal bladder. Stomach/Bowel: Normal non-distended stomach. Normal appendix. Mild sigmoid diverticulosis. There is a gas containing 6.0 x 4.9 x 7.2 cm multilocular abscess in the ventral  left lower abdomen (series 4/image 66) with thick enhancing wall, which appears to demonstrate a fistulous connection to the proximal sigmoid colon (series 15/image 70), and which is intimately associated with multiple small bowel loops. No small bowel dilatation. No focal small bowel caliber transition. No definite small bowel wall thickening. Vascular/Lymphatic: Atherosclerotic nonaneurysmal abdominal aorta. Patent portal, splenic, hepatic and renal veins. No pathologically enlarged lymph nodes in the abdomen or pelvis. Reproductive: Status post hysterectomy, with no abnormal findings at the vaginal cuff. No adnexal mass. Other: No ascites.  Musculoskeletal: Previously visualized faintly sclerotic lesions throughout the lumbar vertebral bodies are not discretely visualized on today's scan. Mild lumbar spondylosis. Review of the MIP images confirms the above findings. IMPRESSION: 1. No evidence of pulmonary embolism. 2. Complex gas-containing multilocular abscess in the anterior left lower abdomen with fistulous connection to the proximal sigmoid colon (suggesting the sequela of perforated diverticulitis), intimately associated with multiple small bowel loops. No evidence of bowel obstruction. 3. Significant progression of metastatic disease. Right supraclavicular, bilateral mediastinal and left hilar adenopathy is significantly increased. 4. New extrinsic narrowing of segmental left upper and left lower lobe airways by the infiltrative left hilar adenopathy. New patchy consolidation and nodularity in the posterior left upper lobe and anterior left lower lobe, favor a combination of postobstructive pneumonia and tumor. New scattered probable small pulmonary metastases in the right lung. 5. Liver metastases are significantly increased in size and number with new hepatomegaly. 6. New left adrenal metastasis. 7. New small dependent left pleural effusion. 8. Aortic Atherosclerosis (ICD10-I70.0) and Emphysema (ICD10-J43.9). Electronically Signed   By: Ilona Sorrel M.D.   On: 04/24/2017 18:01    Review of Systems  Constitutional: Positive for malaise/fatigue. Negative for chills and fever.  Respiratory: Positive for cough and shortness of breath.   Cardiovascular: Positive for chest pain.  Gastrointestinal: Positive for abdominal pain and constipation. Negative for blood in stool, diarrhea, nausea and vomiting.  Neurological: Positive for weakness.   Blood pressure 127/76, pulse (!) 115, temperature 98.4 F (36.9 C), temperature source Oral, resp. rate 16, SpO2 97 %. Physical Exam General: Alert, pleasant chronically ill appearing Caucasian  female, in mild distress secondary to shortness of breath Skin: Warm and dry without rash or infection. HEENT: No palpable masses or thyromegaly. Sclera nonicteric. Pupils equal round and reactive.  Lymph nodes: No cervical, supraclavicular,  nodes palpable. Lungs: Breath sounds clear but mild tachypnea and increased work of breathing Cardiovascular: Regular tachycardia without murmur. No JVD or edema.  Abdomen: Mildly obese.  Nondistended.  Mild diffuse tenderness without guarding or peritoneal signs.  No palpable masses.  No palpable hernias. Extremities: No edema or joint swelling or deformity. No chronic venous stasis changes. Neurologic: Alert and fully oriented.  Affect appropriate.  No gross motor deficits.  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  Recent tumor progression.  Now on immunotherapy.  She presents with 4 days of abdominal pain and CT scan which I have reviewed shows an approximately 7 cm abscess anteriorly in the left lower quadrant.  This is most closely associated with the sigmoid colon with possible fistula and some matted loops of small bowel around this.  This is consistent with a diverticular abscess but in this setting certainly could be due to complications from treatment or even tumor and could even be small bowel perforation. She clearly would not tolerate any major surgery.  However this may be able to be successfully managed with percutaneous  drainage and antibiotics.  Recommend interventional radiology consult for CT-guided percutaneous drainage.  Continue broad-spectrum antibiotics.  We will follow but I do not believe that she would be a surgical candidate under any circumstances. Darene Lamer Patience Nuzzo 04/24/2017, 10:37 PM

## 2017-04-24 NOTE — H&P (Signed)
History and Physical    Sharon [name]:[id number] DOB: [birth year] DOA: 04/24/2017  PCP: [name], PA  Patient coming from: Home  I have personally briefly reviewed patient's old medical records in [place]  Chief Complaint: Abdominal pain  HPI: Sharon [name] is a 61 y.o. female with medical history significant of SCLC with widespread metastatic disease.  Currently on immunotherapy.  Patient presents to the ED with c/o Abd pain.  Noted at 830am today.  Worse when laying down.  Sharp, dull, and cramping pain.  hasnt had BMs, tried stool softener.  Yesterday had hard BM, today had to disimpact self to have BM.  CP for 3 days, retrosternal superior chest.  Nothing makes better or worse.  Inhaler didn't help.  Just finished 2nd round of immunotherapy on xmas.   ED Course: WBC 19.8.  Alk phos 559, AST 290.  CT chest/abd/pelvis: Perforated diverticulitis with abscess with fistula, cancer is progressing.   Review of Systems: As per HPI otherwise 10 point review of systems negative.   Past Medical History:  Diagnosis Date  . Cancer of right breast ([place]) 2009  . CAP (community acquired pneumonia) 11/06/2015  . Chemotherapy induced neutropenia ([place]) 12/17/2016  . COPD (chronic obstructive pulmonary disease) ([place])   . Elevated blood pressure   . Encounter for antineoplastic chemotherapy 01/05/2016  . History of radiation therapy 05/04/16-05/18/16   whole brain 25 Gy in 10 fractions  . History of radiation therapy 09/01/16 - 09/21/16   Left Lung treated to 35 Gy in 14 fractions  . Hoarseness of voice    "for the last 2 months" (11/06/2015)  . Hypertension   . Hypokalemia 12/17/2016  . Lung mass dx'd 10/2015  . Menopause   . Personal history of breast cancer 03/29/2016  . Pre-diabetes   . Small cell lung cancer ([place])   . Vitamin D deficiency     Past Surgical History:  Procedure Laterality Date  . BREAST BIOPSY Right 2009  . BREAST LUMPECTOMY Right 2009  . IR FLUORO GUIDE  PORT INSERTION RIGHT  02/01/2017  . IR US GUIDE VASC ACCESS RIGHT  02/01/2017  . UTERINE FIBROID SURGERY  2000  . VAGINAL HYSTERECTOMY  2000  . VESICOVAGINAL FISTULA CLOSURE W/ TAH  2000  . VIDEO BRONCHOSCOPY WITH ENDOBRONCHIAL ULTRASOUND N/A 11/10/2015   Procedure: VIDEO BRONCHOSCOPY WITH ENDOBRONCHIAL ULTRASOUND;  Surgeon: Javier Glazier, MD;  Location: [facility];  Service: Thoracic;  Laterality: N/A;     reports that she quit smoking about 17 months ago. Her smoking use included cigarettes. She has a 4.60 pack-year smoking history. she has never used smokeless tobacco. She reports that she uses drugs. Drug: Marijuana. She reports that she does not drink alcohol.  No Known Allergies  Family History  Adopted: Yes  Problem Relation Age of Onset  . Cancer Neg Hx      Prior to Admission medications   Medication Sig Start Date End Date Taking? Authorizing Provider  albuterol (PROVENTIL HFA;VENTOLIN HFA) 108 (90 Base) MCG/ACT inhaler Inhale 1 puff into the lungs every 6 (six) hours as needed for wheezing or shortness of breath.   Yes [provider]  budesonide-formoterol (SYMBICORT) 160-4.5 MCG/ACT inhaler Inhale 2 puffs into the lungs 2 (two) times daily. 08/17/16  Yes Javier Glazier, MD  ferrous sulfate 325 (65 FE) MG EC tablet Take 325 mg by mouth daily.   Yes [provider]  fluticasone (FLONASE) 50 MCG/ACT nasal spray Place 1 spray  into both nostrils daily.  08/20/16  Yes [provider]  Ipratropium-Albuterol (COMBIVENT RESPIMAT) 20-100 MCG/ACT AERS respimat Inhale 1 puff into the lungs every 6 (six) hours.   Yes [provider]  loperamide (IMODIUM) 2 MG capsule Take by mouth as needed for diarrhea or loose stools.   Yes [provider]  LORazepam (ATIVAN) 0.5 MG tablet 1 tablet po 30 minutes prior to radiation or MRI 09/28/16  Yes Hayden Pedro, PA-C  magnesium oxide (MAG-OX) 400 (241.3 Mg) MG tablet Take 1 tablet (400 mg total) by  mouth 4 (four) times daily. 01/26/17  Yes Curcio, Roselie Awkward, NP  meclizine (ANTIVERT) 12.5 MG tablet Take 1-2 tablets (12.5-25 mg total) by mouth 3 (three) times daily as needed for dizziness. 06/05/16  Yes Virgel Manifold, MD  methylPREDNISolone (MEDROL) 4 MG tablet Take 6 tabs on day 1, 5 tabs on day 2, 4 tabs on day 3, 3 tabs on day 4, 2 tabs on day 5, 1 tab on day 6, and then stop 04/13/17  Yes Curcio, Roselie Awkward, NP  montelukast (SINGULAIR) 10 MG tablet Take 10 mg by mouth at bedtime.  09/06/16  Yes [provider]  ondansetron (ZOFRAN) 8 MG tablet Take 1 tablet (8 mg total) every 8 (eight) hours as needed by mouth for nausea or vomiting. 03/11/17  Yes Curcio, Roselie Awkward, NP  polyethylene glycol powder (GLYCOLAX/MIRALAX) powder Take 1 Container by mouth daily.   Yes [provider]  potassium chloride 20 MEQ/15ML (10%) SOLN Take 15 mLs (20 mEq total) by mouth 2 (two) times daily. 02/25/17  Yes Curcio, Roselie Awkward, NP  prochlorperazine (COMPAZINE) 10 MG tablet TAKE 1 TABLET BY MOUTH EVERY 6 HOURS AS NEEDED FOR NAUSEA AND VOMITING 02/11/17  Yes Curcio, Roselie Awkward, NP  SANTYL ointment APP TOPICALLY UTD 12/08/16  Yes [provider]  senna (SENOKOT) 8.6 MG TABS tablet Take 1 tablet (8.6 mg total) by mouth daily. 10/17/16  Yes Patrecia Pour, MD  sucralfate (CARAFATE) 1 GM/10ML suspension Take 10 mLs (1 g total) by mouth 4 (four) times daily -  with meals and at bedtime. 01/19/17  Yes Gery Pray, MD  temozolomide (TEMODAR) 140 MG capsule Take 2 capsules (280 mg total) daily by mouth. Take for days 1-5 every 28 days. May take on empty stomach & at bedtime to decrease N&V. 03/15/17  Yes Curt Bears, MD  UNABLE TO FIND Take 10 mLs by mouth daily as needed. Med Name: CBD Oil   Yes [provider]    Physical Exam: Vitals:   04/24/17 1811 04/24/17 1845 04/24/17 1948 04/24/17 2029  BP: 128/74 117/61 117/61 127/76  Pulse: (!) 124 (!) 114 (!) 117 (!) 115  Resp: (!) '22 20 20  16  ' Temp:      TempSrc:      SpO2: 94% 95% 96% 91%    Constitutional: NAD, calm, comfortable Eyes: PERRL, lids and conjunctivae normal ENMT: Mucous membranes are moist. Posterior pharynx clear of any exudate or lesions.Normal dentition.  Neck: normal, supple, no masses, no thyromegaly Respiratory: clear to auscultation bilaterally, no wheezing, no crackles. Normal respiratory effort. No accessory muscle use.  Cardiovascular: Tachycardia Abdomen: Hepatomegaly, abd tenderness present Musculoskeletal: no clubbing / cyanosis. No joint deformity upper and lower extremities. Good ROM, no contractures. Normal muscle tone.  Skin: no rashes, lesions, ulcers. No induration Neurologic: CN 2-12 grossly intact. Sensation intact, DTR normal. Strength 5/5 in all 4.  Psychiatric: Normal judgment and insight. Alert and oriented  x 3. Normal mood.    Labs on Admission: I have personally reviewed following labs and imaging studies  CBC: Recent Labs  Lab 04/24/17 1544  WBC 19.8*  NEUTROABS 17.6*  HGB 10.2*  HCT 29.2*  MCV 102.5*  PLT 492   Basic Metabolic Panel: Recent Labs  Lab 04/24/17 1544  NA 132*  K 3.6  CL 93*  CO2 25  GLUCOSE 131*  BUN 17  CREATININE 0.91  CALCIUM 10.2   GFR: Estimated Creatinine Clearance: 71.2 mL/min (by C-G formula based on SCr of 0.91 mg/dL). Liver Function Tests: Recent Labs  Lab 04/24/17 1544  AST 290*  ALT 46  ALKPHOS 559*  BILITOT 4.7*  PROT 6.8  ALBUMIN 2.7*   Recent Labs  Lab 04/24/17 1544  LIPASE 20   No results for input(s): AMMONIA in the last 168 hours. Coagulation Profile: No results for input(s): INR, PROTIME in the last 168 hours. Cardiac Enzymes: No results for input(s): CKTOTAL, CKMB, CKMBINDEX, TROPONINI in the last 168 hours. BNP (last 3 results) No results for input(s): PROBNP in the last 8760 hours. HbA1C: No results for input(s): HGBA1C in the last 72 hours. CBG: No results for input(s): GLUCAP in the last 168  hours. Lipid Profile: No results for input(s): CHOL, HDL, LDLCALC, TRIG, CHOLHDL, LDLDIRECT in the last 72 hours. Thyroid Function Tests: No results for input(s): TSH, T4TOTAL, FREET4, T3FREE, THYROIDAB in the last 72 hours. Anemia Panel: No results for input(s): VITAMINB12, FOLATE, FERRITIN, TIBC, IRON, RETICCTPCT in the last 72 hours. Urine analysis:    Component Value Date/Time   LABSPEC 1.010 11/19/2016 1251   PHURINE 6.0 11/19/2016 1251   GLUCOSEU Negative 11/19/2016 1251   HGBUR Negative 11/19/2016 1251   BILIRUBINUR Negative 11/19/2016 1251   KETONESUR Negative 11/19/2016 1251   PROTEINUR Negative 11/19/2016 1251   UROBILINOGEN 0.2 11/19/2016 1251   NITRITE Negative 11/19/2016 1251   LEUKOCYTESUR Negative 11/19/2016 1251    Radiological Exams on Admission: Dg Chest 2 View  Result Date: 04/24/2017 CLINICAL DATA:  Shortness of breath. All on immunotherapy for lung cancer. Ex-smoker. EXAM: CHEST  2 VIEW COMPARISON:  CT 03/09/2017.  Plain film of 01/09/2017. FINDINGS: Lateral view degraded by patient arm position. Right Port-A-Cath terminates at the low SVC. Midline trachea. Normal heart size. New small left pleural effusion. Probable treatment effects in the left upper lobe laterally. Clear right lung. Left base subsegmental atelectasis. IMPRESSION: New small left pleural effusion with adjacent left base subsegmental atelectasis. Electronically Signed   By: Abigail Miyamoto M.D.   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 History of palliative radiotherapy of an enlarging left upper lobe pulmonary nodule. Ongoing medical therapy. Patient presents with generalized abdominal pain and clinical concern for pulmonary embolism. EXAM: CT ANGIOGRAPHY CHEST CT ABDOMEN AND PELVIS WITH CONTRAST TECHNIQUE: Multidetector CT imaging of the chest was  performed using the standard protocol during bolus administration of intravenous contrast. Multiplanar CT image reconstructions and MIPs were obtained to evaluate the vascular anatomy. Multidetector CT imaging of the abdomen and pelvis was performed using the standard protocol during bolus administration of intravenous contrast. CONTRAST:  < 100 cc > ISOVUE-370 IOPAMIDOL (ISOVUE-370) INJECTION 76% COMPARISON:  03/09/2017 CT chest and abdomen. Chest radiograph from earlier today. 10/15/2016 CT abdomen/pelvis. FINDINGS: CTA CHEST FINDINGS Cardiovascular: The study is moderate quality for the evaluation of  pulmonary embolism, with evaluation of the subsegmental vessels limited by motion. There are no filling defects in the central, lobar, segmental or subsegmental pulmonary artery branches to suggest acute pulmonary embolism. Mildly atherosclerotic nonaneurysmal thoracic aorta. Normal caliber pulmonary arteries. Normal heart size. No significant pericardial fluid/thickening. Right internal jugular MediPort terminates in the right atrium. Mediastinum/Nodes: No discrete thyroid nodules. Unremarkable esophagus. No axillary adenopathy. Newly enlarged 2.0 cm right supraclavicular node (series 7/image 8). Increased right paratracheal adenopathy measuring up to 2.1 cm (series 7/ image 24), previously 1.2 cm. Increased prevascular bilateral mediastinal adenopathy measuring up to 2.0 cm on the right (series 7/ image 26), previously 1.3 cm. Increased 1.9 cm enlarged subcarinal node (series 7/ image 42), previously 1.1 cm. Newly enlarged 1.8 cm AP window node (series 7/ image 35). Newly moderate infiltrative left hilar adenopathy measuring up to 2.0 cm (series 7/ image 48), with associated extrinsic narrowing of segmental lingular and left lower lobe airways. No right hilar adenopathy. Lungs/Pleura: No pneumothorax. No right pleural effusion. New small dependent left pleural effusion. Mild centrilobular emphysema with mild  diffuse bronchial wall thickening. Stable bandlike consolidation in the anterior left upper lobe compatible with radiation fibrosis. Stable irregular solid 1.5 cm left upper lobe pulmonary nodule (series 13/image 40). New patchy consolidation, parenchymal banding and nodularity in the posterior left upper lobe and anterior left lower lobe. A few new scattered solid pulmonary nodules in the right lung, largest 6 mm in the anterior right middle lobe (series 13/ image 76). Musculoskeletal: No aggressive appearing focal osseous lesions. Moderate thoracic spondylosis. Review of the MIP images confirms the above findings. CT ABDOMEN and PELVIS FINDINGS Hepatobiliary: New hepatomegaly. Innumerable hypoenhancing masses replacing much of the liver, significantly increased in size and number, for example a 2.0 cm superior liver mass (series 4/ image 15), increased from 0.7 cm. Normal gallbladder with no radiopaque cholelithiasis. No biliary ductal dilatation. Pancreas: Normal, with no mass or duct dilation. Spleen: Normal size. No mass. Adrenals/Urinary Tract: Newly apparent 1.4 cm left adrenal nodule with density 64 HU. No right adrenal nodule. No hydronephrosis. Subcentimeter hypodense right renal cortical lesion is too small to characterize and is stable. No new renal lesions. Normal bladder. Stomach/Bowel: Normal non-distended stomach. Normal appendix. Mild sigmoid diverticulosis. There is a gas containing 6.0 x 4.9 x 7.2 cm multilocular abscess in the ventral left lower abdomen (series 4/image 66) with thick enhancing wall, which appears to demonstrate a fistulous connection to the proximal sigmoid colon (series 15/image 70), and which is intimately associated with multiple small bowel loops. No small bowel dilatation. No focal small bowel caliber transition. No definite small bowel wall thickening. Vascular/Lymphatic: Atherosclerotic nonaneurysmal abdominal aorta. Patent portal, splenic, hepatic and renal veins. No  pathologically enlarged lymph nodes in the abdomen or pelvis. Reproductive: Status post hysterectomy, with no abnormal findings at the vaginal cuff. No adnexal mass. Other: No ascites. Musculoskeletal: Previously visualized faintly sclerotic lesions throughout the lumbar vertebral bodies are not discretely visualized on today's scan. Mild lumbar spondylosis. Review of the MIP images confirms the above findings. IMPRESSION: 1. No evidence of pulmonary embolism. 2. Complex gas-containing multilocular abscess in the anterior left lower abdomen with fistulous connection to the proximal sigmoid colon (suggesting the sequela of perforated diverticulitis), intimately associated with multiple small bowel loops. No evidence of bowel obstruction. 3. Significant progression of metastatic disease. Right supraclavicular, bilateral mediastinal and left hilar adenopathy is significantly increased. 4. New extrinsic narrowing of segmental left upper and left lower lobe airways by the  infiltrative left hilar adenopathy. New patchy consolidation and nodularity in the posterior left upper lobe and anterior left lower lobe, favor a combination of postobstructive pneumonia and tumor. New scattered probable small pulmonary metastases in the right lung. 5. Liver metastases are significantly increased in size and number with new hepatomegaly. 6. New left adrenal metastasis. 7. New small dependent left pleural effusion. 8. Aortic Atherosclerosis (ICD10-I70.0) and Emphysema (ICD10-J43.9). Electronically Signed   By: Ilona Sorrel M.D.   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 History of palliative radiotherapy of an enlarging left upper lobe pulmonary nodule. Ongoing medical therapy. Patient presents with generalized abdominal pain and clinical concern for pulmonary embolism. EXAM: CT ANGIOGRAPHY  CHEST CT ABDOMEN AND PELVIS WITH CONTRAST TECHNIQUE: Multidetector CT imaging of the chest was performed using the standard protocol during bolus administration of intravenous contrast. Multiplanar CT image reconstructions and MIPs were obtained to evaluate the vascular anatomy. Multidetector CT imaging of the abdomen and pelvis was performed using the standard protocol during bolus administration of intravenous contrast. CONTRAST:  < 100 cc > ISOVUE-370 IOPAMIDOL (ISOVUE-370) INJECTION 76% COMPARISON:  03/09/2017 CT chest and abdomen. Chest radiograph from earlier today. 10/15/2016 CT abdomen/pelvis. FINDINGS: CTA CHEST FINDINGS Cardiovascular: The study is moderate quality for the evaluation of pulmonary embolism, with evaluation of the subsegmental vessels limited by motion. There are no filling defects in the central, lobar, segmental or subsegmental pulmonary artery branches to suggest acute pulmonary embolism. Mildly atherosclerotic nonaneurysmal thoracic aorta. Normal caliber pulmonary arteries. Normal heart size. No significant pericardial fluid/thickening. Right internal jugular MediPort terminates in the right atrium. Mediastinum/Nodes: No discrete thyroid nodules. Unremarkable esophagus. No axillary adenopathy. Newly enlarged 2.0 cm right supraclavicular node (series 7/image 8). Increased right paratracheal adenopathy measuring up to 2.1 cm (series 7/ image 24), previously 1.2 cm. Increased prevascular bilateral mediastinal adenopathy measuring up to 2.0 cm on the right (series 7/ image 26), previously 1.3 cm. Increased 1.9 cm enlarged subcarinal node (series 7/ image 42), previously 1.1 cm. Newly enlarged 1.8 cm AP window node (series 7/ image 35). Newly moderate infiltrative left hilar adenopathy measuring up to 2.0 cm (series 7/ image 48), with associated extrinsic narrowing of segmental lingular and left lower lobe airways. No right hilar adenopathy. Lungs/Pleura: No pneumothorax. No right pleural  effusion. New small dependent left pleural effusion. Mild centrilobular emphysema with mild diffuse bronchial wall thickening. Stable bandlike consolidation in the anterior left upper lobe compatible with radiation fibrosis. Stable irregular solid 1.5 cm left upper lobe pulmonary nodule (series 13/image 40). New patchy consolidation, parenchymal banding and nodularity in the posterior left upper lobe and anterior left lower lobe. A few new scattered solid pulmonary nodules in the right lung, largest 6 mm in the anterior right middle lobe (series 13/ image 76). Musculoskeletal: No aggressive appearing focal osseous lesions. Moderate thoracic spondylosis. Review of the MIP images confirms the above findings. CT ABDOMEN and PELVIS FINDINGS Hepatobiliary: New hepatomegaly. Innumerable hypoenhancing masses replacing much of the liver, significantly increased in size and number, for example a 2.0 cm superior liver mass (series 4/ image 15), increased from 0.7 cm. Normal gallbladder with no radiopaque cholelithiasis. No biliary ductal dilatation. Pancreas: Normal, with no mass or duct dilation. Spleen: Normal size. No mass. Adrenals/Urinary Tract: Newly apparent 1.4 cm left adrenal nodule with density 64 HU. No right adrenal nodule. No hydronephrosis. Subcentimeter hypodense  right renal cortical lesion is too small to characterize and is stable. No new renal lesions. Normal bladder. Stomach/Bowel: Normal non-distended stomach. Normal appendix. Mild sigmoid diverticulosis. There is a gas containing 6.0 x 4.9 x 7.2 cm multilocular abscess in the ventral left lower abdomen (series 4/image 66) with thick enhancing wall, which appears to demonstrate a fistulous connection to the proximal sigmoid colon (series 15/image 70), and which is intimately associated with multiple small bowel loops. No small bowel dilatation. No focal small bowel caliber transition. No definite small bowel wall thickening. Vascular/Lymphatic:  Atherosclerotic nonaneurysmal abdominal aorta. Patent portal, splenic, hepatic and renal veins. No pathologically enlarged lymph nodes in the abdomen or pelvis. Reproductive: Status post hysterectomy, with no abnormal findings at the vaginal cuff. No adnexal mass. Other: No ascites. Musculoskeletal: Previously visualized faintly sclerotic lesions throughout the lumbar vertebral bodies are not discretely visualized on today's scan. Mild lumbar spondylosis. Review of the MIP images confirms the above findings. IMPRESSION: 1. No evidence of pulmonary embolism. 2. Complex gas-containing multilocular abscess in the anterior left lower abdomen with fistulous connection to the proximal sigmoid colon (suggesting the sequela of perforated diverticulitis), intimately associated with multiple small bowel loops. No evidence of bowel obstruction. 3. Significant progression of metastatic disease. Right supraclavicular, bilateral mediastinal and left hilar adenopathy is significantly increased. 4. New extrinsic narrowing of segmental left upper and left lower lobe airways by the infiltrative left hilar adenopathy. New patchy consolidation and nodularity in the posterior left upper lobe and anterior left lower lobe, favor a combination of postobstructive pneumonia and tumor. New scattered probable small pulmonary metastases in the right lung. 5. Liver metastases are significantly increased in size and number with new hepatomegaly. 6. New left adrenal metastasis. 7. New small dependent left pleural effusion. 8. Aortic Atherosclerosis (ICD10-I70.0) and Emphysema (ICD10-J43.9). Electronically Signed   By: Ilona Sorrel M.D.   On: 04/24/2017 18:01    EKG: Independently reviewed.  Assessment/Plan Principal Problem:   Diverticulitis of large intestine with perforation and abscess Active Problems:   Primary small cell carcinoma of left lung ([name])   Transaminitis   Postobstructive pneumonia   1. Diverticulitis of sigmoid colon  with perf and abscess - 1. Surgery consult 2. Will keep patient NPO except sips with meds until consult 3. IVF 4. Zosyn / vanc 5. Morphine PRN pain 6. BCx pending 7. Repeat CBC / CMP in AM 2. Metastatic SCLC with worsening disease - 1. IP consult to oncology in AM 3. Possible post obstructive PNA - 1. ABx as above 4. Transaminitis -  1. Cholestatic picture on labs 2. Likely due to metastatic disease in liver 3. Repeat CMP in AM  DVT prophylaxis: SCDs - surgery? Code Status: Full Family Communication: No family in room Disposition Plan: Home after admit Consults called: Dr. Excell Seltzer with gen surg Admission status: Admit to inpatient   Etta Quill DO [facility] Pager [phone]  If 7AM-7PM, please contact day team taking care of patient [email or web address] Password [facility]  04/24/2017, 9:13 PM

## 2017-04-24 NOTE — Progress Notes (Signed)
Pharmacy Note    A consult was received from an ED physician for vancomycin and Zosyn per pharmacy dosing.  The patient's profile has been reviewed for ht/wt/allergies/indication/available labs.   A one time order has been placed for vancomycin 1500 mg IV x1 and Zosyn 3.375 gr IV x1 .  Further antibiotics/pharmacy consults should be ordered by admitting physician if indicated.                       Thank you,  Royetta Asal, PharmD, BCPS Pager [phone] 04/24/2017 5:16 PM

## 2017-04-25 ENCOUNTER — Other Ambulatory Visit: Payer: Self-pay

## 2017-04-25 ENCOUNTER — Inpatient Hospital Stay (HOSPITAL_COMMUNITY): Payer: 59

## 2017-04-25 ENCOUNTER — Encounter (HOSPITAL_COMMUNITY): Payer: Self-pay

## 2017-04-25 LAB — COMPREHENSIVE METABOLIC PANEL
ALBUMIN: 2.4 g/dL — AB (ref 3.5–5.0)
ALK PHOS: 447 U/L — AB (ref 38–126)
ALT: 37 U/L (ref 14–54)
ANION GAP: 9 (ref 5–15)
AST: 247 U/L — ABNORMAL HIGH (ref 15–41)
BUN: 16 mg/dL (ref 6–20)
CALCIUM: 9.6 mg/dL (ref 8.9–10.3)
CO2: 24 mmol/L (ref 22–32)
Chloride: 101 mmol/L (ref 101–111)
Creatinine, Ser: 0.89 mg/dL (ref 0.44–1.00)
GFR calc non Af Amer: >60 mL/min (ref >60)
Glucose, Bld: 89 mg/dL (ref 65–99)
POTASSIUM: 3.7 mmol/L (ref 3.5–5.1)
SODIUM: 134 mmol/L — AB (ref 135–145)
TOTAL PROTEIN: 6.3 g/dL — AB (ref 6.5–8.1)
Total Bilirubin: 4.3 mg/dL — ABNORMAL HIGH (ref 0.3–1.2)

## 2017-04-25 LAB — CBC
HCT: 23.9 % — ABNORMAL LOW (ref 36.0–46.0)
HEMOGLOBIN: 8.3 g/dL — AB (ref 12.0–15.0)
MCH: 36.1 pg — AB (ref 26.0–34.0)
MCHC: 34.7 g/dL (ref 30.0–36.0)
MCV: 103.9 fL — ABNORMAL HIGH (ref 78.0–100.0)
PLATELETS: 145 10*3/uL — AB (ref 150–400)
RBC: 2.3 MIL/uL — ABNORMAL LOW (ref 3.87–5.11)
RDW: 18.2 % — AB (ref 11.5–15.5)
WBC: 15.4 10*3/uL — ABNORMAL HIGH (ref 4.0–10.5)

## 2017-04-25 LAB — HIV ANTIBODY (ROUTINE TESTING W REFLEX): HIV SCREEN 4TH GENERATION: NONREACTIVE

## 2017-04-25 LAB — PROTIME-INR
INR: 1.15
PROTHROMBIN TIME: 14.6 s (ref 11.4–15.2)

## 2017-04-25 LAB — MRSA PCR SCREENING: MRSA by PCR: NEGATIVE

## 2017-04-25 MED ORDER — LIDOCAINE-EPINEPHRINE (PF) 2 %-1:200000 IJ SOLN
INTRAMUSCULAR | Status: AC | PRN
  Administered 2017-04-25: 20 mL

## 2017-04-25 MED ORDER — FENTANYL CITRATE (PF) 100 MCG/2ML IJ SOLN
INTRAMUSCULAR | Status: AC
  Filled 2017-04-25: qty 2

## 2017-04-25 MED ORDER — MIDAZOLAM HCL 2 MG/2ML IJ SOLN
INTRAMUSCULAR | Status: AC | PRN
  Administered 2017-04-25 (×2): 1 mg via INTRAVENOUS

## 2017-04-25 MED ORDER — FENTANYL CITRATE (PF) 100 MCG/2ML IJ SOLN
INTRAMUSCULAR | Status: AC | PRN
  Administered 2017-04-25 (×2): 50 ug via INTRAVENOUS

## 2017-04-25 MED ORDER — SODIUM CHLORIDE 0.9% FLUSH
10.0000 mL | INTRAVENOUS | Status: DC | PRN
Start: 2017-04-25 — End: 2017-05-04
  Administered 2017-04-27 – 2017-04-29 (×2): 10 mL
  Filled 2017-04-25 (×2): qty 40

## 2017-04-25 MED ORDER — MIDAZOLAM HCL 2 MG/2ML IJ SOLN
INTRAMUSCULAR | Status: AC
  Filled 2017-04-25: qty 4

## 2017-04-25 MED ORDER — SODIUM CHLORIDE 0.9% FLUSH
5.0000 mL | Freq: Three times a day (TID) | INTRAVENOUS | Status: DC
  Administered 2017-04-26 – 2017-05-04 (×16): 5 mL via INTRAVENOUS

## 2017-04-25 NOTE — Progress Notes (Signed)
Subjective/Chief Complaint: Complains of some abd pain   Objective: Vital signs in last 24 hours: Temp:  [98.4 F (36.9 C)-98.7 F (37.1 C)] 98.7 F (37.1 C) (12/31 0645) Pulse Rate:  [109-132] 109 (12/31 0645) Resp:  [16-33] 20 (12/31 0645) BP: (102-128)/(55-76) 105/55 (12/31 0645) SpO2:  [91 %-97 %] 93 % (12/31 0840) FiO2 (%):  [28 %] 28 % (12/30 2147)    Intake/Output from previous day: 12/30 0701 - 12/31 0700 In: 1050 [IV Piggyback:1050] Out: 1050 [Urine:1050] Intake/Output this shift: No intake/output data recorded.  General appearance: alert and cooperative Resp: diminished breath sounds bilaterally Cardio: regular rate and rhythm GI: soft, tender. distended  Lab Results:  Recent Labs    04/24/17 1544 04/25/17 0459  WBC 19.8* 15.4*  HGB 10.2* 8.3*  HCT 29.2* 23.9*  PLT 176 145*   BMET Recent Labs    04/24/17 1544 04/25/17 0459  NA 132* 134*  K 3.6 3.7  CL 93* 101  CO2 25 24  GLUCOSE 131* 89  BUN 17 16  CREATININE 0.91 0.89  CALCIUM 10.2 9.6   PT/INR No results for input(s): LABPROT, INR in the last 72 hours. ABG No results for input(s): PHART, HCO3 in the last 72 hours.  Invalid input(s): PCO2, PO2  Studies/Results: Dg Chest 2 View  Result Date: 04/24/2017 CLINICAL DATA:  Shortness of breath. All on immunotherapy for lung cancer. Ex-smoker. EXAM: CHEST  2 VIEW COMPARISON:  CT 03/09/2017.  Plain film of 01/09/2017. FINDINGS: Lateral view degraded by patient arm position. Right Port-A-Cath terminates at the low SVC. Midline trachea. Normal heart size. New small left pleural effusion. Probable treatment effects in the left upper lobe laterally. Clear right lung. Left base subsegmental atelectasis. IMPRESSION: New small left pleural effusion with adjacent left base subsegmental atelectasis. Electronically Signed   By: Abigail Miyamoto M.D.   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 History of palliative radiotherapy of an enlarging left upper lobe pulmonary nodule. Ongoing medical therapy. Patient presents with generalized abdominal pain and clinical concern for pulmonary embolism. EXAM: CT ANGIOGRAPHY CHEST CT ABDOMEN AND PELVIS WITH CONTRAST TECHNIQUE: Multidetector CT imaging of the chest was performed using the standard protocol during bolus administration of intravenous contrast. Multiplanar CT image reconstructions and MIPs were obtained to evaluate the vascular anatomy. Multidetector CT imaging of the abdomen and pelvis was performed using the standard protocol during bolus administration of intravenous contrast. CONTRAST:  < 100 cc > ISOVUE-370 IOPAMIDOL (ISOVUE-370) INJECTION 76% COMPARISON:  03/09/2017 CT chest and abdomen. Chest radiograph from earlier today. 10/15/2016 CT abdomen/pelvis. FINDINGS: CTA CHEST FINDINGS Cardiovascular: The study is moderate quality for the evaluation of pulmonary embolism, with evaluation of the subsegmental vessels limited by motion. There are no filling defects in the central, lobar, segmental or subsegmental pulmonary artery branches to suggest acute pulmonary embolism. Mildly atherosclerotic nonaneurysmal thoracic aorta. Normal caliber pulmonary arteries. Normal heart size. No significant pericardial fluid/thickening. Right internal jugular MediPort terminates in the right atrium. Mediastinum/Nodes: No discrete thyroid nodules. Unremarkable esophagus. No axillary adenopathy. Newly enlarged 2.0 cm right supraclavicular node (series 7/image 8). Increased right paratracheal adenopathy measuring up to 2.1 cm (series 7/ image 24), previously 1.2 cm. Increased prevascular bilateral mediastinal adenopathy measuring up to 2.0 cm on the right (series 7/ image 26), previously 1.3 cm. Increased 1.9 cm enlarged subcarinal node (  series 7/ image 42), previously 1.1 cm.  Newly enlarged 1.8 cm AP window node (series 7/ image 35). Newly moderate infiltrative left hilar adenopathy measuring up to 2.0 cm (series 7/ image 48), with associated extrinsic narrowing of segmental lingular and left lower lobe airways. No right hilar adenopathy. Lungs/Pleura: No pneumothorax. No right pleural effusion. New small dependent left pleural effusion. Mild centrilobular emphysema with mild diffuse bronchial wall thickening. Stable bandlike consolidation in the anterior left upper lobe compatible with radiation fibrosis. Stable irregular solid 1.5 cm left upper lobe pulmonary nodule (series 13/image 40). New patchy consolidation, parenchymal banding and nodularity in the posterior left upper lobe and anterior left lower lobe. A few new scattered solid pulmonary nodules in the right lung, largest 6 mm in the anterior right middle lobe (series 13/ image 76). Musculoskeletal: No aggressive appearing focal osseous lesions. Moderate thoracic spondylosis. Review of the MIP images confirms the above findings. CT ABDOMEN and PELVIS FINDINGS Hepatobiliary: New hepatomegaly. Innumerable hypoenhancing masses replacing much of the liver, significantly increased in size and number, for example a 2.0 cm superior liver mass (series 4/ image 15), increased from 0.7 cm. Normal gallbladder with no radiopaque cholelithiasis. No biliary ductal dilatation. Pancreas: Normal, with no mass or duct dilation. Spleen: Normal size. No mass. Adrenals/Urinary Tract: Newly apparent 1.4 cm left adrenal nodule with density 64 HU. No right adrenal nodule. No hydronephrosis. Subcentimeter hypodense right renal cortical lesion is too small to characterize and is stable. No new renal lesions. Normal bladder. Stomach/Bowel: Normal non-distended stomach. Normal appendix. Mild sigmoid diverticulosis. There is a gas containing 6.0 x 4.9 x 7.2 cm multilocular abscess in the ventral left lower abdomen (series 4/image 66) with thick enhancing  wall, which appears to demonstrate a fistulous connection to the proximal sigmoid colon (series 15/image 70), and which is intimately associated with multiple small bowel loops. No small bowel dilatation. No focal small bowel caliber transition. No definite small bowel wall thickening. Vascular/Lymphatic: Atherosclerotic nonaneurysmal abdominal aorta. Patent portal, splenic, hepatic and renal veins. No pathologically enlarged lymph nodes in the abdomen or pelvis. Reproductive: Status post hysterectomy, with no abnormal findings at the vaginal cuff. No adnexal mass. Other: No ascites. Musculoskeletal: Previously visualized faintly sclerotic lesions throughout the lumbar vertebral bodies are not discretely visualized on today's scan. Mild lumbar spondylosis. Review of the MIP images confirms the above findings. IMPRESSION: 1. No evidence of pulmonary embolism. 2. Complex gas-containing multilocular abscess in the anterior left lower abdomen with fistulous connection to the proximal sigmoid colon (suggesting the sequela of perforated diverticulitis), intimately associated with multiple small bowel loops. No evidence of bowel obstruction. 3. Significant progression of metastatic disease. Right supraclavicular, bilateral mediastinal and left hilar adenopathy is significantly increased. 4. New extrinsic narrowing of segmental left upper and left lower lobe airways by the infiltrative left hilar adenopathy. New patchy consolidation and nodularity in the posterior left upper lobe and anterior left lower lobe, favor a combination of postobstructive pneumonia and tumor. New scattered probable small pulmonary metastases in the right lung. 5. Liver metastases are significantly increased in size and number with new hepatomegaly. 6. New left adrenal metastasis. 7. New small dependent left pleural effusion. 8. Aortic Atherosclerosis (ICD10-I70.0) and Emphysema (ICD10-J43.9). Electronically Signed   By: Ilona Sorrel M.D.   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 History of palliative radiotherapy of an  enlarging left upper lobe pulmonary nodule. Ongoing medical therapy. Patient presents with generalized abdominal pain and clinical concern for pulmonary embolism. EXAM: CT ANGIOGRAPHY CHEST CT ABDOMEN AND PELVIS WITH CONTRAST TECHNIQUE: Multidetector CT imaging of the chest was performed using the standard protocol during bolus administration of intravenous contrast. Multiplanar CT image reconstructions and MIPs were obtained to evaluate the vascular anatomy. Multidetector CT imaging of the abdomen and pelvis was performed using the standard protocol during bolus administration of intravenous contrast. CONTRAST:  < 100 cc > ISOVUE-370 IOPAMIDOL (ISOVUE-370) INJECTION 76% COMPARISON:  03/09/2017 CT chest and abdomen. Chest radiograph from earlier today. 10/15/2016 CT abdomen/pelvis. FINDINGS: CTA CHEST FINDINGS Cardiovascular: The study is moderate quality for the evaluation of pulmonary embolism, with evaluation of the subsegmental vessels limited by motion. There are no filling defects in the central, lobar, segmental or subsegmental pulmonary artery branches to suggest acute pulmonary embolism. Mildly atherosclerotic nonaneurysmal thoracic aorta. Normal caliber pulmonary arteries. Normal heart size. No significant pericardial fluid/thickening. Right internal jugular MediPort terminates in the right atrium. Mediastinum/Nodes: No discrete thyroid nodules. Unremarkable esophagus. No axillary adenopathy. Newly enlarged 2.0 cm right supraclavicular node (series 7/image 8). Increased right paratracheal adenopathy measuring up to 2.1 cm (series 7/ image 24), previously 1.2 cm. Increased prevascular bilateral mediastinal adenopathy measuring up to 2.0 cm on the right (series 7/ image 26), previously  1.3 cm. Increased 1.9 cm enlarged subcarinal node (series 7/ image 42), previously 1.1 cm. Newly enlarged 1.8 cm AP window node (series 7/ image 35). Newly moderate infiltrative left hilar adenopathy measuring up to 2.0 cm (series 7/ image 48), with associated extrinsic narrowing of segmental lingular and left lower lobe airways. No right hilar adenopathy. Lungs/Pleura: No pneumothorax. No right pleural effusion. New small dependent left pleural effusion. Mild centrilobular emphysema with mild diffuse bronchial wall thickening. Stable bandlike consolidation in the anterior left upper lobe compatible with radiation fibrosis. Stable irregular solid 1.5 cm left upper lobe pulmonary nodule (series 13/image 40). New patchy consolidation, parenchymal banding and nodularity in the posterior left upper lobe and anterior left lower lobe. A few new scattered solid pulmonary nodules in the right lung, largest 6 mm in the anterior right middle lobe (series 13/ image 76). Musculoskeletal: No aggressive appearing focal osseous lesions. Moderate thoracic spondylosis. Review of the MIP images confirms the above findings. CT ABDOMEN and PELVIS FINDINGS Hepatobiliary: New hepatomegaly. Innumerable hypoenhancing masses replacing much of the liver, significantly increased in size and number, for example a 2.0 cm superior liver mass (series 4/ image 15), increased from 0.7 cm. Normal gallbladder with no radiopaque cholelithiasis. No biliary ductal dilatation. Pancreas: Normal, with no mass or duct dilation. Spleen: Normal size. No mass. Adrenals/Urinary Tract: Newly apparent 1.4 cm left adrenal nodule with density 64 HU. No right adrenal nodule. No hydronephrosis. Subcentimeter hypodense right renal cortical lesion is too small to characterize and is stable. No new renal lesions. Normal bladder. Stomach/Bowel: Normal non-distended stomach. Normal appendix. Mild sigmoid diverticulosis. There is a gas containing 6.0 x 4.9 x 7.2 cm  multilocular abscess in the ventral left lower abdomen (series 4/image 66) with thick enhancing wall, which appears to demonstrate a fistulous connection to the proximal sigmoid colon (series 15/image 70), and which is intimately associated with multiple small bowel loops. No small bowel dilatation. No focal small bowel caliber transition. No definite small bowel wall thickening. Vascular/Lymphatic: Atherosclerotic nonaneurysmal abdominal aorta. Patent portal, splenic, hepatic and renal veins. No pathologically enlarged lymph nodes in the abdomen or pelvis. Reproductive:  Status post hysterectomy, with no abnormal findings at the vaginal cuff. No adnexal mass. Other: No ascites. Musculoskeletal: Previously visualized faintly sclerotic lesions throughout the lumbar vertebral bodies are not discretely visualized on today's scan. Mild lumbar spondylosis. Review of the MIP images confirms the above findings. IMPRESSION: 1. No evidence of pulmonary embolism. 2. Complex gas-containing multilocular abscess in the anterior left lower abdomen with fistulous connection to the proximal sigmoid colon (suggesting the sequela of perforated diverticulitis), intimately associated with multiple small bowel loops. No evidence of bowel obstruction. 3. Significant progression of metastatic disease. Right supraclavicular, bilateral mediastinal and left hilar adenopathy is significantly increased. 4. New extrinsic narrowing of segmental left upper and left lower lobe airways by the infiltrative left hilar adenopathy. New patchy consolidation and nodularity in the posterior left upper lobe and anterior left lower lobe, favor a combination of postobstructive pneumonia and tumor. New scattered probable small pulmonary metastases in the right lung. 5. Liver metastases are significantly increased in size and number with new hepatomegaly. 6. New left adrenal metastasis. 7. New small dependent left pleural effusion. 8. Aortic Atherosclerosis  (ICD10-I70.0) and Emphysema (ICD10-J43.9). Electronically Signed   By: Ilona Sorrel M.D.   On: 04/24/2017 18:01    Anti-infectives: Anti-infectives (From admission, onward)   Start     Dose/Rate Route Frequency Ordered Stop   04/25/17 1800  vancomycin (VANCOCIN) IVPB 1000 mg/200 mL premix     1,000 mg 200 mL/hr over 60 Minutes Intravenous Every 24 hours 04/24/17 2024     04/24/17 2359  piperacillin-tazobactam (ZOSYN) IVPB 3.375 g     3.375 g 12.5 mL/hr over 240 Minutes Intravenous Every 8 hours 04/24/17 2024     04/24/17 1715  piperacillin-tazobactam (ZOSYN) IVPB 3.375 g     3.375 g 100 mL/hr over 30 Minutes Intravenous  Once 04/24/17 1714 04/24/17 1815   04/24/17 1715  vancomycin (VANCOCIN) 1,500 mg in sodium chloride 0.9 % 500 mL IVPB     1,500 mg 250 mL/hr over 120 Minutes Intravenous  Once 04/24/17 1714 04/24/17 2010      Assessment/Plan: s/p * No surgery found * agree with abx for diverticular abscess  Will need IR to see for possible perc drain Will follow  LOS: 1 day    TOTH III,PAUL S 04/25/2017

## 2017-04-25 NOTE — Progress Notes (Addendum)
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  Currently on immunotherapy.  Patient presented to the emergency department with complaints of abdominal pain.  CT chest/abdomen/pelvis done in the emergency department showed perforated diverticulitis with abscess with fistula and progression of cancer. 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  IR performed drainage of the abscess.  Continue antibiotics. Keep patient n.p.o.  Continue IV fluids.  On vancomycin and Zosyn. We will follow blood cultures.  Pain medications as necessary  CT image: Complex gas-containing multilocular abscess in the anterior left lower abdomen with fistulous connection to the proximal sigmoid colon (suggesting the sequela of perforated diverticulitis), intimately associated with multiple small bowel loops. No evidence of bowel obstruction. Significant progression of metastatic disease. Right supraclavicular, bilateral mediastinal and left hilar adenopathy is significantly increased. New extrinsic narrowing of segmental left upper and left lower lobe airways by the infiltrative left hilar adenopathy. New patchy consolidation and nodularity in the posterior left upper lobe and anterior left lower lobe, favor a combination of postobstructive pneumonia and tumor. New scattered probable small pulmonary metastases in the right lung. Liver metastases are significantly increased in size and number with new hepatomegaly.  Metastatic small cell lung cancer with worsening  disease: We will consult oncology as appropriate.Follows with Dr. Julien Nordmann.Tried to call Dr. Julien Nordmann today but could not be contacted.  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 Her metastatic disease looks to have progressed.  Patient wants to discuss with her kids about her code status.  We will consider palliative consult also.  Possible postoperative pneumonia: Continue antibiotics as above  Transaminitis: Cholestatic picture.  Likely secondary to metastatic disease in liver.  We will continue to monitor CMP.She has mild elevation of AST on baseline      DVT prophylaxis: SCD Code Status: Full Family Communication: None Disposition Plan: Home   Consultants: Surgery  Procedures:+ IR drainage of diverticular abscess  Antimicrobials:Vancomycin and Zosyn since 04/24/17  Subjective: Patient seen and examined the bedside this afternoon.  She underwent IR drainage of diverticular abscess.  She is very weak but is feeling better slowly in comparison to yesterday.  Objective: Vitals:   04/25/17 1343 04/25/17 1400 04/25/17 1405 04/25/17 1410  BP: 114/73 101/69 106/60 96/66  Pulse: (!) 107 (!) 110 (!) 101 (!) 103  Resp: 20 (!) 23 (!) 21 (!) 21  Temp:      TempSrc:      SpO2: 95% 93% 95% 95%    Intake/Output Summary (Last 24 hours) at 04/25/2017 1444 Last data filed at 04/25/2017 1410 Gross per 24 hour  Intake 1050 ml  Output 1385 ml  Net -335 ml   There were no vitals filed for this visit.  Examination:  General exam: Generalized weakness ,chronically ill,Not in distress,average built Respiratory system: Bilateral equal air entry, normal vesicular breath sounds, no  wheezes or crackles    Cardiovascular system: S1 & S2 heard, RRR. No JVD, murmurs, rubs, gallops or clicks. No pedal edema, Port-A-Cath on the right chest Gastrointestinal system: Abdomen is mildly distended, soft and generalized tenderness Port-A-Cath on the right chest. No organomegaly or masses felt. Normal bowel sounds heard. Central nervous system: Alert and oriented. No focal neurological deficits. Extremities: No edema, no clubbing ,no cyanosis, distal peripheral pulses palpable. Skin: No rashes, lesions or ulcers,no icterus ,no pallor Psychiatry: Judgement and insight appear normal. Mood & affect appropriate.     Data Reviewed: I have personally reviewed following labs and imaging studies  CBC: Recent Labs  Lab 04/24/17 1544 04/25/17 0459  WBC 19.8* 15.4*  NEUTROABS 17.6*  --   HGB 10.2* 8.3*  HCT 29.2* 23.9*  MCV 102.5* 103.9*  PLT 176 096*   Basic Metabolic Panel: Recent Labs  Lab 04/24/17 1544 04/25/17 0459  NA 132* 134*  K 3.6 3.7  CL 93* 101  CO2 25 24  GLUCOSE 131* 89  BUN 17 16  CREATININE 0.91 0.89  CALCIUM 10.2 9.6   GFR: Estimated Creatinine Clearance: 72.8 mL/min (by C-G formula based on SCr of 0.89 mg/dL). Liver Function Tests: Recent Labs  Lab 04/24/17 1544 04/25/17 0459  AST 290* 247*  ALT 46 37  ALKPHOS 559* 447*  BILITOT 4.7* 4.3*  PROT 6.8 6.3*  ALBUMIN 2.7* 2.4*   Recent Labs  Lab 04/24/17 1544  LIPASE 20   No results for input(s): AMMONIA in the last 168 hours. Coagulation Profile: Recent Labs  Lab 04/25/17 1027  INR 1.15   Cardiac Enzymes: No results for input(s): CKTOTAL, CKMB, CKMBINDEX, TROPONINI in the last 168 hours. BNP (last 3 results) No results for input(s): PROBNP in the last 8760 hours. HbA1C: No results for input(s): HGBA1C in the last 72 hours. CBG: No results for input(s): GLUCAP in the last 168 hours. Lipid Profile: No results for input(s): CHOL, HDL, LDLCALC, TRIG, CHOLHDL, LDLDIRECT in the last 72 hours. Thyroid Function  Tests: No results for input(s): TSH, T4TOTAL, FREET4, T3FREE, THYROIDAB in the last 72 hours. Anemia Panel: No results for input(s): VITAMINB12, FOLATE, FERRITIN, TIBC, IRON, RETICCTPCT in the last 72 hours. Sepsis Labs: Recent Labs  Lab 04/24/17 1754  LATICACIDVEN 1.27    Recent Results (from the past 240 hour(s))  Blood culture (routine x 2)     Status: None (Preliminary result)   Collection Time: 04/24/17  5:00 PM  Result Value Ref Range Status   Specimen Description BLOOD CHEST PORTA CATH  Final   Special Requests   Final    BOTTLES DRAWN AEROBIC AND ANAEROBIC Blood Culture adequate volume   Culture   Final    NO GROWTH < 24 HOURS Performed at [facility], [place] [address], [place], [place] [address]    Report Status PENDING  Incomplete  Blood culture (routine x 2)     Status: None (Preliminary result)   Collection Time: 04/24/17  5:45 PM  Result Value Ref Range Status   Specimen Description BLOOD CHEST PORTA CATH  Final   Special Requests   Final    BOTTLES DRAWN AEROBIC AND ANAEROBIC Blood Culture results may not be optimal due to an excessive volume of blood received in culture bottles   Culture   Final    NO GROWTH < 24 HOURS Performed at [facility], [place] [address], [place],  [address]    Report Status PENDING  Incomplete  Radiology Studies: Dg Chest 2 View  Result Date: 04/24/2017 CLINICAL DATA:  Shortness of breath. All on immunotherapy for lung cancer. Ex-smoker. EXAM: CHEST  2 VIEW COMPARISON:  CT 03/09/2017.  Plain film of 01/09/2017. FINDINGS: Lateral view degraded by patient arm position. Right Port-A-Cath terminates at the low SVC. Midline trachea. Normal heart size. New small left pleural effusion. Probable treatment effects in the left upper lobe laterally. Clear right lung. Left base subsegmental atelectasis. IMPRESSION: New small left pleural effusion with adjacent left base subsegmental atelectasis. Electronically Signed    By: Abigail Miyamoto M.D.   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 History of palliative radiotherapy of an enlarging left upper lobe pulmonary nodule. Ongoing medical therapy. Patient presents with generalized abdominal pain and clinical concern for pulmonary embolism. EXAM: CT ANGIOGRAPHY CHEST CT ABDOMEN AND PELVIS WITH CONTRAST TECHNIQUE: Multidetector CT imaging of the chest was performed using the standard protocol during bolus administration of intravenous contrast. Multiplanar CT image reconstructions and MIPs were obtained to evaluate the vascular anatomy. Multidetector CT imaging of the abdomen and pelvis was performed using the standard protocol during bolus administration of intravenous contrast. CONTRAST:  < 100 cc > ISOVUE-370 IOPAMIDOL (ISOVUE-370) INJECTION 76% COMPARISON:  03/09/2017 CT chest and abdomen. Chest radiograph from earlier today. 10/15/2016 CT abdomen/pelvis. FINDINGS: CTA CHEST FINDINGS Cardiovascular: The study is moderate quality for the evaluation of pulmonary embolism, with evaluation of the subsegmental vessels limited by motion. There are no filling defects in the central, lobar, segmental or subsegmental pulmonary artery branches to suggest acute pulmonary embolism. Mildly atherosclerotic nonaneurysmal thoracic aorta. Normal caliber pulmonary arteries. Normal heart size. No significant pericardial fluid/thickening. Right internal jugular MediPort terminates in the right atrium. Mediastinum/Nodes: No discrete thyroid nodules. Unremarkable esophagus. No axillary adenopathy. Newly enlarged 2.0 cm right supraclavicular node (series 7/image 8). Increased right paratracheal adenopathy measuring up to 2.1 cm (series 7/ image 24), previously 1.2 cm. Increased prevascular bilateral mediastinal adenopathy measuring up to 2.0 cm on the  right (series 7/ image 26), previously 1.3 cm. Increased 1.9 cm enlarged subcarinal node (series 7/ image 42), previously 1.1 cm. Newly enlarged 1.8 cm AP window node (series 7/ image 35). Newly moderate infiltrative left hilar adenopathy measuring up to 2.0 cm (series 7/ image 48), with associated extrinsic narrowing of segmental lingular and left lower lobe airways. No right hilar adenopathy. Lungs/Pleura: No pneumothorax. No right pleural effusion. New small dependent left pleural effusion. Mild centrilobular emphysema with mild diffuse bronchial wall thickening. Stable bandlike consolidation in the anterior left upper lobe compatible with radiation fibrosis. Stable irregular solid 1.5 cm left upper lobe pulmonary nodule (series 13/image 40). New patchy consolidation, parenchymal banding and nodularity in the posterior left upper lobe and anterior left lower lobe. A few new scattered solid pulmonary nodules in the right lung, largest 6 mm in the anterior right middle lobe (series 13/ image 76). Musculoskeletal: No aggressive appearing focal osseous lesions. Moderate thoracic spondylosis. Review of the MIP images confirms the above findings. CT ABDOMEN and PELVIS FINDINGS Hepatobiliary: New hepatomegaly. Innumerable hypoenhancing masses replacing much of the liver, significantly increased in size and number, for example a 2.0 cm superior liver mass (series 4/ image 15), increased from 0.7 cm. Normal gallbladder with no radiopaque cholelithiasis. No biliary ductal dilatation. Pancreas: Normal, with no mass or duct dilation. Spleen: Normal size.  No mass. Adrenals/Urinary Tract: Newly apparent 1.4 cm left adrenal nodule with density 64 HU. No right adrenal nodule. No hydronephrosis. Subcentimeter hypodense right renal cortical lesion is too small to characterize and is stable. No new renal lesions. Normal bladder. Stomach/Bowel: Normal non-distended stomach. Normal appendix. Mild sigmoid diverticulosis. There is a  gas containing 6.0 x 4.9 x 7.2 cm multilocular abscess in the ventral left lower abdomen (series 4/image 66) with thick enhancing wall, which appears to demonstrate a fistulous connection to the proximal sigmoid colon (series 15/image 70), and which is intimately associated with multiple small bowel loops. No small bowel dilatation. No focal small bowel caliber transition. No definite small bowel wall thickening. Vascular/Lymphatic: Atherosclerotic nonaneurysmal abdominal aorta. Patent portal, splenic, hepatic and renal veins. No pathologically enlarged lymph nodes in the abdomen or pelvis. Reproductive: Status post hysterectomy, with no abnormal findings at the vaginal cuff. No adnexal mass. Other: No ascites. Musculoskeletal: Previously visualized faintly sclerotic lesions throughout the lumbar vertebral bodies are not discretely visualized on today's scan. Mild lumbar spondylosis. Review of the MIP images confirms the above findings. IMPRESSION: 1. No evidence of pulmonary embolism. 2. Complex gas-containing multilocular abscess in the anterior left lower abdomen with fistulous connection to the proximal sigmoid colon (suggesting the sequela of perforated diverticulitis), intimately associated with multiple small bowel loops. No evidence of bowel obstruction. 3. Significant progression of metastatic disease. Right supraclavicular, bilateral mediastinal and left hilar adenopathy is significantly increased. 4. New extrinsic narrowing of segmental left upper and left lower lobe airways by the infiltrative left hilar adenopathy. New patchy consolidation and nodularity in the posterior left upper lobe and anterior left lower lobe, favor a combination of postobstructive pneumonia and tumor. New scattered probable small pulmonary metastases in the right lung. 5. Liver metastases are significantly increased in size and number with new hepatomegaly. 6. New left adrenal metastasis. 7. New small dependent left pleural  effusion. 8. Aortic Atherosclerosis (ICD10-I70.0) and Emphysema (ICD10-J43.9). Electronically Signed   By: Ilona Sorrel M.D.   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 History of palliative radiotherapy of an enlarging left upper lobe pulmonary nodule. Ongoing medical therapy. Patient presents with generalized abdominal pain and clinical concern for pulmonary embolism. EXAM: CT ANGIOGRAPHY CHEST CT ABDOMEN AND PELVIS WITH CONTRAST TECHNIQUE: Multidetector CT imaging of the chest was performed using the standard protocol during bolus administration of intravenous contrast. Multiplanar CT image reconstructions and MIPs were obtained to evaluate the vascular anatomy. Multidetector CT imaging of the abdomen and pelvis was performed using the standard protocol during bolus administration of intravenous contrast. CONTRAST:  < 100 cc > ISOVUE-370 IOPAMIDOL (ISOVUE-370) INJECTION 76% COMPARISON:  03/09/2017 CT chest and abdomen. Chest radiograph from earlier today. 10/15/2016 CT abdomen/pelvis. FINDINGS: CTA CHEST FINDINGS Cardiovascular: The study is moderate quality for the evaluation of pulmonary embolism, with evaluation of the subsegmental vessels limited by motion. There are no filling defects in the central, lobar, segmental or subsegmental pulmonary artery branches to suggest acute pulmonary embolism. Mildly atherosclerotic nonaneurysmal thoracic aorta. Normal caliber pulmonary arteries. Normal heart size. No significant pericardial fluid/thickening. Right internal jugular MediPort terminates in the right atrium. Mediastinum/Nodes: No discrete thyroid nodules. Unremarkable esophagus. No axillary adenopathy. Newly enlarged 2.0 cm right supraclavicular node (series 7/image 8). Increased right paratracheal adenopathy measuring up to 2.1 cm (series 7/ image 24), previously  1.2 cm. Increased prevascular  bilateral mediastinal adenopathy measuring up to 2.0 cm on the right (series 7/ image 26), previously 1.3 cm. Increased 1.9 cm enlarged subcarinal node (series 7/ image 42), previously 1.1 cm. Newly enlarged 1.8 cm AP window node (series 7/ image 35). Newly moderate infiltrative left hilar adenopathy measuring up to 2.0 cm (series 7/ image 48), with associated extrinsic narrowing of segmental lingular and left lower lobe airways. No right hilar adenopathy. Lungs/Pleura: No pneumothorax. No right pleural effusion. New small dependent left pleural effusion. Mild centrilobular emphysema with mild diffuse bronchial wall thickening. Stable bandlike consolidation in the anterior left upper lobe compatible with radiation fibrosis. Stable irregular solid 1.5 cm left upper lobe pulmonary nodule (series 13/image 40). New patchy consolidation, parenchymal banding and nodularity in the posterior left upper lobe and anterior left lower lobe. A few new scattered solid pulmonary nodules in the right lung, largest 6 mm in the anterior right middle lobe (series 13/ image 76). Musculoskeletal: No aggressive appearing focal osseous lesions. Moderate thoracic spondylosis. Review of the MIP images confirms the above findings. CT ABDOMEN and PELVIS FINDINGS Hepatobiliary: New hepatomegaly. Innumerable hypoenhancing masses replacing much of the liver, significantly increased in size and number, for example a 2.0 cm superior liver mass (series 4/ image 15), increased from 0.7 cm. Normal gallbladder with no radiopaque cholelithiasis. No biliary ductal dilatation. Pancreas: Normal, with no mass or duct dilation. Spleen: Normal size. No mass. Adrenals/Urinary Tract: Newly apparent 1.4 cm left adrenal nodule with density 64 HU. No right adrenal nodule. No hydronephrosis. Subcentimeter hypodense right renal cortical lesion is too small to characterize and is stable. No new renal lesions. Normal bladder.  Stomach/Bowel: Normal non-distended stomach. Normal appendix. Mild sigmoid diverticulosis. There is a gas containing 6.0 x 4.9 x 7.2 cm multilocular abscess in the ventral left lower abdomen (series 4/image 66) with thick enhancing wall, which appears to demonstrate a fistulous connection to the proximal sigmoid colon (series 15/image 70), and which is intimately associated with multiple small bowel loops. No small bowel dilatation. No focal small bowel caliber transition. No definite small bowel wall thickening. Vascular/Lymphatic: Atherosclerotic nonaneurysmal abdominal aorta. Patent portal, splenic, hepatic and renal veins. No pathologically enlarged lymph nodes in the abdomen or pelvis. Reproductive: Status post hysterectomy, with no abnormal findings at the vaginal cuff. No adnexal mass. Other: No ascites. Musculoskeletal: Previously visualized faintly sclerotic lesions throughout the lumbar vertebral bodies are not discretely visualized on today's scan. Mild lumbar spondylosis. Review of the MIP images confirms the above findings. IMPRESSION: 1. No evidence of pulmonary embolism. 2. Complex gas-containing multilocular abscess in the anterior left lower abdomen with fistulous connection to the proximal sigmoid colon (suggesting the sequela of perforated diverticulitis), intimately associated with multiple small bowel loops. No evidence of bowel obstruction. 3. Significant progression of metastatic disease. Right supraclavicular, bilateral mediastinal and left hilar adenopathy is significantly increased. 4. New extrinsic narrowing of segmental left upper and left lower lobe airways by the infiltrative left hilar adenopathy. New patchy consolidation and nodularity in the posterior left upper lobe and anterior left lower lobe, favor a combination of postobstructive pneumonia and tumor. New scattered probable small pulmonary metastases in the right lung. 5. Liver metastases are significantly increased in size and  number with new hepatomegaly. 6. New left adrenal metastasis. 7. New small dependent left pleural effusion. 8. Aortic Atherosclerosis (ICD10-I70.0) and Emphysema (ICD10-J43.9). Electronically Signed   By: Ilona Sorrel M.D.   On: 04/24/2017 18:01        Scheduled Meds: .  fentaNYL      . ferrous sulfate  325 mg Oral Daily  . fluticasone  1 spray Each Nare Daily  . fluticasone furoate-vilanterol  1 puff Inhalation Daily  . ipratropium-albuterol  3 mL Nebulization Q6H WA  . magnesium oxide  400 mg Oral QID  . midazolam      . montelukast  10 mg Oral QHS  . potassium chloride  20 mEq Oral BID  . senna  1 tablet Oral Daily   Continuous Infusions: . sodium chloride 100 mL/hr at 04/25/17 0008  . piperacillin-tazobactam (ZOSYN)  IV Stopped (04/25/17 1202)  . vancomycin       LOS: 1 day    Time spent: 25 minutes patient seen and examined at the bedside this afternoon.    Marene Lenz, MD [facility] Pager [phone]  If 7PM-7AM, please contact night-coverage [email or web address] Password [email or web address] 04/25/2017, 2:44 PM

## 2017-04-25 NOTE — Procedures (Signed)
Pre procedural Dx: Abdominal abscess Post procedural Dx: Same  Technically successful CT guided placed of a 10 Fr drainage catheter placement into the abscess within the ventral aspect of the left lower abdomen/pelvis yielding 80 cc of purulent material.    All aspirated samples sent to the laboratory for analysis.    EBL: Minimal  Complications: None immediate  Ronny Bacon, MD Pager #: [phone]

## 2017-04-25 NOTE — Consult Note (Signed)
Chief Complaint: diverticular abscess  Referring Physician:Dr. Autumn Messing  Supervising Physician: Sandi Mariscal  Patient Status: [facility] - In-pt  HPI: Sharon [name] is a 61 y.o. female with a history of extensive NSCLC with diffuse metastatic disease.  She is currently on immunotherapy with her last treatment on 04/15/17.  About 4 days ago, she developed abdominal pain that was more significant than her typical abdominal discomfort.  She does admit to some recent emesis, but this isn't uncommon for her either.  She presented to the ED for evaluation and she was found to have progression of her disease along with an area concerning for a diverticular abscess.  She is not a surgical candidate and a drain placement has been requested to try and get her better with conservative management.  Past Medical History:  Past Medical History:  Diagnosis Date  . Cancer of right breast ([place]) 2009  . CAP (community acquired pneumonia) 11/06/2015  . Chemotherapy induced neutropenia ([place]) 12/17/2016  . COPD (chronic obstructive pulmonary disease) ([place])   . Elevated blood pressure   . Encounter for antineoplastic chemotherapy 01/05/2016  . History of radiation therapy 05/04/16-05/18/16   whole brain 25 Gy in 10 fractions  . History of radiation therapy 09/01/16 - 09/21/16   Left Lung treated to 35 Gy in 14 fractions  . Hoarseness of voice    "for the last 2 months" (11/06/2015)  . Hypertension   . Hypokalemia 12/17/2016  . Lung mass dx'd 10/2015  . Menopause   . Personal history of breast cancer 03/29/2016  . Pre-diabetes   . Small cell lung cancer ([place])   . Vitamin D deficiency     Past Surgical History:  Past Surgical History:  Procedure Laterality Date  . BREAST BIOPSY Right 2009  . BREAST LUMPECTOMY Right 2009  . IR FLUORO GUIDE PORT INSERTION RIGHT  02/01/2017  . IR US GUIDE VASC ACCESS RIGHT  02/01/2017  . UTERINE FIBROID SURGERY  2000  . VAGINAL HYSTERECTOMY  2000  . VESICOVAGINAL FISTULA CLOSURE W/  TAH  2000  . VIDEO BRONCHOSCOPY WITH ENDOBRONCHIAL ULTRASOUND N/A 11/10/2015   Procedure: VIDEO BRONCHOSCOPY WITH ENDOBRONCHIAL ULTRASOUND;  Surgeon: Javier Glazier, MD;  Location: [place];  Service: Thoracic;  Laterality: N/A;    Family History:  Family History  Adopted: Yes  Problem Relation Age of Onset  . Cancer Neg Hx     Social History:  reports that she quit smoking about 17 months ago. Her smoking use included cigarettes. She has a 4.60 pack-year smoking history. she has never used smokeless tobacco. She reports that she uses drugs. Drug: Marijuana. She reports that she does not drink alcohol.  Allergies: No Known Allergies  Medications: Medications reviewed in epic  Please HPI for pertinent positives, otherwise complete 10 system ROS negative.  Mallampati Score: MD Evaluation Airway: WNL Heart: WNL Abdomen: Other (comments) Abdomen comments: diffuse tenderness Chest/ Lungs: Other (comments) Chest/ lungs comments: PAC in RUC ASA  Classification: 3 Mallampati/Airway Score: Two  Physical Exam: BP (!) 105/55 (BP Location: Right Arm)   Pulse (!) 109   Temp 98.7 F (37.1 C) (Oral)   Resp 20   SpO2 93%  There is no height or weight on file to calculate BMI. General: pleasant, obese white female who is laying in bed in NAD HEENT: head is normocephalic, atraumatic.  Sclera are noninjected.  PERRL.  Ears and nose without any masses or lesions.  Mouth is pink and moist Heart: regular, rate, and  rhythm.  Normal s1,s2. No obvious murmurs, gallops, or rubs noted.  Palpable radial pulses bilaterally Lungs: CTAB, no wheezes, rhonchi, or rales noted.  Respiratory effort nonlabored.  [place] in place.  PAC in place in RUC Abd: soft, diffuse tenderness, but greatest in upper abdomen, ND, +BS, no masses, hernias, or organomegaly Psych: A&Ox3 with an appropriate affect.   Labs: Results for orders placed or performed during the hospital encounter of 04/24/17 (from the past 48 hour(s))    CBC with Differential     Status: Abnormal   Collection Time: 04/24/17  3:44 PM  Result Value Ref Range   WBC 19.8 (H) 4.0 - 10.5 K/uL   RBC 2.85 (L) 3.87 - 5.11 MIL/uL   Hemoglobin 10.2 (L) 12.0 - 15.0 g/dL   HCT 29.2 (L) 36.0 - 46.0 %   MCV 102.5 (H) 78.0 - 100.0 fL   MCH 35.8 (H) 26.0 - 34.0 pg   MCHC 34.9 30.0 - 36.0 g/dL   RDW 17.6 (H) 11.5 - 15.5 %   Platelets 176 150 - 400 K/uL   Neutrophils Relative % 89 %   Lymphocytes Relative 8 %   Monocytes Relative 3 %   Eosinophils Relative 0 %   Basophils Relative 0 %   Neutro Abs 17.6 (H) 1.7 - 7.7 K/uL   Lymphs Abs 1.6 0.7 - 4.0 K/uL   Monocytes Absolute 0.6 0.1 - 1.0 K/uL   Eosinophils Absolute 0.0 0.0 - 0.7 K/uL   Basophils Absolute 0.0 0.0 - 0.1 K/uL   Smear Review MORPHOLOGY UNREMARKABLE   Comprehensive metabolic panel     Status: Abnormal   Collection Time: 04/24/17  3:44 PM  Result Value Ref Range   Sodium 132 (L) 135 - 145 mmol/L   Potassium 3.6 3.5 - 5.1 mmol/L   Chloride 93 (L) 101 - 111 mmol/L   CO2 25 22 - 32 mmol/L   Glucose, Bld 131 (H) 65 - 99 mg/dL   BUN 17 6 - 20 mg/dL   Creatinine, Ser 0.91 0.44 - 1.00 mg/dL   Calcium 10.2 8.9 - 10.3 mg/dL   Total Protein 6.8 6.5 - 8.1 g/dL   Albumin 2.7 (L) 3.5 - 5.0 g/dL   AST 290 (H) 15 - 41 U/L   ALT 46 14 - 54 U/L   Alkaline Phosphatase 559 (H) 38 - 126 U/L   Total Bilirubin 4.7 (H) 0.3 - 1.2 mg/dL   GFR calc non Af Amer >60 >60 mL/min   GFR calc Af Amer >60 >60 mL/min    Comment: (NOTE) The eGFR has been calculated using the CKD EPI equation. This calculation has not been validated in all clinical situations. eGFR's persistently <60 mL/min signify possible Chronic Kidney Disease.    Anion gap 14 5 - 15  Lipase, blood     Status: None   Collection Time: 04/24/17  3:44 PM  Result Value Ref Range   Lipase 20 11 - 51 U/L  I-Stat Troponin, ED (not at [facility])     Status: None   Collection Time: 04/24/17  3:55 PM  Result Value Ref Range   Troponin i, poc 0.00  0.00 - 0.08 ng/mL   Comment 3            Comment: Due to the release kinetics of cTnI, a negative result within the first hours of the onset of symptoms does not rule out myocardial infarction with certainty. If myocardial infarction is still suspected, repeat the test at appropriate intervals.  I-Stat CG4 Lactic Acid, ED     Status: None   Collection Time: 04/24/17  5:54 PM  Result Value Ref Range   Lactic Acid, Venous 1.27 0.5 - 1.9 mmol/L  Urinalysis, Routine w reflex microscopic     Status: Abnormal   Collection Time: 04/24/17  9:18 PM  Result Value Ref Range   Color, Urine AMBER (A) YELLOW    Comment: BIOCHEMICALS MAY BE AFFECTED BY COLOR   APPearance CLEAR CLEAR   Specific Gravity, Urine 1.020 1.005 - 1.030   pH 5.0 5.0 - 8.0   Glucose, UA NEGATIVE NEGATIVE mg/dL   Hgb urine dipstick NEGATIVE NEGATIVE   Bilirubin Urine SMALL (A) NEGATIVE   Ketones, ur 5 (A) NEGATIVE mg/dL   Protein, ur 30 (A) NEGATIVE mg/dL   Nitrite NEGATIVE NEGATIVE   Leukocytes, UA NEGATIVE NEGATIVE   RBC / HPF 0-5 0 - 5 RBC/hpf   WBC, UA 0-5 0 - 5 WBC/hpf   Bacteria, UA RARE (A) NONE SEEN   Squamous Epithelial / LPF NONE SEEN NONE SEEN   Mucus PRESENT   CBC     Status: Abnormal   Collection Time: 04/25/17  4:59 AM  Result Value Ref Range   WBC 15.4 (H) 4.0 - 10.5 K/uL   RBC 2.30 (L) 3.87 - 5.11 MIL/uL   Hemoglobin 8.3 (L) 12.0 - 15.0 g/dL   HCT 23.9 (L) 36.0 - 46.0 %   MCV 103.9 (H) 78.0 - 100.0 fL   MCH 36.1 (H) 26.0 - 34.0 pg   MCHC 34.7 30.0 - 36.0 g/dL   RDW 18.2 (H) 11.5 - 15.5 %   Platelets 145 (L) 150 - 400 K/uL  Comprehensive metabolic panel     Status: Abnormal   Collection Time: 04/25/17  4:59 AM  Result Value Ref Range   Sodium 134 (L) 135 - 145 mmol/L   Potassium 3.7 3.5 - 5.1 mmol/L   Chloride 101 101 - 111 mmol/L   CO2 24 22 - 32 mmol/L   Glucose, Bld 89 65 - 99 mg/dL   BUN 16 6 - 20 mg/dL   Creatinine, Ser 0.89 0.44 - 1.00 mg/dL   Calcium 9.6 8.9 - 10.3 mg/dL   Total  Protein 6.3 (L) 6.5 - 8.1 g/dL   Albumin 2.4 (L) 3.5 - 5.0 g/dL   AST 247 (H) 15 - 41 U/L   ALT 37 14 - 54 U/L   Alkaline Phosphatase 447 (H) 38 - 126 U/L   Total Bilirubin 4.3 (H) 0.3 - 1.2 mg/dL   GFR calc non Af Amer >60 >60 mL/min   GFR calc Af Amer >60 >60 mL/min    Comment: (NOTE) The eGFR has been calculated using the CKD EPI equation. This calculation has not been validated in all clinical situations. eGFR's persistently <60 mL/min signify possible Chronic Kidney Disease.    Anion gap 9 5 - 15    Imaging: Dg Chest 2 View  Result Date: 04/24/2017 CLINICAL DATA:  Shortness of breath. All on immunotherapy for lung cancer. Ex-smoker. EXAM: CHEST  2 VIEW COMPARISON:  CT 03/09/2017.  Plain film of 01/09/2017. FINDINGS: Lateral view degraded by patient arm position. Right Port-A-Cath terminates at the low SVC. Midline trachea. Normal heart size. New small left pleural effusion. Probable treatment effects in the left upper lobe laterally. Clear right lung. Left base subsegmental atelectasis. IMPRESSION: New small left pleural effusion with adjacent left base subsegmental atelectasis. Electronically Signed   By: Adria Devon.D.  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 History of palliative radiotherapy of an enlarging left upper lobe pulmonary nodule. Ongoing medical therapy. Patient presents with generalized abdominal pain and clinical concern for pulmonary embolism. EXAM: CT ANGIOGRAPHY CHEST CT ABDOMEN AND PELVIS WITH CONTRAST TECHNIQUE: Multidetector CT imaging of the chest was performed using the standard protocol during bolus administration of intravenous contrast. Multiplanar CT image reconstructions and MIPs were obtained to evaluate the vascular anatomy. Multidetector CT imaging of the abdomen and pelvis was performed using the  standard protocol during bolus administration of intravenous contrast. CONTRAST:  < 100 cc > ISOVUE-370 IOPAMIDOL (ISOVUE-370) INJECTION 76% COMPARISON:  03/09/2017 CT chest and abdomen. Chest radiograph from earlier today. 10/15/2016 CT abdomen/pelvis. FINDINGS: CTA CHEST FINDINGS Cardiovascular: The study is moderate quality for the evaluation of pulmonary embolism, with evaluation of the subsegmental vessels limited by motion. There are no filling defects in the central, lobar, segmental or subsegmental pulmonary artery branches to suggest acute pulmonary embolism. Mildly atherosclerotic nonaneurysmal thoracic aorta. Normal caliber pulmonary arteries. Normal heart size. No significant pericardial fluid/thickening. Right internal jugular MediPort terminates in the right atrium. Mediastinum/Nodes: No discrete thyroid nodules. Unremarkable esophagus. No axillary adenopathy. Newly enlarged 2.0 cm right supraclavicular node (series 7/image 8). Increased right paratracheal adenopathy measuring up to 2.1 cm (series 7/ image 24), previously 1.2 cm. Increased prevascular bilateral mediastinal adenopathy measuring up to 2.0 cm on the right (series 7/ image 26), previously 1.3 cm. Increased 1.9 cm enlarged subcarinal node (series 7/ image 42), previously 1.1 cm. Newly enlarged 1.8 cm AP window node (series 7/ image 35). Newly moderate infiltrative left hilar adenopathy measuring up to 2.0 cm (series 7/ image 48), with associated extrinsic narrowing of segmental lingular and left lower lobe airways. No right hilar adenopathy. Lungs/Pleura: No pneumothorax. No right pleural effusion. New small dependent left pleural effusion. Mild centrilobular emphysema with mild diffuse bronchial wall thickening. Stable bandlike consolidation in the anterior left upper lobe compatible with radiation fibrosis. Stable irregular solid 1.5 cm left upper lobe pulmonary nodule (series 13/image 40). New patchy consolidation, parenchymal banding  and nodularity in the posterior left upper lobe and anterior left lower lobe. A few new scattered solid pulmonary nodules in the right lung, largest 6 mm in the anterior right middle lobe (series 13/ image 76). Musculoskeletal: No aggressive appearing focal osseous lesions. Moderate thoracic spondylosis. Review of the MIP images confirms the above findings. CT ABDOMEN and PELVIS FINDINGS Hepatobiliary: New hepatomegaly. Innumerable hypoenhancing masses replacing much of the liver, significantly increased in size and number, for example a 2.0 cm superior liver mass (series 4/ image 15), increased from 0.7 cm. Normal gallbladder with no radiopaque cholelithiasis. No biliary ductal dilatation. Pancreas: Normal, with no mass or duct dilation. Spleen: Normal size. No mass. Adrenals/Urinary Tract: Newly apparent 1.4 cm left adrenal nodule with density 64 HU. No right adrenal nodule. No hydronephrosis. Subcentimeter hypodense right renal cortical lesion is too small to characterize and is stable. No new renal lesions. Normal bladder. Stomach/Bowel: Normal non-distended stomach. Normal appendix. Mild sigmoid diverticulosis. There is a gas containing 6.0 x 4.9 x 7.2 cm multilocular abscess in the ventral left lower abdomen (series 4/image 66) with thick enhancing wall, which appears to demonstrate a fistulous connection to the proximal sigmoid colon (series 15/image 70), and which is intimately associated with multiple small bowel loops.  No small bowel dilatation. No focal small bowel caliber transition. No definite small bowel wall thickening. Vascular/Lymphatic: Atherosclerotic nonaneurysmal abdominal aorta. Patent portal, splenic, hepatic and renal veins. No pathologically enlarged lymph nodes in the abdomen or pelvis. Reproductive: Status post hysterectomy, with no abnormal findings at the vaginal cuff. No adnexal mass. Other: No ascites. Musculoskeletal: Previously visualized faintly sclerotic lesions throughout the  lumbar vertebral bodies are not discretely visualized on today's scan. Mild lumbar spondylosis. Review of the MIP images confirms the above findings. IMPRESSION: 1. No evidence of pulmonary embolism. 2. Complex gas-containing multilocular abscess in the anterior left lower abdomen with fistulous connection to the proximal sigmoid colon (suggesting the sequela of perforated diverticulitis), intimately associated with multiple small bowel loops. No evidence of bowel obstruction. 3. Significant progression of metastatic disease. Right supraclavicular, bilateral mediastinal and left hilar adenopathy is significantly increased. 4. New extrinsic narrowing of segmental left upper and left lower lobe airways by the infiltrative left hilar adenopathy. New patchy consolidation and nodularity in the posterior left upper lobe and anterior left lower lobe, favor a combination of postobstructive pneumonia and tumor. New scattered probable small pulmonary metastases in the right lung. 5. Liver metastases are significantly increased in size and number with new hepatomegaly. 6. New left adrenal metastasis. 7. New small dependent left pleural effusion. 8. Aortic Atherosclerosis (ICD10-I70.0) and Emphysema (ICD10-J43.9). Electronically Signed   By: Ilona Sorrel M.D.   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 History of palliative radiotherapy of an enlarging left upper lobe pulmonary nodule. Ongoing medical therapy. Patient presents with generalized abdominal pain and clinical concern for pulmonary embolism. EXAM: CT ANGIOGRAPHY CHEST CT ABDOMEN AND PELVIS WITH CONTRAST TECHNIQUE: Multidetector CT imaging of the chest was performed using the standard protocol during bolus administration of intravenous contrast. Multiplanar CT image reconstructions and MIPs were obtained to evaluate the  vascular anatomy. Multidetector CT imaging of the abdomen and pelvis was performed using the standard protocol during bolus administration of intravenous contrast. CONTRAST:  < 100 cc > ISOVUE-370 IOPAMIDOL (ISOVUE-370) INJECTION 76% COMPARISON:  03/09/2017 CT chest and abdomen. Chest radiograph from earlier today. 10/15/2016 CT abdomen/pelvis. FINDINGS: CTA CHEST FINDINGS Cardiovascular: The study is moderate quality for the evaluation of pulmonary embolism, with evaluation of the subsegmental vessels limited by motion. There are no filling defects in the central, lobar, segmental or subsegmental pulmonary artery branches to suggest acute pulmonary embolism. Mildly atherosclerotic nonaneurysmal thoracic aorta. Normal caliber pulmonary arteries. Normal heart size. No significant pericardial fluid/thickening. Right internal jugular MediPort terminates in the right atrium. Mediastinum/Nodes: No discrete thyroid nodules. Unremarkable esophagus. No axillary adenopathy. Newly enlarged 2.0 cm right supraclavicular node (series 7/image 8). Increased right paratracheal adenopathy measuring up to 2.1 cm (series 7/ image 24), previously 1.2 cm. Increased prevascular bilateral mediastinal adenopathy measuring up to 2.0 cm on the right (series 7/ image 26), previously 1.3 cm. Increased 1.9 cm enlarged subcarinal node (series 7/ image 42), previously 1.1 cm. Newly enlarged 1.8 cm AP window node (series 7/ image 35). Newly moderate infiltrative left hilar adenopathy measuring up to 2.0 cm (series 7/ image 48), with associated extrinsic narrowing of segmental lingular and left lower lobe airways. No right hilar adenopathy. Lungs/Pleura: No pneumothorax. No right pleural effusion. New small dependent left pleural effusion. Mild centrilobular emphysema with mild diffuse bronchial wall thickening. Stable bandlike consolidation in the anterior left  upper lobe compatible with radiation fibrosis. Stable irregular solid 1.5 cm left upper  lobe pulmonary nodule (series 13/image 40). New patchy consolidation, parenchymal banding and nodularity in the posterior left upper lobe and anterior left lower lobe. A few new scattered solid pulmonary nodules in the right lung, largest 6 mm in the anterior right middle lobe (series 13/ image 76). Musculoskeletal: No aggressive appearing focal osseous lesions. Moderate thoracic spondylosis. Review of the MIP images confirms the above findings. CT ABDOMEN and PELVIS FINDINGS Hepatobiliary: New hepatomegaly. Innumerable hypoenhancing masses replacing much of the liver, significantly increased in size and number, for example a 2.0 cm superior liver mass (series 4/ image 15), increased from 0.7 cm. Normal gallbladder with no radiopaque cholelithiasis. No biliary ductal dilatation. Pancreas: Normal, with no mass or duct dilation. Spleen: Normal size. No mass. Adrenals/Urinary Tract: Newly apparent 1.4 cm left adrenal nodule with density 64 HU. No right adrenal nodule. No hydronephrosis. Subcentimeter hypodense right renal cortical lesion is too small to characterize and is stable. No new renal lesions. Normal bladder. Stomach/Bowel: Normal non-distended stomach. Normal appendix. Mild sigmoid diverticulosis. There is a gas containing 6.0 x 4.9 x 7.2 cm multilocular abscess in the ventral left lower abdomen (series 4/image 66) with thick enhancing wall, which appears to demonstrate a fistulous connection to the proximal sigmoid colon (series 15/image 70), and which is intimately associated with multiple small bowel loops. No small bowel dilatation. No focal small bowel caliber transition. No definite small bowel wall thickening. Vascular/Lymphatic: Atherosclerotic nonaneurysmal abdominal aorta. Patent portal, splenic, hepatic and renal veins. No pathologically enlarged lymph nodes in the abdomen or pelvis. Reproductive: Status post hysterectomy, with no abnormal findings at the vaginal cuff. No adnexal mass. Other: No  ascites. Musculoskeletal: Previously visualized faintly sclerotic lesions throughout the lumbar vertebral bodies are not discretely visualized on today's scan. Mild lumbar spondylosis. Review of the MIP images confirms the above findings. IMPRESSION: 1. No evidence of pulmonary embolism. 2. Complex gas-containing multilocular abscess in the anterior left lower abdomen with fistulous connection to the proximal sigmoid colon (suggesting the sequela of perforated diverticulitis), intimately associated with multiple small bowel loops. No evidence of bowel obstruction. 3. Significant progression of metastatic disease. Right supraclavicular, bilateral mediastinal and left hilar adenopathy is significantly increased. 4. New extrinsic narrowing of segmental left upper and left lower lobe airways by the infiltrative left hilar adenopathy. New patchy consolidation and nodularity in the posterior left upper lobe and anterior left lower lobe, favor a combination of postobstructive pneumonia and tumor. New scattered probable small pulmonary metastases in the right lung. 5. Liver metastases are significantly increased in size and number with new hepatomegaly. 6. New left adrenal metastasis. 7. New small dependent left pleural effusion. 8. Aortic Atherosclerosis (ICD10-I70.0) and Emphysema (ICD10-J43.9). Electronically Signed   By: Ilona Sorrel M.D.   On: 04/24/2017 18:01    Assessment/Plan 1. Diverticular abscess  Dr. Pascal Lux has reviewed her imaging.  In review of her past imaging there is a limited images available, but what is present suggests she may had a process (or this process) going on at that time.  With that thought, it is possible this may be a met and not a fluid collection.  However, given her WBC and acute onset of new symptoms, it is likely that this is an abscess.  Either way, we will plan to bring her down and proceed with a aspiration/drain vs biopsy.  She has been informed that if she gets a drain and she  develops a fistula that the drain would be in indefinitely until it were to heal.  Given she is not a surgical candidate and is on immunotherapy, the fistula may not heal and she would need a drain forever.  She is aware of this possibility. Risks and benefits discussed with the patient including bleeding, infection, damage to adjacent structures, bowel perforation/fistula connection, and sepsis. All of the patient's questions were answered, patient is agreeable to proceed. Consent signed and in chart.  Thank you for this interesting consult.  I greatly enjoyed meeting Sharon [name] and look forward to participating in their care.  A copy of this report was sent to the requesting provider on this date.  Electronically Signed: Henreitta Cea 04/25/2017, 11:16 AM   I spent a total of 40 Minutes    in face to face in clinical consultation, greater than 50% of which was counseling/coordinating care for diverticular abscess

## 2017-04-26 DIAGNOSIS — Z9981 Dependence on supplemental oxygen: Secondary | ICD-10-CM

## 2017-04-26 DIAGNOSIS — Z5112 Encounter for antineoplastic immunotherapy: Secondary | ICD-10-CM

## 2017-04-26 DIAGNOSIS — C787 Secondary malignant neoplasm of liver and intrahepatic bile duct: Secondary | ICD-10-CM

## 2017-04-26 DIAGNOSIS — C7931 Secondary malignant neoplasm of brain: Secondary | ICD-10-CM

## 2017-04-26 DIAGNOSIS — K5720 Diverticulitis of large intestine with perforation and abscess without bleeding: Principal | ICD-10-CM

## 2017-04-26 DIAGNOSIS — C3412 Malignant neoplasm of upper lobe, left bronchus or lung: Secondary | ICD-10-CM

## 2017-04-26 DIAGNOSIS — K651 Peritoneal abscess: Secondary | ICD-10-CM

## 2017-04-26 DIAGNOSIS — Z9221 Personal history of antineoplastic chemotherapy: Secondary | ICD-10-CM

## 2017-04-26 LAB — CBC WITH DIFFERENTIAL/PLATELET
BASOS ABS: 0 10*3/uL (ref 0.0–0.1)
BASOS PCT: 0 %
Eosinophils Absolute: 0.1 10*3/uL (ref 0.0–0.7)
Eosinophils Relative: 1 %
HEMATOCRIT: 23.3 % — AB (ref 36.0–46.0)
Hemoglobin: 8.1 g/dL — ABNORMAL LOW (ref 12.0–15.0)
Lymphocytes Relative: 9 %
Lymphs Abs: 1.3 10*3/uL (ref 0.7–4.0)
MCH: 36.7 pg — ABNORMAL HIGH (ref 26.0–34.0)
MCHC: 34.8 g/dL (ref 30.0–36.0)
MCV: 105.4 fL — ABNORMAL HIGH (ref 78.0–100.0)
MONO ABS: 0.3 10*3/uL (ref 0.1–1.0)
Monocytes Relative: 2 %
NEUTROS ABS: 12.5 10*3/uL — AB (ref 1.7–7.7)
NEUTROS PCT: 88 %
Platelets: 135 10*3/uL — ABNORMAL LOW (ref 150–400)
RBC: 2.21 MIL/uL — ABNORMAL LOW (ref 3.87–5.11)
RDW: 18.4 % — AB (ref 11.5–15.5)
WBC: 14.2 10*3/uL — AB (ref 4.0–10.5)

## 2017-04-26 LAB — COMPREHENSIVE METABOLIC PANEL
ALBUMIN: 2 g/dL — AB (ref 3.5–5.0)
ALT: 32 U/L (ref 14–54)
AST: 258 U/L — AB (ref 15–41)
Alkaline Phosphatase: 446 U/L — ABNORMAL HIGH (ref 38–126)
Anion gap: 8 (ref 5–15)
BILIRUBIN TOTAL: 3.4 mg/dL — AB (ref 0.3–1.2)
BUN: 14 mg/dL (ref 6–20)
CHLORIDE: 102 mmol/L (ref 101–111)
CO2: 25 mmol/L (ref 22–32)
Calcium: 9.6 mg/dL (ref 8.9–10.3)
Creatinine, Ser: 0.97 mg/dL (ref 0.44–1.00)
GFR calc Af Amer: >60 mL/min (ref >60)
GFR calc non Af Amer: >60 mL/min (ref >60)
GLUCOSE: 78 mg/dL (ref 65–99)
POTASSIUM: 3.6 mmol/L (ref 3.5–5.1)
Sodium: 135 mmol/L (ref 135–145)
TOTAL PROTEIN: 5.6 g/dL — AB (ref 6.5–8.1)

## 2017-04-26 LAB — URINE CULTURE: Culture: NO GROWTH

## 2017-04-26 MED ORDER — IPRATROPIUM-ALBUTEROL 0.5-2.5 (3) MG/3ML IN SOLN
3.0000 mL | Freq: Three times a day (TID) | RESPIRATORY_TRACT | Status: DC
  Administered 2017-04-26: 3 mL via RESPIRATORY_TRACT
  Filled 2017-04-26: qty 3

## 2017-04-26 MED ORDER — IPRATROPIUM-ALBUTEROL 0.5-2.5 (3) MG/3ML IN SOLN
3.0000 mL | Freq: Two times a day (BID) | RESPIRATORY_TRACT | Status: DC
  Administered 2017-04-26 – 2017-05-03 (×13): 3 mL via RESPIRATORY_TRACT
  Filled 2017-04-26 (×15): qty 3

## 2017-04-26 NOTE — Progress Notes (Signed)
[facility]  [address] [place], [place] [address] Phone: [phone]  FAX: [phone]      Sharon [name] [id number] 1956-04-17  CARE TEAM:  PCP: [name], PA  Outpatient Care Team: Patient Care Team: [name], [place] as PCP - General Brigitte Pulse Laurey Arrow, MD as Referring Physician (Family Medicine)  Inpatient Treatment Team: Treatment Team: Attending Provider: Marene Lenz, MD; Consulting Physician: Edison Pace, Md, MD; Rounding Team: Fatima Blank, MD; Technician: Leda Quail, NT; Rounding Team: [facility], MD   Problem List:   Principal Problem:   Diverticulitis of large intestine with perforation and abscess Active Problems:   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  Presumed diverticular sigmoid etiology   Assessment  Improving  Plan:  -Advance to pured/full liquid diet.  Hold off on advancing further until has bowel movement.  IV antibiotics.  Follow-up on cultures.  Most likely will keep drain with follow-up outpatient drain study.  Risk of fistula formation rather high in a patient chronically immunosuppressed due to chronic chemotherapy and immunotherapy for her metastatic lung and other cancers.  Chronic transaminitis due to liver metastases and chronic chemo/immunotherapy.  Defer to primary and medical oncology.  Agree with medical oncology input.  Will be challenge to heal infection on chemo/immunotherapy, but do not want to shorten life expectancy if needed for her metastatic cancer  -VTE prophylaxis- SCDs, etc -mobilize as tolerated to help recovery  30 minutes spent in review, evaluation, examination, counseling, and  coordination of care.  More than 50% of that time was spent in counseling.  Adin Hector, M.D., F.A.C.S. Gastrointestinal and Minimally Invasive Surgery [facility] [address] [place], [place] [address] [phone] Main / Paging   04/26/2017    Subjective: (Chief complaint)  Feeling better.  Tolerating liquids.  Wanting to eat.  Wants Foley catheter out.  Apparently it was placed in the emergency room.  Objective:  Vital signs:  Vitals:   04/25/17 2100 04/26/17 0447 04/26/17 0803 04/26/17 0806  BP: 102/66 115/65    Pulse: (!) 101 98    Resp: 18 14    Temp: 99 F (37.2 C) 98.3 F (36.8 C)    TempSrc: Oral Oral    SpO2: 96% 98% 96% 96%  Height:        Last BM Date: 04/24/17  Intake/Output   Yesterday:  12/31 0701 - 01/01 0700 In: 2213.3 [P.O.:420; I.V.:1433.3; IV Piggyback:350] Out: 1572 [Urine:1250; Drains:145] This shift:  No intake/output data recorded.  Bowel function:  Flatus: YES  BM:  No  Drain: Dark reddish brown.  Old blood versus feculent   Physical Exam:  General: Pt awake/alert/oriented x4 in no acute distress Eyes: PERRL, normal EOM.  Sclera clear.  No icterus Neuro: CN II-XII intact w/o focal sensory/motor deficits. Lymph: No head/neck/groin lymphadenopathy Psych:  No delerium/psychosis/paranoia HENT: Normocephalic, Mucus membranes moist.  No thrush Neck: Supple, No tracheal deviation Chest: On oxygen.  Distant breath sounds.  Some wheezing.  No chest wall pain w good excursion CV:  Pulses intact.  Regular rhythm MS: Normal AROM mjr joints.  No obvious deformity  Abdomen: Somewhat firm.  Mildy distended.  Nontender.  No evidence of peritonitis.  No incarcerated hernias.  Ext:  No deformity.  No mjr edema.  No cyanosis Skin: No petechiae / purpura  Results:   Labs: Results for orders placed or performed during the hospital encounter of 04/24/17 (from the past 48 hour(s))  CBC with  Differential     Status: Abnormal   Collection Time: 04/24/17  3:44 PM  Result Value Ref Range   WBC 19.8 (H) 4.0 - 10.5 K/uL   RBC 2.85 (L) 3.87 - 5.11 MIL/uL   Hemoglobin 10.2 (L) 12.0 - 15.0 g/dL   HCT 29.2 (L) 36.0 - 46.0 %   MCV 102.5 (H) 78.0 - 100.0 fL   MCH 35.8 (H) 26.0 - 34.0 pg   MCHC 34.9 30.0 - 36.0 g/dL   RDW 17.6 (H) 11.5 - 15.5 %   Platelets 176 150 - 400 K/uL   Neutrophils Relative % 89 %   Lymphocytes Relative 8 %   Monocytes Relative 3 %   Eosinophils Relative 0 %   Basophils Relative 0 %   Neutro Abs 17.6 (H) 1.7 - 7.7 K/uL   Lymphs Abs 1.6 0.7 - 4.0 K/uL   Monocytes Absolute 0.6 0.1 - 1.0 K/uL   Eosinophils Absolute 0.0 0.0 - 0.7 K/uL   Basophils Absolute 0.0 0.0 - 0.1 K/uL   Smear Review MORPHOLOGY UNREMARKABLE   Comprehensive metabolic panel     Status: Abnormal   Collection Time: 04/24/17  3:44 PM  Result Value Ref Range   Sodium 132 (L) 135 - 145 mmol/L   Potassium 3.6 3.5 - 5.1 mmol/L   Chloride 93 (L) 101 - 111 mmol/L   CO2 25 22 - 32 mmol/L   Glucose, Bld 131 (H) 65 - 99 mg/dL   BUN 17 6 - 20 mg/dL   Creatinine, Ser 0.91 0.44 - 1.00 mg/dL   Calcium 10.2 8.9 - 10.3 mg/dL   Total Protein 6.8 6.5 - 8.1 g/dL   Albumin 2.7 (L) 3.5 - 5.0 g/dL   AST 290 (H) 15 - 41 U/L   ALT 46 14 - 54 U/L   Alkaline Phosphatase 559 (H) 38 - 126 U/L   Total Bilirubin 4.7 (H) 0.3 - 1.2 mg/dL   GFR calc non Af Amer >60 >60 mL/min   GFR calc Af Amer >60 >60 mL/min    Comment: (NOTE) The eGFR has been calculated using the CKD EPI equation. This calculation has not been validated in all clinical situations. eGFR's persistently <60 mL/min signify possible Chronic Kidney Disease.    Anion gap 14 5 - 15  Lipase, blood     Status: None   Collection Time: 04/24/17  3:44 PM  Result Value Ref Range   Lipase 20 11 - 51 U/L  I-Stat Troponin, ED (not at [facility])     Status: None   Collection Time: 04/24/17  3:55 PM  Result Value Ref Range   Troponin i, poc 0.00 0.00 - 0.08  ng/mL   Comment 3            Comment: Due to the release kinetics of cTnI, a negative result within the first hours of the onset of symptoms does not rule out myocardial infarction with certainty. If myocardial infarction is still suspected, repeat the test at appropriate intervals.   Blood culture (routine x 2)     Status: None (Preliminary result)   Collection Time: 04/24/17  5:00 PM  Result Value Ref Range   Specimen Description BLOOD CHEST PORTA CATH    Special  Requests      BOTTLES DRAWN AEROBIC AND ANAEROBIC Blood Culture adequate volume   Culture      NO GROWTH < 24 HOURS Performed at [facility] [address] [place], [place] [address]    Report Status PENDING   Blood culture (routine x 2)     Status: None (Preliminary result)   Collection Time: 04/24/17  5:45 PM  Result Value Ref Range   Specimen Description BLOOD CHEST PORTA CATH    Special Requests      BOTTLES DRAWN AEROBIC AND ANAEROBIC Blood Culture results may not be optimal due to an excessive volume of blood received in culture bottles   Culture      NO GROWTH < 24 HOURS Performed at [facility] [address], [place], [place] [address]    Report Status PENDING   I-Stat CG4 Lactic Acid, ED     Status: None   Collection Time: 04/24/17  5:54 PM  Result Value Ref Range   Lactic Acid, Venous 1.27 0.5 - 1.9 mmol/L  Urinalysis, Routine w reflex microscopic     Status: Abnormal   Collection Time: 04/24/17  9:18 PM  Result Value Ref Range   Color, Urine AMBER (A) YELLOW    Comment: BIOCHEMICALS MAY BE AFFECTED BY COLOR   APPearance CLEAR CLEAR   Specific Gravity, Urine 1.020 1.005 - 1.030   pH 5.0 5.0 - 8.0   Glucose, UA NEGATIVE NEGATIVE mg/dL   Hgb urine dipstick NEGATIVE NEGATIVE   Bilirubin Urine SMALL (A) NEGATIVE   Ketones, ur 5 (A) NEGATIVE mg/dL   Protein, ur 30 (A) NEGATIVE mg/dL   Nitrite NEGATIVE NEGATIVE   Leukocytes, UA NEGATIVE NEGATIVE   RBC / HPF 0-5 0 - 5 RBC/hpf   WBC,  UA 0-5 0 - 5 WBC/hpf   Bacteria, UA RARE (A) NONE SEEN   Squamous Epithelial / LPF NONE SEEN NONE SEEN   Mucus PRESENT   Urine culture     Status: None   Collection Time: 04/24/17  9:18 PM  Result Value Ref Range   Specimen Description URINE, CATHETERIZED    Special Requests NONE    Culture      NO GROWTH Performed at [facility], [address], [place], [place] [address]    Report Status 04/26/2017 FINAL   HIV antibody (Routine Testing)     Status: None   Collection Time: 04/25/17  4:59 AM  Result Value Ref Range   HIV Screen 4th Generation wRfx Non Reactive Non Reactive    Comment: (NOTE) Performed At: [facility] [place], [place] [id number] Rush Farmer MD MO:[id number]   CBC     Status: Abnormal   Collection Time: 04/25/17  4:59 AM  Result Value Ref Range   WBC 15.4 (H) 4.0 - 10.5 K/uL   RBC 2.30 (L) 3.87 - 5.11 MIL/uL   Hemoglobin 8.3 (L) 12.0 - 15.0 g/dL   HCT 23.9 (L) 36.0 - 46.0 %   MCV 103.9 (H) 78.0 - 100.0 fL   MCH 36.1 (H) 26.0 - 34.0 pg   MCHC 34.7 30.0 - 36.0 g/dL   RDW 18.2 (H) 11.5 - 15.5 %   Platelets 145 (L) 150 - 400 K/uL  Comprehensive metabolic panel     Status: Abnormal   Collection Time: 04/25/17  4:59 AM  Result Value Ref Range   Sodium 134 (L) 135 - 145 mmol/L   Potassium 3.7 3.5 - 5.1  mmol/L   Chloride 101 101 - 111 mmol/L   CO2 24 22 - 32 mmol/L   Glucose, Bld 89 65 - 99 mg/dL   BUN 16 6 - 20 mg/dL   Creatinine, Ser 0.89 0.44 - 1.00 mg/dL   Calcium 9.6 8.9 - 10.3 mg/dL   Total Protein 6.3 (L) 6.5 - 8.1 g/dL   Albumin 2.4 (L) 3.5 - 5.0 g/dL   AST 247 (H) 15 - 41 U/L   ALT 37 14 - 54 U/L   Alkaline Phosphatase 447 (H) 38 - 126 U/L   Total Bilirubin 4.3 (H) 0.3 - 1.2 mg/dL   GFR calc non Af Amer >60 >60 mL/min   GFR calc Af Amer >60 >60 mL/min    Comment: (NOTE) The eGFR has been calculated using the CKD EPI equation. This calculation has not been validated in all clinical situations. eGFR's  persistently <60 mL/min signify possible Chronic Kidney Disease.    Anion gap 9 5 - 15  Protime-INR     Status: None   Collection Time: 04/25/17 10:27 AM  Result Value Ref Range   Prothrombin Time 14.6 11.4 - 15.2 seconds   INR 1.15   MRSA PCR Screening     Status: None   Collection Time: 04/25/17 12:50 PM  Result Value Ref Range   MRSA by PCR NEGATIVE NEGATIVE    Comment:        The GeneXpert MRSA Assay (FDA approved for NASAL specimens only), is one component of a comprehensive MRSA colonization surveillance program. It is not intended to diagnose MRSA infection nor to guide or monitor treatment for MRSA infections.   Aerobic/Anaerobic Culture (surgical/deep wound)     Status: None (Preliminary result)   Collection Time: 04/25/17  2:55 PM  Result Value Ref Range   Specimen Description ABSCESS    Special Requests Normal    Gram Stain      ABUNDANT WBC PRESENT,BOTH PMN AND MONONUCLEAR ABUNDANT GRAM POSITIVE COCCI IN PAIRS MODERATE GRAM VARIABLE ROD Performed at [facility], [place] [address], [place], [place] [address]    Culture PENDING    Report Status PENDING   CBC with Differential/Platelet     Status: Abnormal   Collection Time: 04/26/17  4:49 AM  Result Value Ref Range   WBC 14.2 (H) 4.0 - 10.5 K/uL   RBC 2.21 (L) 3.87 - 5.11 MIL/uL   Hemoglobin 8.1 (L) 12.0 - 15.0 g/dL   HCT 23.3 (L) 36.0 - 46.0 %   MCV 105.4 (H) 78.0 - 100.0 fL   MCH 36.7 (H) 26.0 - 34.0 pg   MCHC 34.8 30.0 - 36.0 g/dL   RDW 18.4 (H) 11.5 - 15.5 %   Platelets 135 (L) 150 - 400 K/uL   Neutrophils Relative % 88 %   Neutro Abs 12.5 (H) 1.7 - 7.7 K/uL   Lymphocytes Relative 9 %   Lymphs Abs 1.3 0.7 - 4.0 K/uL   Monocytes Relative 2 %   Monocytes Absolute 0.3 0.1 - 1.0 K/uL   Eosinophils Relative 1 %   Eosinophils Absolute 0.1 0.0 - 0.7 K/uL   Basophils Relative 0 %   Basophils Absolute 0.0 0.0 - 0.1 K/uL  Comprehensive metabolic panel     Status: Abnormal   Collection Time: 04/26/17   4:49 AM  Result Value Ref Range   Sodium 135 135 - 145 mmol/L   Potassium 3.6 3.5 - 5.1 mmol/L   Chloride 102 101 - 111 mmol/L  CO2 25 22 - 32 mmol/L   Glucose, Bld 78 65 - 99 mg/dL   BUN 14 6 - 20 mg/dL   Creatinine, Ser 0.97 0.44 - 1.00 mg/dL   Calcium 9.6 8.9 - 10.3 mg/dL   Total Protein 5.6 (L) 6.5 - 8.1 g/dL   Albumin 2.0 (L) 3.5 - 5.0 g/dL   AST 258 (H) 15 - 41 U/L   ALT 32 14 - 54 U/L   Alkaline Phosphatase 446 (H) 38 - 126 U/L   Total Bilirubin 3.4 (H) 0.3 - 1.2 mg/dL   GFR calc non Af Amer >60 >60 mL/min   GFR calc Af Amer >60 >60 mL/min    Comment: (NOTE) The eGFR has been calculated using the CKD EPI equation. This calculation has not been validated in all clinical situations. eGFR's persistently <60 mL/min signify possible Chronic Kidney Disease.    Anion gap 8 5 - 15    Imaging / Studies: Dg Chest 2 View  Result Date: 04/24/2017 CLINICAL DATA:  Shortness of breath. All on immunotherapy for lung cancer. Ex-smoker. EXAM: CHEST  2 VIEW COMPARISON:  CT 03/09/2017.  Plain film of 01/09/2017. FINDINGS: Lateral view degraded by patient arm position. Right Port-A-Cath terminates at the low SVC. Midline trachea. Normal heart size. New small left pleural effusion. Probable treatment effects in the left upper lobe laterally. Clear right lung. Left base subsegmental atelectasis. IMPRESSION: New small left pleural effusion with adjacent left base subsegmental atelectasis. Electronically Signed   By: Abigail Miyamoto M.D.   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 History of palliative radiotherapy of an enlarging left upper lobe pulmonary nodule. Ongoing medical therapy. Patient presents with generalized abdominal pain and clinical concern for pulmonary embolism. EXAM: CT ANGIOGRAPHY CHEST CT ABDOMEN AND PELVIS WITH CONTRAST  TECHNIQUE: Multidetector CT imaging of the chest was performed using the standard protocol during bolus administration of intravenous contrast. Multiplanar CT image reconstructions and MIPs were obtained to evaluate the vascular anatomy. Multidetector CT imaging of the abdomen and pelvis was performed using the standard protocol during bolus administration of intravenous contrast. CONTRAST:  < 100 cc > ISOVUE-370 IOPAMIDOL (ISOVUE-370) INJECTION 76% COMPARISON:  03/09/2017 CT chest and abdomen. Chest radiograph from earlier today. 10/15/2016 CT abdomen/pelvis. FINDINGS: CTA CHEST FINDINGS Cardiovascular: The study is moderate quality for the evaluation of pulmonary embolism, with evaluation of the subsegmental vessels limited by motion. There are no filling defects in the central, lobar, segmental or subsegmental pulmonary artery branches to suggest acute pulmonary embolism. Mildly atherosclerotic nonaneurysmal thoracic aorta. Normal caliber pulmonary arteries. Normal heart size. No significant pericardial fluid/thickening. Right internal jugular MediPort terminates in the right atrium. Mediastinum/Nodes: No discrete thyroid nodules. Unremarkable esophagus. No axillary adenopathy. Newly enlarged 2.0 cm right supraclavicular node (series 7/image 8). Increased right paratracheal adenopathy measuring up to 2.1 cm (series 7/ image 24), previously 1.2 cm. Increased prevascular bilateral mediastinal adenopathy measuring up to 2.0 cm on the right (series 7/ image 26), previously 1.3 cm. Increased 1.9 cm enlarged subcarinal node (series 7/ image 42), previously 1.1 cm. Newly enlarged 1.8 cm AP window node (series 7/ image 35). Newly moderate infiltrative left hilar adenopathy measuring up to 2.0 cm (series 7/ image 48), with associated extrinsic narrowing of segmental lingular and left lower lobe airways. No right hilar adenopathy. Lungs/Pleura: No pneumothorax. No right pleural effusion.  New small dependent left pleural  effusion. Mild centrilobular emphysema with mild diffuse bronchial wall thickening. Stable bandlike consolidation in the anterior left upper lobe compatible with radiation fibrosis. Stable irregular solid 1.5 cm left upper lobe pulmonary nodule (series 13/image 40). New patchy consolidation, parenchymal banding and nodularity in the posterior left upper lobe and anterior left lower lobe. A few new scattered solid pulmonary nodules in the right lung, largest 6 mm in the anterior right middle lobe (series 13/ image 76). Musculoskeletal: No aggressive appearing focal osseous lesions. Moderate thoracic spondylosis. Review of the MIP images confirms the above findings. CT ABDOMEN and PELVIS FINDINGS Hepatobiliary: New hepatomegaly. Innumerable hypoenhancing masses replacing much of the liver, significantly increased in size and number, for example a 2.0 cm superior liver mass (series 4/ image 15), increased from 0.7 cm. Normal gallbladder with no radiopaque cholelithiasis. No biliary ductal dilatation. Pancreas: Normal, with no mass or duct dilation. Spleen: Normal size. No mass. Adrenals/Urinary Tract: Newly apparent 1.4 cm left adrenal nodule with density 64 HU. No right adrenal nodule. No hydronephrosis. Subcentimeter hypodense right renal cortical lesion is too small to characterize and is stable. No new renal lesions. Normal bladder. Stomach/Bowel: Normal non-distended stomach. Normal appendix. Mild sigmoid diverticulosis. There is a gas containing 6.0 x 4.9 x 7.2 cm multilocular abscess in the ventral left lower abdomen (series 4/image 66) with thick enhancing wall, which appears to demonstrate a fistulous connection to the proximal sigmoid colon (series 15/image 70), and which is intimately associated with multiple small bowel loops. No small bowel dilatation. No focal small bowel caliber transition. No definite small bowel wall thickening. Vascular/Lymphatic: Atherosclerotic nonaneurysmal abdominal aorta. Patent  portal, splenic, hepatic and renal veins. No pathologically enlarged lymph nodes in the abdomen or pelvis. Reproductive: Status post hysterectomy, with no abnormal findings at the vaginal cuff. No adnexal mass. Other: No ascites. Musculoskeletal: Previously visualized faintly sclerotic lesions throughout the lumbar vertebral bodies are not discretely visualized on today's scan. Mild lumbar spondylosis. Review of the MIP images confirms the above findings. IMPRESSION: 1. No evidence of pulmonary embolism. 2. Complex gas-containing multilocular abscess in the anterior left lower abdomen with fistulous connection to the proximal sigmoid colon (suggesting the sequela of perforated diverticulitis), intimately associated with multiple small bowel loops. No evidence of bowel obstruction. 3. Significant progression of metastatic disease. Right supraclavicular, bilateral mediastinal and left hilar adenopathy is significantly increased. 4. New extrinsic narrowing of segmental left upper and left lower lobe airways by the infiltrative left hilar adenopathy. New patchy consolidation and nodularity in the posterior left upper lobe and anterior left lower lobe, favor a combination of postobstructive pneumonia and tumor. New scattered probable small pulmonary metastases in the right lung. 5. Liver metastases are significantly increased in size and number with new hepatomegaly. 6. New left adrenal metastasis. 7. New small dependent left pleural effusion. 8. Aortic Atherosclerosis (ICD10-I70.0) and Emphysema (ICD10-J43.9). Electronically Signed   By: Ilona Sorrel M.D.   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 History of palliative radiotherapy of an enlarging left upper lobe pulmonary nodule. Ongoing medical therapy. Patient presents with generalized abdominal pain and clinical concern  for pulmonary embolism. EXAM: CT ANGIOGRAPHY CHEST CT ABDOMEN AND PELVIS WITH CONTRAST TECHNIQUE: Multidetector CT imaging of the chest was performed using the standard protocol during bolus administration of intravenous contrast. Multiplanar CT image reconstructions and MIPs  were obtained to evaluate the vascular anatomy. Multidetector CT imaging of the abdomen and pelvis was performed using the standard protocol during bolus administration of intravenous contrast. CONTRAST:  < 100 cc > ISOVUE-370 IOPAMIDOL (ISOVUE-370) INJECTION 76% COMPARISON:  03/09/2017 CT chest and abdomen. Chest radiograph from earlier today. 10/15/2016 CT abdomen/pelvis. FINDINGS: CTA CHEST FINDINGS Cardiovascular: The study is moderate quality for the evaluation of pulmonary embolism, with evaluation of the subsegmental vessels limited by motion. There are no filling defects in the central, lobar, segmental or subsegmental pulmonary artery branches to suggest acute pulmonary embolism. Mildly atherosclerotic nonaneurysmal thoracic aorta. Normal caliber pulmonary arteries. Normal heart size. No significant pericardial fluid/thickening. Right internal jugular MediPort terminates in the right atrium. Mediastinum/Nodes: No discrete thyroid nodules. Unremarkable esophagus. No axillary adenopathy. Newly enlarged 2.0 cm right supraclavicular node (series 7/image 8). Increased right paratracheal adenopathy measuring up to 2.1 cm (series 7/ image 24), previously 1.2 cm. Increased prevascular bilateral mediastinal adenopathy measuring up to 2.0 cm on the right (series 7/ image 26), previously 1.3 cm. Increased 1.9 cm enlarged subcarinal node (series 7/ image 42), previously 1.1 cm. Newly enlarged 1.8 cm AP window node (series 7/ image 35). Newly moderate infiltrative left hilar adenopathy measuring up to 2.0 cm (series 7/ image 48), with associated extrinsic narrowing of segmental lingular and left lower lobe airways. No right hilar adenopathy.  Lungs/Pleura: No pneumothorax. No right pleural effusion. New small dependent left pleural effusion. Mild centrilobular emphysema with mild diffuse bronchial wall thickening. Stable bandlike consolidation in the anterior left upper lobe compatible with radiation fibrosis. Stable irregular solid 1.5 cm left upper lobe pulmonary nodule (series 13/image 40). New patchy consolidation, parenchymal banding and nodularity in the posterior left upper lobe and anterior left lower lobe. A few new scattered solid pulmonary nodules in the right lung, largest 6 mm in the anterior right middle lobe (series 13/ image 76). Musculoskeletal: No aggressive appearing focal osseous lesions. Moderate thoracic spondylosis. Review of the MIP images confirms the above findings. CT ABDOMEN and PELVIS FINDINGS Hepatobiliary: New hepatomegaly. Innumerable hypoenhancing masses replacing much of the liver, significantly increased in size and number, for example a 2.0 cm superior liver mass (series 4/ image 15), increased from 0.7 cm. Normal gallbladder with no radiopaque cholelithiasis. No biliary ductal dilatation. Pancreas: Normal, with no mass or duct dilation. Spleen: Normal size. No mass. Adrenals/Urinary Tract: Newly apparent 1.4 cm left adrenal nodule with density 64 HU. No right adrenal nodule. No hydronephrosis. Subcentimeter hypodense right renal cortical lesion is too small to characterize and is stable. No new renal lesions. Normal bladder. Stomach/Bowel: Normal non-distended stomach. Normal appendix. Mild sigmoid diverticulosis. There is a gas containing 6.0 x 4.9 x 7.2 cm multilocular abscess in the ventral left lower abdomen (series 4/image 66) with thick enhancing wall, which appears to demonstrate a fistulous connection to the proximal sigmoid colon (series 15/image 70), and which is intimately associated with multiple small bowel loops. No small bowel dilatation. No focal small bowel caliber transition. No definite small bowel  wall thickening. Vascular/Lymphatic: Atherosclerotic nonaneurysmal abdominal aorta. Patent portal, splenic, hepatic and renal veins. No pathologically enlarged lymph nodes in the abdomen or pelvis. Reproductive: Status post hysterectomy, with no abnormal findings at the vaginal cuff. No adnexal mass. Other: No ascites. Musculoskeletal: Previously visualized faintly sclerotic lesions throughout the lumbar vertebral bodies are not discretely visualized on today's scan. Mild lumbar spondylosis. Review of the MIP images confirms the above findings. IMPRESSION: 1. No evidence of pulmonary embolism. 2. Complex  gas-containing multilocular abscess in the anterior left lower abdomen with fistulous connection to the proximal sigmoid colon (suggesting the sequela of perforated diverticulitis), intimately associated with multiple small bowel loops. No evidence of bowel obstruction. 3. Significant progression of metastatic disease. Right supraclavicular, bilateral mediastinal and left hilar adenopathy is significantly increased. 4. New extrinsic narrowing of segmental left upper and left lower lobe airways by the infiltrative left hilar adenopathy. New patchy consolidation and nodularity in the posterior left upper lobe and anterior left lower lobe, favor a combination of postobstructive pneumonia and tumor. New scattered probable small pulmonary metastases in the right lung. 5. Liver metastases are significantly increased in size and number with new hepatomegaly. 6. New left adrenal metastasis. 7. New small dependent left pleural effusion. 8. Aortic Atherosclerosis (ICD10-I70.0) and Emphysema (ICD10-J43.9). Electronically Signed   By: Ilona Sorrel M.D.   On: 04/24/2017 18:01   Ct Image Guided Drainage By Percutaneous Catheter  Result Date: 04/25/2017 INDICATION: History of metastatic small cell lung cancer with findings worrisome for advanced metastatic disease. Unfortunately, patient has developed a concomitant presumed  diverticular abscess within the ventral aspect of the left lower abdomen. Given poor operative candidacy, request made for CT-guided percutaneous drainage catheter placement for infection source control purposes. EXAM: CT IMAGE GUIDED DRAINAGE BY PERCUTANEOUS CATHETER COMPARISON:  CT abdomen and pelvis - 04/24/2017 MEDICATIONS: The patient is currently admitted to the hospital and receiving intravenous antibiotics. The antibiotics were administered within an appropriate time frame prior to the initiation of the procedure. ANESTHESIA/SEDATION: Moderate (conscious) sedation was employed during this procedure. A total of Versed 2 mg and Fentanyl 100 mcg was administered intravenously. Moderate Sedation Time: 13 minutes. The patient's level of consciousness and vital signs were monitored continuously by radiology nursing throughout the procedure under my direct supervision. CONTRAST:  None COMPLICATIONS: None immediate. PROCEDURE: Informed written consent was obtained from the patient after a discussion of the risks, benefits and alternatives to treatment. The patient was placed supine on the CT gantry and a pre procedural CT was performed re-demonstrating the known abscess/fluid collection within the ventral aspect of the left lower abdomen/ pelvis with dominant air in fluid containing component measuring approximately 6.8 x 4.7 cm (image 18, series 2). The procedure was planned. A timeout was performed prior to the initiation of the procedure. The skin overlying the ventral aspect of the left lower abdomen was prepped and draped in the usual sterile fashion. The overlying soft tissues were anesthetized with 1% lidocaine with epinephrine. Appropriate trajectory was planned with the use of a 22 gauge spinal needle. An 18 gauge trocar needle was advanced into the abscess/fluid collection and a short Amplatz super stiff wire was coiled within the collection. Appropriate positioning was confirmed with a limited CT scan.  The tract was serially dilated allowing placement of a 10 [place] all-purpose drainage catheter. Appropriate positioning was confirmed with a limited postprocedural CT scan. Approximately 80 Ml of purulent fluid was aspirated. The tube was connected to a drainage bag and sutured in place. A dressing was placed. The patient tolerated the procedure well without immediate post procedural complication. IMPRESSION: Successful CT guided placement of a 10 [place] all purpose drain catheter into the presumed diverticular abscess within the ventral aspect the left lower abdomen with aspiration of 80 mL of purulent fluid. Samples were sent to the laboratory as requested by the ordering clinical team. Electronically Signed   By: Sandi Mariscal M.D.   On: 04/25/2017 14:55    Medications / Allergies:  per chart  Antibiotics: Anti-infectives (From admission, onward)   Start     Dose/Rate Route Frequency Ordered Stop   04/25/17 1800  vancomycin (VANCOCIN) IVPB 1000 mg/200 mL premix     1,000 mg 200 mL/hr over 60 Minutes Intravenous Every 24 hours 04/24/17 2024     04/24/17 2359  piperacillin-tazobactam (ZOSYN) IVPB 3.375 g     3.375 g 12.5 mL/hr over 240 Minutes Intravenous Every 8 hours 04/24/17 2024     04/24/17 1715  piperacillin-tazobactam (ZOSYN) IVPB 3.375 g     3.375 g 100 mL/hr over 30 Minutes Intravenous  Once 04/24/17 1714 04/24/17 1815   04/24/17 1715  vancomycin (VANCOCIN) 1,500 mg in sodium chloride 0.9 % 500 mL IVPB     1,500 mg 250 mL/hr over 120 Minutes Intravenous  Once 04/24/17 1714 04/24/17 2010        Note: Portions of this report may have been transcribed using voice recognition software. Every effort was made to ensure accuracy; however, inadvertent computerized transcription errors may be present.   Any transcriptional errors that result from this process are unintentional.     Adin Hector, M.D., F.A.C.S. Gastrointestinal and Minimally Invasive [facility] [address], [place], [place] [address] [phone] Main / Paging   04/26/2017

## 2017-04-26 NOTE — Progress Notes (Signed)
DIAGNOSIS: Extensive stage (T2b, N2, M1a) small cell lung cancer presented with large left upper lobe lung mass and mediastinal lymphadenopathy as well as malignant left pleural effusion diagnosed in July 2017.  PRIOR THERAPY: 1) Systemic chemotherapy with cisplatin 60 MG/M2 on day 1 and etoposide at 120 MG/M2 on days 1, 2 and 3 with Neulasta support on day 4. She is status post 6 cycles. 2) prophylactic cranial irradiation under the care of Dr. Sondra Come completed on 05/18/2016. 3) palliative radiotherapy to the enlarging left upper lobe lung nodule under the care of Dr. Sondra Come. 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 5)Systemic chemotherapy with cisplatin 30 MG/M2 and irinotecan 65 MG/M2 on days 1 and 8 every 3 weeks. First dose 10/21/2016.Status post4cycles.  CURRENT THERAPY: Temodar 280 mg daily at bedtime X 5 days.The patient started treatment on 03/18/2017. Status post 2 cycles.  Subjective: The patient is seen and examined today.  A family friend was at the bedside.  This is a very pleasant 62 years old white female diagnosed with extensive stage small cell lung cancer in July 2017 status post systemic chemotherapy with cisplatin and etoposide followed by prophylactic cranial irradiation followed by palliative radiotherapy to enlarging left upper lobe lung nodule followed by stereotactic radiotherapy to a new brain metastasis followed by new chemotherapy with cisplatin and irinotecan status post 4 cycles discontinued secondary to disease progression. The patient is currently undergoing treatment with Temodar orally status post 2 cycles.  She has been tolerating this treatment fairly well. She presented to [facility] on April 24, 2017 complaining of abdominal pain.  CT angiogram of the chest as well as CT of the abdomen and pelvis were performed on admission and that showed no evidence for pulmonary embolism but there was complex gas  containing multilocular abscess in the anterior left lower abdomen with fistulous connection to the proximal sigmoid colon suggesting sequela of perforated diverticulitis.  There was no evidence of bowel obstruction.  The patient also was found to have significant disease progression involving the right supraclavicular, bilateral mediastinal, left hilar adenopathy as well as increased in the liver metastasis.  She was not a good surgical candidate for intervention for the perforation.  She underwent CT-guided drainage by percutaneous catheter by interventional radiology and she is feeling much better.  She denied having any current fever or chills.  She has no nausea, vomiting, or diarrhea.  Objective: Vital signs in last 24 hours: Temp:  [98.3 F (36.8 C)-99.7 F (37.6 C)] 99.7 F (37.6 C) (01/01 1311) Pulse Rate:  [98-102] 102 (01/01 1311) Resp:  [14-20] 19 (01/01 1311) BP: (102-118)/(61-66) 110/61 (01/01 1311) SpO2:  [93 %-98 %] 97 % (01/01 1311)  Intake/Output from previous day: 12/31 0701 - 01/01 0700 In: 2213.3 [P.O.:420; I.V.:1433.3; IV Piggyback:350] Out: 3536 [Urine:1250; Drains:145] Intake/Output this shift: Total I/O In: 421.7 [I.V.:366.7; Other:5; IV Piggyback:50] Out: 475 [Urine:450; Drains:25]  General appearance: alert, cooperative, fatigued and no distress Resp: wheezes bilaterally Cardio: regular rate and rhythm, S1, S2 normal, no murmur, click, rub or gallop GI: soft, non-tender; bowel sounds normal; no masses,  no organomegaly Extremities: extremities normal, atraumatic, no cyanosis or edema  Lab Results:  Recent Labs    04/25/17 0459 04/26/17 0449  WBC 15.4* 14.2*  HGB 8.3* 8.1*  HCT 23.9* 23.3*  PLT 145* 135*   BMET Recent Labs    04/25/17 0459 04/26/17 0449  NA 134* 135  K 3.7 3.6  CL 101 102  CO2 24  25  GLUCOSE 89 78  BUN 16 14  CREATININE 0.89 0.97  CALCIUM 9.6 9.6    Studies/Results: Dg Chest 2 View  Result Date: 04/24/2017 CLINICAL  DATA:  Shortness of breath. All on immunotherapy for lung cancer. Ex-smoker. EXAM: CHEST  2 VIEW COMPARISON:  CT 03/09/2017.  Plain film of 01/09/2017. FINDINGS: Lateral view degraded by patient arm position. Right Port-A-Cath terminates at the low SVC. Midline trachea. Normal heart size. New small left pleural effusion. Probable treatment effects in the left upper lobe laterally. Clear right lung. Left base subsegmental atelectasis. IMPRESSION: New small left pleural effusion with adjacent left base subsegmental atelectasis. Electronically Signed   By: Abigail Miyamoto M.D.   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 History of palliative radiotherapy of an enlarging left upper lobe pulmonary nodule. Ongoing medical therapy. Patient presents with generalized abdominal pain and clinical concern for pulmonary embolism. EXAM: CT ANGIOGRAPHY CHEST CT ABDOMEN AND PELVIS WITH CONTRAST TECHNIQUE: Multidetector CT imaging of the chest was performed using the standard protocol during bolus administration of intravenous contrast. Multiplanar CT image reconstructions and MIPs were obtained to evaluate the vascular anatomy. Multidetector CT imaging of the abdomen and pelvis was performed using the standard protocol during bolus administration of intravenous contrast. CONTRAST:  < 100 cc > ISOVUE-370 IOPAMIDOL (ISOVUE-370) INJECTION 76% COMPARISON:  03/09/2017 CT chest and abdomen. Chest radiograph from earlier today. 10/15/2016 CT abdomen/pelvis. FINDINGS: CTA CHEST FINDINGS Cardiovascular: The study is moderate quality for the evaluation of pulmonary embolism, with evaluation of the subsegmental vessels limited by motion. There are no filling defects in the central, lobar, segmental or subsegmental pulmonary artery branches to suggest acute pulmonary embolism. Mildly  atherosclerotic nonaneurysmal thoracic aorta. Normal caliber pulmonary arteries. Normal heart size. No significant pericardial fluid/thickening. Right internal jugular MediPort terminates in the right atrium. Mediastinum/Nodes: No discrete thyroid nodules. Unremarkable esophagus. No axillary adenopathy. Newly enlarged 2.0 cm right supraclavicular node (series 7/image 8). Increased right paratracheal adenopathy measuring up to 2.1 cm (series 7/ image 24), previously 1.2 cm. Increased prevascular bilateral mediastinal adenopathy measuring up to 2.0 cm on the right (series 7/ image 26), previously 1.3 cm. Increased 1.9 cm enlarged subcarinal node (series 7/ image 42), previously 1.1 cm. Newly enlarged 1.8 cm AP window node (series 7/ image 35). Newly moderate infiltrative left hilar adenopathy measuring up to 2.0 cm (series 7/ image 48), with associated extrinsic narrowing of segmental lingular and left lower lobe airways. No right hilar adenopathy. Lungs/Pleura: No pneumothorax. No right pleural effusion. New small dependent left pleural effusion. Mild centrilobular emphysema with mild diffuse bronchial wall thickening. Stable bandlike consolidation in the anterior left upper lobe compatible with radiation fibrosis. Stable irregular solid 1.5 cm left upper lobe pulmonary nodule (series 13/image 40). New patchy consolidation, parenchymal banding and nodularity in the posterior left upper lobe and anterior left lower lobe. A few new scattered solid pulmonary nodules in the right lung, largest 6 mm in the anterior right middle lobe (series 13/ image 76). Musculoskeletal: No aggressive appearing focal osseous lesions. Moderate thoracic spondylosis. Review of the MIP images confirms the above findings. CT ABDOMEN and PELVIS FINDINGS Hepatobiliary: New hepatomegaly. Innumerable hypoenhancing masses replacing much of the liver, significantly increased in size and number, for example a 2.0 cm superior liver mass (series 4/  image 15), increased from 0.7 cm. Normal  gallbladder with no radiopaque cholelithiasis. No biliary ductal dilatation. Pancreas: Normal, with no mass or duct dilation. Spleen: Normal size. No mass. Adrenals/Urinary Tract: Newly apparent 1.4 cm left adrenal nodule with density 64 HU. No right adrenal nodule. No hydronephrosis. Subcentimeter hypodense right renal cortical lesion is too small to characterize and is stable. No new renal lesions. Normal bladder. Stomach/Bowel: Normal non-distended stomach. Normal appendix. Mild sigmoid diverticulosis. There is a gas containing 6.0 x 4.9 x 7.2 cm multilocular abscess in the ventral left lower abdomen (series 4/image 66) with thick enhancing wall, which appears to demonstrate a fistulous connection to the proximal sigmoid colon (series 15/image 70), and which is intimately associated with multiple small bowel loops. No small bowel dilatation. No focal small bowel caliber transition. No definite small bowel wall thickening. Vascular/Lymphatic: Atherosclerotic nonaneurysmal abdominal aorta. Patent portal, splenic, hepatic and renal veins. No pathologically enlarged lymph nodes in the abdomen or pelvis. Reproductive: Status post hysterectomy, with no abnormal findings at the vaginal cuff. No adnexal mass. Other: No ascites. Musculoskeletal: Previously visualized faintly sclerotic lesions throughout the lumbar vertebral bodies are not discretely visualized on today's scan. Mild lumbar spondylosis. Review of the MIP images confirms the above findings. IMPRESSION: 1. No evidence of pulmonary embolism. 2. Complex gas-containing multilocular abscess in the anterior left lower abdomen with fistulous connection to the proximal sigmoid colon (suggesting the sequela of perforated diverticulitis), intimately associated with multiple small bowel loops. No evidence of bowel obstruction. 3. Significant progression of metastatic disease. Right supraclavicular, bilateral mediastinal and left  hilar adenopathy is significantly increased. 4. New extrinsic narrowing of segmental left upper and left lower lobe airways by the infiltrative left hilar adenopathy. New patchy consolidation and nodularity in the posterior left upper lobe and anterior left lower lobe, favor a combination of postobstructive pneumonia and tumor. New scattered probable small pulmonary metastases in the right lung. 5. Liver metastases are significantly increased in size and number with new hepatomegaly. 6. New left adrenal metastasis. 7. New small dependent left pleural effusion. 8. Aortic Atherosclerosis (ICD10-I70.0) and Emphysema (ICD10-J43.9). Electronically Signed   By: Ilona Sorrel M.D.   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 History of palliative radiotherapy of an enlarging left upper lobe pulmonary nodule. Ongoing medical therapy. Patient presents with generalized abdominal pain and clinical concern for pulmonary embolism. EXAM: CT ANGIOGRAPHY CHEST CT ABDOMEN AND PELVIS WITH CONTRAST TECHNIQUE: Multidetector CT imaging of the chest was performed using the standard protocol during bolus administration of intravenous contrast. Multiplanar CT image reconstructions and MIPs were obtained to evaluate the vascular anatomy. Multidetector CT imaging of the abdomen and pelvis was performed using the standard protocol during bolus administration of intravenous contrast. CONTRAST:  < 100 cc > ISOVUE-370 IOPAMIDOL (ISOVUE-370) INJECTION 76% COMPARISON:  03/09/2017 CT chest and abdomen. Chest radiograph from earlier today. 10/15/2016 CT abdomen/pelvis. FINDINGS: CTA CHEST FINDINGS Cardiovascular: The study is moderate quality for the evaluation of pulmonary embolism, with evaluation of the subsegmental vessels limited by motion. There are no filling defects in the central, lobar, segmental  or subsegmental pulmonary artery branches to suggest acute pulmonary embolism. Mildly atherosclerotic nonaneurysmal thoracic aorta. Normal caliber pulmonary arteries. Normal heart size. No significant pericardial fluid/thickening. Right internal jugular MediPort terminates in the right atrium. Mediastinum/Nodes: No discrete thyroid nodules. Unremarkable esophagus. No axillary adenopathy. Newly enlarged 2.0 cm right supraclavicular node (series 7/image  8). Increased right paratracheal adenopathy measuring up to 2.1 cm (series 7/ image 24), previously 1.2 cm. Increased prevascular bilateral mediastinal adenopathy measuring up to 2.0 cm on the right (series 7/ image 26), previously 1.3 cm. Increased 1.9 cm enlarged subcarinal node (series 7/ image 42), previously 1.1 cm. Newly enlarged 1.8 cm AP window node (series 7/ image 35). Newly moderate infiltrative left hilar adenopathy measuring up to 2.0 cm (series 7/ image 48), with associated extrinsic narrowing of segmental lingular and left lower lobe airways. No right hilar adenopathy. Lungs/Pleura: No pneumothorax. No right pleural effusion. New small dependent left pleural effusion. Mild centrilobular emphysema with mild diffuse bronchial wall thickening. Stable bandlike consolidation in the anterior left upper lobe compatible with radiation fibrosis. Stable irregular solid 1.5 cm left upper lobe pulmonary nodule (series 13/image 40). New patchy consolidation, parenchymal banding and nodularity in the posterior left upper lobe and anterior left lower lobe. A few new scattered solid pulmonary nodules in the right lung, largest 6 mm in the anterior right middle lobe (series 13/ image 76). Musculoskeletal: No aggressive appearing focal osseous lesions. Moderate thoracic spondylosis. Review of the MIP images confirms the above findings. CT ABDOMEN and PELVIS FINDINGS Hepatobiliary: New hepatomegaly. Innumerable hypoenhancing masses replacing much of the liver,  significantly increased in size and number, for example a 2.0 cm superior liver mass (series 4/ image 15), increased from 0.7 cm. Normal gallbladder with no radiopaque cholelithiasis. No biliary ductal dilatation. Pancreas: Normal, with no mass or duct dilation. Spleen: Normal size. No mass. Adrenals/Urinary Tract: Newly apparent 1.4 cm left adrenal nodule with density 64 HU. No right adrenal nodule. No hydronephrosis. Subcentimeter hypodense right renal cortical lesion is too small to characterize and is stable. No new renal lesions. Normal bladder. Stomach/Bowel: Normal non-distended stomach. Normal appendix. Mild sigmoid diverticulosis. There is a gas containing 6.0 x 4.9 x 7.2 cm multilocular abscess in the ventral left lower abdomen (series 4/image 66) with thick enhancing wall, which appears to demonstrate a fistulous connection to the proximal sigmoid colon (series 15/image 70), and which is intimately associated with multiple small bowel loops. No small bowel dilatation. No focal small bowel caliber transition. No definite small bowel wall thickening. Vascular/Lymphatic: Atherosclerotic nonaneurysmal abdominal aorta. Patent portal, splenic, hepatic and renal veins. No pathologically enlarged lymph nodes in the abdomen or pelvis. Reproductive: Status post hysterectomy, with no abnormal findings at the vaginal cuff. No adnexal mass. Other: No ascites. Musculoskeletal: Previously visualized faintly sclerotic lesions throughout the lumbar vertebral bodies are not discretely visualized on today's scan. Mild lumbar spondylosis. Review of the MIP images confirms the above findings. IMPRESSION: 1. No evidence of pulmonary embolism. 2. Complex gas-containing multilocular abscess in the anterior left lower abdomen with fistulous connection to the proximal sigmoid colon (suggesting the sequela of perforated diverticulitis), intimately associated with multiple small bowel loops. No evidence of bowel obstruction. 3.  Significant progression of metastatic disease. Right supraclavicular, bilateral mediastinal and left hilar adenopathy is significantly increased. 4. New extrinsic narrowing of segmental left upper and left lower lobe airways by the infiltrative left hilar adenopathy. New patchy consolidation and nodularity in the posterior left upper lobe and anterior left lower lobe, favor a combination of postobstructive pneumonia and tumor. New scattered probable small pulmonary metastases in the right lung. 5. Liver metastases are significantly increased in size and number with new hepatomegaly. 6. New left adrenal metastasis. 7. New small dependent left pleural effusion. 8. Aortic Atherosclerosis (ICD10-I70.0) and Emphysema (ICD10-J43.9). Electronically Signed   By:  Ilona Sorrel M.D.   On: 04/24/2017 18:01   Ct Image Guided Drainage By Percutaneous Catheter  Result Date: 04/25/2017 INDICATION: History of metastatic small cell lung cancer with findings worrisome for advanced metastatic disease. Unfortunately, patient has developed a concomitant presumed diverticular abscess within the ventral aspect of the left lower abdomen. Given poor operative candidacy, request made for CT-guided percutaneous drainage catheter placement for infection source control purposes. EXAM: CT IMAGE GUIDED DRAINAGE BY PERCUTANEOUS CATHETER COMPARISON:  CT abdomen and pelvis - 04/24/2017 MEDICATIONS: The patient is currently admitted to the hospital and receiving intravenous antibiotics. The antibiotics were administered within an appropriate time frame prior to the initiation of the procedure. ANESTHESIA/SEDATION: Moderate (conscious) sedation was employed during this procedure. A total of Versed 2 mg and Fentanyl 100 mcg was administered intravenously. Moderate Sedation Time: 13 minutes. The patient's level of consciousness and vital signs were monitored continuously by radiology nursing throughout the procedure under my direct supervision.  CONTRAST:  None COMPLICATIONS: None immediate. PROCEDURE: Informed written consent was obtained from the patient after a discussion of the risks, benefits and alternatives to treatment. The patient was placed supine on the CT gantry and a pre procedural CT was performed re-demonstrating the known abscess/fluid collection within the ventral aspect of the left lower abdomen/ pelvis with dominant air in fluid containing component measuring approximately 6.8 x 4.7 cm (image 18, series 2). The procedure was planned. A timeout was performed prior to the initiation of the procedure. The skin overlying the ventral aspect of the left lower abdomen was prepped and draped in the usual sterile fashion. The overlying soft tissues were anesthetized with 1% lidocaine with epinephrine. Appropriate trajectory was planned with the use of a 22 gauge spinal needle. An 18 gauge trocar needle was advanced into the abscess/fluid collection and a short Amplatz super stiff wire was coiled within the collection. Appropriate positioning was confirmed with a limited CT scan. The tract was serially dilated allowing placement of a 10 [place] all-purpose drainage catheter. Appropriate positioning was confirmed with a limited postprocedural CT scan. Approximately 80 Ml of purulent fluid was aspirated. The tube was connected to a drainage bag and sutured in place. A dressing was placed. The patient tolerated the procedure well without immediate post procedural complication. IMPRESSION: Successful CT guided placement of a 10 [place] all purpose drain catheter into the presumed diverticular abscess within the ventral aspect the left lower abdomen with aspiration of 80 mL of purulent fluid. Samples were sent to the laboratory as requested by the ordering clinical team. Electronically Signed   By: Sandi Mariscal M.D.   On: 04/25/2017 14:55    Medications: I have reviewed the patient's current medications.  Assessment/Plan: This is a very pleasant 62  years old white female with: 1) extensive stage small cell lung cancer status post several chemotherapy regimens including cisplatin and etoposide, cisplatin and irinotecan as well as Temodar.  The patient also status post palliative radiotherapy to the lung and brain.  Unfortunately her recent CT scan showed further disease progression. I had a lengthy discussion with the patient today about her current condition and treatment options.  I strongly recommended for the patient to consider palliative care and hospice at this point.  The patient is still optimistic and she would like to try more treatment in the future if needed.  I explained to the patient that she could be considered for treatment with immunotherapy with Nivolumab in the future but only after improvement of her general  status and resolution of the infection.  I still advised the patient to consider the palliative care approach for now.  She agreed to see the palliative care team. 2) diverticular abscess.  The patient will continue the drainage by the percutaneous catheter.  She is also currently on Zosyn and vancomycin. I will arrange for the patient a follow-up appointment with me after discharge for reevaluation and more detailed discussion of her treatment options. Thank you so much for taking good care of Ms. Hildenbrand, I would continue to follow-up the patient with you and assist in her management.    LOS: 2 days    [name] 04/26/2017

## 2017-04-26 NOTE — Progress Notes (Signed)
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  Currently on immunotherapy.  Patient presented to the emergency department with complaints of abdominal pain.  CT chest/abdomen/pelvis done in the emergency department showed perforated diverticulitis with abscess with fistula and progression of cancer. 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  IR performed drainage of the abscess.  Continue antibiotics. Continue IV fluids.  On vancomycin and Zosyn. Started on full liquid diet today.  Plan is to hold off on advancing further until she has full bowel movement. We will follow blood cultures.  Pain medications as necessary  CT image: Complex gas-containing multilocular abscess in the anterior left lower abdomen with fistulous connection to the proximal sigmoid colon (suggesting the sequela of perforated diverticulitis), intimately associated with multiple small bowel loops. No evidence of bowel obstruction. Significant progression of metastatic disease. Right supraclavicular, bilateral mediastinal and left hilar adenopathy is significantly increased. New extrinsic narrowing of segmental left upper and left lower lobe airways by the infiltrative left hilar adenopathy. New patchy consolidation and nodularity in the posterior left upper lobe and anterior left lower lobe, favor a  combination of postobstructive pneumonia and tumor. New scattered probable small pulmonary metastases in the right lung. Liver metastases are significantly increased in size and number with new hepatomegaly.  Metastatic small cell lung cancer with worsening disease: Case discussed with Dr. Julien Nordmann today. He will see the patient.We planned for consulting palliative care, consult placed. She has H/O extensive stage small cell lung cancer status post 6 cycles of systemic chemotherapy with cisplatin and etoposide followed by prophylactic cranial irradiation followed by palliative radiotherapy to a left upper lobe pulmonary nodule followed by stereotactic radiotherapy to solitary brain metastasis. Her metastatic disease looks to have progressed.  Patient wants to discuss with her kids about her code status.    Possible postoperative pneumonia: Continue antibiotics as above.  Respiratory status looks stable at the moment.  She has mild leukocytosis.  We will continue to monitor the trend  Transaminitis: Cholestatic picture.  Likely secondary to metastatic disease in liver.  We will continue to monitor CMP.She has mild elevation of AST on baseline.    DVT prophylaxis: SCD Code Status: Full Family Communication: None Disposition Plan: Home vs Hospice   Consultants: Surgery, oncology  Procedures: IR drainage of diverticular abscess  Antimicrobials:Vancomycin and Zosyn since 04/24/17  Subjective: Patient seen and examined the bedside this morning.  Appears much comfortable and was sitting in the chair.  Tolerating liquid diet.  Patient states she had a small bowel movement this morning.  Objective: Vitals:   04/26/17 0447 04/26/17 0803 04/26/17 0806 04/26/17 1311  BP: 115/65   110/61  Pulse: 98   (!) 102  Resp: 14   19  Temp: 98.3 F (36.8 C)   99.7 F (37.6 C)  TempSrc: Oral   Oral  SpO2: 98% 96% 96% 97%  Height:  Intake/Output Summary (Last 24 hours) at 04/26/2017 1416 Last  data filed at 04/26/2017 1312 Gross per 24 hour  Intake 2585 ml  Output 1385 ml  Net 1200 ml   There were no vitals filed for this visit.  Examination:  General exam: Not in distress,Looked more comfortable today,average built Respiratory system: Bilateral equal air entry, normal vesicular breath sounds, no wheezes or crackles  Cardiovascular system: S1 & S2 heard, RRR. No JVD, murmurs, rubs, gallops or clicks. No pedal edema, Port-A-Cath on the right chest Gastrointestinal system: Abdomen is mildly distended, soft and generalized tenderness .No organomegaly or masses felt. Normal bowel sounds heard.  Drain present [employer] system: Alert and oriented. No focal neurological deficits. Extremities: No edema, no clubbing ,no cyanosis, distal peripheral pulses palpable. Skin: No rashes, lesions or ulcers,no icterus ,no pallor Psychiatry: Judgement and insight appear normal. Mood & affect appropriate.     Data Reviewed: I have personally reviewed following labs and imaging studies  CBC: Recent Labs  Lab 04/24/17 1544 04/25/17 0459 04/26/17 0449  WBC 19.8* 15.4* 14.2*  NEUTROABS 17.6*  --  12.5*  HGB 10.2* 8.3* 8.1*  HCT 29.2* 23.9* 23.3*  MCV 102.5* 103.9* 105.4*  PLT 176 145* 389*   Basic Metabolic Panel: Recent Labs  Lab 04/24/17 1544 04/25/17 0459 04/26/17 0449  NA 132* 134* 135  K 3.6 3.7 3.6  CL 93* 101 102  CO2 25 24 25   GLUCOSE 131* 89 78  BUN 17 16 14   CREATININE 0.91 0.89 0.97  CALCIUM 10.2 9.6 9.6   GFR: Estimated Creatinine Clearance: 68.2 mL/min (by C-G formula based on SCr of 0.97 mg/dL). Liver Function Tests: Recent Labs  Lab 04/24/17 1544 04/25/17 0459 04/26/17 0449  AST 290* 247* 258*  ALT 46 37 32  ALKPHOS 559* 447* 446*  BILITOT 4.7* 4.3* 3.4*  PROT 6.8 6.3* 5.6*  ALBUMIN 2.7* 2.4* 2.0*   Recent Labs  Lab 04/24/17 1544  LIPASE 20   No results for input(s): AMMONIA in the last 168 hours. Coagulation Profile: Recent Labs  Lab  04/25/17 1027  INR 1.15   Cardiac Enzymes: No results for input(s): CKTOTAL, CKMB, CKMBINDEX, TROPONINI in the last 168 hours. BNP (last 3 results) No results for input(s): PROBNP in the last 8760 hours. HbA1C: No results for input(s): HGBA1C in the last 72 hours. CBG: No results for input(s): GLUCAP in the last 168 hours. Lipid Profile: No results for input(s): CHOL, HDL, LDLCALC, TRIG, CHOLHDL, LDLDIRECT in the last 72 hours. Thyroid Function Tests: No results for input(s): TSH, T4TOTAL, FREET4, T3FREE, THYROIDAB in the last 72 hours. Anemia Panel: No results for input(s): VITAMINB12, FOLATE, FERRITIN, TIBC, IRON, RETICCTPCT in the last 72 hours. Sepsis Labs: Recent Labs  Lab 04/24/17 1754  LATICACIDVEN 1.27    Recent Results (from the past 240 hour(s))  Blood culture (routine x 2)     Status: None (Preliminary result)   Collection Time: 04/24/17  5:00 PM  Result Value Ref Range Status   Specimen Description BLOOD CHEST PORTA CATH  Final   Special Requests   Final    BOTTLES DRAWN AEROBIC AND ANAEROBIC Blood Culture adequate volume   Culture   Final    NO GROWTH < 24 HOURS Performed at [facility], [place] [address], [place], [place] [address]    Report Status PENDING  Incomplete  Blood culture (routine x 2)     Status: None (Preliminary result)   Collection Time: 04/24/17  5:45 PM  Result Value Ref Range Status   Specimen Description BLOOD CHEST PORTA CATH  Final   Special Requests   Final    BOTTLES DRAWN AEROBIC AND ANAEROBIC Blood Culture results may not be optimal due to an excessive volume of blood received in culture bottles   Culture   Final    NO GROWTH < 24 HOURS Performed at [facility] [address], [place], [place] [address]    Report Status PENDING  Incomplete  Urine culture     Status: None   Collection Time: 04/24/17  9:18 PM  Result Value Ref Range Status   Specimen Description URINE, CATHETERIZED  Final   Special Requests NONE   Final   Culture   Final    NO GROWTH Performed at [facility], [address], [place], [place] [address]    Report Status 04/26/2017 FINAL  Final  MRSA PCR Screening     Status: None   Collection Time: 04/25/17 12:50 PM  Result Value Ref Range Status   MRSA by PCR NEGATIVE NEGATIVE Final    Comment:        The GeneXpert MRSA Assay (FDA approved for NASAL specimens only), is one component of a comprehensive MRSA colonization surveillance program. It is not intended to diagnose MRSA infection nor to guide or monitor treatment for MRSA infections.   Aerobic/Anaerobic Culture (surgical/deep wound)     Status: None (Preliminary result)   Collection Time: 04/25/17  2:55 PM  Result Value Ref Range Status   Specimen Description ABSCESS  Final   Special Requests Normal  Final   Gram Stain   Final    ABUNDANT WBC PRESENT,BOTH PMN AND MONONUCLEAR ABUNDANT GRAM POSITIVE COCCI IN PAIRS MODERATE GRAM VARIABLE ROD    Culture   Final    CULTURE REINCUBATED FOR BETTER GROWTH Performed at [facility], [place] [address], [place], [place] [address]    Report Status PENDING  Incomplete         Radiology Studies: Dg Chest 2 View  Result Date: 04/24/2017 CLINICAL DATA:  Shortness of breath. All on immunotherapy for lung cancer. Ex-smoker. EXAM: CHEST  2 VIEW COMPARISON:  CT 03/09/2017.  Plain film of 01/09/2017. FINDINGS: Lateral view degraded by patient arm position. Right Port-A-Cath terminates at the low SVC. Midline trachea. Normal heart size. New small left pleural effusion. Probable treatment effects in the left upper lobe laterally. Clear right lung. Left base subsegmental atelectasis. IMPRESSION: New small left pleural effusion with adjacent left base subsegmental atelectasis. Electronically Signed   By: Abigail Miyamoto M.D.   On: 04/24/2017 16:21   Ct Angio Chest Pe W And/or Wo Contrast  Result Date: 04/24/2017 CLINICAL DATA:  Extensive stage small cell left lung  carcinoma with brain metastases diagnosed July 2017. History of palliative radiotherapy of an enlarging left upper lobe pulmonary nodule. Ongoing medical therapy. Patient presents with generalized abdominal pain and clinical concern for pulmonary embolism. EXAM: CT ANGIOGRAPHY CHEST CT ABDOMEN AND PELVIS WITH CONTRAST TECHNIQUE: Multidetector CT imaging of the chest was performed using the standard protocol during bolus administration of intravenous contrast. Multiplanar CT image reconstructions and MIPs were obtained to evaluate the vascular anatomy. Multidetector CT imaging of the abdomen and pelvis was performed using the standard protocol during bolus administration of intravenous contrast. CONTRAST:  < 100 cc > ISOVUE-370 IOPAMIDOL (ISOVUE-370) INJECTION 76% COMPARISON:  03/09/2017 CT chest and abdomen. Chest radiograph from earlier today. 10/15/2016 CT abdomen/pelvis. FINDINGS: CTA CHEST FINDINGS  Cardiovascular: The study is moderate quality for the evaluation of pulmonary embolism, with evaluation of the subsegmental vessels limited by motion. There are no filling defects in the central, lobar, segmental or subsegmental pulmonary artery branches to suggest acute pulmonary embolism. Mildly atherosclerotic nonaneurysmal thoracic aorta. Normal caliber pulmonary arteries. Normal heart size. No significant pericardial fluid/thickening. Right internal jugular MediPort terminates in the right atrium. Mediastinum/Nodes: No discrete thyroid nodules. Unremarkable esophagus. No axillary adenopathy. Newly enlarged 2.0 cm right supraclavicular node (series 7/image 8). Increased right paratracheal adenopathy measuring up to 2.1 cm (series 7/ image 24), previously 1.2 cm. Increased prevascular bilateral mediastinal adenopathy measuring up to 2.0 cm on the right (series 7/ image 26), previously 1.3 cm. Increased 1.9 cm enlarged subcarinal node (series 7/ image 42), previously 1.1 cm. Newly enlarged 1.8 cm AP window node  (series 7/ image 35). Newly moderate infiltrative left hilar adenopathy measuring up to 2.0 cm (series 7/ image 48), with associated extrinsic narrowing of segmental lingular and left lower lobe airways. No right hilar adenopathy. Lungs/Pleura: No pneumothorax. No right pleural effusion. New small dependent left pleural effusion. Mild centrilobular emphysema with mild diffuse bronchial wall thickening. Stable bandlike consolidation in the anterior left upper lobe compatible with radiation fibrosis. Stable irregular solid 1.5 cm left upper lobe pulmonary nodule (series 13/image 40). New patchy consolidation, parenchymal banding and nodularity in the posterior left upper lobe and anterior left lower lobe. A few new scattered solid pulmonary nodules in the right lung, largest 6 mm in the anterior right middle lobe (series 13/ image 76). Musculoskeletal: No aggressive appearing focal osseous lesions. Moderate thoracic spondylosis. Review of the MIP images confirms the above findings. CT ABDOMEN and PELVIS FINDINGS Hepatobiliary: New hepatomegaly. Innumerable hypoenhancing masses replacing much of the liver, significantly increased in size and number, for example a 2.0 cm superior liver mass (series 4/ image 15), increased from 0.7 cm. Normal gallbladder with no radiopaque cholelithiasis. No biliary ductal dilatation. Pancreas: Normal, with no mass or duct dilation. Spleen: Normal size. No mass. Adrenals/Urinary Tract: Newly apparent 1.4 cm left adrenal nodule with density 64 HU. No right adrenal nodule. No hydronephrosis. Subcentimeter hypodense right renal cortical lesion is too small to characterize and is stable. No new renal lesions. Normal bladder. Stomach/Bowel: Normal non-distended stomach. Normal appendix. Mild sigmoid diverticulosis. There is a gas containing 6.0 x 4.9 x 7.2 cm multilocular abscess in the ventral left lower abdomen (series 4/image 66) with thick enhancing wall, which appears to demonstrate a  fistulous connection to the proximal sigmoid colon (series 15/image 70), and which is intimately associated with multiple small bowel loops. No small bowel dilatation. No focal small bowel caliber transition. No definite small bowel wall thickening. Vascular/Lymphatic: Atherosclerotic nonaneurysmal abdominal aorta. Patent portal, splenic, hepatic and renal veins. No pathologically enlarged lymph nodes in the abdomen or pelvis. Reproductive: Status post hysterectomy, with no abnormal findings at the vaginal cuff. No adnexal mass. Other: No ascites. Musculoskeletal: Previously visualized faintly sclerotic lesions throughout the lumbar vertebral bodies are not discretely visualized on today's scan. Mild lumbar spondylosis. Review of the MIP images confirms the above findings. IMPRESSION: 1. No evidence of pulmonary embolism. 2. Complex gas-containing multilocular abscess in the anterior left lower abdomen with fistulous connection to the proximal sigmoid colon (suggesting the sequela of perforated diverticulitis), intimately associated with multiple small bowel loops. No evidence of bowel obstruction. 3. Significant progression of metastatic disease. Right supraclavicular, bilateral mediastinal and left hilar adenopathy is significantly increased. 4. New extrinsic narrowing of  segmental left upper and left lower lobe airways by the infiltrative left hilar adenopathy. New patchy consolidation and nodularity in the posterior left upper lobe and anterior left lower lobe, favor a combination of postobstructive pneumonia and tumor. New scattered probable small pulmonary metastases in the right lung. 5. Liver metastases are significantly increased in size and number with new hepatomegaly. 6. New left adrenal metastasis. 7. New small dependent left pleural effusion. 8. Aortic Atherosclerosis (ICD10-I70.0) and Emphysema (ICD10-J43.9). Electronically Signed   By: Ilona Sorrel M.D.   On: 04/24/2017 18:01   Ct Abdomen Pelvis W  Contrast  Result Date: 04/24/2017 CLINICAL DATA:  Extensive stage small cell left lung carcinoma with brain metastases diagnosed July 2017. History of palliative radiotherapy of an enlarging left upper lobe pulmonary nodule. Ongoing medical therapy. Patient presents with generalized abdominal pain and clinical concern for pulmonary embolism. EXAM: CT ANGIOGRAPHY CHEST CT ABDOMEN AND PELVIS WITH CONTRAST TECHNIQUE: Multidetector CT imaging of the chest was performed using the standard protocol during bolus administration of intravenous contrast. Multiplanar CT image reconstructions and MIPs were obtained to evaluate the vascular anatomy. Multidetector CT imaging of the abdomen and pelvis was performed using the standard protocol during bolus administration of intravenous contrast. CONTRAST:  < 100 cc > ISOVUE-370 IOPAMIDOL (ISOVUE-370) INJECTION 76% COMPARISON:  03/09/2017 CT chest and abdomen. Chest radiograph from earlier today. 10/15/2016 CT abdomen/pelvis. FINDINGS: CTA CHEST FINDINGS Cardiovascular: The study is moderate quality for the evaluation of pulmonary embolism, with evaluation of the subsegmental vessels limited by motion. There are no filling defects in the central, lobar, segmental or subsegmental pulmonary artery branches to suggest acute pulmonary embolism. Mildly atherosclerotic nonaneurysmal thoracic aorta. Normal caliber pulmonary arteries. Normal heart size. No significant pericardial fluid/thickening. Right internal jugular MediPort terminates in the right atrium. Mediastinum/Nodes: No discrete thyroid nodules. Unremarkable esophagus. No axillary adenopathy. Newly enlarged 2.0 cm right supraclavicular node (series 7/image 8). Increased right paratracheal adenopathy measuring up to 2.1 cm (series 7/ image 24), previously 1.2 cm. Increased prevascular bilateral mediastinal adenopathy measuring up to 2.0 cm on the right (series 7/ image 26), previously 1.3 cm. Increased 1.9 cm enlarged  subcarinal node (series 7/ image 42), previously 1.1 cm. Newly enlarged 1.8 cm AP window node (series 7/ image 35). Newly moderate infiltrative left hilar adenopathy measuring up to 2.0 cm (series 7/ image 48), with associated extrinsic narrowing of segmental lingular and left lower lobe airways. No right hilar adenopathy. Lungs/Pleura: No pneumothorax. No right pleural effusion. New small dependent left pleural effusion. Mild centrilobular emphysema with mild diffuse bronchial wall thickening. Stable bandlike consolidation in the anterior left upper lobe compatible with radiation fibrosis. Stable irregular solid 1.5 cm left upper lobe pulmonary nodule (series 13/image 40). New patchy consolidation, parenchymal banding and nodularity in the posterior left upper lobe and anterior left lower lobe. A few new scattered solid pulmonary nodules in the right lung, largest 6 mm in the anterior right middle lobe (series 13/ image 76). Musculoskeletal: No aggressive appearing focal osseous lesions. Moderate thoracic spondylosis. Review of the MIP images confirms the above findings. CT ABDOMEN and PELVIS FINDINGS Hepatobiliary: New hepatomegaly. Innumerable hypoenhancing masses replacing much of the liver, significantly increased in size and number, for example a 2.0 cm superior liver mass (series 4/ image 15), increased from 0.7 cm. Normal gallbladder with no radiopaque cholelithiasis. No biliary ductal dilatation. Pancreas: Normal, with no mass or duct dilation. Spleen: Normal size. No mass. Adrenals/Urinary Tract: Newly apparent 1.4 cm left adrenal nodule with density  64 HU. No right adrenal nodule. No hydronephrosis. Subcentimeter hypodense right renal cortical lesion is too small to characterize and is stable. No new renal lesions. Normal bladder. Stomach/Bowel: Normal non-distended stomach. Normal appendix. Mild sigmoid diverticulosis. There is a gas containing 6.0 x 4.9 x 7.2 cm multilocular abscess in the ventral left  lower abdomen (series 4/image 66) with thick enhancing wall, which appears to demonstrate a fistulous connection to the proximal sigmoid colon (series 15/image 70), and which is intimately associated with multiple small bowel loops. No small bowel dilatation. No focal small bowel caliber transition. No definite small bowel wall thickening. Vascular/Lymphatic: Atherosclerotic nonaneurysmal abdominal aorta. Patent portal, splenic, hepatic and renal veins. No pathologically enlarged lymph nodes in the abdomen or pelvis. Reproductive: Status post hysterectomy, with no abnormal findings at the vaginal cuff. No adnexal mass. Other: No ascites. Musculoskeletal: Previously visualized faintly sclerotic lesions throughout the lumbar vertebral bodies are not discretely visualized on today's scan. Mild lumbar spondylosis. Review of the MIP images confirms the above findings. IMPRESSION: 1. No evidence of pulmonary embolism. 2. Complex gas-containing multilocular abscess in the anterior left lower abdomen with fistulous connection to the proximal sigmoid colon (suggesting the sequela of perforated diverticulitis), intimately associated with multiple small bowel loops. No evidence of bowel obstruction. 3. Significant progression of metastatic disease. Right supraclavicular, bilateral mediastinal and left hilar adenopathy is significantly increased. 4. New extrinsic narrowing of segmental left upper and left lower lobe airways by the infiltrative left hilar adenopathy. New patchy consolidation and nodularity in the posterior left upper lobe and anterior left lower lobe, favor a combination of postobstructive pneumonia and tumor. New scattered probable small pulmonary metastases in the right lung. 5. Liver metastases are significantly increased in size and number with new hepatomegaly. 6. New left adrenal metastasis. 7. New small dependent left pleural effusion. 8. Aortic Atherosclerosis (ICD10-I70.0) and Emphysema (ICD10-J43.9).  Electronically Signed   By: Ilona Sorrel M.D.   On: 04/24/2017 18:01   Ct Image Guided Drainage By Percutaneous Catheter  Result Date: 04/25/2017 INDICATION: History of metastatic small cell lung cancer with findings worrisome for advanced metastatic disease. Unfortunately, patient has developed a concomitant presumed diverticular abscess within the ventral aspect of the left lower abdomen. Given poor operative candidacy, request made for CT-guided percutaneous drainage catheter placement for infection source control purposes. EXAM: CT IMAGE GUIDED DRAINAGE BY PERCUTANEOUS CATHETER COMPARISON:  CT abdomen and pelvis - 04/24/2017 MEDICATIONS: The patient is currently admitted to the hospital and receiving intravenous antibiotics. The antibiotics were administered within an appropriate time frame prior to the initiation of the procedure. ANESTHESIA/SEDATION: Moderate (conscious) sedation was employed during this procedure. A total of Versed 2 mg and Fentanyl 100 mcg was administered intravenously. Moderate Sedation Time: 13 minutes. The patient's level of consciousness and vital signs were monitored continuously by radiology nursing throughout the procedure under my direct supervision. CONTRAST:  None COMPLICATIONS: None immediate. PROCEDURE: Informed written consent was obtained from the patient after a discussion of the risks, benefits and alternatives to treatment. The patient was placed supine on the CT gantry and a pre procedural CT was performed re-demonstrating the known abscess/fluid collection within the ventral aspect of the left lower abdomen/ pelvis with dominant air in fluid containing component measuring approximately 6.8 x 4.7 cm (image 18, series 2). The procedure was planned. A timeout was performed prior to the initiation of the procedure. The skin overlying the ventral aspect of the left lower abdomen was prepped and draped in the usual sterile  fashion. The overlying soft tissues were  anesthetized with 1% lidocaine with epinephrine. Appropriate trajectory was planned with the use of a 22 gauge spinal needle. An 18 gauge trocar needle was advanced into the abscess/fluid collection and a short Amplatz super stiff wire was coiled within the collection. Appropriate positioning was confirmed with a limited CT scan. The tract was serially dilated allowing placement of a 10 [place] all-purpose drainage catheter. Appropriate positioning was confirmed with a limited postprocedural CT scan. Approximately 80 Ml of purulent fluid was aspirated. The tube was connected to a drainage bag and sutured in place. A dressing was placed. The patient tolerated the procedure well without immediate post procedural complication. IMPRESSION: Successful CT guided placement of a 10 [place] all purpose drain catheter into the presumed diverticular abscess within the ventral aspect the left lower abdomen with aspiration of 80 mL of purulent fluid. Samples were sent to the laboratory as requested by the ordering clinical team. Electronically Signed   By: Sandi Mariscal M.D.   On: 04/25/2017 14:55        Scheduled Meds: . ferrous sulfate  325 mg Oral Daily  . fluticasone  1 spray Each Nare Daily  . fluticasone furoate-vilanterol  1 puff Inhalation Daily  . ipratropium-albuterol  3 mL Nebulization BID  . magnesium oxide  400 mg Oral QID  . montelukast  10 mg Oral QHS  . potassium chloride  20 mEq Oral BID  . senna  1 tablet Oral Daily  . sodium chloride flush  5 mL Intravenous Q8H   Continuous Infusions: . sodium chloride 50 mL/hr at 04/26/17 1335  . piperacillin-tazobactam (ZOSYN)  IV Stopped (04/26/17 1316)  . vancomycin 1,000 mg (04/25/17 1750)     LOS: 2 days    Time spent: 25 minutes     Kamarii Buren Jodie Echevaria, MD [facility] Pager [phone]  If 7PM-7AM, please contact night-coverage [email or web address] Password [email or web address] 04/26/2017, 2:16 PM

## 2017-04-27 DIAGNOSIS — Z7189 Other specified counseling: Secondary | ICD-10-CM

## 2017-04-27 DIAGNOSIS — Z515 Encounter for palliative care: Secondary | ICD-10-CM

## 2017-04-27 LAB — CBC WITH DIFFERENTIAL/PLATELET
BASOS ABS: 0 10*3/uL (ref 0.0–0.1)
BASOS PCT: 0 %
EOS PCT: 1 %
Eosinophils Absolute: 0.1 10*3/uL (ref 0.0–0.7)
HCT: 22.3 % — ABNORMAL LOW (ref 36.0–46.0)
Hemoglobin: 7.7 g/dL — ABNORMAL LOW (ref 12.0–15.0)
LYMPHS PCT: 11 %
Lymphs Abs: 1.2 10*3/uL (ref 0.7–4.0)
MCH: 36.3 pg — ABNORMAL HIGH (ref 26.0–34.0)
MCHC: 34.5 g/dL (ref 30.0–36.0)
MCV: 105.2 fL — AB (ref 78.0–100.0)
MONO ABS: 0.2 10*3/uL (ref 0.1–1.0)
MONOS PCT: 1 %
NEUTROS ABS: 9.7 10*3/uL — AB (ref 1.7–7.7)
Neutrophils Relative %: 87 %
PLATELETS: 125 10*3/uL — AB (ref 150–400)
RBC: 2.12 MIL/uL — ABNORMAL LOW (ref 3.87–5.11)
RDW: 18.2 % — AB (ref 11.5–15.5)
WBC: 11.2 10*3/uL — ABNORMAL HIGH (ref 4.0–10.5)

## 2017-04-27 LAB — COMPREHENSIVE METABOLIC PANEL
ALT: 31 U/L (ref 14–54)
ANION GAP: 7 (ref 5–15)
AST: 234 U/L — ABNORMAL HIGH (ref 15–41)
Albumin: 2 g/dL — ABNORMAL LOW (ref 3.5–5.0)
Alkaline Phosphatase: 445 U/L — ABNORMAL HIGH (ref 38–126)
BUN: 11 mg/dL (ref 6–20)
CHLORIDE: 102 mmol/L (ref 101–111)
CO2: 25 mmol/L (ref 22–32)
Calcium: 9.4 mg/dL (ref 8.9–10.3)
Creatinine, Ser: 0.89 mg/dL (ref 0.44–1.00)
GFR calc Af Amer: >60 mL/min (ref >60)
GFR calc non Af Amer: >60 mL/min (ref >60)
Glucose, Bld: 72 mg/dL (ref 65–99)
POTASSIUM: 3.8 mmol/L (ref 3.5–5.1)
Sodium: 134 mmol/L — ABNORMAL LOW (ref 135–145)
TOTAL PROTEIN: 5.4 g/dL — AB (ref 6.5–8.1)
Total Bilirubin: 2.8 mg/dL — ABNORMAL HIGH (ref 0.3–1.2)

## 2017-04-27 NOTE — Progress Notes (Signed)
Patient ID: Sharon [name], female   DOB: 1955-10-01, 62 y.o.   MRN: [id number]    Referring Physician(s): Dr. Michael Boston  Supervising Physician: Daryll Brod  Patient Status: [facility] - In-pt  Chief Complaint: Diverticular abscess  Subjective: Patient feels much better with much less pain since drain placed.  Allergies: Patient has no known allergies.  Medications: Prior to Admission medications   Medication Sig Start Date End Date Taking? Authorizing Provider  albuterol (PROVENTIL HFA;VENTOLIN HFA) 108 (90 Base) MCG/ACT inhaler Inhale 1 puff into the lungs every 6 (six) hours as needed for wheezing or shortness of breath.   Yes [provider]  budesonide-formoterol (SYMBICORT) 160-4.5 MCG/ACT inhaler Inhale 2 puffs into the lungs 2 (two) times daily. 08/17/16  Yes Javier Glazier, MD  ferrous sulfate 325 (65 FE) MG EC tablet Take 325 mg by mouth daily.   Yes [provider]  fluticasone (FLONASE) 50 MCG/ACT nasal spray Place 1 spray into both nostrils daily.  08/20/16  Yes [provider]  Ipratropium-Albuterol (COMBIVENT RESPIMAT) 20-100 MCG/ACT AERS respimat Inhale 1 puff into the lungs every 6 (six) hours.   Yes [provider]  loperamide (IMODIUM) 2 MG capsule Take by mouth as needed for diarrhea or loose stools.   Yes [provider]  LORazepam (ATIVAN) 0.5 MG tablet 1 tablet po 30 minutes prior to radiation or MRI 09/28/16  Yes Hayden Pedro, PA-C  magnesium oxide (MAG-OX) 400 (241.3 Mg) MG tablet Take 1 tablet (400 mg total) by mouth 4 (four) times daily. 01/26/17  Yes Curcio, Roselie Awkward, NP  meclizine (ANTIVERT) 12.5 MG tablet Take 1-2 tablets (12.5-25 mg total) by mouth 3 (three) times daily as needed for dizziness. 06/05/16  Yes Virgel Manifold, MD  methylPREDNISolone (MEDROL) 4 MG tablet Take 6 tabs on day 1, 5 tabs on day 2, 4 tabs on day 3, 3 tabs on day 4, 2 tabs on day 5, 1 tab on day 6, and then stop 04/13/17  Yes Curcio,  Roselie Awkward, NP  montelukast (SINGULAIR) 10 MG tablet Take 10 mg by mouth at bedtime.  09/06/16  Yes [provider]  ondansetron (ZOFRAN) 8 MG tablet Take 1 tablet (8 mg total) every 8 (eight) hours as needed by mouth for nausea or vomiting. 03/11/17  Yes Curcio, Roselie Awkward, NP  polyethylene glycol powder (GLYCOLAX/MIRALAX) powder Take 1 Container by mouth daily.   Yes [provider]  potassium chloride 20 MEQ/15ML (10%) SOLN Take 15 mLs (20 mEq total) by mouth 2 (two) times daily. 02/25/17  Yes Curcio, Roselie Awkward, NP  prochlorperazine (COMPAZINE) 10 MG tablet TAKE 1 TABLET BY MOUTH EVERY 6 HOURS AS NEEDED FOR NAUSEA AND VOMITING 02/11/17  Yes Curcio, Roselie Awkward, NP  SANTYL ointment APP TOPICALLY UTD 12/08/16  Yes [provider]  senna (SENOKOT) 8.6 MG TABS tablet Take 1 tablet (8.6 mg total) by mouth daily. 10/17/16  Yes Patrecia Pour, MD  sucralfate (CARAFATE) 1 GM/10ML suspension Take 10 mLs (1 g total) by mouth 4 (four) times daily -  with meals and at bedtime. 01/19/17  Yes Gery Pray, MD  temozolomide (TEMODAR) 140 MG capsule Take 2 capsules (280 mg total) daily by mouth. Take for days 1-5 every 28 days. May take on empty stomach & at bedtime to decrease N&V. 03/15/17  Yes Curt Bears, MD  UNABLE TO FIND Take 10 mLs by mouth daily as needed. Med Name: CBD Oil   Yes [provider]  Vital Signs: BP (!) 104/59 (BP Location: Right Arm)   Pulse (!) 103   Temp 97.7 F (36.5 C) (Oral)   Resp 20   Ht 5\' 7"  (1.702 m)   Wt 181 lb 3.5 oz (82.2 kg)   SpO2 90%   BMI 28.38 kg/m   Physical Exam: Abd: soft, drain with minimal output currently.  Residual bloody output present. 50cc output in 24 hrs  Imaging: Dg Chest 2 View  Result Date: 04/24/2017 CLINICAL DATA:  Shortness of breath. All on immunotherapy for lung cancer. Ex-smoker. EXAM: CHEST  2 VIEW COMPARISON:  CT 03/09/2017.  Plain film of 01/09/2017. FINDINGS: Lateral view degraded by patient arm  position. Right Port-A-Cath terminates at the low SVC. Midline trachea. Normal heart size. New small left pleural effusion. Probable treatment effects in the left upper lobe laterally. Clear right lung. Left base subsegmental atelectasis. IMPRESSION: New small left pleural effusion with adjacent left base subsegmental atelectasis. Electronically Signed   By: Abigail Miyamoto M.D.   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 History of palliative radiotherapy of an enlarging left upper lobe pulmonary nodule. Ongoing medical therapy. Patient presents with generalized abdominal pain and clinical concern for pulmonary embolism. EXAM: CT ANGIOGRAPHY CHEST CT ABDOMEN AND PELVIS WITH CONTRAST TECHNIQUE: Multidetector CT imaging of the chest was performed using the standard protocol during bolus administration of intravenous contrast. Multiplanar CT image reconstructions and MIPs were obtained to evaluate the vascular anatomy. Multidetector CT imaging of the abdomen and pelvis was performed using the standard protocol during bolus administration of intravenous contrast. CONTRAST:  < 100 cc > ISOVUE-370 IOPAMIDOL (ISOVUE-370) INJECTION 76% COMPARISON:  03/09/2017 CT chest and abdomen. Chest radiograph from earlier today. 10/15/2016 CT abdomen/pelvis. FINDINGS: CTA CHEST FINDINGS Cardiovascular: The study is moderate quality for the evaluation of pulmonary embolism, with evaluation of the subsegmental vessels limited by motion. There are no filling defects in the central, lobar, segmental or subsegmental pulmonary artery branches to suggest acute pulmonary embolism. Mildly atherosclerotic nonaneurysmal thoracic aorta. Normal caliber pulmonary arteries. Normal heart size. No significant pericardial fluid/thickening. Right internal jugular MediPort terminates in the right atrium.  Mediastinum/Nodes: No discrete thyroid nodules. Unremarkable esophagus. No axillary adenopathy. Newly enlarged 2.0 cm right supraclavicular node (series 7/image 8). Increased right paratracheal adenopathy measuring up to 2.1 cm (series 7/ image 24), previously 1.2 cm. Increased prevascular bilateral mediastinal adenopathy measuring up to 2.0 cm on the right (series 7/ image 26), previously 1.3 cm. Increased 1.9 cm enlarged subcarinal node (series 7/ image 42), previously 1.1 cm. Newly enlarged 1.8 cm AP window node (series 7/ image 35). Newly moderate infiltrative left hilar adenopathy measuring up to 2.0 cm (series 7/ image 48), with associated extrinsic narrowing of segmental lingular and left lower lobe airways. No right hilar adenopathy. Lungs/Pleura: No pneumothorax. No right pleural effusion. New small dependent left pleural effusion. Mild centrilobular emphysema with mild diffuse bronchial wall thickening. Stable bandlike consolidation in the anterior left upper lobe compatible with radiation fibrosis. Stable irregular solid 1.5 cm left upper lobe pulmonary nodule (series 13/image 40). New patchy consolidation, parenchymal banding and nodularity in the posterior left upper lobe and anterior left lower lobe. A few new scattered solid pulmonary nodules in the right lung, largest 6 mm in the anterior right middle lobe (series 13/ image 76). Musculoskeletal: No aggressive appearing focal osseous  lesions. Moderate thoracic spondylosis. Review of the MIP images confirms the above findings. CT ABDOMEN and PELVIS FINDINGS Hepatobiliary: New hepatomegaly. Innumerable hypoenhancing masses replacing much of the liver, significantly increased in size and number, for example a 2.0 cm superior liver mass (series 4/ image 15), increased from 0.7 cm. Normal gallbladder with no radiopaque cholelithiasis. No biliary ductal dilatation. Pancreas: Normal, with no mass or duct dilation. Spleen: Normal size. No mass.  Adrenals/Urinary Tract: Newly apparent 1.4 cm left adrenal nodule with density 64 HU. No right adrenal nodule. No hydronephrosis. Subcentimeter hypodense right renal cortical lesion is too small to characterize and is stable. No new renal lesions. Normal bladder. Stomach/Bowel: Normal non-distended stomach. Normal appendix. Mild sigmoid diverticulosis. There is a gas containing 6.0 x 4.9 x 7.2 cm multilocular abscess in the ventral left lower abdomen (series 4/image 66) with thick enhancing wall, which appears to demonstrate a fistulous connection to the proximal sigmoid colon (series 15/image 70), and which is intimately associated with multiple small bowel loops. No small bowel dilatation. No focal small bowel caliber transition. No definite small bowel wall thickening. Vascular/Lymphatic: Atherosclerotic nonaneurysmal abdominal aorta. Patent portal, splenic, hepatic and renal veins. No pathologically enlarged lymph nodes in the abdomen or pelvis. Reproductive: Status post hysterectomy, with no abnormal findings at the vaginal cuff. No adnexal mass. Other: No ascites. Musculoskeletal: Previously visualized faintly sclerotic lesions throughout the lumbar vertebral bodies are not discretely visualized on today's scan. Mild lumbar spondylosis. Review of the MIP images confirms the above findings. IMPRESSION: 1. No evidence of pulmonary embolism. 2. Complex gas-containing multilocular abscess in the anterior left lower abdomen with fistulous connection to the proximal sigmoid colon (suggesting the sequela of perforated diverticulitis), intimately associated with multiple small bowel loops. No evidence of bowel obstruction. 3. Significant progression of metastatic disease. Right supraclavicular, bilateral mediastinal and left hilar adenopathy is significantly increased. 4. New extrinsic narrowing of segmental left upper and left lower lobe airways by the infiltrative left hilar adenopathy. New patchy consolidation and  nodularity in the posterior left upper lobe and anterior left lower lobe, favor a combination of postobstructive pneumonia and tumor. New scattered probable small pulmonary metastases in the right lung. 5. Liver metastases are significantly increased in size and number with new hepatomegaly. 6. New left adrenal metastasis. 7. New small dependent left pleural effusion. 8. Aortic Atherosclerosis (ICD10-I70.0) and Emphysema (ICD10-J43.9). Electronically Signed   By: Ilona Sorrel M.D.   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 History of palliative radiotherapy of an enlarging left upper lobe pulmonary nodule. Ongoing medical therapy. Patient presents with generalized abdominal pain and clinical concern for pulmonary embolism. EXAM: CT ANGIOGRAPHY CHEST CT ABDOMEN AND PELVIS WITH CONTRAST TECHNIQUE: Multidetector CT imaging of the chest was performed using the standard protocol during bolus administration of intravenous contrast. Multiplanar CT image reconstructions and MIPs were obtained to evaluate the vascular anatomy. Multidetector CT imaging of the abdomen and pelvis was performed using the standard protocol during bolus administration of intravenous contrast. CONTRAST:  < 100 cc > ISOVUE-370 IOPAMIDOL (ISOVUE-370) INJECTION 76% COMPARISON:  03/09/2017 CT chest and abdomen. Chest radiograph from earlier today. 10/15/2016 CT abdomen/pelvis. FINDINGS: CTA CHEST FINDINGS Cardiovascular: The study is moderate quality for the evaluation of pulmonary embolism, with evaluation of the subsegmental vessels limited by motion. There are no filling defects in the central, lobar, segmental or subsegmental  pulmonary artery branches to suggest acute pulmonary embolism. Mildly atherosclerotic nonaneurysmal thoracic aorta. Normal caliber pulmonary arteries. Normal heart size. No  significant pericardial fluid/thickening. Right internal jugular MediPort terminates in the right atrium. Mediastinum/Nodes: No discrete thyroid nodules. Unremarkable esophagus. No axillary adenopathy. Newly enlarged 2.0 cm right supraclavicular node (series 7/image 8). Increased right paratracheal adenopathy measuring up to 2.1 cm (series 7/ image 24), previously 1.2 cm. Increased prevascular bilateral mediastinal adenopathy measuring up to 2.0 cm on the right (series 7/ image 26), previously 1.3 cm. Increased 1.9 cm enlarged subcarinal node (series 7/ image 42), previously 1.1 cm. Newly enlarged 1.8 cm AP window node (series 7/ image 35). Newly moderate infiltrative left hilar adenopathy measuring up to 2.0 cm (series 7/ image 48), with associated extrinsic narrowing of segmental lingular and left lower lobe airways. No right hilar adenopathy. Lungs/Pleura: No pneumothorax. No right pleural effusion. New small dependent left pleural effusion. Mild centrilobular emphysema with mild diffuse bronchial wall thickening. Stable bandlike consolidation in the anterior left upper lobe compatible with radiation fibrosis. Stable irregular solid 1.5 cm left upper lobe pulmonary nodule (series 13/image 40). New patchy consolidation, parenchymal banding and nodularity in the posterior left upper lobe and anterior left lower lobe. A few new scattered solid pulmonary nodules in the right lung, largest 6 mm in the anterior right middle lobe (series 13/ image 76). Musculoskeletal: No aggressive appearing focal osseous lesions. Moderate thoracic spondylosis. Review of the MIP images confirms the above findings. CT ABDOMEN and PELVIS FINDINGS Hepatobiliary: New hepatomegaly. Innumerable hypoenhancing masses replacing much of the liver, significantly increased in size and number, for example a 2.0 cm superior liver mass (series 4/ image 15), increased from 0.7 cm. Normal gallbladder with no radiopaque cholelithiasis. No biliary ductal  dilatation. Pancreas: Normal, with no mass or duct dilation. Spleen: Normal size. No mass. Adrenals/Urinary Tract: Newly apparent 1.4 cm left adrenal nodule with density 64 HU. No right adrenal nodule. No hydronephrosis. Subcentimeter hypodense right renal cortical lesion is too small to characterize and is stable. No new renal lesions. Normal bladder. Stomach/Bowel: Normal non-distended stomach. Normal appendix. Mild sigmoid diverticulosis. There is a gas containing 6.0 x 4.9 x 7.2 cm multilocular abscess in the ventral left lower abdomen (series 4/image 66) with thick enhancing wall, which appears to demonstrate a fistulous connection to the proximal sigmoid colon (series 15/image 70), and which is intimately associated with multiple small bowel loops. No small bowel dilatation. No focal small bowel caliber transition. No definite small bowel wall thickening. Vascular/Lymphatic: Atherosclerotic nonaneurysmal abdominal aorta. Patent portal, splenic, hepatic and renal veins. No pathologically enlarged lymph nodes in the abdomen or pelvis. Reproductive: Status post hysterectomy, with no abnormal findings at the vaginal cuff. No adnexal mass. Other: No ascites. Musculoskeletal: Previously visualized faintly sclerotic lesions throughout the lumbar vertebral bodies are not discretely visualized on today's scan. Mild lumbar spondylosis. Review of the MIP images confirms the above findings. IMPRESSION: 1. No evidence of pulmonary embolism. 2. Complex gas-containing multilocular abscess in the anterior left lower abdomen with fistulous connection to the proximal sigmoid colon (suggesting the sequela of perforated diverticulitis), intimately associated with multiple small bowel loops. No evidence of bowel obstruction. 3. Significant progression of metastatic disease. Right supraclavicular, bilateral mediastinal and left hilar adenopathy is significantly increased. 4. New extrinsic narrowing of segmental left upper and left  lower lobe airways by the infiltrative left hilar adenopathy. New patchy consolidation and nodularity in the posterior left upper lobe and anterior left lower lobe, favor a  combination of postobstructive pneumonia and tumor. New scattered probable small pulmonary metastases in the right lung. 5. Liver metastases are significantly increased in size and number with new hepatomegaly. 6. New left adrenal metastasis. 7. New small dependent left pleural effusion. 8. Aortic Atherosclerosis (ICD10-I70.0) and Emphysema (ICD10-J43.9). Electronically Signed   By: Ilona Sorrel M.D.   On: 04/24/2017 18:01   Ct Image Guided Drainage By Percutaneous Catheter  Result Date: 04/25/2017 INDICATION: History of metastatic small cell lung cancer with findings worrisome for advanced metastatic disease. Unfortunately, patient has developed a concomitant presumed diverticular abscess within the ventral aspect of the left lower abdomen. Given poor operative candidacy, request made for CT-guided percutaneous drainage catheter placement for infection source control purposes. EXAM: CT IMAGE GUIDED DRAINAGE BY PERCUTANEOUS CATHETER COMPARISON:  CT abdomen and pelvis - 04/24/2017 MEDICATIONS: The patient is currently admitted to the hospital and receiving intravenous antibiotics. The antibiotics were administered within an appropriate time frame prior to the initiation of the procedure. ANESTHESIA/SEDATION: Moderate (conscious) sedation was employed during this procedure. A total of Versed 2 mg and Fentanyl 100 mcg was administered intravenously. Moderate Sedation Time: 13 minutes. The patient's level of consciousness and vital signs were monitored continuously by radiology nursing throughout the procedure under my direct supervision. CONTRAST:  None COMPLICATIONS: None immediate. PROCEDURE: Informed written consent was obtained from the patient after a discussion of the risks, benefits and alternatives to treatment. The patient was placed  supine on the CT gantry and a pre procedural CT was performed re-demonstrating the known abscess/fluid collection within the ventral aspect of the left lower abdomen/ pelvis with dominant air in fluid containing component measuring approximately 6.8 x 4.7 cm (image 18, series 2). The procedure was planned. A timeout was performed prior to the initiation of the procedure. The skin overlying the ventral aspect of the left lower abdomen was prepped and draped in the usual sterile fashion. The overlying soft tissues were anesthetized with 1% lidocaine with epinephrine. Appropriate trajectory was planned with the use of a 22 gauge spinal needle. An 18 gauge trocar needle was advanced into the abscess/fluid collection and a short Amplatz super stiff wire was coiled within the collection. Appropriate positioning was confirmed with a limited CT scan. The tract was serially dilated allowing placement of a 10 [place] all-purpose drainage catheter. Appropriate positioning was confirmed with a limited postprocedural CT scan. Approximately 80 Ml of purulent fluid was aspirated. The tube was connected to a drainage bag and sutured in place. A dressing was placed. The patient tolerated the procedure well without immediate post procedural complication. IMPRESSION: Successful CT guided placement of a 10 [place] all purpose drain catheter into the presumed diverticular abscess within the ventral aspect the left lower abdomen with aspiration of 80 mL of purulent fluid. Samples were sent to the laboratory as requested by the ordering clinical team. Electronically Signed   By: Sandi Mariscal M.D.   On: 04/25/2017 14:55    Labs:  CBC: Recent Labs    04/24/17 1544 04/25/17 0459 04/26/17 0449 04/27/17 0443  WBC 19.8* 15.4* 14.2* 11.2*  HGB 10.2* 8.3* 8.1* 7.7*  HCT 29.2* 23.9* 23.3* 22.3*  PLT 176 145* 135* 125*    COAGS: Recent Labs    09/27/16 1611 02/01/17 1254 04/25/17 1027  INR 0.99 0.78 1.15  APTT 27  --   --      BMP: Recent Labs    04/24/17 1544 04/25/17 0459 04/26/17 0449 04/27/17 0443  NA 132* 134* 135 134*  K 3.6 3.7 3.6 3.8  CL 93* 101 102 102  CO2 25 24 25 25   GLUCOSE 131* 89 78 72  BUN 17 16 14 11   CALCIUM 10.2 9.6 9.6 9.4  CREATININE 0.91 0.89 0.97 0.89  GFRNONAA >60 >60 >60 >60  GFRAA >60 >60 >60 >60    LIVER FUNCTION TESTS: Recent Labs    04/24/17 1544 04/25/17 0459 04/26/17 0449 04/27/17 0443  BILITOT 4.7* 4.3* 3.4* 2.8*  AST 290* 247* 258* 234*  ALT 46 37 32 31  ALKPHOS 559* 447* 446* 445*  PROT 6.8 6.3* 5.6* 5.4*  ALBUMIN 2.7* 2.4* 2.0* 2.0*    Assessment and Plan: 1. Diverticular abscess, s/p perc drain  Patient improved and feeling much better since drain placement. CX shows strep viridans.  abx per primary service.  Cont with drain irrigations.  Will follow.  Electronically Signed: Henreitta Cea 04/27/2017, 4:19 PM   I spent a total of 15 Minutes at the the patient's bedside AND on the patient's hospital floor or unit, greater than 50% of which was counseling/coordinating care for diverticular abscess

## 2017-04-27 NOTE — Progress Notes (Signed)
Patient Demographics:    Sharon [name], is a 62 y.o. female, DOB - 1956-02-07, TKW:[id number]  Admit date - 04/24/2017   Admitting Physician Etta Quill, DO  Outpatient Primary MD for the patient is [name], PA  LOS - 3   Chief Complaint  Patient presents with  . Abdominal Pain        Subjective:    Sharon [name] today has no fevers, no emesis,  No chest pain,   Palliative care NP at bedside   Assessment  & Plan :    Principal Problem:   Diverticulitis of large intestine with perforation and abscess Active Problems:   COPD, severe ([name])   SCLC (small cell lung carcinoma), left ([name])   Transaminitis   Postobstructive pneumonia   History of antineoplastic chemotherapy   Maintenance antineoplastic immunotherapy   Dependence on supplemental oxygen   Liver metastasis ([name])   Abscess of abdominal cavity ([name])   Palliative care encounter  Brief Narrative: Patient is a 62 year old female with past medical history of small cell lung cancer with widespread metastatic disease.  Currently on immunotherapy.  Patient presented to the emergency department with complaints of abdominal pain.  CT chest/abdomen/pelvis done in the emergency department showed perforated diverticulitis with abscess with fistula and progression of cancer. S/p IR image guided drainage on 04/25/17   1)Diverticulitis of sigmoid colon with Perforation and Abscess: General Surgery following .  IR performed drainage of the abscess, abscess culture from 04/25/2017 growing strep viridans sensitive to PCN, Levaquin and vancomycin, discussed with pharmacist, there is a  possibility of an anaerobe as well in the abscess cultures from 04/25/17, we will narrow it down to Zosyn and get rid of the Vancomycin. So STOP Vanco and c/n Zosyn which was started on 04/24/2017. WBC is down to 11.2 from 19.8 on admission , advance to pured/full liquid  diet.     2)Metastatic small cell lung cancer with worsening disease: Case discussed with Dr. Julien Nordmann (oncologist), and has failed extensive aggressive systemic chemotherapy in the past, as well as palliative radiation therapy, She has H/O extensive stage small cell lung cancer status post 6 cycles of systemic chemotherapy with cisplatin and etoposide followed by prophylactic cranial irradiation followed by palliative radiotherapy to a left upper lobe pulmonary nodule followed by stereotactic radiotherapy to solitary brain metastasis. Her metastatic disease looks to have progressed.    Patient is not very likely to benefit from immunotherapy even if she is able to tolerate it.  Palliative care consult appreciated  3)Possible postoperative pneumonia: improved respiratory status, stop vancomycin as discussed above and continue Zosyn which was started on 04/24/2017   4)Transaminitis: Cholestatic picture.  Likely secondary to metastatic disease in liver.  We will continue to monitor CMP.She has mild elevation of AST on baseline.    DVT prophylaxis: SCD Code Status: Full Family Communication: None Disposition Plan: Home vs Hospice   Consultants: Surgery, oncology, palliative  Procedures: IR drainage of diverticular abscess on 04/25/17   DVT Prophylaxis  : SCDs   Lab Results  Component Value Date   PLT 125 (L) 04/27/2017    Inpatient Medications  Scheduled Meds: . ferrous sulfate  325 mg Oral Daily  . fluticasone  1 spray Each Nare Daily  .  fluticasone furoate-vilanterol  1 puff Inhalation Daily  . ipratropium-albuterol  3 mL Nebulization BID  . magnesium oxide  400 mg Oral QID  . montelukast  10 mg Oral QHS  . potassium chloride  20 mEq Oral BID  . senna  1 tablet Oral Daily  . sodium chloride flush  5 mL Intravenous Q8H   Continuous Infusions: . sodium chloride 50 mL/hr at 04/26/17 1901  . piperacillin-tazobactam (ZOSYN)  IV 3.375 g (04/27/17 1558)  . vancomycin  Stopped (04/26/17 1950)   PRN Meds:.acetaminophen **OR** acetaminophen, albuterol, meclizine, morphine injection, ondansetron **OR** ondansetron (ZOFRAN) IV, sodium chloride flush    Anti-infectives (From admission, onward)   Start     Dose/Rate Route Frequency Ordered Stop   04/25/17 1800  vancomycin (VANCOCIN) IVPB 1000 mg/200 mL premix     1,000 mg 200 mL/hr over 60 Minutes Intravenous Every 24 hours 04/24/17 2024     04/24/17 2359  piperacillin-tazobactam (ZOSYN) IVPB 3.375 g     3.375 g 12.5 mL/hr over 240 Minutes Intravenous Every 8 hours 04/24/17 2024     04/24/17 1715  piperacillin-tazobactam (ZOSYN) IVPB 3.375 g     3.375 g 100 mL/hr over 30 Minutes Intravenous  Once 04/24/17 1714 04/24/17 1815   04/24/17 1715  vancomycin (VANCOCIN) 1,500 mg in sodium chloride 0.9 % 500 mL IVPB     1,500 mg 250 mL/hr over 120 Minutes Intravenous  Once 04/24/17 1714 04/24/17 2010        Objective:   Vitals:   04/27/17 0455 04/27/17 1023 04/27/17 1026 04/27/17 1447  BP: 111/68   (!) 104/59  Pulse: (!) 102   (!) 103  Resp: 18   20  Temp: 98.1 F (36.7 C)   97.7 F (36.5 C)  TempSrc: Oral   Oral  SpO2: 97% 93% 93% 90%  Weight:      Height:        Wt Readings from Last 3 Encounters:  04/25/17 82.2 kg (181 lb 3.5 oz)  04/13/17 82.6 kg (182 lb)  03/29/17 84.6 kg (186 lb 9.6 oz)     Intake/Output Summary (Last 24 hours) at 04/27/2017 1823 Last data filed at 04/27/2017 1448 Gross per 24 hour  Intake 1065 ml  Output 1080 ml  Net -15 ml     Physical Exam  Gen:- Awake Alert,  In no apparent distress  HEENT:- [place].AT, No sclera icterus Neck-Supple Neck,No JVD,.  Lungs-  CTAB , right subclavian Port-A-Cath site is clean dry and intact CV- S1, S2 normal Abd-  +ve B.Sounds, Abd Soft, No tenderness, left lower quadrant drain with sanguinous drainage Extremity/Skin:- Neg homan's  Psych-affect is flat   Data Review:   Micro Results Recent Results (from the past 240 hour(s))    Blood culture (routine x 2)     Status: None (Preliminary result)   Collection Time: 04/24/17  5:00 PM  Result Value Ref Range Status   Specimen Description BLOOD CHEST PORTA CATH  Final   Special Requests   Final    BOTTLES DRAWN AEROBIC AND ANAEROBIC Blood Culture adequate volume   Culture   Final    NO GROWTH 3 DAYS Performed at [facility], [place] [address] [place], [place] [address]    Report Status PENDING  Incomplete  Blood culture (routine x 2)     Status: None (Preliminary result)   Collection Time: 04/24/17  5:45 PM  Result Value Ref Range Status   Specimen Description BLOOD CHEST PORTA CATH  Final   Special Requests   Final    BOTTLES DRAWN AEROBIC AND ANAEROBIC Blood Culture results may not be optimal due to an excessive volume of blood received in culture bottles   Culture   Final    NO GROWTH 3 DAYS Performed at [facility], [place] [address] [place], [place] [address]    Report Status PENDING  Incomplete  Urine culture     Status: None   Collection Time: 04/24/17  9:18 PM  Result Value Ref Range Status   Specimen Description URINE, CATHETERIZED  Final   Special Requests NONE  Final   Culture   Final    NO GROWTH Performed at [facility], [address], [place], [place] [address]    Report Status 04/26/2017 FINAL  Final  MRSA PCR Screening     Status: None   Collection Time: 04/25/17 12:50 PM  Result Value Ref Range Status   MRSA by PCR NEGATIVE NEGATIVE Final    Comment:        The GeneXpert MRSA Assay (FDA approved for NASAL specimens only), is one component of a comprehensive MRSA colonization surveillance program. It is not intended to diagnose MRSA infection nor to guide or monitor treatment for MRSA infections.   Aerobic/Anaerobic Culture (surgical/deep wound)     Status: None (Preliminary result)   Collection Time: 04/25/17  2:55 PM  Result Value Ref Range Status   Specimen Description ABSCESS  Final   Special Requests  Normal  Final   Gram Stain   Final    ABUNDANT WBC PRESENT,BOTH PMN AND MONONUCLEAR ABUNDANT GRAM POSITIVE COCCI IN PAIRS MODERATE GRAM VARIABLE ROD    Culture   Final    ABUNDANT VIRIDANS STREPTOCOCCUS RARE GRAM NEGATIVE RODS CULTURE REINCUBATED FOR BETTER GROWTH HOLDING FOR POSSIBLE ANAEROBE Performed at [facility], [place] [address], [place], [place] [address]    Report Status PENDING  Incomplete   Organism ID, Bacteria VIRIDANS STREPTOCOCCUS  Final      Susceptibility   Viridans streptococcus - MIC*    ERYTHROMYCIN <=0.12 SENSITIVE Sensitive     LEVOFLOXACIN 0.5 SENSITIVE Sensitive     VANCOMYCIN 0.5 SENSITIVE Sensitive     * ABUNDANT VIRIDANS STREPTOCOCCUS    Radiology Reports Dg Chest 2 View  Result Date: 04/24/2017 CLINICAL DATA:  Shortness of breath. All on immunotherapy for lung cancer. Ex-smoker. EXAM: CHEST  2 VIEW COMPARISON:  CT 03/09/2017.  Plain film of 01/09/2017. FINDINGS: Lateral view degraded by patient arm position. Right Port-A-Cath terminates at the low SVC. Midline trachea. Normal heart size. New small left pleural effusion. Probable treatment effects in the left upper lobe laterally. Clear right lung. Left base subsegmental atelectasis. IMPRESSION: New small left pleural effusion with adjacent left base subsegmental atelectasis. Electronically Signed   By: Abigail Miyamoto M.D.   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 History of palliative radiotherapy of an enlarging left upper lobe pulmonary nodule. Ongoing medical therapy. Patient presents with generalized abdominal pain and clinical concern for pulmonary embolism. EXAM: CT ANGIOGRAPHY CHEST CT ABDOMEN AND PELVIS WITH CONTRAST TECHNIQUE: Multidetector CT imaging of the chest was performed using the standard protocol during bolus administration of  intravenous contrast. Multiplanar CT image reconstructions and MIPs were obtained to evaluate the vascular anatomy. Multidetector CT imaging of the abdomen and  pelvis was performed using the standard protocol during bolus administration of intravenous contrast. CONTRAST:  < 100 cc > ISOVUE-370 IOPAMIDOL (ISOVUE-370) INJECTION 76% COMPARISON:  03/09/2017 CT chest and abdomen. Chest radiograph from earlier today. 10/15/2016 CT abdomen/pelvis. FINDINGS: CTA CHEST FINDINGS Cardiovascular: The study is moderate quality for the evaluation of pulmonary embolism, with evaluation of the subsegmental vessels limited by motion. There are no filling defects in the central, lobar, segmental or subsegmental pulmonary artery branches to suggest acute pulmonary embolism. Mildly atherosclerotic nonaneurysmal thoracic aorta. Normal caliber pulmonary arteries. Normal heart size. No significant pericardial fluid/thickening. Right internal jugular MediPort terminates in the right atrium. Mediastinum/Nodes: No discrete thyroid nodules. Unremarkable esophagus. No axillary adenopathy. Newly enlarged 2.0 cm right supraclavicular node (series 7/image 8). Increased right paratracheal adenopathy measuring up to 2.1 cm (series 7/ image 24), previously 1.2 cm. Increased prevascular bilateral mediastinal adenopathy measuring up to 2.0 cm on the right (series 7/ image 26), previously 1.3 cm. Increased 1.9 cm enlarged subcarinal node (series 7/ image 42), previously 1.1 cm. Newly enlarged 1.8 cm AP window node (series 7/ image 35). Newly moderate infiltrative left hilar adenopathy measuring up to 2.0 cm (series 7/ image 48), with associated extrinsic narrowing of segmental lingular and left lower lobe airways. No right hilar adenopathy. Lungs/Pleura: No pneumothorax. No right pleural effusion. New small dependent left pleural effusion. Mild centrilobular emphysema with mild diffuse bronchial wall thickening. Stable bandlike consolidation in the  anterior left upper lobe compatible with radiation fibrosis. Stable irregular solid 1.5 cm left upper lobe pulmonary nodule (series 13/image 40). New patchy consolidation, parenchymal banding and nodularity in the posterior left upper lobe and anterior left lower lobe. A few new scattered solid pulmonary nodules in the right lung, largest 6 mm in the anterior right middle lobe (series 13/ image 76). Musculoskeletal: No aggressive appearing focal osseous lesions. Moderate thoracic spondylosis. Review of the MIP images confirms the above findings. CT ABDOMEN and PELVIS FINDINGS Hepatobiliary: New hepatomegaly. Innumerable hypoenhancing masses replacing much of the liver, significantly increased in size and number, for example a 2.0 cm superior liver mass (series 4/ image 15), increased from 0.7 cm. Normal gallbladder with no radiopaque cholelithiasis. No biliary ductal dilatation. Pancreas: Normal, with no mass or duct dilation. Spleen: Normal size. No mass. Adrenals/Urinary Tract: Newly apparent 1.4 cm left adrenal nodule with density 64 HU. No right adrenal nodule. No hydronephrosis. Subcentimeter hypodense right renal cortical lesion is too small to characterize and is stable. No new renal lesions. Normal bladder. Stomach/Bowel: Normal non-distended stomach. Normal appendix. Mild sigmoid diverticulosis. There is a gas containing 6.0 x 4.9 x 7.2 cm multilocular abscess in the ventral left lower abdomen (series 4/image 66) with thick enhancing wall, which appears to demonstrate a fistulous connection to the proximal sigmoid colon (series 15/image 70), and which is intimately associated with multiple small bowel loops. No small bowel dilatation. No focal small bowel caliber transition. No definite small bowel wall thickening. Vascular/Lymphatic: Atherosclerotic nonaneurysmal abdominal aorta. Patent portal, splenic, hepatic and renal veins. No pathologically enlarged lymph nodes in the abdomen or pelvis. Reproductive:  Status post hysterectomy, with no abnormal findings at the vaginal cuff. No adnexal mass. Other: No ascites. Musculoskeletal: Previously visualized faintly sclerotic lesions throughout the lumbar vertebral bodies are not discretely visualized on today's scan. Mild lumbar spondylosis. Review of the MIP images confirms the above findings. IMPRESSION: 1. No evidence of pulmonary embolism. 2. Complex gas-containing multilocular abscess in the anterior left lower abdomen with fistulous connection to the  proximal sigmoid colon (suggesting the sequela of perforated diverticulitis), intimately associated with multiple small bowel loops. No evidence of bowel obstruction. 3. Significant progression of metastatic disease. Right supraclavicular, bilateral mediastinal and left hilar adenopathy is significantly increased. 4. New extrinsic narrowing of segmental left upper and left lower lobe airways by the infiltrative left hilar adenopathy. New patchy consolidation and nodularity in the posterior left upper lobe and anterior left lower lobe, favor a combination of postobstructive pneumonia and tumor. New scattered probable small pulmonary metastases in the right lung. 5. Liver metastases are significantly increased in size and number with new hepatomegaly. 6. New left adrenal metastasis. 7. New small dependent left pleural effusion. 8. Aortic Atherosclerosis (ICD10-I70.0) and Emphysema (ICD10-J43.9). Electronically Signed   By: Ilona Sorrel M.D.   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 History of palliative radiotherapy of an enlarging left upper lobe pulmonary nodule. Ongoing medical therapy. Patient presents with generalized abdominal pain and clinical concern for pulmonary embolism. EXAM: CT ANGIOGRAPHY CHEST CT ABDOMEN AND PELVIS WITH CONTRAST TECHNIQUE: Multidetector CT imaging  of the chest was performed using the standard protocol during bolus administration of intravenous contrast. Multiplanar CT image reconstructions and MIPs were obtained to evaluate the vascular anatomy. Multidetector CT imaging of the abdomen and pelvis was performed using the standard protocol during bolus administration of intravenous contrast. CONTRAST:  < 100 cc > ISOVUE-370 IOPAMIDOL (ISOVUE-370) INJECTION 76% COMPARISON:  03/09/2017 CT chest and abdomen. Chest radiograph from earlier today. 10/15/2016 CT abdomen/pelvis. FINDINGS: CTA CHEST FINDINGS Cardiovascular: The study is moderate quality for the evaluation of pulmonary embolism, with evaluation of the subsegmental vessels limited by motion. There are no filling defects in the central, lobar, segmental or subsegmental pulmonary artery branches to suggest acute pulmonary embolism. Mildly atherosclerotic nonaneurysmal thoracic aorta. Normal caliber pulmonary arteries. Normal heart size. No significant pericardial fluid/thickening. Right internal jugular MediPort terminates in the right atrium. Mediastinum/Nodes: No discrete thyroid nodules. Unremarkable esophagus. No axillary adenopathy. Newly enlarged 2.0 cm right supraclavicular node (series 7/image 8). Increased right paratracheal adenopathy measuring up to 2.1 cm (series 7/ image 24), previously 1.2 cm. Increased prevascular bilateral mediastinal adenopathy measuring up to 2.0 cm on the right (series 7/ image 26), previously 1.3 cm. Increased 1.9 cm enlarged subcarinal node (series 7/ image 42), previously 1.1 cm. Newly enlarged 1.8 cm AP window node (series 7/ image 35). Newly moderate infiltrative left hilar adenopathy measuring up to 2.0 cm (series 7/ image 48), with associated extrinsic narrowing of segmental lingular and left lower lobe airways. No right hilar adenopathy. Lungs/Pleura: No pneumothorax. No right pleural effusion. New small dependent left pleural effusion. Mild centrilobular emphysema  with mild diffuse bronchial wall thickening. Stable bandlike consolidation in the anterior left upper lobe compatible with radiation fibrosis. Stable irregular solid 1.5 cm left upper lobe pulmonary nodule (series 13/image 40). New patchy consolidation, parenchymal banding and nodularity in the posterior left upper lobe and anterior left lower lobe. A few new scattered solid pulmonary nodules in the right lung, largest 6 mm in the anterior right middle lobe (series 13/ image 76). Musculoskeletal: No aggressive appearing focal osseous lesions. Moderate thoracic spondylosis. Review of the MIP images confirms the above findings. CT ABDOMEN and PELVIS FINDINGS Hepatobiliary: New hepatomegaly. Innumerable hypoenhancing masses replacing much of the liver, significantly increased in size and number, for example a 2.0 cm superior liver  mass (series 4/ image 15), increased from 0.7 cm. Normal gallbladder with no radiopaque cholelithiasis. No biliary ductal dilatation. Pancreas: Normal, with no mass or duct dilation. Spleen: Normal size. No mass. Adrenals/Urinary Tract: Newly apparent 1.4 cm left adrenal nodule with density 64 HU. No right adrenal nodule. No hydronephrosis. Subcentimeter hypodense right renal cortical lesion is too small to characterize and is stable. No new renal lesions. Normal bladder. Stomach/Bowel: Normal non-distended stomach. Normal appendix. Mild sigmoid diverticulosis. There is a gas containing 6.0 x 4.9 x 7.2 cm multilocular abscess in the ventral left lower abdomen (series 4/image 66) with thick enhancing wall, which appears to demonstrate a fistulous connection to the proximal sigmoid colon (series 15/image 70), and which is intimately associated with multiple small bowel loops. No small bowel dilatation. No focal small bowel caliber transition. No definite small bowel wall thickening. Vascular/Lymphatic: Atherosclerotic nonaneurysmal abdominal aorta. Patent portal, splenic, hepatic and renal  veins. No pathologically enlarged lymph nodes in the abdomen or pelvis. Reproductive: Status post hysterectomy, with no abnormal findings at the vaginal cuff. No adnexal mass. Other: No ascites. Musculoskeletal: Previously visualized faintly sclerotic lesions throughout the lumbar vertebral bodies are not discretely visualized on today's scan. Mild lumbar spondylosis. Review of the MIP images confirms the above findings. IMPRESSION: 1. No evidence of pulmonary embolism. 2. Complex gas-containing multilocular abscess in the anterior left lower abdomen with fistulous connection to the proximal sigmoid colon (suggesting the sequela of perforated diverticulitis), intimately associated with multiple small bowel loops. No evidence of bowel obstruction. 3. Significant progression of metastatic disease. Right supraclavicular, bilateral mediastinal and left hilar adenopathy is significantly increased. 4. New extrinsic narrowing of segmental left upper and left lower lobe airways by the infiltrative left hilar adenopathy. New patchy consolidation and nodularity in the posterior left upper lobe and anterior left lower lobe, favor a combination of postobstructive pneumonia and tumor. New scattered probable small pulmonary metastases in the right lung. 5. Liver metastases are significantly increased in size and number with new hepatomegaly. 6. New left adrenal metastasis. 7. New small dependent left pleural effusion. 8. Aortic Atherosclerosis (ICD10-I70.0) and Emphysema (ICD10-J43.9). Electronically Signed   By: Ilona Sorrel M.D.   On: 04/24/2017 18:01   Ct Image Guided Drainage By Percutaneous Catheter  Result Date: 04/25/2017 INDICATION: History of metastatic small cell lung cancer with findings worrisome for advanced metastatic disease. Unfortunately, patient has developed a concomitant presumed diverticular abscess within the ventral aspect of the left lower abdomen. Given poor operative candidacy, request made for  CT-guided percutaneous drainage catheter placement for infection source control purposes. EXAM: CT IMAGE GUIDED DRAINAGE BY PERCUTANEOUS CATHETER COMPARISON:  CT abdomen and pelvis - 04/24/2017 MEDICATIONS: The patient is currently admitted to the hospital and receiving intravenous antibiotics. The antibiotics were administered within an appropriate time frame prior to the initiation of the procedure. ANESTHESIA/SEDATION: Moderate (conscious) sedation was employed during this procedure. A total of Versed 2 mg and Fentanyl 100 mcg was administered intravenously. Moderate Sedation Time: 13 minutes. The patient's level of consciousness and vital signs were monitored continuously by radiology nursing throughout the procedure under my direct supervision. CONTRAST:  None COMPLICATIONS: None immediate. PROCEDURE: Informed written consent was obtained from the patient after a discussion of the risks, benefits and alternatives to treatment. The patient was placed supine on the CT gantry and a pre procedural CT was performed re-demonstrating the known abscess/fluid collection within the ventral aspect of the left lower abdomen/ pelvis with dominant air in fluid containing component measuring  approximately 6.8 x 4.7 cm (image 18, series 2). The procedure was planned. A timeout was performed prior to the initiation of the procedure. The skin overlying the ventral aspect of the left lower abdomen was prepped and draped in the usual sterile fashion. The overlying soft tissues were anesthetized with 1% lidocaine with epinephrine. Appropriate trajectory was planned with the use of a 22 gauge spinal needle. An 18 gauge trocar needle was advanced into the abscess/fluid collection and a short Amplatz super stiff wire was coiled within the collection. Appropriate positioning was confirmed with a limited CT scan. The tract was serially dilated allowing placement of a 10 [place] all-purpose drainage catheter. Appropriate positioning was  confirmed with a limited postprocedural CT scan. Approximately 80 Ml of purulent fluid was aspirated. The tube was connected to a drainage bag and sutured in place. A dressing was placed. The patient tolerated the procedure well without immediate post procedural complication. IMPRESSION: Successful CT guided placement of a 10 [place] all purpose drain catheter into the presumed diverticular abscess within the ventral aspect the left lower abdomen with aspiration of 80 mL of purulent fluid. Samples were sent to the laboratory as requested by the ordering clinical team. Electronically Signed   By: Sandi Mariscal M.D.   On: 04/25/2017 14:55     CBC Recent Labs  Lab 04/24/17 1544 04/25/17 0459 04/26/17 0449 04/27/17 0443  WBC 19.8* 15.4* 14.2* 11.2*  HGB 10.2* 8.3* 8.1* 7.7*  HCT 29.2* 23.9* 23.3* 22.3*  PLT 176 145* 135* 125*  MCV 102.5* 103.9* 105.4* 105.2*  MCH 35.8* 36.1* 36.7* 36.3*  MCHC 34.9 34.7 34.8 34.5  RDW 17.6* 18.2* 18.4* 18.2*  LYMPHSABS 1.6  --  1.3 1.2  MONOABS 0.6  --  0.3 0.2  EOSABS 0.0  --  0.1 0.1  BASOSABS 0.0  --  0.0 0.0    Chemistries  Recent Labs  Lab 04/24/17 1544 04/25/17 0459 04/26/17 0449 04/27/17 0443  NA 132* 134* 135 134*  K 3.6 3.7 3.6 3.8  CL 93* 101 102 102  CO2 25 24 25 25   GLUCOSE 131* 89 78 72  BUN 17 16 14 11   CREATININE 0.91 0.89 0.97 0.89  CALCIUM 10.2 9.6 9.6 9.4  AST 290* 247* 258* 234*  ALT 46 37 32 31  ALKPHOS 559* 447* 446* 445*  BILITOT 4.7* 4.3* 3.4* 2.8*   ------------------------------------------------------------------------------------------------------------------ No results for input(s): CHOL, HDL, LDLCALC, TRIG, CHOLHDL, LDLDIRECT in the last 72 hours.  No results found for: HGBA1C ------------------------------------------------------------------------------------------------------------------ No results for input(s): TSH, T4TOTAL, T3FREE, THYROIDAB in the last 72 hours.  Invalid input(s):  FREET3 ------------------------------------------------------------------------------------------------------------------ No results for input(s): VITAMINB12, FOLATE, FERRITIN, TIBC, IRON, RETICCTPCT in the last 72 hours.  Coagulation profile Recent Labs  Lab 04/25/17 1027  INR 1.15    No results for input(s): DDIMER in the last 72 hours.  Cardiac Enzymes No results for input(s): CKMB, TROPONINI, MYOGLOBIN in the last 168 hours.  Invalid input(s): CK ------------------------------------------------------------------------------------------------------------------ No results found for: BNP   Roxan Hockey M.D on 04/27/2017 at 6:23 PM  Between 7am to 7pm - Pager - [phone]  After 7pm go to [email or web address] - password [email or web address]  [facility] -  Office  [phone]   Voice Recognition Viviann Spare dictation system was used to create this note, attempts have been made to correct errors. Please contact the author with questions and/or clarifications.

## 2017-04-27 NOTE — Progress Notes (Signed)
Palliative Medicine consult noted. Due to high referral volume, there may be a delay seeing this patient. Please call the Palliative Medicine Team office at [phone] if recommendations are needed in the interim.  Thank you for inviting [place] to see this patient.  Marjie Skiff Jalene Lacko, RN, BSN, [facility] 04/27/2017 8:37 AM Office [phone]

## 2017-04-27 NOTE — Progress Notes (Signed)
[facility]  Assessment & Plan: Diverticulitis with perforation and abscess formation  Perc drainage 12/31  IV abx  Soft diet Metastatic small cell lung Ca  Per Dr. [name]  Continue IV abx and drainage.  Follow up CT scan of pelvis to assess drain - timing per IR.  No operative intervention planned at present.  Will check her on Friday, 1/4.        [name], MD, [facility]       Office: [phone]    Chief Complaint: Diverticulitis with abscess formation, metastatic lung Ca.  Subjective: Patient in bed, nurse at bedside.  Voiding, small BM.  Pain much improved with drain in place.  Objective: Vital signs in last 24 hours: Temp:  [98.1 F (36.7 C)-99.7 F (37.6 C)] 98.1 F (36.7 C) (01/02 0455) Pulse Rate:  [100-105] 102 (01/02 0455) Resp:  [17-19] 18 (01/02 0455) BP: (110-111)/(60-68) 111/68 (01/02 0455) SpO2:  [92 %-98 %] 93 % (01/02 1026) Last BM Date: 04/24/17  Intake/Output from previous day: 01/01 0701 - 01/02 0700 In: 1510.8 [I.V.:1355.8; IV Piggyback:150] Out: 950 [Urine:900; Drains:50] Intake/Output this shift: Total I/O In: 240 [P.O.:240] Out: 300 [Urine:300]  Physical Exam: HEENT - sclerae clear, mucous membranes moist Neck - soft, voice hoarse and weak Abdomen - soft without distension; few BS present; drain LLQ with thin reddish brown output in bag; non-tender   Lab Results:  Recent Labs    04/26/17 0449 04/27/17 0443  WBC 14.2* 11.2*  HGB 8.1* 7.7*  HCT 23.3* 22.3*  PLT 135* 125*   BMET Recent Labs    04/26/17 0449 04/27/17 0443  NA 135 134*  K 3.6 3.8  CL 102 102  CO2 25 25  GLUCOSE 78 72  BUN 14 11  CREATININE 0.97 0.89  CALCIUM 9.6 9.4   PT/INR Recent Labs    04/25/17 1027  LABPROT 14.6  INR 1.15   Comprehensive Metabolic Panel:    Component Value Date/Time   NA 134 (L) 04/27/2017 0443   NA 135 04/26/2017 0449   NA 133 (L) 04/13/2017  1058   NA 135 (L) 03/23/2017 1048   K 3.8 04/27/2017 0443   K 3.6 04/26/2017 0449   K 3.6 04/13/2017 1058   K 3.7 03/23/2017 1048   CL 102 04/27/2017 0443   CL 102 04/26/2017 0449   CL 104 07/18/2012 1451   CO2 25 04/27/2017 0443   CO2 25 04/26/2017 0449   CO2 24 04/13/2017 1058   CO2 26 03/23/2017 1048   BUN 11 04/27/2017 0443   BUN 14 04/26/2017 0449   BUN 9.4 04/13/2017 1058   BUN 14.0 03/23/2017 1048   CREATININE 0.89 04/27/2017 0443   CREATININE 0.97 04/26/2017 0449   CREATININE 1.0 04/13/2017 1058   CREATININE 1.0 03/23/2017 1048   GLUCOSE 72 04/27/2017 0443   GLUCOSE 78 04/26/2017 0449   GLUCOSE 101 04/13/2017 1058   GLUCOSE 142 (H) 03/23/2017 1048   GLUCOSE 99 07/18/2012 1451   CALCIUM 9.4 04/27/2017 0443   CALCIUM 9.6 04/26/2017 0449   CALCIUM 10.0 04/13/2017 1058   CALCIUM 9.8 03/23/2017 1048   AST 234 (H) 04/27/2017 0443   AST 258 (H) 04/26/2017 0449   AST 127 (H) 04/13/2017 1058   AST 89 (H) 03/23/2017 1048   ALT 31 04/27/2017 0443   ALT 32 04/26/2017 0449   ALT 49 04/13/2017 1058   ALT 26 03/23/2017  1048   ALKPHOS 445 (H) 04/27/2017 0443   ALKPHOS 446 (H) 04/26/2017 0449   ALKPHOS 483 (H) 04/13/2017 1058   ALKPHOS 314 (H) 03/23/2017 1048   BILITOT 2.8 (H) 04/27/2017 0443   BILITOT 3.4 (H) 04/26/2017 0449   BILITOT 0.97 04/13/2017 1058   BILITOT 0.60 03/23/2017 1048   PROT 5.4 (L) 04/27/2017 0443   PROT 5.6 (L) 04/26/2017 0449   PROT 7.2 04/13/2017 1058   PROT 7.1 03/23/2017 1048   ALBUMIN 2.0 (L) 04/27/2017 0443   ALBUMIN 2.0 (L) 04/26/2017 0449   ALBUMIN 2.9 (L) 04/13/2017 1058   ALBUMIN 2.5 (L) 03/23/2017 1048    Studies/Results: Ct Image Guided Drainage By Percutaneous Catheter  Result Date: 04/25/2017 INDICATION: History of metastatic small cell lung cancer with findings worrisome for advanced metastatic disease. Unfortunately, patient has developed a concomitant presumed diverticular abscess within the ventral aspect of the left lower  abdomen. Given poor operative candidacy, request made for CT-guided percutaneous drainage catheter placement for infection source control purposes. EXAM: CT IMAGE GUIDED DRAINAGE BY PERCUTANEOUS CATHETER COMPARISON:  CT abdomen and pelvis - 04/24/2017 MEDICATIONS: The patient is currently admitted to the hospital and receiving intravenous antibiotics. The antibiotics were administered within an appropriate time frame prior to the initiation of the procedure. ANESTHESIA/SEDATION: Moderate (conscious) sedation was employed during this procedure. A total of Versed 2 mg and Fentanyl 100 mcg was administered intravenously. Moderate Sedation Time: 13 minutes. The patient's level of consciousness and vital signs were monitored continuously by radiology nursing throughout the procedure under my direct supervision. CONTRAST:  None COMPLICATIONS: None immediate. PROCEDURE: Informed written consent was obtained from the patient after a discussion of the risks, benefits and alternatives to treatment. The patient was placed supine on the CT gantry and a pre procedural CT was performed re-demonstrating the known abscess/fluid collection within the ventral aspect of the left lower abdomen/ pelvis with dominant air in fluid containing component measuring approximately 6.8 x 4.7 cm (image 18, series 2). The procedure was planned. A timeout was performed prior to the initiation of the procedure. The skin overlying the ventral aspect of the left lower abdomen was prepped and draped in the usual sterile fashion. The overlying soft tissues were anesthetized with 1% lidocaine with epinephrine. Appropriate trajectory was planned with the use of a 22 gauge spinal needle. An 18 gauge trocar needle was advanced into the abscess/fluid collection and a short Amplatz super stiff wire was coiled within the collection. Appropriate positioning was confirmed with a limited CT scan. The tract was serially dilated allowing placement of a 10 [place]  all-purpose drainage catheter. Appropriate positioning was confirmed with a limited postprocedural CT scan. Approximately 80 Ml of purulent fluid was aspirated. The tube was connected to a drainage bag and sutured in place. A dressing was placed. The patient tolerated the procedure well without immediate post procedural complication. IMPRESSION: Successful CT guided placement of a 10 French all purpose drain catheter into the presumed diverticular abscess within the ventral aspect the left lower abdomen with aspiration of 80 mL of purulent fluid. Samples were sent to the laboratory as requested by the ordering clinical team. Electronically Signed   By: Sandi Mariscal M.D.   On: 04/25/2017 14:55      Aimar Borghi M 04/27/2017  Patient ID: Sharon [name], female   DOB: 05-18-1955, 62 y.o.   MRN: [id number]

## 2017-04-27 NOTE — Consult Note (Signed)
Consultation Note Date: 04/27/2017   Patient Name: Sharon [name]  DOB: 1955/11/08  MRN: [id number]  Age / Sex: 62 y.o., female  PCP: [name], PA Referring Physician: Roxan Hockey, MD  Reason for Consultation: Establishing goals of care and Hospice Evaluation  HPI/Patient Profile: 62 y.o. female  with past medical history of COPD (followed with Dr. Ashok Cordia), h/o cancer of right breast, HTN, extensive and progressing SCLC (follows with Dr. Julien Nordmann) admitted from home on 04/24/2017 with abd pain and found to diverticulitis with abscess formation treated with drain placement by IR for abscess and antibiotics. Possibly a fistula that could require long term drain - IR following for recommendations. Palliative care requested to continue [place] discussions initiated by Dr. Julien Nordmann.   Clinical Assessment and Goals of Care: I met today with Sharon [name]. I explained palliative care services to her as a focus on QOL, symptom management, and support with serious illness and difficult decisions. We discussed the unfortunate lack of palliative care resources as an outpatient and she is discouraged by this. She is upset that "a person has to die to get any help" - referring to hospice services. She has had good experiences with hospice (her mother died of cancer and had hospice) but she is just not yet prepared for hospice. I will reach out to outpt palliative programs to see what services they can offer her.   She feels strongly that she is hopeful to try immunotherapy in the future. However, she is also able to rationalize that the risks may not outweigh the benefits of pursuing more treatment. Her motivation is her children (1 daughter, 2 sons and she lives with 1 of her sons) and this is what gives her good quality if spending time with them. She says she has a friend planning to take her to the beach - she has plans to "just  keep living my life." She is very open that she knows she will not survive this disease but wants to feel as if she has tried everything.   She also has this hoarseness that has been present ~2 years now she believes. This is difficult as it makes it difficult for her to coordinate her care as people struggle to understand her over the telephone. She would like to f/u with pulmonology to have this worked up further (she did follow with Dr. Ashok Cordia previously). I will also see how SLP could assist with this. Of note we also discussed anticipated symptoms she may have associated with her progressing cancer. We discussed the role of opioids in pain but also in shortness of breath. Discussed how hospice can be a key player in managing these symptoms when she is ready for hospice.   Sharon [name] has an associate's, bachelor's, and master's degree and enjoys working with people. 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   Primary Decision Maker PATIENT    SUMMARY OF RECOMMENDATIONS   -  Desires PCCM follow up for COPD and hoarseness - Checking into outpt palliative resources - Will continue to follow and discuss - Discussed hospice grief/bereavement counseling for her children  Code Status/Advance Care Planning:  Full code - did not discuss today   Symptom Management:   Chronic pain chest tightness/ache and SOB: Morphine while in hospital. May utilize her Percocet as prescribed at home. Understands that with worsening disease she may need more assistance with symptom management.   Palliative Prophylaxis:   Delirium Protocol and Frequent Pain Assessment  Additional Recommendations (Limitations, Scope, Preferences):  Full Scope Treatment  Psycho-social/Spiritual:   Desire for further Chaplaincy support:no  Additional Recommendations: Caregiving  Support/Resources, Education on Hospice and  Grief/Bereavement Support  Prognosis:   < 6 months  Discharge Planning: To Be Determined      Primary Diagnoses: Present on Admission: . Diverticulitis of large intestine with perforation and abscess . Primary small cell carcinoma of left lung ([place]) . Transaminitis . COPD, severe ([place])   I have reviewed the medical record, interviewed the patient and family, and examined the patient. The following aspects are pertinent.  Past Medical History:  Diagnosis Date  . Cancer of right breast ([place]) 2009  . CAP (community acquired pneumonia) 11/06/2015  . Chemotherapy induced neutropenia ([place]) 12/17/2016  . COPD (chronic obstructive pulmonary disease) ([place])   . Elevated blood pressure   . Encounter for antineoplastic chemotherapy 01/05/2016  . History of radiation therapy 05/04/16-05/18/16   whole brain 25 Gy in 10 fractions  . History of radiation therapy 09/01/16 - 09/21/16   Left Lung treated to 35 Gy in 14 fractions  . Hoarseness of voice    "for the last 2 months" (11/06/2015)  . Hypertension   . Hypokalemia 12/17/2016  . Lung mass dx'd 10/2015  . Menopause   . Personal history of breast cancer 03/29/2016  . Pre-diabetes   . Small cell lung cancer ([place])   . Vitamin D deficiency    Social History   Socioeconomic History  . Marital status: Divorced    Spouse name: None  . Number of children: 3  . Years of education: None  . Highest education level: None  Social Needs  . Financial resource strain: None  . Food insecurity - worry: None  . Food insecurity - inability: None  . Transportation needs - medical: None  . Transportation needs - non-medical: None  Occupational History  . Occupation: Lawncare   Tobacco Use  . Smoking status: Former Smoker    Packs/day: 0.10    Years: 46.00    Pack years: 4.60    Types: Cigarettes    Last attempt to quit: 11/11/2015    Years since quitting: 1.4  . Smokeless tobacco: Never Used  . Tobacco comment: Peak rate of 2ppd  Substance and  Sexual Activity  . Alcohol use: No  . Drug use: Yes    Types: Marijuana    Comment: 11/06/2015 "1-2 times/week when I do smoke; none lately"  . Sexual activity: Yes  Other Topics Concern  . None  Social History Narrative  . None   Family History  Adopted: Yes  Problem Relation Age of Onset  . Cancer Neg Hx    Scheduled Meds: . ferrous sulfate  325 mg Oral Daily  . fluticasone  1 spray Each Nare Daily  . fluticasone furoate-vilanterol  1 puff Inhalation Daily  . ipratropium-albuterol  3 mL Nebulization BID  . magnesium oxide  400 mg Oral QID  . montelukast  10 mg Oral QHS  . potassium chloride  20 mEq Oral BID  . senna  1 tablet Oral Daily  . sodium chloride flush  5 mL Intravenous Q8H   Continuous Infusions: . sodium chloride 50 mL/hr at 04/26/17 1901  . piperacillin-tazobactam (ZOSYN)  IV 3.375 g (04/27/17 0755)  . vancomycin Stopped (04/26/17 1950)   PRN Meds:.acetaminophen **OR** acetaminophen, albuterol, meclizine, morphine injection, ondansetron **OR** ondansetron (ZOFRAN) IV, sodium chloride flush No Known Allergies Review of Systems  Constitutional: Positive for activity change, appetite change and fatigue.  HENT: Positive for voice change.   Respiratory: Positive for cough, chest tightness and shortness of breath.   Gastrointestinal: Positive for abdominal pain.  Neurological: Positive for weakness.    Physical Exam  Constitutional: She is oriented to person, place, and time. She appears well-developed. She has a sickly appearance.  HENT:  Head: Normocephalic and atraumatic.  Cardiovascular: Regular rhythm. Tachycardia present.  Pulmonary/Chest: No accessory muscle usage. No tachypnea. No respiratory distress.  Abdominal:  Drain in place  Neurological: She is alert and oriented to person, place, and time.  Nursing note and vitals reviewed.   Vital Signs: BP 111/68 (BP Location: Right Arm)   Pulse (!) 102   Temp 98.1 F (36.7 C) (Oral)   Resp 18   Ht  '5\' 7"'  (1.702 m)   Wt 82.2 kg (181 lb 3.5 oz)   SpO2 93%   BMI 28.38 kg/m  Pain Assessment: 0-10   Pain Score: Asleep   SpO2: SpO2: 93 % O2 Device:SpO2: 93 % O2 Flow Rate: .O2 Flow Rate (L/min): 2 L/min  IO: Intake/output summary:   Intake/Output Summary (Last 24 hours) at 04/27/2017 1031 Last data filed at 04/27/2017 1015 Gross per 24 hour  Intake 1334.17 ml  Output 950 ml  Net 384.17 ml    LBM: Last BM Date: 04/24/17 Baseline Weight: Weight: 82.2 kg (181 lb 3.5 oz) Most recent weight: Weight: 82.2 kg (181 lb 3.5 oz)     Palliative Assessment/Data: 50%     Time Total: 80 min  Greater than 50%  of this time was spent counseling and coordinating care related to the above assessment and plan.  Signed by: Vinie Sill, NP Palliative Medicine Team Pager # [phone] (M-F 8a-5p) Team Phone # [phone] (Nights/Weekends)

## 2017-04-27 NOTE — Progress Notes (Signed)
Pharmacy Antibiotic Note  Sharon [name] is a 62 y.o. female with metastatic lung cancer presented to the ED on 04/24/2017 with c/o abd pain. Abd CTA on 12/30 showed multilocular abscess in the anterior left lower abdomen and new patchy consolidation and nodularity in the posterior left upper lobe and anterior left lower lobe with suspicion for PNA/tumor.  Broad spectrum abx started on admission for PNA and abscess.  - 12/31: IR placed diverticular abscess drain   Today, 04/27/2017: - day #3 vancomycin and zosyn - afeb, wbc down 11.2 - scr 0.89 (crcl~ 74) - abscess culture is positive for strep viridan  Plan: - continue with vancomycin  1000 mg Iv q24h and zosyn 3.375 gm IV q8h (infuse over 4 hrs) for now - pharmacy will plan to check vancomycin levels soon if to continue with abx - monitor renal function and f/u with cultures ___________________________________  Height: 5\' 7"  (170.2 cm) Weight: 181 lb 3.5 oz (82.2 kg) IBW/kg (Calculated) : 61.6  Temp (24hrs), Avg:98.5 F (36.9 C), Min:98.1 F (36.7 C), Max:98.9 F (37.2 C)  Recent Labs  Lab 04/24/17 1544 04/24/17 1754 04/25/17 0459 04/26/17 0449 04/27/17 0443  WBC 19.8*  --  15.4* 14.2* 11.2*  CREATININE 0.91  --  0.89 0.97 0.89  LATICACIDVEN  --  1.27  --   --   --     Estimated Creatinine Clearance: 74.1 mL/min (by C-G formula based on SCr of 0.89 mg/dL).    No Known Allergies  Antimicrobials this admission:  12/30 vanc >>   12/30 zosyn >>    Dose adjustments this admission:  --  Microbiology results:  12/30 BCx x2: ngtd 12/30 UCx: NGF 12/31  MRSA PCR: neg 12/31 HIV neg 12/31 abscess: moderate GVR; strep viridan (S= erythro, LVQ, Vanc)   Thank you for allowing pharmacy to be a part of this patient's care.  Lynelle Doctor 04/27/2017 1:12 PM

## 2017-04-28 LAB — CREATININE, SERUM
Creatinine, Ser: 0.88 mg/dL (ref 0.44–1.00)
GFR calc Af Amer: >60 mL/min (ref >60)
GFR calc non Af Amer: >60 mL/min (ref >60)

## 2017-04-28 NOTE — Progress Notes (Signed)
Referring Physician(Sharon [name]): Sharon [name],Sharon [name]  Supervising Physician: Sharon [name]  Patient Status:  [name] - In-pt  Chief Complaint: Pelvic abscess   Subjective:  Pt doing ok; currently denies N/V; asking about converting to solid diet; less abd pain since drain placed  Allergies: Patient has no known allergies.  Medications: Prior to Admission medications   Medication Sig Start Date End Date Taking? Authorizing Provider  albuterol (PROVENTIL HFA;VENTOLIN HFA) 108 (90 Base) MCG/ACT inhaler Inhale 1 puff into the lungs every 6 (six) hours as needed for wheezing or shortness of breath.   Yes [provider]  budesonide-formoterol (SYMBICORT) 160-4.5 MCG/ACT inhaler Inhale 2 puffs into the lungs 2 (two) times daily. 08/17/16  Yes Sharon Glazier, MD  ferrous sulfate 325 (65 FE) MG EC tablet Take 325 mg by mouth daily.   Yes [provider]  fluticasone (FLONASE) 50 MCG/ACT nasal spray Place 1 spray into both nostrils daily.  08/20/16  Yes [provider]  Ipratropium-Albuterol (COMBIVENT RESPIMAT) 20-100 MCG/ACT AERS respimat Inhale 1 puff into the lungs every 6 (six) hours.   Yes [provider]  loperamide (IMODIUM) 2 MG capsule Take by mouth as needed for diarrhea or loose stools.   Yes [provider]  LORazepam (ATIVAN) 0.5 MG tablet 1 tablet po 30 minutes prior to radiation or MRI 09/28/16  Yes Sharon Pedro, PA-C  magnesium oxide (MAG-OX) 400 (241.3 Mg) MG tablet Take 1 tablet (400 mg total) by mouth 4 (four) times daily. 01/26/17  Yes Sharon [name], Sharon Awkward, NP  meclizine (ANTIVERT) 12.5 MG tablet Take 1-2 tablets (12.5-25 mg total) by mouth 3 (three) times daily as needed for dizziness. 06/05/16  Yes Sharon Manifold, MD  methylPREDNISolone (MEDROL) 4 MG tablet Take 6 tabs on day 1, 5 tabs on day 2, 4 tabs on day 3, 3 tabs on day 4, 2 tabs on day 5, 1 tab on day 6, and then stop 04/13/17  Yes Sharon [name], Sharon Awkward, NP  montelukast (SINGULAIR) 10  MG tablet Take 10 mg by mouth at bedtime.  09/06/16  Yes [provider]  ondansetron (ZOFRAN) 8 MG tablet Take 1 tablet (8 mg total) every 8 (eight) hours as needed by mouth for nausea or vomiting. 03/11/17  Yes Sharon [name], Sharon Awkward, NP  polyethylene glycol powder (GLYCOLAX/MIRALAX) powder Take 1 Container by mouth daily.   Yes [provider]  potassium chloride 20 MEQ/15ML (10%) SOLN Take 15 mLs (20 mEq total) by mouth 2 (two) times daily. 02/25/17  Yes Sharon [name], Sharon Awkward, NP  prochlorperazine (COMPAZINE) 10 MG tablet TAKE 1 TABLET BY MOUTH EVERY 6 HOURS AS NEEDED FOR NAUSEA AND VOMITING 02/11/17  Yes Sharon [name], Sharon Awkward, NP  SANTYL ointment APP TOPICALLY UTD 12/08/16  Yes [provider]  senna (SENOKOT) 8.6 MG TABS tablet Take 1 tablet (8.6 mg total) by mouth daily. 10/17/16  Yes Sharon Pour, MD  sucralfate (CARAFATE) 1 GM/10ML suspension Take 10 mLs (1 g total) by mouth 4 (four) times daily -  with meals and at bedtime. 01/19/17  Yes Sharon Pray, MD  temozolomide (TEMODAR) 140 MG capsule Take 2 capsules (280 mg total) daily by mouth. Take for days 1-5 every 28 days. May take on empty stomach & at bedtime to decrease N&V. 03/15/17  Yes Sharon Bears, MD  UNABLE TO FIND Take 10 mLs by mouth daily as needed. Med Name: CBD Oil   Yes [provider]     Vital Signs: BP (!) 102/55 (BP Location:  Right Wrist)   Pulse 88   Temp 98.4 F (36.9 C) (Oral)   Resp 18   Ht 5\' 7"  (1.702 m)   Wt 181 lb 3.5 oz (82.2 kg)   SpO2 97%   BMI 28.38 kg/m   Physical Exam LLQ drain intact, output 55 cc blood-tinged fluid yesterday, no record listed today; insertion site ok, mildly tender; cx- e coli, strept viridans  Imaging: Dg Chest 2 View  Result Date: 04/24/2017 CLINICAL DATA:  Shortness of breath. All on immunotherapy for lung cancer. Ex-smoker. EXAM: CHEST  2 VIEW COMPARISON:  CT 03/09/2017.  Plain film of 01/09/2017. FINDINGS: Lateral view degraded by patient arm  position. Right Port-A-Cath terminates at the low SVC. Midline trachea. Normal heart size. New small left pleural effusion. Probable treatment effects in the left upper lobe laterally. Clear right lung. Left base subsegmental atelectasis. IMPRESSION: New small left pleural effusion with adjacent left base subsegmental atelectasis. Electronically Signed   By: Sharon [name] M.D.   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 History of palliative radiotherapy of an enlarging left upper lobe pulmonary nodule. Ongoing medical therapy. Patient presents with generalized abdominal pain and clinical concern for pulmonary embolism. EXAM: CT ANGIOGRAPHY CHEST CT ABDOMEN AND PELVIS WITH CONTRAST TECHNIQUE: Multidetector CT imaging of the chest was performed using the standard protocol during bolus administration of intravenous contrast. Multiplanar CT image reconstructions and MIPs were obtained to evaluate the vascular anatomy. Multidetector CT imaging of the abdomen and pelvis was performed using the standard protocol during bolus administration of intravenous contrast. CONTRAST:  < 100 cc > ISOVUE-370 IOPAMIDOL (ISOVUE-370) INJECTION 76% COMPARISON:  03/09/2017 CT chest and abdomen. Chest radiograph from earlier today. 10/15/2016 CT abdomen/pelvis. FINDINGS: CTA CHEST FINDINGS Cardiovascular: The study is moderate quality for the evaluation of pulmonary embolism, with evaluation of the subsegmental vessels limited by motion. There are no filling defects in the central, lobar, segmental or subsegmental pulmonary artery branches to suggest acute pulmonary embolism. Mildly atherosclerotic nonaneurysmal thoracic aorta. Normal caliber pulmonary arteries. Normal heart size. No significant pericardial fluid/thickening. Right internal jugular MediPort terminates in the right atrium.  Mediastinum/Nodes: No discrete thyroid nodules. Unremarkable esophagus. No axillary adenopathy. Newly enlarged 2.0 cm right supraclavicular node (series 7/image 8). Increased right paratracheal adenopathy measuring up to 2.1 cm (series 7/ image 24), previously 1.2 cm. Increased prevascular bilateral mediastinal adenopathy measuring up to 2.0 cm on the right (series 7/ image 26), previously 1.3 cm. Increased 1.9 cm enlarged subcarinal node (series 7/ image 42), previously 1.1 cm. Newly enlarged 1.8 cm AP window node (series 7/ image 35). Newly moderate infiltrative left hilar adenopathy measuring up to 2.0 cm (series 7/ image 48), with associated extrinsic narrowing of segmental lingular and left lower lobe airways. No right hilar adenopathy. Lungs/Pleura: No pneumothorax. No right pleural effusion. New small dependent left pleural effusion. Mild centrilobular emphysema with mild diffuse bronchial wall thickening. Stable bandlike consolidation in the anterior left upper lobe compatible with radiation fibrosis. Stable irregular solid 1.5 cm left upper lobe pulmonary nodule (series 13/image 40). New patchy consolidation, parenchymal banding and nodularity in the posterior left upper lobe and anterior left lower lobe. A few new scattered solid pulmonary nodules in the right lung, largest 6 mm in the anterior right middle lobe (series 13/ image 76). Musculoskeletal: No aggressive appearing focal osseous lesions. Moderate  thoracic spondylosis. Review of the MIP images confirms the above findings. CT ABDOMEN and PELVIS FINDINGS Hepatobiliary: New hepatomegaly. Innumerable hypoenhancing masses replacing much of the liver, significantly increased in size and number, for example a 2.0 cm superior liver mass (series 4/ image 15), increased from 0.7 cm. Normal gallbladder with no radiopaque cholelithiasis. No biliary ductal dilatation. Pancreas: Normal, with no mass or duct dilation. Spleen: Normal size. No mass.  Adrenals/Urinary Tract: Newly apparent 1.4 cm left adrenal nodule with density 64 HU. No right adrenal nodule. No hydronephrosis. Subcentimeter hypodense right renal cortical lesion is too small to characterize and is stable. No new renal lesions. Normal bladder. Stomach/Bowel: Normal non-distended stomach. Normal appendix. Mild sigmoid diverticulosis. There is a gas containing 6.0 x 4.9 x 7.2 cm multilocular abscess in the ventral left lower abdomen (series 4/image 66) with thick enhancing wall, which appears to demonstrate a fistulous connection to the proximal sigmoid colon (series 15/image 70), and which is intimately associated with multiple small bowel loops. No small bowel dilatation. No focal small bowel caliber transition. No definite small bowel wall thickening. Vascular/Lymphatic: Atherosclerotic nonaneurysmal abdominal aorta. Patent portal, splenic, hepatic and renal veins. No pathologically enlarged lymph nodes in the abdomen or pelvis. Reproductive: Status post hysterectomy, with no abnormal findings at the vaginal cuff. No adnexal mass. Other: No ascites. Musculoskeletal: Previously visualized faintly sclerotic lesions throughout the lumbar vertebral bodies are not discretely visualized on today'Sharon [name] scan. Mild lumbar spondylosis. Review of the MIP images confirms the above findings. IMPRESSION: 1. No evidence of pulmonary embolism. 2. Complex gas-containing multilocular abscess in the anterior left lower abdomen with fistulous connection to the proximal sigmoid colon (suggesting the sequela of perforated diverticulitis), intimately associated with multiple small bowel loops. No evidence of bowel obstruction. 3. Significant progression of metastatic disease. Right supraclavicular, bilateral mediastinal and left hilar adenopathy is significantly increased. 4. New extrinsic narrowing of segmental left upper and left lower lobe airways by the infiltrative left hilar adenopathy. New patchy consolidation and  nodularity in the posterior left upper lobe and anterior left lower lobe, favor a combination of postobstructive pneumonia and tumor. New scattered probable small pulmonary metastases in the right lung. 5. Liver metastases are significantly increased in size and number with new hepatomegaly. 6. New left adrenal metastasis. 7. New small dependent left pleural effusion. 8. Aortic Atherosclerosis (ICD10-I70.0) and Emphysema (ICD10-J43.9). Electronically Signed   By: Ilona Sorrel M.D.   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 History of palliative radiotherapy of an enlarging left upper lobe pulmonary nodule. Ongoing medical therapy. Patient presents with generalized abdominal pain and clinical concern for pulmonary embolism. EXAM: CT ANGIOGRAPHY CHEST CT ABDOMEN AND PELVIS WITH CONTRAST TECHNIQUE: Multidetector CT imaging of the chest was performed using the standard protocol during bolus administration of intravenous contrast. Multiplanar CT image reconstructions and MIPs were obtained to evaluate the vascular anatomy. Multidetector CT imaging of the abdomen and pelvis was performed using the standard protocol during bolus administration of intravenous contrast. CONTRAST:  < 100 cc > ISOVUE-370 IOPAMIDOL (ISOVUE-370) INJECTION 76% COMPARISON:  03/09/2017 CT chest and abdomen. Chest radiograph from earlier today. 10/15/2016 CT abdomen/pelvis. FINDINGS: CTA CHEST FINDINGS Cardiovascular: The study is moderate quality for the evaluation of pulmonary embolism, with evaluation of the subsegmental vessels limited by motion. There are no filling defects in the central, lobar, segmental or subsegmental pulmonary artery  branches to suggest acute pulmonary embolism. Mildly atherosclerotic nonaneurysmal thoracic aorta. Normal caliber pulmonary arteries. Normal heart size. No  significant pericardial fluid/thickening. Right internal jugular MediPort terminates in the right atrium. Mediastinum/Nodes: No discrete thyroid nodules. Unremarkable esophagus. No axillary adenopathy. Newly enlarged 2.0 cm right supraclavicular node (series 7/image 8). Increased right paratracheal adenopathy measuring up to 2.1 cm (series 7/ image 24), previously 1.2 cm. Increased prevascular bilateral mediastinal adenopathy measuring up to 2.0 cm on the right (series 7/ image 26), previously 1.3 cm. Increased 1.9 cm enlarged subcarinal node (series 7/ image 42), previously 1.1 cm. Newly enlarged 1.8 cm AP window node (series 7/ image 35). Newly moderate infiltrative left hilar adenopathy measuring up to 2.0 cm (series 7/ image 48), with associated extrinsic narrowing of segmental lingular and left lower lobe airways. No right hilar adenopathy. Lungs/Pleura: No pneumothorax. No right pleural effusion. New small dependent left pleural effusion. Mild centrilobular emphysema with mild diffuse bronchial wall thickening. Stable bandlike consolidation in the anterior left upper lobe compatible with radiation fibrosis. Stable irregular solid 1.5 cm left upper lobe pulmonary nodule (series 13/image 40). New patchy consolidation, parenchymal banding and nodularity in the posterior left upper lobe and anterior left lower lobe. A few new scattered solid pulmonary nodules in the right lung, largest 6 mm in the anterior right middle lobe (series 13/ image 76). Musculoskeletal: No aggressive appearing focal osseous lesions. Moderate thoracic spondylosis. Review of the MIP images confirms the above findings. CT ABDOMEN and PELVIS FINDINGS Hepatobiliary: New hepatomegaly. Innumerable hypoenhancing masses replacing much of the liver, significantly increased in size and number, for example a 2.0 cm superior liver mass (series 4/ image 15), increased from 0.7 cm. Normal gallbladder with no radiopaque cholelithiasis. No biliary ductal  dilatation. Pancreas: Normal, with no mass or duct dilation. Spleen: Normal size. No mass. Adrenals/Urinary Tract: Newly apparent 1.4 cm left adrenal nodule with density 64 HU. No right adrenal nodule. No hydronephrosis. Subcentimeter hypodense right renal cortical lesion is too small to characterize and is stable. No new renal lesions. Normal bladder. Stomach/Bowel: Normal non-distended stomach. Normal appendix. Mild sigmoid diverticulosis. There is a gas containing 6.0 x 4.9 x 7.2 cm multilocular abscess in the ventral left lower abdomen (series 4/image 66) with thick enhancing wall, which appears to demonstrate a fistulous connection to the proximal sigmoid colon (series 15/image 70), and which is intimately associated with multiple small bowel loops. No small bowel dilatation. No focal small bowel caliber transition. No definite small bowel wall thickening. Vascular/Lymphatic: Atherosclerotic nonaneurysmal abdominal aorta. Patent portal, splenic, hepatic and renal veins. No pathologically enlarged lymph nodes in the abdomen or pelvis. Reproductive: Status post hysterectomy, with no abnormal findings at the vaginal cuff. No adnexal mass. Other: No ascites. Musculoskeletal: Previously visualized faintly sclerotic lesions throughout the lumbar vertebral bodies are not discretely visualized on today'Sharon [name] scan. Mild lumbar spondylosis. Review of the MIP images confirms the above findings. IMPRESSION: 1. No evidence of pulmonary embolism. 2. Complex gas-containing multilocular abscess in the anterior left lower abdomen with fistulous connection to the proximal sigmoid colon (suggesting the sequela of perforated diverticulitis), intimately associated with multiple small bowel loops. No evidence of bowel obstruction. 3. Significant progression of metastatic disease. Right supraclavicular, bilateral mediastinal and left hilar adenopathy is significantly increased. 4. New extrinsic narrowing of segmental left upper and left  lower lobe airways by the infiltrative left hilar adenopathy. New patchy consolidation and nodularity in the posterior left upper lobe and anterior left lower lobe, favor a combination of  postobstructive pneumonia and tumor. New scattered probable small pulmonary metastases in the right lung. 5. Liver metastases are significantly increased in size and number with new hepatomegaly. 6. New left adrenal metastasis. 7. New small dependent left pleural effusion. 8. Aortic Atherosclerosis (ICD10-I70.0) and Emphysema (ICD10-J43.9). Electronically Signed   By: Ilona Sorrel M.D.   On: 04/24/2017 18:01   Ct Image Guided Drainage By Percutaneous Catheter  Result Date: 04/25/2017 INDICATION: History of metastatic small cell lung cancer with findings worrisome for advanced metastatic disease. Unfortunately, patient has developed a concomitant presumed diverticular abscess within the ventral aspect of the left lower abdomen. Given poor operative candidacy, request made for CT-guided percutaneous drainage catheter placement for infection source control purposes. EXAM: CT IMAGE GUIDED DRAINAGE BY PERCUTANEOUS CATHETER COMPARISON:  CT abdomen and pelvis - 04/24/2017 MEDICATIONS: The patient is currently admitted to the hospital and receiving intravenous antibiotics. The antibiotics were administered within an appropriate time frame prior to the initiation of the procedure. ANESTHESIA/SEDATION: Moderate (conscious) sedation was employed during this procedure. A total of Versed 2 mg and Fentanyl 100 mcg was administered intravenously. Moderate Sedation Time: 13 minutes. The patient'Sharon [name] level of consciousness and vital signs were monitored continuously by radiology nursing throughout the procedure under my direct supervision. CONTRAST:  None COMPLICATIONS: None immediate. PROCEDURE: Informed written consent was obtained from the patient after a discussion of the risks, benefits and alternatives to treatment. The patient was placed  supine on the CT gantry and a pre procedural CT was performed re-demonstrating the known abscess/fluid collection within the ventral aspect of the left lower abdomen/ pelvis with dominant air in fluid containing component measuring approximately 6.8 x 4.7 cm (image 18, series 2). The procedure was planned. A timeout was performed prior to the initiation of the procedure. The skin overlying the ventral aspect of the left lower abdomen was prepped and draped in the usual sterile fashion. The overlying soft tissues were anesthetized with 1% lidocaine with epinephrine. Appropriate trajectory was planned with the use of a 22 gauge spinal needle. An 18 gauge trocar needle was advanced into the abscess/fluid collection and a short Amplatz super stiff wire was coiled within the collection. Appropriate positioning was confirmed with a limited CT scan. The tract was serially dilated allowing placement of a 10 [place] all-purpose drainage catheter. Appropriate positioning was confirmed with a limited postprocedural CT scan. Approximately 80 Ml of purulent fluid was aspirated. The tube was connected to a drainage bag and sutured in place. A dressing was placed. The patient tolerated the procedure well without immediate post procedural complication. IMPRESSION: Successful CT guided placement of a 10 [place] all purpose drain catheter into the presumed diverticular abscess within the ventral aspect the left lower abdomen with aspiration of 80 mL of purulent fluid. Samples were sent to the laboratory as requested by the ordering clinical team. Electronically Signed   By: Sandi Mariscal M.D.   On: 04/25/2017 14:55    Labs:  CBC: Recent Labs    04/24/17 1544 04/25/17 0459 04/26/17 0449 04/27/17 0443  WBC 19.8* 15.4* 14.2* 11.2*  HGB 10.2* 8.3* 8.1* 7.7*  HCT 29.2* 23.9* 23.3* 22.3*  PLT 176 145* 135* 125*    COAGS: Recent Labs    09/27/16 1611 02/01/17 1254 04/25/17 1027  INR 0.99 0.78 1.15  APTT 27  --   --      BMP: Recent Labs    04/24/17 1544 04/25/17 0459 04/26/17 0449 04/27/17 0443 04/28/17 0411  NA 132* 134* 135 134*  --  K 3.6 3.7 3.6 3.8  --   CL 93* 101 102 102  --   CO2 25 24 25 25   --   GLUCOSE 131* 89 78 72  --   BUN 17 16 14 11   --   CALCIUM 10.2 9.6 9.6 9.4  --   CREATININE 0.91 0.89 0.97 0.89 0.88  GFRNONAA >60 >60 >60 >60 >60  GFRAA >60 >60 >60 >60 >60    LIVER FUNCTION TESTS: Recent Labs    04/24/17 1544 04/25/17 0459 04/26/17 0449 04/27/17 0443  BILITOT 4.7* 4.3* 3.4* 2.8*  AST 290* 247* 258* 234*  ALT 46 37 32 31  ALKPHOS 559* 447* 446* 445*  PROT 6.8 6.3* 5.6* 5.4*  ALBUMIN 2.7* 2.4* 2.0* 2.0*    Assessment and Plan: Sharon [name]/p drainage of presumed divert abscess 12/31; afebrile; creat nl; cx- e coli/strept viridans; cont current tx/drain irrigation ; check f/u CT within 1 week of drain placement   Electronically Signed: D. Rowe Robert, PA-C 04/28/2017, 3:54 PM   I spent a total of 15 minutes at the the patient'Sharon [name] bedside AND on the patient'Sharon [name] hospital floor or unit, greater than 50% of which was counseling/coordinating care for left pelvic abscess drain    Patient ID: Sharon [name], female   DOB: [birth year], 62 y.o.   MRN: [id number]

## 2017-04-28 NOTE — Progress Notes (Signed)
Patient Demographics:    Sharon [name], is a 62 y.o. female, DOB - 10-13-1955, NID:[id number]  Admit date - 04/24/2017   Admitting Physician Etta Quill, DO  Outpatient Primary MD for the patient is [name], PA  LOS - 4   Chief Complaint  Patient presents with  . Abdominal Pain        Subjective:    Sharon [name] today has no fevers, no emesis,  No chest pain,   requesting additional oral intake, abdominal pain is better    Assessment  & Plan :    Principal Problem:   Diverticulitis of large intestine with perforation and abscess Active Problems:   COPD, severe ([facility])   SCLC (small cell lung carcinoma), left ([name])   Transaminitis   Postobstructive pneumonia   History of antineoplastic chemotherapy   Maintenance antineoplastic immunotherapy   Dependence on supplemental oxygen   Liver metastasis ([name])   Abscess of abdominal cavity ([name])   Palliative care encounter  Brief Narrative: Patient is a 62 year old female with past medical history of small cell lung cancer with widespread metastatic disease.  Currently on immunotherapy.  Patient presented to the emergency department with complaints of abdominal pain.  CT chest/abdomen/pelvis done in the emergency department showed perforated diverticulitis with abscess with fistula and progression of cancer. S/p IR image guided drainage on 04/25/17  1)Diverticulitis of sigmoid colon with Perforation and Abscess: General Surgery following .  IR performed drainage of the abscess, abscess culture from 04/25/2017 growing strep viridans sensitive and E. coli both to PCN, discussed with pharmacist, there is a  possibility of an anaerobe as well in the abscess cultures from 04/25/17, c/n  Zosyn   started on 04/24/2017. WBC is down to 11.2 from 19.8 on admission , advance diet as per surgical team  2)Metastatic small cell lung cancer with worsening  disease: Case discussed with Dr. Julien Nordmann (oncologist), and has failed extensive aggressive systemic chemotherapy in the past, as well as palliative radiation therapy, She has H/O extensive stage small cell lung cancer status post 6 cycles of systemic chemotherapy with cisplatin and etoposide followed by prophylactic cranial irradiation followed by palliative radiotherapy to a left upper lobe pulmonary nodule followed by stereotactic radiotherapy to solitary brain metastasis. Her metastatic disease looks to have progressed.    Patient is not very likely to benefit from immunotherapy even if she is able to tolerate it.  Palliative care consult appreciated  3)Possible postoperative pneumonia: improved respiratory status, stop vancomycin as discussed above and continue Zosyn which was started on 04/24/2017   4)Transaminitis: Cholestatic picture.  Likely secondary to metastatic disease in liver.  We will continue to monitor CMP.She has mild elevation of AST on baseline.    DVT prophylaxis: SCD Code Status: Full Family Communication: None Disposition Plan: Home vs Hospice   Consultants: Surgery, oncology, palliative  Procedures: IR drainage of diverticular abscess on 04/25/17   DVT Prophylaxis  : SCDs   Lab Results  Component Value Date   PLT 125 (L) 04/27/2017    Inpatient Medications  Scheduled Meds: . ferrous sulfate  325 mg Oral Daily  . fluticasone  1 spray Each Nare Daily  . fluticasone furoate-vilanterol  1 puff Inhalation Daily  . ipratropium-albuterol  3  mL Nebulization BID  . magnesium oxide  400 mg Oral QID  . montelukast  10 mg Oral QHS  . potassium chloride  20 mEq Oral BID  . senna  1 tablet Oral Daily  . sodium chloride flush  5 mL Intravenous Q8H   Continuous Infusions: . sodium chloride 50 mL/hr at 04/28/17 1622  . piperacillin-tazobactam (ZOSYN)  IV 3.375 g (04/28/17 1623)   PRN Meds:.acetaminophen **OR** acetaminophen, albuterol, meclizine, morphine  injection, ondansetron **OR** ondansetron (ZOFRAN) IV, sodium chloride flush    Anti-infectives (From admission, onward)   Start     Dose/Rate Route Frequency Ordered Stop   04/25/17 1800  vancomycin (VANCOCIN) IVPB 1000 mg/200 mL premix     1,000 mg 200 mL/hr over 60 Minutes Intravenous Every 24 hours 04/24/17 2024 04/27/17 2200   04/24/17 2359  piperacillin-tazobactam (ZOSYN) IVPB 3.375 g     3.375 g 12.5 mL/hr over 240 Minutes Intravenous Every 8 hours 04/24/17 2024     04/24/17 1715  piperacillin-tazobactam (ZOSYN) IVPB 3.375 g     3.375 g 100 mL/hr over 30 Minutes Intravenous  Once 04/24/17 1714 04/24/17 1815   04/24/17 1715  vancomycin (VANCOCIN) 1,500 mg in sodium chloride 0.9 % 500 mL IVPB     1,500 mg 250 mL/hr over 120 Minutes Intravenous  Once 04/24/17 1714 04/24/17 2010        Objective:   Vitals:   04/27/17 2116 04/28/17 0437 04/28/17 1054 04/28/17 1348  BP: 100/87 112/63  (!) 102/55  Pulse: 89 86  88  Resp: 18 18  18   Temp: 98.2 F (36.8 C) 97.8 F (36.6 C)  98.4 F (36.9 C)  TempSrc: Oral Oral  Oral  SpO2: 97% 95% 93% 97%  Weight:      Height:        Wt Readings from Last 3 Encounters:  04/25/17 82.2 kg (181 lb 3.5 oz)  04/13/17 82.6 kg (182 lb)  03/29/17 84.6 kg (186 lb 9.6 oz)     Intake/Output Summary (Last 24 hours) at 04/28/2017 1915 Last data filed at 04/28/2017 1815 Gross per 24 hour  Intake 1255 ml  Output 1435 ml  Net -180 ml     Physical Exam  Gen:- Awake Alert,  In no apparent distress  HEENT:- [place].AT, No sclera icterus Neck-Supple Neck,No JVD,.  Lungs-  CTAB , right subclavian Port-A-Cath site is clean dry and intact CV- S1, S2 normal Abd-  +ve B.Sounds, Abd Soft, No tenderness, left lower quadrant drain with sanguinous drainage Extremity/Skin:- Neg homan's  Psych-affect is better  Data Review:   Micro Results Recent Results (from the past 240 hour(s))  Blood culture (routine x 2)     Status: None (Preliminary result)    Collection Time: 04/24/17  5:00 PM  Result Value Ref Range Status   Specimen Description BLOOD CHEST PORTA CATH  Final   Special Requests   Final    BOTTLES DRAWN AEROBIC AND ANAEROBIC Blood Culture adequate volume   Culture   Final    NO GROWTH 4 DAYS Performed at [facility], [place] [address], [place], [place] [address]    Report Status PENDING  Incomplete  Blood culture (routine x 2)     Status: None (Preliminary result)   Collection Time: 04/24/17  5:45 PM  Result Value Ref Range Status   Specimen Description BLOOD CHEST PORTA CATH  Final   Special Requests   Final    BOTTLES DRAWN AEROBIC AND ANAEROBIC Blood Culture results  may not be optimal due to an excessive volume of blood received in culture bottles   Culture   Final    NO GROWTH 4 DAYS Performed at [facility], [place] [address] [place], [place] [address]    Report Status PENDING  Incomplete  Urine culture     Status: None   Collection Time: 04/24/17  9:18 PM  Result Value Ref Range Status   Specimen Description URINE, CATHETERIZED  Final   Special Requests NONE  Final   Culture   Final    NO GROWTH Performed at [facility], [address], [place], [place] [address]    Report Status 04/26/2017 FINAL  Final  MRSA PCR Screening     Status: None   Collection Time: 04/25/17 12:50 PM  Result Value Ref Range Status   MRSA by PCR NEGATIVE NEGATIVE Final    Comment:        The GeneXpert MRSA Assay (FDA approved for NASAL specimens only), is one component of a comprehensive MRSA colonization surveillance program. It is not intended to diagnose MRSA infection nor to guide or monitor treatment for MRSA infections.   Aerobic/Anaerobic Culture (surgical/deep wound)     Status: None (Preliminary result)   Collection Time: 04/25/17  2:55 PM  Result Value Ref Range Status   Specimen Description ABSCESS  Final   Special Requests Normal  Final   Gram Stain   Final    ABUNDANT WBC PRESENT,BOTH PMN AND  MONONUCLEAR ABUNDANT GRAM POSITIVE COCCI IN PAIRS MODERATE GRAM VARIABLE ROD    Culture   Final    ABUNDANT VIRIDANS STREPTOCOCCUS RARE ESCHERICHIA COLI HOLDING FOR POSSIBLE ANAEROBE Performed at [facility], [place] [address], [place], [place] [address]    Report Status PENDING  Incomplete   Organism ID, Bacteria VIRIDANS STREPTOCOCCUS  Final   Organism ID, Bacteria ESCHERICHIA COLI  Final      Susceptibility   Escherichia coli - MIC*    AMPICILLIN 16 INTERMEDIATE Intermediate     CEFAZOLIN <=4 SENSITIVE Sensitive     CEFEPIME <=1 SENSITIVE Sensitive     CEFTAZIDIME <=1 SENSITIVE Sensitive     CEFTRIAXONE <=1 SENSITIVE Sensitive     CIPROFLOXACIN >=4 RESISTANT Resistant     GENTAMICIN <=1 SENSITIVE Sensitive     IMIPENEM <=0.25 SENSITIVE Sensitive     TRIMETH/SULFA <=20 SENSITIVE Sensitive     AMPICILLIN/SULBACTAM 4 SENSITIVE Sensitive     PIP/TAZO <=4 SENSITIVE Sensitive     Extended ESBL NEGATIVE Sensitive     * RARE ESCHERICHIA COLI   Viridans streptococcus - MIC*    PENICILLIN <=0.06 SENSITIVE Sensitive     ERYTHROMYCIN <=0.12 SENSITIVE Sensitive     LEVOFLOXACIN 0.5 SENSITIVE Sensitive     VANCOMYCIN 0.5 SENSITIVE Sensitive     * ABUNDANT VIRIDANS STREPTOCOCCUS    Radiology Reports Dg Chest 2 View  Result Date: 04/24/2017 CLINICAL DATA:  Shortness of breath. All on immunotherapy for lung cancer. Ex-smoker. EXAM: CHEST  2 VIEW COMPARISON:  CT 03/09/2017.  Plain film of 01/09/2017. FINDINGS: Lateral view degraded by patient arm position. Right Port-A-Cath terminates at the low SVC. Midline trachea. Normal heart size. New small left pleural effusion. Probable treatment effects in the left upper lobe laterally. Clear right lung. Left base subsegmental atelectasis. IMPRESSION: New small left pleural effusion with adjacent left base subsegmental atelectasis. Electronically Signed   By: Abigail Miyamoto M.D.   On: 04/24/2017 16:21   Ct Angio Chest Pe  W And/or Wo  Contrast  Result Date: 04/24/2017 CLINICAL DATA:  Extensive stage small cell left lung carcinoma with brain metastases diagnosed July 2017. History of palliative radiotherapy of an enlarging left upper lobe pulmonary nodule. Ongoing medical therapy. Patient presents with generalized abdominal pain and clinical concern for pulmonary embolism. EXAM: CT ANGIOGRAPHY CHEST CT ABDOMEN AND PELVIS WITH CONTRAST TECHNIQUE: Multidetector CT imaging of the chest was performed using the standard protocol during bolus administration of intravenous contrast. Multiplanar CT image reconstructions and MIPs were obtained to evaluate the vascular anatomy. Multidetector CT imaging of the abdomen and pelvis was performed using the standard protocol during bolus administration of intravenous contrast. CONTRAST:  < 100 cc > ISOVUE-370 IOPAMIDOL (ISOVUE-370) INJECTION 76% COMPARISON:  03/09/2017 CT chest and abdomen. Chest radiograph from earlier today. 10/15/2016 CT abdomen/pelvis. FINDINGS: CTA CHEST FINDINGS Cardiovascular: The study is moderate quality for the evaluation of pulmonary embolism, with evaluation of the subsegmental vessels limited by motion. There are no filling defects in the central, lobar, segmental or subsegmental pulmonary artery branches to suggest acute pulmonary embolism. Mildly atherosclerotic nonaneurysmal thoracic aorta. Normal caliber pulmonary arteries. Normal heart size. No significant pericardial fluid/thickening. Right internal jugular MediPort terminates in the right atrium. Mediastinum/Nodes: No discrete thyroid nodules. Unremarkable esophagus. No axillary adenopathy. Newly enlarged 2.0 cm right supraclavicular node (series 7/image 8). Increased right paratracheal adenopathy measuring up to 2.1 cm (series 7/ image 24), previously 1.2 cm. Increased prevascular bilateral mediastinal adenopathy measuring up to 2.0 cm on the right (series 7/ image 26), previously 1.3 cm. Increased 1.9 cm enlarged  subcarinal node (series 7/ image 42), previously 1.1 cm. Newly enlarged 1.8 cm AP window node (series 7/ image 35). Newly moderate infiltrative left hilar adenopathy measuring up to 2.0 cm (series 7/ image 48), with associated extrinsic narrowing of segmental lingular and left lower lobe airways. No right hilar adenopathy. Lungs/Pleura: No pneumothorax. No right pleural effusion. New small dependent left pleural effusion. Mild centrilobular emphysema with mild diffuse bronchial wall thickening. Stable bandlike consolidation in the anterior left upper lobe compatible with radiation fibrosis. Stable irregular solid 1.5 cm left upper lobe pulmonary nodule (series 13/image 40). New patchy consolidation, parenchymal banding and nodularity in the posterior left upper lobe and anterior left lower lobe. A few new scattered solid pulmonary nodules in the right lung, largest 6 mm in the anterior right middle lobe (series 13/ image 76). Musculoskeletal: No aggressive appearing focal osseous lesions. Moderate thoracic spondylosis. Review of the MIP images confirms the above findings. CT ABDOMEN and PELVIS FINDINGS Hepatobiliary: New hepatomegaly. Innumerable hypoenhancing masses replacing much of the liver, significantly increased in size and number, for example a 2.0 cm superior liver mass (series 4/ image 15), increased from 0.7 cm. Normal gallbladder with no radiopaque cholelithiasis. No biliary ductal dilatation. Pancreas: Normal, with no mass or duct dilation. Spleen: Normal size. No mass. Adrenals/Urinary Tract: Newly apparent 1.4 cm left adrenal nodule with density 64 HU. No right adrenal nodule. No hydronephrosis. Subcentimeter hypodense right renal cortical lesion is too small to characterize and is stable. No new renal lesions. Normal bladder. Stomach/Bowel: Normal non-distended stomach. Normal appendix. Mild sigmoid diverticulosis. There is a gas containing 6.0 x 4.9 x 7.2 cm multilocular abscess in the ventral left  lower abdomen (series 4/image 66) with thick enhancing wall, which appears to demonstrate a fistulous connection to the proximal sigmoid colon (series 15/image 70), and which is intimately associated with multiple small bowel loops. No small bowel dilatation. No focal small bowel  caliber transition. No definite small bowel wall thickening. Vascular/Lymphatic: Atherosclerotic nonaneurysmal abdominal aorta. Patent portal, splenic, hepatic and renal veins. No pathologically enlarged lymph nodes in the abdomen or pelvis. Reproductive: Status post hysterectomy, with no abnormal findings at the vaginal cuff. No adnexal mass. Other: No ascites. Musculoskeletal: Previously visualized faintly sclerotic lesions throughout the lumbar vertebral bodies are not discretely visualized on today's scan. Mild lumbar spondylosis. Review of the MIP images confirms the above findings. IMPRESSION: 1. No evidence of pulmonary embolism. 2. Complex gas-containing multilocular abscess in the anterior left lower abdomen with fistulous connection to the proximal sigmoid colon (suggesting the sequela of perforated diverticulitis), intimately associated with multiple small bowel loops. No evidence of bowel obstruction. 3. Significant progression of metastatic disease. Right supraclavicular, bilateral mediastinal and left hilar adenopathy is significantly increased. 4. New extrinsic narrowing of segmental left upper and left lower lobe airways by the infiltrative left hilar adenopathy. New patchy consolidation and nodularity in the posterior left upper lobe and anterior left lower lobe, favor a combination of postobstructive pneumonia and tumor. New scattered probable small pulmonary metastases in the right lung. 5. Liver metastases are significantly increased in size and number with new hepatomegaly. 6. New left adrenal metastasis. 7. New small dependent left pleural effusion. 8. Aortic Atherosclerosis (ICD10-I70.0) and Emphysema (ICD10-J43.9).  Electronically Signed   By: Ilona Sorrel M.D.   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 History of palliative radiotherapy of an enlarging left upper lobe pulmonary nodule. Ongoing medical therapy. Patient presents with generalized abdominal pain and clinical concern for pulmonary embolism. EXAM: CT ANGIOGRAPHY CHEST CT ABDOMEN AND PELVIS WITH CONTRAST TECHNIQUE: Multidetector CT imaging of the chest was performed using the standard protocol during bolus administration of intravenous contrast. Multiplanar CT image reconstructions and MIPs were obtained to evaluate the vascular anatomy. Multidetector CT imaging of the abdomen and pelvis was performed using the standard protocol during bolus administration of intravenous contrast. CONTRAST:  < 100 cc > ISOVUE-370 IOPAMIDOL (ISOVUE-370) INJECTION 76% COMPARISON:  03/09/2017 CT chest and abdomen. Chest radiograph from earlier today. 10/15/2016 CT abdomen/pelvis. FINDINGS: CTA CHEST FINDINGS Cardiovascular: The study is moderate quality for the evaluation of pulmonary embolism, with evaluation of the subsegmental vessels limited by motion. There are no filling defects in the central, lobar, segmental or subsegmental pulmonary artery branches to suggest acute pulmonary embolism. Mildly atherosclerotic nonaneurysmal thoracic aorta. Normal caliber pulmonary arteries. Normal heart size. No significant pericardial fluid/thickening. Right internal jugular MediPort terminates in the right atrium. Mediastinum/Nodes: No discrete thyroid nodules. Unremarkable esophagus. No axillary adenopathy. Newly enlarged 2.0 cm right supraclavicular node (series 7/image 8). Increased right paratracheal adenopathy measuring up to 2.1 cm (series 7/ image 24), previously 1.2 cm. Increased prevascular bilateral mediastinal adenopathy measuring up to  2.0 cm on the right (series 7/ image 26), previously 1.3 cm. Increased 1.9 cm enlarged subcarinal node (series 7/ image 42), previously 1.1 cm. Newly enlarged 1.8 cm AP window node (series 7/ image 35). Newly moderate infiltrative left hilar adenopathy measuring up to 2.0 cm (series 7/ image 48), with associated extrinsic narrowing of segmental lingular and left lower lobe airways. No right hilar adenopathy. Lungs/Pleura: No pneumothorax. No right pleural effusion. New small dependent left pleural effusion. Mild centrilobular emphysema with mild diffuse bronchial wall thickening. Stable bandlike consolidation in the anterior left upper lobe compatible with radiation fibrosis. Stable irregular  solid 1.5 cm left upper lobe pulmonary nodule (series 13/image 40). New patchy consolidation, parenchymal banding and nodularity in the posterior left upper lobe and anterior left lower lobe. A few new scattered solid pulmonary nodules in the right lung, largest 6 mm in the anterior right middle lobe (series 13/ image 76). Musculoskeletal: No aggressive appearing focal osseous lesions. Moderate thoracic spondylosis. Review of the MIP images confirms the above findings. CT ABDOMEN and PELVIS FINDINGS Hepatobiliary: New hepatomegaly. Innumerable hypoenhancing masses replacing much of the liver, significantly increased in size and number, for example a 2.0 cm superior liver mass (series 4/ image 15), increased from 0.7 cm. Normal gallbladder with no radiopaque cholelithiasis. No biliary ductal dilatation. Pancreas: Normal, with no mass or duct dilation. Spleen: Normal size. No mass. Adrenals/Urinary Tract: Newly apparent 1.4 cm left adrenal nodule with density 64 HU. No right adrenal nodule. No hydronephrosis. Subcentimeter hypodense right renal cortical lesion is too small to characterize and is stable. No new renal lesions. Normal bladder. Stomach/Bowel: Normal non-distended stomach. Normal appendix. Mild sigmoid diverticulosis.  There is a gas containing 6.0 x 4.9 x 7.2 cm multilocular abscess in the ventral left lower abdomen (series 4/image 66) with thick enhancing wall, which appears to demonstrate a fistulous connection to the proximal sigmoid colon (series 15/image 70), and which is intimately associated with multiple small bowel loops. No small bowel dilatation. No focal small bowel caliber transition. No definite small bowel wall thickening. Vascular/Lymphatic: Atherosclerotic nonaneurysmal abdominal aorta. Patent portal, splenic, hepatic and renal veins. No pathologically enlarged lymph nodes in the abdomen or pelvis. Reproductive: Status post hysterectomy, with no abnormal findings at the vaginal cuff. No adnexal mass. Other: No ascites. Musculoskeletal: Previously visualized faintly sclerotic lesions throughout the lumbar vertebral bodies are not discretely visualized on today's scan. Mild lumbar spondylosis. Review of the MIP images confirms the above findings. IMPRESSION: 1. No evidence of pulmonary embolism. 2. Complex gas-containing multilocular abscess in the anterior left lower abdomen with fistulous connection to the proximal sigmoid colon (suggesting the sequela of perforated diverticulitis), intimately associated with multiple small bowel loops. No evidence of bowel obstruction. 3. Significant progression of metastatic disease. Right supraclavicular, bilateral mediastinal and left hilar adenopathy is significantly increased. 4. New extrinsic narrowing of segmental left upper and left lower lobe airways by the infiltrative left hilar adenopathy. New patchy consolidation and nodularity in the posterior left upper lobe and anterior left lower lobe, favor a combination of postobstructive pneumonia and tumor. New scattered probable small pulmonary metastases in the right lung. 5. Liver metastases are significantly increased in size and number with new hepatomegaly. 6. New left adrenal metastasis. 7. New small dependent left  pleural effusion. 8. Aortic Atherosclerosis (ICD10-I70.0) and Emphysema (ICD10-J43.9). Electronically Signed   By: Ilona Sorrel M.D.   On: 04/24/2017 18:01   Ct Image Guided Drainage By Percutaneous Catheter  Result Date: 04/25/2017 INDICATION: History of metastatic small cell lung cancer with findings worrisome for advanced metastatic disease. Unfortunately, patient has developed a concomitant presumed diverticular abscess within the ventral aspect of the left lower abdomen. Given poor operative candidacy, request made for CT-guided percutaneous drainage catheter placement for infection source control purposes. EXAM: CT IMAGE GUIDED DRAINAGE BY PERCUTANEOUS CATHETER COMPARISON:  CT abdomen and pelvis - 04/24/2017 MEDICATIONS: The patient is currently admitted to the hospital and receiving intravenous antibiotics. The antibiotics were administered within an appropriate time frame prior to the initiation of the procedure. ANESTHESIA/SEDATION: Moderate (conscious) sedation was employed during this procedure. A total of  Versed 2 mg and Fentanyl 100 mcg was administered intravenously. Moderate Sedation Time: 13 minutes. The patient's level of consciousness and vital signs were monitored continuously by radiology nursing throughout the procedure under my direct supervision. CONTRAST:  None COMPLICATIONS: None immediate. PROCEDURE: Informed written consent was obtained from the patient after a discussion of the risks, benefits and alternatives to treatment. The patient was placed supine on the CT gantry and a pre procedural CT was performed re-demonstrating the known abscess/fluid collection within the ventral aspect of the left lower abdomen/ pelvis with dominant air in fluid containing component measuring approximately 6.8 x 4.7 cm (image 18, series 2). The procedure was planned. A timeout was performed prior to the initiation of the procedure. The skin overlying the ventral aspect of the left lower abdomen was  prepped and draped in the usual sterile fashion. The overlying soft tissues were anesthetized with 1% lidocaine with epinephrine. Appropriate trajectory was planned with the use of a 22 gauge spinal needle. An 18 gauge trocar needle was advanced into the abscess/fluid collection and a short Amplatz super stiff wire was coiled within the collection. Appropriate positioning was confirmed with a limited CT scan. The tract was serially dilated allowing placement of a 10 [place] all-purpose drainage catheter. Appropriate positioning was confirmed with a limited postprocedural CT scan. Approximately 80 Ml of purulent fluid was aspirated. The tube was connected to a drainage bag and sutured in place. A dressing was placed. The patient tolerated the procedure well without immediate post procedural complication. IMPRESSION: Successful CT guided placement of a 10 French all purpose drain catheter into the presumed diverticular abscess within the ventral aspect the left lower abdomen with aspiration of 80 mL of purulent fluid. Samples were sent to the laboratory as requested by the ordering clinical team. Electronically Signed   By: Sandi Mariscal M.D.   On: 04/25/2017 14:55     CBC Recent Labs  Lab 04/24/17 1544 04/25/17 0459 04/26/17 0449 04/27/17 0443  WBC 19.8* 15.4* 14.2* 11.2*  HGB 10.2* 8.3* 8.1* 7.7*  HCT 29.2* 23.9* 23.3* 22.3*  PLT 176 145* 135* 125*  MCV 102.5* 103.9* 105.4* 105.2*  MCH 35.8* 36.1* 36.7* 36.3*  MCHC 34.9 34.7 34.8 34.5  RDW 17.6* 18.2* 18.4* 18.2*  LYMPHSABS 1.6  --  1.3 1.2  MONOABS 0.6  --  0.3 0.2  EOSABS 0.0  --  0.1 0.1  BASOSABS 0.0  --  0.0 0.0    Chemistries  Recent Labs  Lab 04/24/17 1544 04/25/17 0459 04/26/17 0449 04/27/17 0443 04/28/17 0411  NA 132* 134* 135 134*  --   K 3.6 3.7 3.6 3.8  --   CL 93* 101 102 102  --   CO2 25 24 25 25   --   GLUCOSE 131* 89 78 72  --   BUN 17 16 14 11   --   CREATININE 0.91 0.89 0.97 0.89 0.88  CALCIUM 10.2 9.6 9.6 9.4  --    AST 290* 247* 258* 234*  --   ALT 46 37 32 31  --   ALKPHOS 559* 447* 446* 445*  --   BILITOT 4.7* 4.3* 3.4* 2.8*  --    ------------------------------------------------------------------------------------------------------------------ No results for input(s): CHOL, HDL, LDLCALC, TRIG, CHOLHDL, LDLDIRECT in the last 72 hours.  No results found for: HGBA1C ------------------------------------------------------------------------------------------------------------------ No results for input(s): TSH, T4TOTAL, T3FREE, THYROIDAB in the last 72 hours.  Invalid input(s): FREET3 ------------------------------------------------------------------------------------------------------------------ No results for input(s): VITAMINB12, FOLATE, FERRITIN, TIBC, IRON, RETICCTPCT in the  last 72 hours.  Coagulation profile Recent Labs  Lab 04/25/17 1027  INR 1.15    No results for input(s): DDIMER in the last 72 hours.  Cardiac Enzymes No results for input(s): CKMB, TROPONINI, MYOGLOBIN in the last 168 hours.  Invalid input(s): CK ------------------------------------------------------------------------------------------------------------------ No results found for: BNP   Roxan Hockey M.D on 04/28/2017 at 7:15 PM  Between 7am to 7pm - Pager - [phone]  After 7pm go to [email or web address] - password [email or web address]  [facility] -  Office  [phone]   Voice Recognition Viviann Spare dictation system was used to create this note, attempts have been made to correct errors. Please contact the author with questions and/or clarifications.

## 2017-04-28 NOTE — Progress Notes (Signed)
Daily Progress Note   Patient Name: Sharon [name]       Date: 04/28/2017 DOB: March 18, 1956  Age: 62 y.o. MRN#: [id number] Attending Physician: Roxan Hockey, MD Primary Care Physician: [name], [place] Admit Date: 04/24/2017  Reason for Consultation/Follow-up: Establishing goals of care and Hospice Evaluation  Subjective: [name] continues to be resting in bed. No visitors/family at bedside.   Length of Stay: 4  Current Medications: Scheduled Meds:  . ferrous sulfate  325 mg Oral Daily  . fluticasone  1 spray Each Nare Daily  . fluticasone furoate-vilanterol  1 puff Inhalation Daily  . ipratropium-albuterol  3 mL Nebulization BID  . magnesium oxide  400 mg Oral QID  . montelukast  10 mg Oral QHS  . potassium chloride  20 mEq Oral BID  . senna  1 tablet Oral Daily  . sodium chloride flush  5 mL Intravenous Q8H    Continuous Infusions: . sodium chloride 50 mL/hr at 04/28/17 1622  . piperacillin-tazobactam (ZOSYN)  IV 3.375 g (04/28/17 1623)    PRN Meds: acetaminophen **OR** acetaminophen, albuterol, meclizine, morphine injection, ondansetron **OR** ondansetron (ZOFRAN) IV, sodium chloride flush  Physical Exam       Constitutional: She is oriented to person, place, and time. She appears well-developed. She has a sickly appearance.  HENT:  Head: Normocephalic and atraumatic.  Cardiovascular: Regular rhythm.  Pulmonary/Chest: No accessory muscle usage. No tachypnea. No respiratory distress.  Abdominal:  Drain in place with scant bloody drainage.  Neurological: She is alert and oriented to person, place, and time.  Nursing note and vitals reviewed.   Vital Signs: BP (!) 102/55 (BP Location: Right Wrist)   Pulse 88   Temp 98.4 F (36.9 C) (Oral)   Resp 18   Ht '5\' 7"'   (1.702 m)   Wt 82.2 kg (181 lb 3.5 oz)   SpO2 97%   BMI 28.38 kg/m  SpO2: SpO2: 97 % O2 Device: O2 Device: Nasal Cannula O2 Flow Rate: O2 Flow Rate (L/min): 2 L/min  Intake/output summary:   Intake/Output Summary (Last 24 hours) at 04/28/2017 1821 Last data filed at 04/28/2017 1815 Gross per 24 hour  Intake 1260 ml  Output 1425 ml  Net -165 ml   LBM: Last BM Date: 04/26/17 Baseline Weight: Weight: 82.2 kg (181 lb 3.5  oz) Most recent weight: Weight: 82.2 kg (181 lb 3.5 oz)       Palliative Assessment/Data: 50%   Flowsheet Rows     Most Recent Value  Intake Tab  Referral Department  Hospitalist  Unit at Time of Referral  Oncology Unit  Palliative Care Primary Diagnosis  Cancer  Date Notified  04/26/17  Palliative Care Type  Return patient Palliative Care  Reason for referral  Clarify Goals of Care  Date of Admission  04/24/17  Date first seen by Palliative Care  04/27/17  # of days Palliative referral response time  1 Day(s)  # of days IP prior to Palliative referral  2  Clinical Assessment  Psychosocial & Spiritual Assessment  Palliative Care Outcomes      Patient Active Problem List   Diagnosis Date Noted  . Palliative care encounter   . History of antineoplastic chemotherapy 04/26/2017  . Maintenance antineoplastic immunotherapy 04/26/2017  . Dependence on supplemental oxygen 04/26/2017  . Liver metastasis ([place]) 04/26/2017  . Abscess of abdominal cavity ([place])   . Diverticulitis of large intestine with perforation and abscess 04/24/2017  . Transaminitis 04/24/2017  . Postobstructive pneumonia 04/24/2017  . Goals of care, counseling/discussion 03/11/2017  . Hypomagnesemia 01/27/2017  . Chemotherapy induced neutropenia ([place]) 12/17/2016  . Hypokalemia 12/17/2016  . Partial small bowel obstruction ([place]) 10/15/2016  . Brain tumor ([place])   . Seizure ([place]) 09/28/2016  . Brain metastasis ([place]) 09/27/2016  . Hemoptysis 08/17/2016  . Financial difficulties  07/01/2016  . Personal history of breast cancer 03/29/2016  . Encounter for antineoplastic chemotherapy 01/05/2016  . Nausea without vomiting 12/15/2015  . SCLC (small cell lung carcinoma), left ([place]) 11/18/2015  . Mediastinal adenopathy   . Breast cancer ([place]) 07/18/2012  . 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 Smoking 08/18/2011    Palliative Care Assessment & Plan   HPI: 62 y.o. female  with past medical history of COPD (followed with Dr. Ashok Cordia), h/o cancer of right breast, HTN, extensive and progressing SCLC (follows with Dr. Julien Nordmann) admitted from home on 04/24/2017 with abd pain and found to diverticulitis with abscess formation treated with drain placement by IR for abscess and antibiotics. Possibly a fistula that could require long term drain - IR following for recommendations. Palliative care requested to continue [place] discussions initiated by Dr. Julien Nordmann.   Assessment: I met again today with Sharon [name]. She tells me she has had a more restful day today than yesterday. She shares that her children are planning to come today or tomorrow. She shares some stories about her family and friends. She continues to be in good spirits and hopeful for more time with her friends and family.   We further discussed GOC. She has been clear that she is interested in considering future immunotherapy (but still considering the risks vs benefits). We were able to discuss Advance Directive/Living Will. She says that she believes her daughter is documented HCPOA but unsure. She wants all 3 of her children to make decisions together. We discussed the importance of sharing her wishes with them if they are ever placed in that position. She shares that she has been considering her wishes and would be interested in discussing this with her children. She says that she does not believe she wants to be resuscitated but wishes to discuss with her children before making a final decision. She wishes to be cremated. She speaks  of these decisions without any distress or anxiety. She would like for [place] to meet  with her and her children this weekend to discuss further.   Recommendations/Plan:  Will attempt to arrange palliative meeting with patient and her children at her request.   Chronic pain chest tightness/ache and SOB: Morphine while in hospital. May utilize her Percocet as prescribed at home. Understands that with worsening disease she may need more assistance with symptom management. Consider transition off IV to oral pain meds.    Goals of Care and Additional Recommendations:  Limitations on Scope of Treatment: Full Scope Treatment  Code Status:  Full code - considering DNR  Prognosis:   < 6 months  Discharge Planning:  To Be Determined  Thank you for allowing the Palliative Medicine Team to assist in the care of this patient.   Total Time 40 min Prolonged Time Billed  no       Greater than 50%  of this time was spent counseling and coordinating care related to the above assessment and plan.  Vinie Sill, NP Palliative Medicine Team Pager # [phone] (M-F 8a-5p) Team Phone # [phone] (Nights/Weekends)

## 2017-04-29 ENCOUNTER — Telehealth: Payer: Self-pay | Admitting: Medical Oncology

## 2017-04-29 ENCOUNTER — Inpatient Hospital Stay (HOSPITAL_COMMUNITY): Payer: 59

## 2017-04-29 DIAGNOSIS — Z66 Do not resuscitate: Secondary | ICD-10-CM

## 2017-04-29 LAB — COMPREHENSIVE METABOLIC PANEL
ALT: 30 U/L (ref 14–54)
ANION GAP: 6 (ref 5–15)
AST: 205 U/L — ABNORMAL HIGH (ref 15–41)
Albumin: 2.1 g/dL — ABNORMAL LOW (ref 3.5–5.0)
Alkaline Phosphatase: 492 U/L — ABNORMAL HIGH (ref 38–126)
BUN: 9 mg/dL (ref 6–20)
CHLORIDE: 101 mmol/L (ref 101–111)
CO2: 27 mmol/L (ref 22–32)
Calcium: 9.6 mg/dL (ref 8.9–10.3)
Creatinine, Ser: 0.88 mg/dL (ref 0.44–1.00)
GFR calc non Af Amer: >60 mL/min (ref >60)
Glucose, Bld: 82 mg/dL (ref 65–99)
Potassium: 3.7 mmol/L (ref 3.5–5.1)
SODIUM: 134 mmol/L — AB (ref 135–145)
Total Bilirubin: 2.7 mg/dL — ABNORMAL HIGH (ref 0.3–1.2)
Total Protein: 5.7 g/dL — ABNORMAL LOW (ref 6.5–8.1)

## 2017-04-29 LAB — CBC
HCT: 22.9 % — ABNORMAL LOW (ref 36.0–46.0)
Hemoglobin: 7.8 g/dL — ABNORMAL LOW (ref 12.0–15.0)
MCH: 36.1 pg — AB (ref 26.0–34.0)
MCHC: 34.1 g/dL (ref 30.0–36.0)
MCV: 106 fL — ABNORMAL HIGH (ref 78.0–100.0)
PLATELETS: 120 10*3/uL — AB (ref 150–400)
RBC: 2.16 MIL/uL — AB (ref 3.87–5.11)
RDW: 18.4 % — ABNORMAL HIGH (ref 11.5–15.5)
WBC: 9.3 10*3/uL (ref 4.0–10.5)

## 2017-04-29 LAB — CULTURE, BLOOD (ROUTINE X 2)
CULTURE: NO GROWTH
CULTURE: NO GROWTH
SPECIAL REQUESTS: ADEQUATE

## 2017-04-29 MED ORDER — MORPHINE SULFATE (PF) 2 MG/ML IV SOLN
2.0000 mg | INTRAVENOUS | Status: DC | PRN
  Administered 2017-04-29 – 2017-05-04 (×24): 2 mg via INTRAVENOUS
  Filled 2017-04-29: qty 1
  Filled 2017-04-29: qty 2
  Filled 2017-04-29 (×23): qty 1

## 2017-04-29 MED ORDER — IOPAMIDOL (ISOVUE-300) INJECTION 61%
100.0000 mL | Freq: Once | INTRAVENOUS | Status: AC | PRN
  Administered 2017-04-29: 100 mL via INTRAVENOUS

## 2017-04-29 MED ORDER — BISACODYL 10 MG RE SUPP
10.0000 mg | Freq: Once | RECTAL | Status: AC
  Administered 2017-04-30: 10 mg via RECTAL
  Filled 2017-04-29 (×2): qty 1

## 2017-04-29 MED ORDER — POLYETHYLENE GLYCOL 3350 17 G PO PACK
17.0000 g | PACK | Freq: Every day | ORAL | Status: DC
  Administered 2017-04-29 – 2017-05-01 (×3): 17 g via ORAL
  Filled 2017-04-29 (×6): qty 1

## 2017-04-29 MED ORDER — GUAIFENESIN ER 600 MG PO TB12: 1200.0000 mg | ORAL_TABLET | Freq: Two times a day (BID) | ORAL | Status: DC | PRN

## 2017-04-29 MED ORDER — MORPHINE SULFATE (PF) 2 MG/ML IV SOLN: 2.0000 mg | INTRAVENOUS | Status: DC | PRN

## 2017-04-29 MED ORDER — SENNA 8.6 MG PO TABS
2.0000 | ORAL_TABLET | Freq: Every day | ORAL | Status: DC
  Administered 2017-04-30: 17.2 mg via ORAL
  Filled 2017-04-29: qty 2

## 2017-04-29 MED ORDER — IOPAMIDOL (ISOVUE-300) INJECTION 61%
INTRAVENOUS | Status: AC
  Filled 2017-04-29: qty 100

## 2017-04-29 NOTE — Progress Notes (Signed)
Pharmacy Antibiotic Note  Sharon [name] is a 62 y.o. female with metastatic lung cancer presented to the ED on 04/24/2017 with c/o abd pain. Abd CTA on 12/30 showed multilocular abscess in the anterior left lower abdomen and new patchy consolidation and nodularity in the posterior left upper lobe and anterior left lower lobe with suspicion for PNA/tumor.  Broad spectrum abx started on admission for PNA and abscess.  - 12/31: IR placed diverticular abscess drain   Today, 04/29/2017: - day #5 zosyn - afeb, wbc down wnl - scr stable (crcl~ 74) - abscess culture is positive for strep viridan and Ecoli  Plan: - Zosyn 3.375 gm IV q8h (infuse over 4 hrs) - with noted strep viridan and Ecoli in abscess culture, consider de-esc abx to ceftriaxone and flagyl (this has anaerobic coverage). - monitor renal function and f/u with cultures ___________________________________  Height: 5\' 7"  (170.2 cm) Weight: 181 lb 3.5 oz (82.2 kg) IBW/kg (Calculated) : 61.6  Temp (24hrs), Avg:98.3 F (36.8 C), Min:98.1 F (36.7 C), Max:98.4 F (36.9 C)  Recent Labs  Lab 04/24/17 1544 04/24/17 1754 04/25/17 0459 04/26/17 0449 04/27/17 0443 04/28/17 0411 04/29/17 0451  WBC 19.8*  --  15.4* 14.2* 11.2*  --  9.3  CREATININE 0.91  --  0.89 0.97 0.89 0.88 0.88  LATICACIDVEN  --  1.27  --   --   --   --   --     Estimated Creatinine Clearance: 74.9 mL/min (by C-G formula based on SCr of 0.88 mg/dL).    No Known Allergies  Antimicrobials this admission:  12/30 vanc >>  1/2 12/30 zosyn >>    Dose adjustments this admission:  --  Microbiology results:  12/30 BCx x2: ngtd 12/30 UCx: NGF 12/31  MRSA PCR: neg 12/31 HIV neg 12/31 abscess: moderate GVR, rare GNR; strep viridan (S= erythro, LVQ, Vanc, PCN), Ecoli (I= amp, R= cipro)  Thank you for allowing pharmacy to be a part of this patient's care.  Lynelle Doctor 04/29/2017 10:15 AM

## 2017-04-29 NOTE — Progress Notes (Signed)
Referring Physician(s): Gross,S  Supervising Physician: Aletta Edouard  Patient Status:  [facility] - In-pt  Chief Complaint:  Pelvic abscess  Subjective:  Pt c/o RLQ pain; 7/10 and new for her; denies N/V; no recent BM; passing flatus; mild tenderness LLQ drain site  Allergies: Patient has no known allergies.  Medications: Prior to Admission medications   Medication Sig Start Date End Date Taking? Authorizing Provider  albuterol (PROVENTIL HFA;VENTOLIN HFA) 108 (90 Base) MCG/ACT inhaler Inhale 1 puff into the lungs every 6 (six) hours as needed for wheezing or shortness of breath.   Yes [provider]  budesonide-formoterol (SYMBICORT) 160-4.5 MCG/ACT inhaler Inhale 2 puffs into the lungs 2 (two) times daily. 08/17/16  Yes Javier Glazier, MD  ferrous sulfate 325 (65 FE) MG EC tablet Take 325 mg by mouth daily.   Yes [provider]  fluticasone (FLONASE) 50 MCG/ACT nasal spray Place 1 spray into both nostrils daily.  08/20/16  Yes [provider]  Ipratropium-Albuterol (COMBIVENT RESPIMAT) 20-100 MCG/ACT AERS respimat Inhale 1 puff into the lungs every 6 (six) hours.   Yes [provider]  loperamide (IMODIUM) 2 MG capsule Take by mouth as needed for diarrhea or loose stools.   Yes [provider]  LORazepam (ATIVAN) 0.5 MG tablet 1 tablet po 30 minutes prior to radiation or MRI 09/28/16  Yes Hayden Pedro, PA-C  magnesium oxide (MAG-OX) 400 (241.3 Mg) MG tablet Take 1 tablet (400 mg total) by mouth 4 (four) times daily. 01/26/17  Yes Curcio, Roselie Awkward, NP  meclizine (ANTIVERT) 12.5 MG tablet Take 1-2 tablets (12.5-25 mg total) by mouth 3 (three) times daily as needed for dizziness. 06/05/16  Yes Virgel Manifold, MD  methylPREDNISolone (MEDROL) 4 MG tablet Take 6 tabs on day 1, 5 tabs on day 2, 4 tabs on day 3, 3 tabs on day 4, 2 tabs on day 5, 1 tab on day 6, and then stop 04/13/17  Yes Curcio, Roselie Awkward, NP  montelukast (SINGULAIR)  10 MG tablet Take 10 mg by mouth at bedtime.  09/06/16  Yes [provider]  ondansetron (ZOFRAN) 8 MG tablet Take 1 tablet (8 mg total) every 8 (eight) hours as needed by mouth for nausea or vomiting. 03/11/17  Yes Curcio, Roselie Awkward, NP  polyethylene glycol powder (GLYCOLAX/MIRALAX) powder Take 1 Container by mouth daily.   Yes [provider]  potassium chloride 20 MEQ/15ML (10%) SOLN Take 15 mLs (20 mEq total) by mouth 2 (two) times daily. 02/25/17  Yes Curcio, Roselie Awkward, NP  prochlorperazine (COMPAZINE) 10 MG tablet TAKE 1 TABLET BY MOUTH EVERY 6 HOURS AS NEEDED FOR NAUSEA AND VOMITING 02/11/17  Yes Curcio, Roselie Awkward, NP  SANTYL ointment APP TOPICALLY UTD 12/08/16  Yes [provider]  senna (SENOKOT) 8.6 MG TABS tablet Take 1 tablet (8.6 mg total) by mouth daily. 10/17/16  Yes Patrecia Pour, MD  sucralfate (CARAFATE) 1 GM/10ML suspension Take 10 mLs (1 g total) by mouth 4 (four) times daily -  with meals and at bedtime. 01/19/17  Yes Gery Pray, MD  temozolomide (TEMODAR) 140 MG capsule Take 2 capsules (280 mg total) daily by mouth. Take for days 1-5 every 28 days. May take on empty stomach & at bedtime to decrease N&V. 03/15/17  Yes Curt Bears, MD  UNABLE TO FIND Take 10 mLs by mouth daily as needed. Med Name: CBD Oil   Yes [provider]     Vital Signs: BP Marland Kitchen)  113/59 (BP Location: Right Wrist)   Pulse (!) 103   Temp 98.7 F (37.1 C) (Oral)   Resp 16   Ht 5\' 7"  (1.702 m)   Wt 181 lb 3.5 oz (82.2 kg)   SpO2 95%   BMI 28.38 kg/m   Physical Exam LLQ drain intact, output 20 cc bloody fluid today; drain irrigated and flush=return amt; insertion site ok, mildly tender, mod tender RLQ  Imaging: No results found.  Labs:  CBC: Recent Labs    04/25/17 0459 04/26/17 0449 04/27/17 0443 04/29/17 0451  WBC 15.4* 14.2* 11.2* 9.3  HGB 8.3* 8.1* 7.7* 7.8*  HCT 23.9* 23.3* 22.3* 22.9*  PLT 145* 135* 125* 120*    COAGS: Recent Labs     09/27/16 1611 02/01/17 1254 04/25/17 1027  INR 0.99 0.78 1.15  APTT 27  --   --     BMP: Recent Labs    04/25/17 0459 04/26/17 0449 04/27/17 0443 04/28/17 0411 04/29/17 0451  NA 134* 135 134*  --  134*  K 3.7 3.6 3.8  --  3.7  CL 101 102 102  --  101  CO2 24 25 25   --  27  GLUCOSE 89 78 72  --  82  BUN 16 14 11   --  9  CALCIUM 9.6 9.6 9.4  --  9.6  CREATININE 0.89 0.97 0.89 0.88 0.88  GFRNONAA >60 >60 >60 >60 >60  GFRAA >60 >60 >60 >60 >60    LIVER FUNCTION TESTS: Recent Labs    04/25/17 0459 04/26/17 0449 04/27/17 0443 04/29/17 0451  BILITOT 4.3* 3.4* 2.8* 2.7*  AST 247* 258* 234* 205*  ALT 37 32 31 30  ALKPHOS 447* 446* 445* 492*  PROT 6.3* 5.6* 5.4* 5.7*  ALBUMIN 2.4* 2.0* 2.0* 2.1*    Assessment and Plan: S/p drainage of presumed divert abscess 12/31; afebrile; creat nl;  WBC nl; hgb 7.8(7.7); cx- e coli/strept viridans; has new RLQ pain today; drain output remains bloody and when irrigated flush= output; will check f/u CT       Electronically Signed: D. Rowe Robert, PA-C 04/29/2017, 4:07 PM   I spent a total of 15 minutes at the the patient's bedside AND on the patient's hospital floor or unit, greater than 50% of which was counseling/coordinating care for pelvic abscess    Patient ID: Sharon [name], female   DOB: 04-Jun-1955, 62 y.o.   MRN: [id number]

## 2017-04-29 NOTE — Progress Notes (Signed)
Daily Progress Note   Patient Name: Sharon [name]       Date: 04/29/2017 DOB: 1955-12-28  Age: 62 y.o. MRN#: [id number] Attending Physician: Roxan Hockey, MD Primary Care Physician: [name], [place] Admit Date: 04/24/2017  Reason for Consultation/Follow-up: Establishing goals of care and Hospice Evaluation  Subjective: Sharon [name] [name]. No BM yet but continued + flatus [name].   Length of Stay: 5  Current Medications: Scheduled Meds:  . ferrous sulfate  325 mg Oral Daily  . fluticasone  1 spray Each Nare Daily  . fluticasone furoate-vilanterol  1 puff Inhalation Daily  . ipratropium-albuterol  3 mL Nebulization BID  . magnesium oxide  400 mg Oral QID  . montelukast  10 mg Oral QHS  . potassium chloride  20 mEq Oral BID  . senna  1 tablet Oral Daily  . sodium chloride flush  5 mL Intravenous Q8H    Continuous Infusions: . sodium chloride 50 mL/hr (04/29/17 0045)  . piperacillin-tazobactam (ZOSYN)  IV 3.375 g (04/29/17 0807)    PRN Meds: acetaminophen **OR** acetaminophen, albuterol, guaiFENesin, meclizine, morphine injection, ondansetron **OR** ondansetron (ZOFRAN) IV, sodium chloride flush  Physical Exam       Constitutional: She is oriented to person, place, and time. She appears well-developed. She has a sickly appearance.  HENT:  Head: Normocephalic and atraumatic.  Cardiovascular: Regular rhythm.  Pulmonary/Chest: No accessory muscle usage. No tachypnea. No respiratory distress.  Abdominal:  Drain in place with scant bloody drainage. RUQ tenderness. Neurological: She is alert and oriented to person, place, and time.  Nursing note and vitals reviewed.   Vital Signs: BP (!) 113/59 (BP Location: Right Wrist)   Pulse (!) 103    Temp 98.7 F (37.1 C) (Oral)   Resp 16   Ht 5\' 7"  (1.702 m)   Wt 82.2 kg (181 lb 3.5 oz)   SpO2 95%   BMI 28.38 kg/m  SpO2: SpO2: 95 % O2 Device: O2 Device: Nasal Cannula O2 Flow Rate: O2 Flow Rate (L/min): 2 L/min  Intake/output summary:   Intake/Output Summary (Last 24 hours) at 04/29/2017 1600 Last data filed at 04/29/2017 1345 Gross per 24 hour  Intake 1370.83 ml  Output 1345 ml  Net 25.83 ml   LBM: Last BM Date: 04/26/17  Baseline Weight: Weight: 82.2 kg (181 lb 3.5 oz) Most recent weight: Weight: 82.2 kg (181 lb 3.5 oz)       Palliative Assessment/Data: 50%   Flowsheet Rows     Most Recent Value  Intake Tab  Referral Department  Hospitalist  Unit at Time of Referral  Oncology Unit  Palliative Care Primary Diagnosis  Cancer  Date Notified  04/26/17  Palliative Care Type  Return patient Palliative Care  Reason for referral  Clarify Goals of Care  Date of Admission  04/24/17  Date first seen by Palliative Care  04/27/17  # of days Palliative referral response time  1 Day(s)  # of days IP prior to Palliative referral  2  Clinical Assessment  Psychosocial & Spiritual Assessment  Palliative Care Outcomes      Patient Active Problem List   Diagnosis Date Noted  . Palliative care encounter   . History of antineoplastic chemotherapy 04/26/2017  . Maintenance antineoplastic immunotherapy 04/26/2017  . Dependence on supplemental oxygen 04/26/2017  . Liver metastasis ([place]) 04/26/2017  . Abscess of abdominal cavity ([place])   . Diverticulitis of large intestine with perforation and abscess 04/24/2017  . Transaminitis 04/24/2017  . Postobstructive pneumonia 04/24/2017  . Goals of care, counseling/discussion 03/11/2017  . Hypomagnesemia 01/27/2017  . Chemotherapy induced neutropenia ([place]) 12/17/2016  . Hypokalemia 12/17/2016  . Partial small bowel obstruction ([place]) 10/15/2016  . Brain tumor ([place])   . Seizure ([place]) 09/28/2016  . Brain metastasis ([place]) 09/27/2016  .  Hemoptysis 08/17/2016  . Financial difficulties 07/01/2016  . Personal history of breast cancer 03/29/2016  . Encounter for antineoplastic chemotherapy 01/05/2016  . Nausea without vomiting 12/15/2015  . SCLC (small cell lung carcinoma), left ([place]) 11/18/2015  . Mediastinal adenopathy   . Breast cancer ([place]) 07/18/2012  . COPD, severe ([place]) 08/18/2011  . Smoking 08/18/2011    Palliative Care Assessment & Plan   HPI: 62 y.o. female  with past medical history of COPD (followed with Sharon [name]), h/o cancer of right breast, HTN, extensive and progressing SCLC (follows with Sharon [name]) admitted from home on 04/24/2017 with abd [name] and found to diverticulitis with abscess formation treated with drain placement by IR for abscess and antibiotics. Possibly a fistula that could require long term drain - IR following for recommendations. Palliative care requested to continue [place] discussions initiated by Sharon [name].   Assessment: Sharon [name] but new [name] on RIGHT abd and not left. Likely will need repeat scan before Monday. Possibly d/t constipation? - tender to palpation. Her daughter and son-in-law are at bedside.   I had long conversation with Sharon [name] along with her daughter. Explained to family progression of cancer, current medical issues, and onc recommendations for hospice. Discussed further Sharon [name] hesitancy for hospice and desire for immunotherapy. We discussed options for discharge including outpatient palliative vs hospice, options at home, and ST rehab. Determined that they desire ST rehab if indicated with palliative to follow and likely hospice services after rehab. Daughter also agrees with DNR status and will discuss with her brothers (but says she knows they will agree with herself and her mother's wishes).   All questions/concerns addressed. Emotional support provided.   Recommendations/Plan:  DNR  Pursue short term rehab.  Considering palliative vs  hospice options. She would be a candidate for [facility] program.   Chronic [name] chest tightness/ache and SOB: Morphine while in hospital. May utilize her Percocet as prescribed at home. Understands  that with worsening disease she may need more assistance with symptom management. Consider transition off IV to oral [name] meds.   New right abd [name]: Increased morphine 2-4 mg IV to every 2 hours prn. Recommend repeat CT scan to assess further.    Goals of Care and Additional Recommendations:  Limitations on Scope of Treatment: Full Scope Treatment  Code Status:  DNR  Prognosis:   < 6 months  Discharge Planning:  To Be Determined. Likely SNF rehab with palliative to follow.   Thank you for allowing the Palliative Medicine Team to assist in the care of this patient.   Total Time 50 min Prolonged Time Billed  no       Greater than 50%  of this time was spent counseling and coordinating care related to the above assessment and plan.  Vinie Sill, NP Palliative Medicine Team Pager # [phone] (M-F 8a-5p) Team Phone # [phone] (Nights/Weekends)

## 2017-04-29 NOTE — Progress Notes (Signed)
Patient Demographics:    Sharon [name], is a 62 y.o. female, DOB - [birth year], CMK:[id number]  Admit date - 04/24/2017   Admitting Physician Etta Quill, DO  Outpatient Primary MD for the patient is [name], PA  LOS - 5   Chief Complaint  Patient presents with  . Abdominal Pain        Subjective:    Sharon [name] today has no fevers, no emesis,  No chest pain,   Tolerating solid food well, c/o RT LQ pain which is new , female friend at bedside   Assessment  & Plan :    Principal Problem:   Diverticulitis of large intestine with perforation and abscess Active Problems:   COPD, severe ([name])   SCLC (small cell lung carcinoma), left ([name])   Transaminitis   Postobstructive pneumonia   History of antineoplastic chemotherapy   Maintenance antineoplastic immunotherapy   Dependence on supplemental oxygen   Liver metastasis ([name])   Abscess of abdominal cavity ([name])   Palliative care encounter  Brief Narrative: Patient is a 62 year old female with past medical history of small cell lung cancer with widespread metastatic disease.  Currently on immunotherapy.  Patient presented to the emergency department with complaints of abdominal pain.  CT chest/abdomen/pelvis done in the emergency department showed perforated diverticulitis with abscess with fistula and progression of cancer. S/p IR image guided drainage on 04/25/17  1)Acute Diverticulitis of Sigmoid Colon with Perforation and Abscess: Leukocytosis has resolved at this time with a white count now below 10, afebrile General Surgery following .  IR performed drainage of the abscess, abscess culture from 04/25/2017 growing strep viridans sensitive and E. coli both to PCN, discussed with pharmacist, there is a  possibility of an anaerobe as well in the abscess cultures from 04/25/17, c/n  Zosyn   started on 04/24/2017. WBC is down to 11.2 from 19.8 on  admission , c/n solid diet  2)New Rt LQ Pain-interventional radiology as ordered CT abdomen and pelvis with contrast to evaluate this further  3)Metastatic small cell lung cancer with worsening disease: Case discussed with Dr. Julien Nordmann (oncologist), and has failed extensive aggressive systemic chemotherapy in the past, as well as palliative radiation therapy, She has H/O extensive stage small cell lung cancer status post 6 cycles of systemic chemotherapy with cisplatin and etoposide followed by prophylactic cranial irradiation followed by palliative radiotherapy to a left upper lobe pulmonary nodule followed by stereotactic radiotherapy to solitary brain metastasis. Her metastatic disease looks to have progressed.    Patient is not very likely to benefit from immunotherapy even if she is able to tolerate it.  Palliative care consult appreciated   4)Possible postoperative pneumonia: respiratory status continues to improve improved respiratory status, stop vancomycin as discussed above and continue Zosyn which was started on 04/24/2017   5)Transaminitis: Cholestatic picture.  Likely secondary to metastatic disease in liver.  We will continue to monitor CMP.She has mild elevation of AST on baseline.  6)Social/Ethics- Palliative consult appreciated, patient is now a DNR/DNI  7)Anemia- hemoglobin is down to 7.8, patient is largely asymptomatic, without transfusion if symptomatic or if hematocrit is 21 or lower  DVT prophylaxis: SCD Code Status:  DNR/DNI Family Communication:  daughter/son in Sports coach Disposition Plan:  SNF rehab with palliative care consult   Consultants: Surgery, oncology, palliative  Procedures: IR drainage of diverticular abscess on 04/25/17   DVT Prophylaxis  : SCDs   Lab Results  Component Value Date   PLT 120 (L) 04/29/2017    Inpatient Medications  Scheduled Meds: . ferrous sulfate  325 mg Oral Daily  . fluticasone  1 spray Each Nare Daily  . fluticasone  furoate-vilanterol  1 puff Inhalation Daily  . ipratropium-albuterol  3 mL Nebulization BID  . magnesium oxide  400 mg Oral QID  . montelukast  10 mg Oral QHS  . potassium chloride  20 mEq Oral BID  . [START ON 04/30/2017] senna  2 tablet Oral Daily  . sodium chloride flush  5 mL Intravenous Q8H   Continuous Infusions: . sodium chloride 50 mL/hr (04/29/17 0045)  . piperacillin-tazobactam (ZOSYN)  IV 3.375 g (04/29/17 1614)   PRN Meds:.acetaminophen **OR** acetaminophen, albuterol, guaiFENesin, meclizine, morphine injection, ondansetron **OR** ondansetron (ZOFRAN) IV, sodium chloride flush    Anti-infectives (From admission, onward)   Start     Dose/Rate Route Frequency Ordered Stop   04/25/17 1800  vancomycin (VANCOCIN) IVPB 1000 mg/200 mL premix     1,000 mg 200 mL/hr over 60 Minutes Intravenous Every 24 hours 04/24/17 2024 04/27/17 2200   04/24/17 2359  piperacillin-tazobactam (ZOSYN) IVPB 3.375 g     3.375 g 12.5 mL/hr over 240 Minutes Intravenous Every 8 hours 04/24/17 2024     04/24/17 1715  piperacillin-tazobactam (ZOSYN) IVPB 3.375 g     3.375 g 100 mL/hr over 30 Minutes Intravenous  Once 04/24/17 1714 04/24/17 1815   04/24/17 1715  vancomycin (VANCOCIN) 1,500 mg in sodium chloride 0.9 % 500 mL IVPB     1,500 mg 250 mL/hr over 120 Minutes Intravenous  Once 04/24/17 1714 04/24/17 2010        Objective:   Vitals:   04/28/17 2134 04/29/17 0502 04/29/17 0818 04/29/17 1345  BP:  (!) 101/58  (!) 113/59  Pulse:  87  (!) 103  Resp:  16  16  Temp:  98.1 F (36.7 C)  98.7 F (37.1 C)  TempSrc:  Oral  Oral  SpO2: 94% 98% 95% 95%  Weight:      Height:        Wt Readings from Last 3 Encounters:  04/25/17 82.2 kg (181 lb 3.5 oz)  04/13/17 82.6 kg (182 lb)  03/29/17 84.6 kg (186 lb 9.6 oz)     Intake/Output Summary (Last 24 hours) at 04/29/2017 1647 Last data filed at 04/29/2017 1345 Gross per 24 hour  Intake 1370.83 ml  Output 1145 ml  Net 225.83 ml     Physical  Exam  Gen:- Awake Alert,  In no apparent distress  HEENT:- [place].AT,   Neck-Supple Neck,No JVD,.  Lungs-  CTAB , right subclavian Port-A-Cath site is clean dry and intact CV- S1, S2 normal Abd-  +ve B.Sounds, Abd Soft, right lower quadrant tenderness which is new, left lower quadrant drain with sanguinous drainage Extremity/Skin:- Neg homan's  Psych-affect is appropriate  Data Review:   Micro Results Recent Results (from the past 240 hour(s))  Blood culture (routine x 2)     Status: None   Collection Time: 04/24/17  5:00 PM  Result Value Ref Range Status   Specimen Description BLOOD CHEST PORTA CATH  Final   Special Requests   Final    BOTTLES DRAWN AEROBIC AND ANAEROBIC Blood Culture adequate volume   Culture  Final    NO GROWTH 5 DAYS Performed at [facility], [place] [address] [place], [place] [address]    Report Status 04/29/2017 FINAL  Final  Blood culture (routine x 2)     Status: None   Collection Time: 04/24/17  5:45 PM  Result Value Ref Range Status   Specimen Description BLOOD CHEST PORTA CATH  Final   Special Requests   Final    BOTTLES DRAWN AEROBIC AND ANAEROBIC Blood Culture results may not be optimal due to an excessive volume of blood received in culture bottles   Culture   Final    NO GROWTH 5 DAYS Performed at [facility], [place] [address] [place], [place] [address]    Report Status 04/29/2017 FINAL  Final  Urine culture     Status: None   Collection Time: 04/24/17  9:18 PM  Result Value Ref Range Status   Specimen Description URINE, CATHETERIZED  Final   Special Requests NONE  Final   Culture   Final    NO GROWTH Performed at [facility], [place] [address], [place], [place] [address]    Report Status 04/26/2017 FINAL  Final  MRSA PCR Screening     Status: None   Collection Time: 04/25/17 12:50 PM  Result Value Ref Range Status   MRSA by PCR NEGATIVE NEGATIVE Final    Comment:        The GeneXpert MRSA Assay (FDA approved for NASAL  specimens only), is one component of a comprehensive MRSA colonization surveillance program. It is not intended to diagnose MRSA infection nor to guide or monitor treatment for MRSA infections.   Aerobic/Anaerobic Culture (surgical/deep wound)     Status: None (Preliminary result)   Collection Time: 04/25/17  2:55 PM  Result Value Ref Range Status   Specimen Description ABSCESS  Final   Special Requests Normal  Final   Gram Stain   Final    ABUNDANT WBC PRESENT,BOTH PMN AND MONONUCLEAR ABUNDANT GRAM POSITIVE COCCI IN PAIRS MODERATE GRAM VARIABLE ROD    Culture   Final    ABUNDANT VIRIDANS STREPTOCOCCUS RARE ESCHERICHIA COLI HOLDING FOR POSSIBLE ANAEROBE Performed at [facility], [place] [address] [place], [place] [address]    Report Status PENDING  Incomplete   Organism ID, Bacteria VIRIDANS STREPTOCOCCUS  Final   Organism ID, Bacteria ESCHERICHIA COLI  Final      Susceptibility   Escherichia coli - MIC*    AMPICILLIN 16 INTERMEDIATE Intermediate     CEFAZOLIN <=4 SENSITIVE Sensitive     CEFEPIME <=1 SENSITIVE Sensitive     CEFTAZIDIME <=1 SENSITIVE Sensitive     CEFTRIAXONE <=1 SENSITIVE Sensitive     CIPROFLOXACIN >=4 RESISTANT Resistant     GENTAMICIN <=1 SENSITIVE Sensitive     IMIPENEM <=0.25 SENSITIVE Sensitive     TRIMETH/SULFA <=20 SENSITIVE Sensitive     AMPICILLIN/SULBACTAM 4 SENSITIVE Sensitive     PIP/TAZO <=4 SENSITIVE Sensitive     Extended ESBL NEGATIVE Sensitive     * RARE ESCHERICHIA COLI   Viridans streptococcus - MIC*    PENICILLIN <=0.06 SENSITIVE Sensitive     ERYTHROMYCIN <=0.12 SENSITIVE Sensitive     LEVOFLOXACIN 0.5 SENSITIVE Sensitive     VANCOMYCIN 0.5 SENSITIVE Sensitive     * ABUNDANT VIRIDANS STREPTOCOCCUS    Radiology Reports Dg Chest 2 View  Result Date: 04/24/2017 CLINICAL DATA:  Shortness of breath. All on immunotherapy for lung cancer. Ex-smoker. EXAM: CHEST  2 VIEW COMPARISON:  CT 03/09/2017.  Plain film of 01/09/2017.  FINDINGS: Lateral view degraded by patient arm position. Right Port-A-Cath terminates at the low SVC. Midline trachea. Normal heart size. New small left pleural effusion. Probable treatment effects in the left upper lobe laterally. Clear right lung. Left base subsegmental atelectasis. IMPRESSION: New small left pleural effusion with adjacent left base subsegmental atelectasis. Electronically Signed   By: Abigail Miyamoto M.D.   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 History of palliative radiotherapy of an enlarging left upper lobe pulmonary nodule. Ongoing medical therapy. Patient presents with generalized abdominal pain and clinical concern for pulmonary embolism. EXAM: CT ANGIOGRAPHY CHEST CT ABDOMEN AND PELVIS WITH CONTRAST TECHNIQUE: Multidetector CT imaging of the chest was performed using the standard protocol during bolus administration of intravenous contrast. Multiplanar CT image reconstructions and MIPs were obtained to evaluate the vascular anatomy. Multidetector CT imaging of the abdomen and pelvis was performed using the standard protocol during bolus administration of intravenous contrast. CONTRAST:  < 100 cc > ISOVUE-370 IOPAMIDOL (ISOVUE-370) INJECTION 76% COMPARISON:  03/09/2017 CT chest and abdomen. Chest radiograph from earlier today. 10/15/2016 CT abdomen/pelvis. FINDINGS: CTA CHEST FINDINGS Cardiovascular: The study is moderate quality for the evaluation of pulmonary embolism, with evaluation of the subsegmental vessels limited by motion. There are no filling defects in the central, lobar, segmental or subsegmental pulmonary artery branches to suggest acute pulmonary embolism. Mildly atherosclerotic nonaneurysmal thoracic aorta. Normal caliber pulmonary arteries. Normal heart size. No significant pericardial fluid/thickening. Right internal  jugular MediPort terminates in the right atrium. Mediastinum/Nodes: No discrete thyroid nodules. Unremarkable esophagus. No axillary adenopathy. Newly enlarged 2.0 cm right supraclavicular node (series 7/image 8). Increased right paratracheal adenopathy measuring up to 2.1 cm (series 7/ image 24), previously 1.2 cm. Increased prevascular bilateral mediastinal adenopathy measuring up to 2.0 cm on the right (series 7/ image 26), previously 1.3 cm. Increased 1.9 cm enlarged subcarinal node (series 7/ image 42), previously 1.1 cm. Newly enlarged 1.8 cm AP window node (series 7/ image 35). Newly moderate infiltrative left hilar adenopathy measuring up to 2.0 cm (series 7/ image 48), with associated extrinsic narrowing of segmental lingular and left lower lobe airways. No right hilar adenopathy. Lungs/Pleura: No pneumothorax. No right pleural effusion. New small dependent left pleural effusion. Mild centrilobular emphysema with mild diffuse bronchial wall thickening. Stable bandlike consolidation in the anterior left upper lobe compatible with radiation fibrosis. Stable irregular solid 1.5 cm left upper lobe pulmonary nodule (series 13/image 40). New patchy consolidation, parenchymal banding and nodularity in the posterior left upper lobe and anterior left lower lobe. A few new scattered solid pulmonary nodules in the right lung, largest 6 mm in the anterior right middle lobe (series 13/ image 76). Musculoskeletal: No aggressive appearing focal osseous lesions. Moderate thoracic spondylosis. Review of the MIP images confirms the above findings. CT ABDOMEN and PELVIS FINDINGS Hepatobiliary: New hepatomegaly. Innumerable hypoenhancing masses replacing much of the liver, significantly increased in size and number, for example a 2.0 cm superior liver mass (series 4/ image 15), increased from 0.7 cm. Normal gallbladder with no radiopaque cholelithiasis. No biliary ductal dilatation. Pancreas: Normal, with no mass or duct  dilation. Spleen: Normal size. No mass. Adrenals/Urinary Tract: Newly apparent 1.4 cm left adrenal nodule with density 64 HU. No right adrenal nodule. No hydronephrosis. Subcentimeter hypodense right renal  cortical lesion is too small to characterize and is stable. No new renal lesions. Normal bladder. Stomach/Bowel: Normal non-distended stomach. Normal appendix. Mild sigmoid diverticulosis. There is a gas containing 6.0 x 4.9 x 7.2 cm multilocular abscess in the ventral left lower abdomen (series 4/image 66) with thick enhancing wall, which appears to demonstrate a fistulous connection to the proximal sigmoid colon (series 15/image 70), and which is intimately associated with multiple small bowel loops. No small bowel dilatation. No focal small bowel caliber transition. No definite small bowel wall thickening. Vascular/Lymphatic: Atherosclerotic nonaneurysmal abdominal aorta. Patent portal, splenic, hepatic and renal veins. No pathologically enlarged lymph nodes in the abdomen or pelvis. Reproductive: Status post hysterectomy, with no abnormal findings at the vaginal cuff. No adnexal mass. Other: No ascites. Musculoskeletal: Previously visualized faintly sclerotic lesions throughout the lumbar vertebral bodies are not discretely visualized on today's scan. Mild lumbar spondylosis. Review of the MIP images confirms the above findings. IMPRESSION: 1. No evidence of pulmonary embolism. 2. Complex gas-containing multilocular abscess in the anterior left lower abdomen with fistulous connection to the proximal sigmoid colon (suggesting the sequela of perforated diverticulitis), intimately associated with multiple small bowel loops. No evidence of bowel obstruction. 3. Significant progression of metastatic disease. Right supraclavicular, bilateral mediastinal and left hilar adenopathy is significantly increased. 4. New extrinsic narrowing of segmental left upper and left lower lobe airways by the infiltrative left hilar  adenopathy. New patchy consolidation and nodularity in the posterior left upper lobe and anterior left lower lobe, favor a combination of postobstructive pneumonia and tumor. New scattered probable small pulmonary metastases in the right lung. 5. Liver metastases are significantly increased in size and number with new hepatomegaly. 6. New left adrenal metastasis. 7. New small dependent left pleural effusion. 8. Aortic Atherosclerosis (ICD10-I70.0) and Emphysema (ICD10-J43.9). Electronically Signed   By: Ilona Sorrel M.D.   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 History of palliative radiotherapy of an enlarging left upper lobe pulmonary nodule. Ongoing medical therapy. Patient presents with generalized abdominal pain and clinical concern for pulmonary embolism. EXAM: CT ANGIOGRAPHY CHEST CT ABDOMEN AND PELVIS WITH CONTRAST TECHNIQUE: Multidetector CT imaging of the chest was performed using the standard protocol during bolus administration of intravenous contrast. Multiplanar CT image reconstructions and MIPs were obtained to evaluate the vascular anatomy. Multidetector CT imaging of the abdomen and pelvis was performed using the standard protocol during bolus administration of intravenous contrast. CONTRAST:  < 100 cc > ISOVUE-370 IOPAMIDOL (ISOVUE-370) INJECTION 76% COMPARISON:  03/09/2017 CT chest and abdomen. Chest radiograph from earlier today. 10/15/2016 CT abdomen/pelvis. FINDINGS: CTA CHEST FINDINGS Cardiovascular: The study is moderate quality for the evaluation of pulmonary embolism, with evaluation of the subsegmental vessels limited by motion. There are no filling defects in the central, lobar, segmental or subsegmental pulmonary artery branches to suggest acute pulmonary embolism. Mildly atherosclerotic nonaneurysmal thoracic aorta. Normal caliber pulmonary  arteries. Normal heart size. No significant pericardial fluid/thickening. Right internal jugular MediPort terminates in the right atrium. Mediastinum/Nodes: No discrete thyroid nodules. Unremarkable esophagus. No axillary adenopathy. Newly enlarged 2.0 cm right supraclavicular node (series 7/image 8). Increased right paratracheal adenopathy measuring up to 2.1 cm (series 7/ image 24), previously 1.2 cm. Increased prevascular bilateral mediastinal adenopathy measuring up to 2.0 cm on the right (series 7/ image 26), previously 1.3 cm. Increased 1.9 cm enlarged subcarinal node (series 7/  image 42), previously 1.1 cm. Newly enlarged 1.8 cm AP window node (series 7/ image 35). Newly moderate infiltrative left hilar adenopathy measuring up to 2.0 cm (series 7/ image 48), with associated extrinsic narrowing of segmental lingular and left lower lobe airways. No right hilar adenopathy. Lungs/Pleura: No pneumothorax. No right pleural effusion. New small dependent left pleural effusion. Mild centrilobular emphysema with mild diffuse bronchial wall thickening. Stable bandlike consolidation in the anterior left upper lobe compatible with radiation fibrosis. Stable irregular solid 1.5 cm left upper lobe pulmonary nodule (series 13/image 40). New patchy consolidation, parenchymal banding and nodularity in the posterior left upper lobe and anterior left lower lobe. A few new scattered solid pulmonary nodules in the right lung, largest 6 mm in the anterior right middle lobe (series 13/ image 76). Musculoskeletal: No aggressive appearing focal osseous lesions. Moderate thoracic spondylosis. Review of the MIP images confirms the above findings. CT ABDOMEN and PELVIS FINDINGS Hepatobiliary: New hepatomegaly. Innumerable hypoenhancing masses replacing much of the liver, significantly increased in size and number, for example a 2.0 cm superior liver mass (series 4/ image 15), increased from 0.7 cm. Normal gallbladder with no radiopaque  cholelithiasis. No biliary ductal dilatation. Pancreas: Normal, with no mass or duct dilation. Spleen: Normal size. No mass. Adrenals/Urinary Tract: Newly apparent 1.4 cm left adrenal nodule with density 64 HU. No right adrenal nodule. No hydronephrosis. Subcentimeter hypodense right renal cortical lesion is too small to characterize and is stable. No new renal lesions. Normal bladder. Stomach/Bowel: Normal non-distended stomach. Normal appendix. Mild sigmoid diverticulosis. There is a gas containing 6.0 x 4.9 x 7.2 cm multilocular abscess in the ventral left lower abdomen (series 4/image 66) with thick enhancing wall, which appears to demonstrate a fistulous connection to the proximal sigmoid colon (series 15/image 70), and which is intimately associated with multiple small bowel loops. No small bowel dilatation. No focal small bowel caliber transition. No definite small bowel wall thickening. Vascular/Lymphatic: Atherosclerotic nonaneurysmal abdominal aorta. Patent portal, splenic, hepatic and renal veins. No pathologically enlarged lymph nodes in the abdomen or pelvis. Reproductive: Status post hysterectomy, with no abnormal findings at the vaginal cuff. No adnexal mass. Other: No ascites. Musculoskeletal: Previously visualized faintly sclerotic lesions throughout the lumbar vertebral bodies are not discretely visualized on today's scan. Mild lumbar spondylosis. Review of the MIP images confirms the above findings. IMPRESSION: 1. No evidence of pulmonary embolism. 2. Complex gas-containing multilocular abscess in the anterior left lower abdomen with fistulous connection to the proximal sigmoid colon (suggesting the sequela of perforated diverticulitis), intimately associated with multiple small bowel loops. No evidence of bowel obstruction. 3. Significant progression of metastatic disease. Right supraclavicular, bilateral mediastinal and left hilar adenopathy is significantly increased. 4. New extrinsic narrowing  of segmental left upper and left lower lobe airways by the infiltrative left hilar adenopathy. New patchy consolidation and nodularity in the posterior left upper lobe and anterior left lower lobe, favor a combination of postobstructive pneumonia and tumor. New scattered probable small pulmonary metastases in the right lung. 5. Liver metastases are significantly increased in size and number with new hepatomegaly. 6. New left adrenal metastasis. 7. New small dependent left pleural effusion. 8. Aortic Atherosclerosis (ICD10-I70.0) and Emphysema (ICD10-J43.9). Electronically Signed   By: Ilona Sorrel M.D.   On: 04/24/2017 18:01   Ct Image Guided Drainage By Percutaneous Catheter  Result Date: 04/25/2017 INDICATION: History of metastatic small cell lung cancer with findings worrisome for advanced metastatic disease. Unfortunately, patient has developed a concomitant presumed diverticular  abscess within the ventral aspect of the left lower abdomen. Given poor operative candidacy, request made for CT-guided percutaneous drainage catheter placement for infection source control purposes. EXAM: CT IMAGE GUIDED DRAINAGE BY PERCUTANEOUS CATHETER COMPARISON:  CT abdomen and pelvis - 04/24/2017 MEDICATIONS: The patient is currently admitted to the hospital and receiving intravenous antibiotics. The antibiotics were administered within an appropriate time frame prior to the initiation of the procedure. ANESTHESIA/SEDATION: Moderate (conscious) sedation was employed during this procedure. A total of Versed 2 mg and Fentanyl 100 mcg was administered intravenously. Moderate Sedation Time: 13 minutes. The patient's level of consciousness and vital signs were monitored continuously by radiology nursing throughout the procedure under my direct supervision. CONTRAST:  None COMPLICATIONS: None immediate. PROCEDURE: Informed written consent was obtained from the patient after a discussion of the risks, benefits and alternatives to  treatment. The patient was placed supine on the CT gantry and a pre procedural CT was performed re-demonstrating the known abscess/fluid collection within the ventral aspect of the left lower abdomen/ pelvis with dominant air in fluid containing component measuring approximately 6.8 x 4.7 cm (image 18, series 2). The procedure was planned. A timeout was performed prior to the initiation of the procedure. The skin overlying the ventral aspect of the left lower abdomen was prepped and draped in the usual sterile fashion. The overlying soft tissues were anesthetized with 1% lidocaine with epinephrine. Appropriate trajectory was planned with the use of a 22 gauge spinal needle. An 18 gauge trocar needle was advanced into the abscess/fluid collection and a short Amplatz super stiff wire was coiled within the collection. Appropriate positioning was confirmed with a limited CT scan. The tract was serially dilated allowing placement of a 10 [place] all-purpose drainage catheter. Appropriate positioning was confirmed with a limited postprocedural CT scan. Approximately 80 Ml of purulent fluid was aspirated. The tube was connected to a drainage bag and sutured in place. A dressing was placed. The patient tolerated the procedure well without immediate post procedural complication. IMPRESSION: Successful CT guided placement of a 10 [place] all purpose drain catheter into the presumed diverticular abscess within the ventral aspect the left lower abdomen with aspiration of 80 mL of purulent fluid. Samples were sent to the laboratory as requested by the ordering clinical team. Electronically Signed   By: Sandi Mariscal M.D.   On: 04/25/2017 14:55     CBC Recent Labs  Lab 04/24/17 1544 04/25/17 0459 04/26/17 0449 04/27/17 0443 04/29/17 0451  WBC 19.8* 15.4* 14.2* 11.2* 9.3  HGB 10.2* 8.3* 8.1* 7.7* 7.8*  HCT 29.2* 23.9* 23.3* 22.3* 22.9*  PLT 176 145* 135* 125* 120*  MCV 102.5* 103.9* 105.4* 105.2* 106.0*  MCH 35.8*  36.1* 36.7* 36.3* 36.1*  MCHC 34.9 34.7 34.8 34.5 34.1  RDW 17.6* 18.2* 18.4* 18.2* 18.4*  LYMPHSABS 1.6  --  1.3 1.2  --   MONOABS 0.6  --  0.3 0.2  --   EOSABS 0.0  --  0.1 0.1  --   BASOSABS 0.0  --  0.0 0.0  --     Chemistries  Recent Labs  Lab 04/24/17 1544 04/25/17 0459 04/26/17 0449 04/27/17 0443 04/28/17 0411 04/29/17 0451  NA 132* 134* 135 134*  --  134*  K 3.6 3.7 3.6 3.8  --  3.7  CL 93* 101 102 102  --  101  CO2 25 24 25 25   --  27  GLUCOSE 131* 89 78 72  --  82  BUN 17  16 14 11   --  9  CREATININE 0.91 0.89 0.97 0.89 0.88 0.88  CALCIUM 10.2 9.6 9.6 9.4  --  9.6  AST 290* 247* 258* 234*  --  205*  ALT 46 37 32 31  --  30  ALKPHOS 559* 447* 446* 445*  --  492*  BILITOT 4.7* 4.3* 3.4* 2.8*  --  2.7*   ------------------------------------------------------------------------------------------------------------------ No results for input(s): CHOL, HDL, LDLCALC, TRIG, CHOLHDL, LDLDIRECT in the last 72 hours.  No results found for: HGBA1C ------------------------------------------------------------------------------------------------------------------ No results for input(s): TSH, T4TOTAL, T3FREE, THYROIDAB in the last 72 hours.  Invalid input(s): FREET3 ------------------------------------------------------------------------------------------------------------------ No results for input(s): VITAMINB12, FOLATE, FERRITIN, TIBC, IRON, RETICCTPCT in the last 72 hours.  Coagulation profile Recent Labs  Lab 04/25/17 1027  INR 1.15    No results for input(s): DDIMER in the last 72 hours.  Cardiac Enzymes No results for input(s): CKMB, TROPONINI, MYOGLOBIN in the last 168 hours.  Invalid input(s): CK ------------------------------------------------------------------------------------------------------------------ No results found for: BNP   Roxan Hockey M.D on 04/29/2017 at 4:47 PM  Between 7am to 7pm - Pager - [phone]  After 7pm go to [email or web address] -  password [email or web address]  [facility] -  Office  [phone]   Voice Recognition Viviann Spare dictation system was used to create this note, attempts have been made to correct errors. Please contact the author with questions and/or clarifications.

## 2017-04-29 NOTE — Telephone Encounter (Signed)
Pt in room 1524 [facility] and wants to talk to [place].

## 2017-04-29 NOTE — Progress Notes (Signed)
CC: Abdominal pain  Subjective: Still having abdominal pain, drainage bag is empty and bloody in appearance.  Coughing allot and having a hard time clearing.    Objective: Vital signs in last 24 hours: Temp:  [98.1 F (36.7 C)-98.4 F (36.9 C)] 98.1 F (36.7 C) (01/04 0502) Pulse Rate:  [87-90] 87 (01/04 0502) Resp:  [16-18] 16 (01/04 0502) BP: (101-105)/(55-64) 101/58 (01/04 0502) SpO2:  [94 %-98 %] 95 % (01/04 0818) Last BM Date: 04/26/17 90 PO 1730 IV Urine 1375 25 from the drain Afebrile, VSS WBC down to 9.3 H/H is stable LFT's stable CT scan 04/24/17: Negative for pulmonary embolism.  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 New left adrenal metastases, progressive liver metastasis, new small dependent left pleural effusion, new extrinsic narrowing of the segment of the left upper lobe airways and consolidation in the left upper lobe consistent with a postobstructive pneumonia and tumor.  New scattered small pulmonary metastasis in the right lung.   Intake/Output from previous day: 01/03 0701 - 01/04 0700 In: 1820 [P.O.:90; I.V.:1530; IV Piggyback:200] Out: 1400 [Urine:1375; Drains:25] Intake/Output this shift: Total I/O In: 0  Out: 320 [Urine:300; Drains:20]  General appearance: alert, cooperative, no distress and trying to clear lungs Resp: some course breathsounds, and coughing. GI: soft, complains of ongoing pain lower abdomen.  drainage as above  Lab Results:  Recent Labs    04/27/17 0443 04/29/17 0451  WBC 11.2* 9.3  HGB 7.7* 7.8*  HCT 22.3* 22.9*  PLT 125* 120*    BMET Recent Labs    04/27/17 0443 04/28/17 0411 04/29/17 0451  NA 134*  --  134*  K 3.8  --  3.7  CL 102  --  101  CO2 25  --  27  GLUCOSE 72  --  82  BUN 11  --  9  CREATININE 0.89 0.88 0.88  CALCIUM 9.4  --  9.6    PT/INR No results for input(s): LABPROT, INR in the last 72 hours.  Recent Labs  Lab 04/24/17 1544 04/25/17 0459 04/26/17 0449 04/27/17 0443 04/29/17 0451  AST 290* 247* 258* 234* 205*  ALT 46 37 32 31 30  ALKPHOS 559* 447* 446* 445* 492*  BILITOT 4.7* 4.3* 3.4* 2.8* 2.7*  PROT 6.8 6.3* 5.6* 5.4* 5.7*  ALBUMIN 2.7* 2.4* 2.0* 2.0* 2.1*     Lipase     Component Value Date/Time   LIPASE 20 04/24/2017 1544     Medications: . ferrous sulfate  325 mg Oral Daily  . fluticasone  1 spray Each Nare Daily  . fluticasone furoate-vilanterol  1 puff Inhalation Daily  . ipratropium-albuterol  3 mL Nebulization BID  . magnesium oxide  400 mg Oral QID  . montelukast  10 mg Oral QHS  . potassium chloride  20 mEq Oral BID  . senna  1 tablet Oral Daily  . sodium chloride flush  5 mL Intravenous Q8H   . sodium chloride 50 mL/hr (04/29/17 0045)  . piperacillin-tazobactam (ZOSYN)  IV 3.375 g (04/29/17 0807)   Anti-infectives (From admission, onward)   Start     Dose/Rate Route Frequency Ordered Stop   04/25/17 1800  vancomycin (VANCOCIN) IVPB 1000 mg/200 mL premix     1,000 mg 200 mL/hr over 60 Minutes Intravenous Every 24 hours 04/24/17 2024 04/27/17 2200   04/24/17  2359  piperacillin-tazobactam (ZOSYN) IVPB 3.375 g     3.375 g 12.5 mL/hr over 240 Minutes Intravenous Every 8 hours 04/24/17 2024     04/24/17 1715  piperacillin-tazobactam (ZOSYN) IVPB 3.375 g     3.375 g 100 mL/hr over 30 Minutes Intravenous  Once 04/24/17 1714 04/24/17 1815   04/24/17 1715  vancomycin (VANCOCIN) 1,500 mg in sodium chloride 0.9 % 500 mL IVPB     1,500 mg 250 mL/hr over 120 Minutes Intravenous  Once 04/24/17 1714 04/24/17 2010     Assessment/Plan Diverticulitis with perforation and abscess formation. IR drain placement 04/25/2017  Metastatic small cell lung cancer Possible postoperative pneumonia Transaminitis  FEN:IV fluids/soft diet ID: Zosyn 12/30 =>> day 6 DVT:  SCD's Foley:   None Follow up:  Dr. [facility]    Plan:  She says plans are to repeat CT on Monday.  We will follow with you.  No other surgical recommendations.     LOS: 5 days    Roy Snuffer 04/29/2017 [phone]

## 2017-04-30 LAB — AEROBIC/ANAEROBIC CULTURE W GRAM STAIN (SURGICAL/DEEP WOUND): Special Requests: NORMAL

## 2017-04-30 LAB — AEROBIC/ANAEROBIC CULTURE (SURGICAL/DEEP WOUND)

## 2017-04-30 MED ORDER — SENNA 8.6 MG PO TABS
2.0000 | ORAL_TABLET | Freq: Two times a day (BID) | ORAL | Status: DC
  Administered 2017-04-30 – 2017-05-01 (×2): 17.2 mg via ORAL
  Filled 2017-04-30 (×7): qty 2

## 2017-04-30 MED ORDER — GLYCERIN (LAXATIVE) 2.1 G RE SUPP
1.0000 | RECTAL | Status: DC | PRN
  Administered 2017-04-30: 1 via RECTAL
  Filled 2017-04-30: qty 1

## 2017-04-30 MED ORDER — MIRTAZAPINE 15 MG PO TBDP
7.5000 mg | ORAL_TABLET | Freq: Every day | ORAL | Status: DC
  Administered 2017-04-30 – 2017-05-03 (×4): 7.5 mg via ORAL
  Filled 2017-04-30 (×5): qty 0.5

## 2017-04-30 NOTE — Progress Notes (Signed)
PT Note  Patient Details Name: Sharon [name] MRN: [id number] DOB: 14-Apr-1956     Chart reviewed and spoke with pt. Pt refused at this time to move or get up, stating she just got comfortable and she doesn't want to . She states she has been walking to /from bathroom and that is all she wants to do at the moment. Daughter encouraging patient, however pt still refused. She will allow me to check back on her this afternoon.    Clide Dales 04/30/2017, 12:11 PM  Clide Dales, PT Pager: [phone] 04/30/2017

## 2017-04-30 NOTE — Evaluation (Signed)
Physical Therapy Evaluation Patient Details Name: Sharon [name] MRN: [id number] DOB: 1955/11/13 Today's Date: 04/30/2017   History of Present Illness  Patient is a 62 year old female with past medical history of small cell lung cancer with widespread metastatic disease.  Currently on immunotherapy.  Patient presented to the emergency department with complaints of abdominal pain.  CT chest/abdomen/pelvis done in the emergency department showed perforated diverticulitis with abscess with fistula and progression of cancer. S/p IR image guided drainage on 04/25/17  Clinical Impression  Pt with increased weakness, decreased balance and decreased ability with mobility from baseline. Feel pt will benefit from continued PT to increase ability and safety with increased strength, tolerance to activity and balance.     Follow Up Recommendations SNF    Equipment Recommendations  None recommended by PT    Recommendations for Other Services       Precautions / Restrictions Restrictions Weight Bearing Restrictions: No      Mobility  Bed Mobility Overal bed mobility: Modified Independent                Transfers Overall transfer level: Needs assistance Equipment used: (pushed IV pole) Transfers: Sit to/from Stand Sit to Stand: Min guard         General transfer comment: slightly unsteady upon rising , reached out o hold to something   Ambulation/Gait Ambulation/Gait assistance: Min guard;Min assist Ambulation Distance (Feet): 50 Feet Assistive device: (pushed IV pole) Gait Pattern/deviations: Step-through pattern     General Gait Details: slow, pt with staggered, and weak knees at times and had to stop and reach out for the railing and IV pole. will try RW next time to help steady and support pt next time due to weakness. This short of distance pt had SOB and wheezing , do not feel she could go any further.  Stairs            Wheelchair Mobility    Modified Rankin  (Stroke Patients Only)       Balance Overall balance assessment: Needs assistance Sitting-balance support: No upper extremity supported;Bilateral upper extremity supported Sitting balance-Leahy Scale: Normal     Standing balance support: Bilateral upper extremity supported;During functional activity Standing balance-Leahy Scale: Fair                               Pertinent Vitals/Pain Pain Assessment: 0-10 Pain Score: 3  Pain Location: in right abdomen and right low back quadrant when walking  Pain Descriptors / Indicators: Aching;Discomfort Pain Intervention(s): Limited activity within patient's tolerance;Monitored during session    Home Living Family/patient expects to be discharged to:: Skilled nursing facility                      Prior Function Level of Independence: Independent         Comments: prior to this admission, pt lives in bottom section of her son's home and was independent will all activity, no AD, etc. No STE, however has to walk down and up an incline to get to her level entrance. family concerned with this set up at this time , so feel she needs ST-SNF to increase her ability      Hand Dominance        Extremity/Trunk Assessment        Lower Extremity Assessment Lower Extremity Assessment: Generalized weakness       Communication   Communication: Expressive difficulties(weak, soft voice)  Cognition Arousal/Alertness: Awake/alert Behavior During Therapy: WFL for tasks assessed/performed Overall Cognitive Status: Within Functional Limits for tasks assessed                                        General Comments      Exercises Other Exercises Other Exercises: educated with pursed lip breathing due to dyspnea 3/4 after short walk , and educated with her incentive spirometer and flutter value   Assessment/Plan    PT Assessment Patient needs continued PT services  PT Problem List Decreased  strength;Decreased activity tolerance;Decreased balance;Decreased mobility       PT Treatment Interventions Gait training;DME instruction;Functional mobility training;Therapeutic activities;Therapeutic exercise;Balance training;Patient/family education    PT Goals (Current goals can be found in the Care Plan section)  Acute Rehab PT Goals Patient Stated Goal: I want to get a little stronger, I am really weak PT Goal Formulation: With patient Time For Goal Achievement: 05/14/17 Potential to Achieve Goals: Good    Frequency Min 3X/week   Barriers to discharge Decreased caregiver support      Co-evaluation               AM-PAC PT "6 Clicks" Daily Activity  Outcome Measure Difficulty turning over in bed (including adjusting bedclothes, sheets and blankets)?: None Difficulty moving from lying on back to sitting on the side of the bed? : None Difficulty sitting down on and standing up from a chair with arms (e.g., wheelchair, bedside commode, etc,.)?: A Little Help needed moving to and from a bed to chair (including a wheelchair)?: A Little Help needed walking in hospital room?: A Little Help needed climbing 3-5 steps with a railing? : A Lot 6 Click Score: 19    End of Session Equipment Utilized During Treatment: Oxygen(2 L ) Activity Tolerance: Patient tolerated treatment well;Patient limited by pain;Other (comment)(SOB , dyspnea) Patient left: in bed;with call bell/phone within reach;with family/visitor present(dtr) Nurse Communication: Mobility status PT Visit Diagnosis: Unsteadiness on feet (R26.81);Muscle weakness (generalized) (M62.81)    Time: 3267-1245 PT Time Calculation (min) (ACUTE ONLY): 33 min   Charges:   PT Evaluation $PT Eval Low Complexity: 1 Low PT Treatments $Gait Training: 8-22 mins   PT G Codes:   PT G-Codes **NOT FOR INPATIENT CLASS** Functional Assessment Tool Used: AM-PAC 6 Clicks Basic Mobility    Clide Dales, [place] Pager:  [phone] 04/30/2017   Sharon [name], Gatha Mayer 04/30/2017, 4:42 PM

## 2017-04-30 NOTE — Progress Notes (Signed)
Patient Demographics:    Sharon [name], is a 62 y.o. female, DOB - May 22, 1955, HEN:[id number]  Admit date - 04/24/2017   Admitting Physician Etta Quill, DO  Outpatient Primary MD for the patient is [name], PA  LOS - 6   Chief Complaint  Patient presents with  . 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)   SCLC (small cell lung carcinoma), left ([name])   Transaminitis   Postobstructive pneumonia   History of antineoplastic chemotherapy   Maintenance antineoplastic immunotherapy   Dependence on supplemental oxygen   Liver metastasis ([name])   Abscess of abdominal cavity ([name])   Palliative care encounter   DNR (do not resuscitate)  Brief Narrative: Patient is a 62 year old female with past medical history of small cell lung cancer with widespread metastatic disease.  Currently on immunotherapy.  Patient presented to the emergency department with complaints of abdominal pain.  CT chest/abdomen/pelvis done in the emergency department showed perforated diverticulitis with abscess with fistula and progression of cancer. S/p IR image guided drainage on 04/25/17  1)Acute Diverticulitis of Sigmoid Colon with Perforation and Abscess- Repeat CT abd/pelvis from 04/29/17 resolution of pelvic abscess, plan is for contrast injection into the drain on 05/02/2017 to rule out fistula prior to pulling out the left lower quadrant drain by IR, Remains afebrile,  General Surgery following .  IR performed drainage of the abscess, abscess culture from 04/25/2017 growing strep viridans sensitive and E. coli  both to PCN, discussed with pharmacist, there is a  possibility of an anaerobe as well in the abscess cultures from 04/25/17, c/n  Zosyn   started on 04/24/2017. WBC is down to 9.3 from 19.8 on admission , c/n solid diet  2)New Rt LQ Pain- improved, no BM in > 1 wk, Repeat CT abd/pelvis from 04/29/17 resolution of pelvic abscess, give supp and laxatives,    3)Metastatic small cell lung cancer with worsening disease: Case discussed with Dr. Julien Nordmann (oncologist), and has failed extensive aggressive systemic chemotherapy in the past, as well as palliative radiation therapy, She has H/O extensive stage small cell lung cancer status post 6 cycles of systemic chemotherapy with cisplatin and etoposide followed by prophylactic cranial irradiation followed by palliative radiotherapy to a left upper lobe pulmonary nodule followed by stereotactic radiotherapy to solitary brain metastasis. Her metastatic disease looks to have progressed.    Patient is not very likely to benefit from immunotherapy even if she is able to tolerate it.  Palliative care consult appreciated   4)Possible postoperative pneumonia: respiratory status continues to improve,  continue Zosyn which was started on 04/24/2017  As above in # 1  5)Transaminitis: Cholestatic picture.  Likely secondary to metastatic disease in liver.  We will continue to monitor CMP.She has mild elevation of AST on baseline.  6)Social/Ethics- Palliative consult appreciated, patient is now  a DNR/DNI  7)Anemia- hemoglobin is down to 7.8, patient is largely asymptomatic, without transfusion if symptomatic or if hematocrit is 21 or lower  DVT prophylaxis: SCD Code Status:  DNR/DNI Family Communication:  daughter/son in law Disposition Plan:  SNF rehab with palliative care consult   Consultants: Surgery, oncology, palliative  Procedures: IR drainage of diverticular abscess on 04/25/17   DVT Prophylaxis  : SCDs   Lab Results  Component Value Date   PLT  120 (L) 04/29/2017    Inpatient Medications  Scheduled Meds: . ferrous sulfate  325 mg Oral Daily  . fluticasone  1 spray Each Nare Daily  . fluticasone furoate-vilanterol  1 puff Inhalation Daily  . ipratropium-albuterol  3 mL Nebulization BID  . magnesium oxide  400 mg Oral QID  . mirtazapine  7.5 mg Oral QHS  . montelukast  10 mg Oral QHS  . polyethylene glycol  17 g Oral Daily  . potassium chloride  20 mEq Oral BID  . senna  2 tablet Oral BID  . sodium chloride flush  5 mL Intravenous Q8H   Continuous Infusions: . sodium chloride 50 mL/hr at 04/30/17 1800  . piperacillin-tazobactam (ZOSYN)  IV 3.375 g (04/30/17 1536)   PRN Meds:.acetaminophen **OR** acetaminophen, albuterol, Glycerin (Adult), guaiFENesin, meclizine, morphine injection, ondansetron **OR** ondansetron (ZOFRAN) IV, sodium chloride flush    Anti-infectives (From admission, onward)   Start     Dose/Rate Route Frequency Ordered Stop   04/25/17 1800  vancomycin (VANCOCIN) IVPB 1000 mg/200 mL premix     1,000 mg 200 mL/hr over 60 Minutes Intravenous Every 24 hours 04/24/17 2024 04/27/17 2200   04/24/17 2359  piperacillin-tazobactam (ZOSYN) IVPB 3.375 g     3.375 g 12.5 mL/hr over 240 Minutes Intravenous Every 8 hours 04/24/17 2024     04/24/17 1715  piperacillin-tazobactam (ZOSYN) IVPB 3.375 g     3.375 g 100 mL/hr over 30 Minutes Intravenous  Once 04/24/17 1714 04/24/17 1815   04/24/17 1715  vancomycin (VANCOCIN) 1,500 mg in sodium chloride 0.9 % 500 mL IVPB     1,500 mg 250 mL/hr over 120 Minutes Intravenous  Once 04/24/17 1714 04/24/17 2010        Objective:   Vitals:   04/30/17 0605 04/30/17 0935 04/30/17 1400 04/30/17 1631  BP: (!) 109/59  (!) 100/54   Pulse: 89  85   Resp: 16  18   Temp: 98.4 F (36.9 C)  98.5 F (36.9 C)   TempSrc: Oral  Oral   SpO2: 99% 98% 96% 92%  Weight:      Height:        Wt Readings from Last 3 Encounters:  04/25/17 82.2 kg (181 lb 3.5 oz)  04/13/17 82.6 kg (182  lb)  03/29/17 84.6 kg (186 lb 9.6 oz)     Intake/Output Summary (Last 24 hours) at 04/30/2017 1916 Last data filed at 04/30/2017 1800 Gross per 24 hour  Intake 1500 ml  Output 1460 ml  Net 40 ml     Physical Exam  Gen:- Awake Alert,  In no apparent distress  HEENT:- [place].AT,   Neck-Supple Neck,No JVD,.  Lungs-  CTAB , right subclavian Port-A-Cath site is clean dry and intact CV- S1, S2 normal Abd-  +ve B.Sounds, Abd Soft, right lower quadrant tenderness which is new, left lower quadrant drain with sanguinous drainage Extremity/Skin:- Neg homan's  Psych-affect is appropriate  Data Review:   Micro Results Recent Results (from the past 240 hour(s))  Blood culture (  routine x 2)     Status: None   Collection Time: 04/24/17  5:00 PM  Result Value Ref Range Status   Specimen Description BLOOD CHEST PORTA CATH  Final   Special Requests   Final    BOTTLES DRAWN AEROBIC AND ANAEROBIC Blood Culture adequate volume   Culture   Final    NO GROWTH 5 DAYS Performed at [facility], [address], [place], [place] [address]    Report Status 04/29/2017 FINAL  Final  Blood culture (routine x 2)     Status: None   Collection Time: 04/24/17  5:45 PM  Result Value Ref Range Status   Specimen Description BLOOD CHEST PORTA CATH  Final   Special Requests   Final    BOTTLES DRAWN AEROBIC AND ANAEROBIC Blood Culture results may not be optimal due to an excessive volume of blood received in culture bottles   Culture   Final    NO GROWTH 5 DAYS Performed at [facility], [place] [address] [place], [place] [address]    Report Status 04/29/2017 FINAL  Final  Urine culture     Status: None   Collection Time: 04/24/17  9:18 PM  Result Value Ref Range Status   Specimen Description URINE, CATHETERIZED  Final   Special Requests NONE  Final   Culture   Final    NO GROWTH Performed at [facility], [place] [address] [place], [place] [address]    Report Status 04/26/2017 FINAL  Final    MRSA PCR Screening     Status: None   Collection Time: 04/25/17 12:50 PM  Result Value Ref Range Status   MRSA by PCR NEGATIVE NEGATIVE Final    Comment:        The GeneXpert MRSA Assay (FDA approved for NASAL specimens only), is one component of a comprehensive MRSA colonization surveillance program. It is not intended to diagnose MRSA infection nor to guide or monitor treatment for MRSA infections.   Aerobic/Anaerobic Culture (surgical/deep wound)     Status: None   Collection Time: 04/25/17  2:55 PM  Result Value Ref Range Status   Specimen Description ABSCESS  Final   Special Requests Normal  Final   Gram Stain   Final    ABUNDANT WBC PRESENT,BOTH PMN AND MONONUCLEAR ABUNDANT GRAM POSITIVE COCCI IN PAIRS MODERATE GRAM VARIABLE ROD Performed at [facility], [place] [address], [place],  [address]    Culture   Final    ABUNDANT VIRIDANS STREPTOCOCCUS RARE ESCHERICHIA COLI MIXED ANAEROBIC FLORA PRESENT.  CALL LAB IF FURTHER IID REQUIRED.    Report Status 04/30/2017 FINAL  Final   Organism ID, Bacteria VIRIDANS STREPTOCOCCUS  Final   Organism ID, Bacteria ESCHERICHIA COLI  Final      Susceptibility   Escherichia coli - MIC*    AMPICILLIN 16 INTERMEDIATE Intermediate     CEFAZOLIN <=4 SENSITIVE Sensitive     CEFEPIME <=1 SENSITIVE Sensitive     CEFTAZIDIME <=1 SENSITIVE Sensitive     CEFTRIAXONE <=1 SENSITIVE Sensitive     CIPROFLOXACIN >=4 RESISTANT Resistant     GENTAMICIN <=1 SENSITIVE Sensitive     IMIPENEM <=0.25 SENSITIVE Sensitive     TRIMETH/SULFA <=20 SENSITIVE Sensitive     AMPICILLIN/SULBACTAM 4 SENSITIVE Sensitive     PIP/TAZO <=4 SENSITIVE Sensitive     Extended ESBL NEGATIVE Sensitive     * RARE ESCHERICHIA COLI   Viridans streptococcus - MIC*  PENICILLIN <=0.06 SENSITIVE Sensitive     ERYTHROMYCIN <=0.12 SENSITIVE Sensitive     LEVOFLOXACIN 0.5 SENSITIVE Sensitive     VANCOMYCIN 0.5 SENSITIVE Sensitive     * ABUNDANT VIRIDANS  STREPTOCOCCUS    Radiology Reports Dg Chest 2 View  Result Date: 04/24/2017 CLINICAL DATA:  Shortness of breath. All on immunotherapy for lung cancer. Ex-smoker. EXAM: CHEST  2 VIEW COMPARISON:  CT 03/09/2017.  Plain film of 01/09/2017. FINDINGS: Lateral view degraded by patient arm position. Right Port-A-Cath terminates at the low SVC. Midline trachea. Normal heart size. New small left pleural effusion. Probable treatment effects in the left upper lobe laterally. Clear right lung. Left base subsegmental atelectasis. IMPRESSION: New small left pleural effusion with adjacent left base subsegmental atelectasis. Electronically Signed   By: Abigail Miyamoto M.D.   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 History of palliative radiotherapy of an enlarging left upper lobe pulmonary nodule. Ongoing medical therapy. Patient presents with generalized abdominal pain and clinical concern for pulmonary embolism. EXAM: CT ANGIOGRAPHY CHEST CT ABDOMEN AND PELVIS WITH CONTRAST TECHNIQUE: Multidetector CT imaging of the chest was performed using the standard protocol during bolus administration of intravenous contrast. Multiplanar CT image reconstructions and MIPs were obtained to evaluate the vascular anatomy. Multidetector CT imaging of the abdomen and pelvis was performed using the standard protocol during bolus administration of intravenous contrast. CONTRAST:  < 100 cc > ISOVUE-370 IOPAMIDOL (ISOVUE-370) INJECTION 76% COMPARISON:  03/09/2017 CT chest and abdomen. Chest radiograph from earlier today. 10/15/2016 CT abdomen/pelvis. FINDINGS: CTA CHEST FINDINGS Cardiovascular: The study is moderate quality for the evaluation of pulmonary embolism, with evaluation of the subsegmental vessels limited by motion. There are no filling defects in the central, lobar, segmental or  subsegmental pulmonary artery branches to suggest acute pulmonary embolism. Mildly atherosclerotic nonaneurysmal thoracic aorta. Normal caliber pulmonary arteries. Normal heart size. No significant pericardial fluid/thickening. Right internal jugular MediPort terminates in the right atrium. Mediastinum/Nodes: No discrete thyroid nodules. Unremarkable esophagus. No axillary adenopathy. Newly enlarged 2.0 cm right supraclavicular node (series 7/image 8). Increased right paratracheal adenopathy measuring up to 2.1 cm (series 7/ image 24), previously 1.2 cm. Increased prevascular bilateral mediastinal adenopathy measuring up to 2.0 cm on the right (series 7/ image 26), previously 1.3 cm. Increased 1.9 cm enlarged subcarinal node (series 7/ image 42), previously 1.1 cm. Newly enlarged 1.8 cm AP window node (series 7/ image 35). Newly moderate infiltrative left hilar adenopathy measuring up to 2.0 cm (series 7/ image 48), with associated extrinsic narrowing of segmental lingular and left lower lobe airways. No right hilar adenopathy. Lungs/Pleura: No pneumothorax. No right pleural effusion. New small dependent left pleural effusion. Mild centrilobular emphysema with mild diffuse bronchial wall thickening. Stable bandlike consolidation in the anterior left upper lobe compatible with radiation fibrosis. Stable irregular solid 1.5 cm left upper lobe pulmonary nodule (series 13/image 40). New patchy consolidation, parenchymal banding and nodularity in the posterior left upper lobe and anterior left lower lobe. A few new scattered solid pulmonary nodules in the right lung, largest 6 mm in the anterior right middle lobe (series 13/ image 76). Musculoskeletal: No aggressive appearing focal osseous lesions. Moderate thoracic spondylosis. Review of the MIP images confirms the above findings. CT ABDOMEN and PELVIS FINDINGS Hepatobiliary: New hepatomegaly. Innumerable hypoenhancing masses replacing much of the liver, significantly  increased in  size and number, for example a 2.0 cm superior liver mass (series 4/ image 15), increased from 0.7 cm. Normal gallbladder with no radiopaque cholelithiasis. No biliary ductal dilatation. Pancreas: Normal, with no mass or duct dilation. Spleen: Normal size. No mass. Adrenals/Urinary Tract: Newly apparent 1.4 cm left adrenal nodule with density 64 HU. No right adrenal nodule. No hydronephrosis. Subcentimeter hypodense right renal cortical lesion is too small to characterize and is stable. No new renal lesions. Normal bladder. Stomach/Bowel: Normal non-distended stomach. Normal appendix. Mild sigmoid diverticulosis. There is a gas containing 6.0 x 4.9 x 7.2 cm multilocular abscess in the ventral left lower abdomen (series 4/image 66) with thick enhancing wall, which appears to demonstrate a fistulous connection to the proximal sigmoid colon (series 15/image 70), and which is intimately associated with multiple small bowel loops. No small bowel dilatation. No focal small bowel caliber transition. No definite small bowel wall thickening. Vascular/Lymphatic: Atherosclerotic nonaneurysmal abdominal aorta. Patent portal, splenic, hepatic and renal veins. No pathologically enlarged lymph nodes in the abdomen or pelvis. Reproductive: Status post hysterectomy, with no abnormal findings at the vaginal cuff. No adnexal mass. Other: No ascites. Musculoskeletal: Previously visualized faintly sclerotic lesions throughout the lumbar vertebral bodies are not discretely visualized on today's scan. Mild lumbar spondylosis. Review of the MIP images confirms the above findings. IMPRESSION: 1. No evidence of pulmonary embolism. 2. Complex gas-containing multilocular abscess in the anterior left lower abdomen with fistulous connection to the proximal sigmoid colon (suggesting the sequela of perforated diverticulitis), intimately associated with multiple small bowel loops. No evidence of bowel obstruction. 3. Significant  progression of metastatic disease. Right supraclavicular, bilateral mediastinal and left hilar adenopathy is significantly increased. 4. New extrinsic narrowing of segmental left upper and left lower lobe airways by the infiltrative left hilar adenopathy. New patchy consolidation and nodularity in the posterior left upper lobe and anterior left lower lobe, favor a combination of postobstructive pneumonia and tumor. New scattered probable small pulmonary metastases in the right lung. 5. Liver metastases are significantly increased in size and number with new hepatomegaly. 6. New left adrenal metastasis. 7. New small dependent left pleural effusion. 8. Aortic Atherosclerosis (ICD10-I70.0) and Emphysema (ICD10-J43.9). Electronically Signed   By: Ilona Sorrel M.D.   On: 04/24/2017 18:01   Ct Abdomen Pelvis W Contrast  Result Date: 04/30/2017 CLINICAL DATA:  62 year old female with a history of extensive stage small cell lung carcinoma including brain metastases and a recent diverticular abscess treated with percutaneous drain placement on 04/25/2017. New onset right lower quadrant pain. EXAM: CT ABDOMEN AND PELVIS WITH CONTRAST TECHNIQUE: Multidetector CT imaging of the abdomen and pelvis was performed using the standard protocol following bolus administration of intravenous contrast. CONTRAST:  134mL ISOVUE-300 IOPAMIDOL (ISOVUE-300) INJECTION 61% COMPARISON:  Prior CT scan of the abdomen and pelvis 04/24/2017; CT drain placement 04/25/2017 FINDINGS: Lower chest: Enlarged and now moderate simple layering left pleural effusion with associated left lower lobe atelectasis. New small right-sided pleural effusion also with associated subsegmental atelectasis. The heart remains normal in size. Incompletely imaged port catheter tip in the upper right atrium. No pericardial effusion. Unremarkable distal thoracic esophagus. Hepatobiliary: Innumerable hypoechoic lesions scattered throughout the liver consistent with extensive  metastatic disease. No significant interval change compared to recent prior imaging. Gallbladder is unremarkable. No intra or extrahepatic biliary ductal dilatation. Pancreas: Unremarkable. No pancreatic ductal dilatation or surrounding inflammatory changes. Spleen: Normal. Adrenals/Urinary Tract: Stable left adrenal nodule. Unremarkable right adrenal gland. The kidneys are normal in appearance. No hydronephrosis  or nephrolithiasis. Unremarkable ureters and bladder. Stomach/Bowel: Moderate size rectal stool ball measuring 5 x 4.6 cm. There is a mild thickening of the adjacent rectal wall. Scattered sigmoid diverticula. Decreased inflammation. Small soft tissue tracks continues to extend from the region of the sigmoid colon to the prior abscess cavity. The abscess cavity is completely decompressed. The drainage catheter remains in good position. Normal appendix in a retrocecal position. No evidence of bowel obstruction. Vascular/Lymphatic: Atherosclerotic plaque in the abdominal aorta. No aneurysm or dissection. No significant lymphadenopathy. Reproductive: Status post hysterectomy. No adnexal masses. Other: Small volume perihepatic an intra mesenteric ascites. No loculation or gas to suggest active infection. No abdominal wall hernia. Musculoskeletal: No acute or significant osseous findings. IMPRESSION: 1. Well-positioned drainage catheter with complete resolution of the previously noted diverticular abscess. 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 Recommended drain injection prior to removal. 2. No new abscess or acute intraabdominal abnormality. 3. Moderate volume of formed stool including a 5 cm rectal stool ball suggests constipation. 4. Slightly increased but still minimal abdominal ascites which is likely malignant or related to the diffuse hepatic metastatic disease. 5. New small right  sided pleural effusion and enlarged moderate left-sided pleural effusion. Electronically Signed   By: Jacqulynn Cadet M.D.   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 History of palliative radiotherapy of an enlarging left upper lobe pulmonary nodule. Ongoing medical therapy. Patient presents with generalized abdominal pain and clinical concern for pulmonary embolism. EXAM: CT ANGIOGRAPHY CHEST CT ABDOMEN AND PELVIS WITH CONTRAST TECHNIQUE: Multidetector CT imaging of the chest was performed using the standard protocol during bolus administration of intravenous contrast. Multiplanar CT image reconstructions and MIPs were obtained to evaluate the vascular anatomy. Multidetector CT imaging of the abdomen and pelvis was performed using the standard protocol during bolus administration of intravenous contrast. CONTRAST:  < 100 cc > ISOVUE-370 IOPAMIDOL (ISOVUE-370) INJECTION 76% COMPARISON:  03/09/2017 CT chest and abdomen. Chest radiograph from earlier today. 10/15/2016 CT abdomen/pelvis. FINDINGS: CTA CHEST FINDINGS Cardiovascular: The study is moderate quality for the evaluation of pulmonary embolism, with evaluation of the subsegmental vessels limited by motion. There are no filling defects in the central, lobar, segmental or subsegmental pulmonary artery branches to suggest acute pulmonary embolism. Mildly atherosclerotic nonaneurysmal thoracic aorta. Normal caliber pulmonary arteries. Normal heart size. No significant pericardial fluid/thickening. Right internal jugular MediPort terminates in the right atrium. Mediastinum/Nodes: No discrete thyroid nodules. Unremarkable esophagus. No axillary adenopathy. Newly enlarged 2.0 cm right supraclavicular node (series 7/image 8). Increased right paratracheal adenopathy measuring up to 2.1 cm (series 7/ image 24), previously  1.2 cm. Increased prevascular bilateral mediastinal adenopathy measuring up to 2.0 cm on the right (series 7/ image 26), previously 1.3 cm. Increased 1.9 cm enlarged subcarinal node (series 7/ image 42), previously 1.1 cm. Newly enlarged 1.8 cm AP window node (series 7/ image 35). Newly moderate infiltrative left hilar adenopathy measuring up to 2.0 cm (series 7/ image 48), with associated extrinsic narrowing of segmental lingular and left lower lobe airways. No right hilar adenopathy. Lungs/Pleura: No pneumothorax. No right pleural effusion. New small dependent left pleural effusion. Mild centrilobular emphysema with mild diffuse bronchial wall thickening. Stable bandlike consolidation in the anterior left upper lobe compatible with radiation fibrosis.  Stable irregular solid 1.5 cm left upper lobe pulmonary nodule (series 13/image 40). New patchy consolidation, parenchymal banding and nodularity in the posterior left upper lobe and anterior left lower lobe. A few new scattered solid pulmonary nodules in the right lung, largest 6 mm in the anterior right middle lobe (series 13/ image 76). Musculoskeletal: No aggressive appearing focal osseous lesions. Moderate thoracic spondylosis. Review of the MIP images confirms the above findings. CT ABDOMEN and PELVIS FINDINGS Hepatobiliary: New hepatomegaly. Innumerable hypoenhancing masses replacing much of the liver, significantly increased in size and number, for example a 2.0 cm superior liver mass (series 4/ image 15), increased from 0.7 cm. Normal gallbladder with no radiopaque cholelithiasis. No biliary ductal dilatation. Pancreas: Normal, with no mass or duct dilation. Spleen: Normal size. No mass. Adrenals/Urinary Tract: Newly apparent 1.4 cm left adrenal nodule with density 64 HU. No right adrenal nodule. No hydronephrosis. Subcentimeter hypodense right renal cortical lesion is too small to characterize and is stable. No new renal lesions. Normal bladder.  Stomach/Bowel: Normal non-distended stomach. Normal appendix. Mild sigmoid diverticulosis. There is a gas containing 6.0 x 4.9 x 7.2 cm multilocular abscess in the ventral left lower abdomen (series 4/image 66) with thick enhancing wall, which appears to demonstrate a fistulous connection to the proximal sigmoid colon (series 15/image 70), and which is intimately associated with multiple small bowel loops. No small bowel dilatation. No focal small bowel caliber transition. No definite small bowel wall thickening. Vascular/Lymphatic: Atherosclerotic nonaneurysmal abdominal aorta. Patent portal, splenic, hepatic and renal veins. No pathologically enlarged lymph nodes in the abdomen or pelvis. Reproductive: Status post hysterectomy, with no abnormal findings at the vaginal cuff. No adnexal mass. Other: No ascites. Musculoskeletal: Previously visualized faintly sclerotic lesions throughout the lumbar vertebral bodies are not discretely visualized on today's scan. Mild lumbar spondylosis. Review of the MIP images confirms the above findings. IMPRESSION: 1. No evidence of pulmonary embolism. 2. Complex gas-containing multilocular abscess in the anterior left lower abdomen with fistulous connection to the proximal sigmoid colon (suggesting the sequela of perforated diverticulitis), intimately associated with multiple small bowel loops. No evidence of bowel obstruction. 3. Significant progression of metastatic disease. Right supraclavicular, bilateral mediastinal and left hilar adenopathy is significantly increased. 4. New extrinsic narrowing of segmental left upper and left lower lobe airways by the infiltrative left hilar adenopathy. New patchy consolidation and nodularity in the posterior left upper lobe and anterior left lower lobe, favor a combination of postobstructive pneumonia and tumor. New scattered probable small pulmonary metastases in the right lung. 5. Liver metastases are significantly increased in size and  number with new hepatomegaly. 6. New left adrenal metastasis. 7. New small dependent left pleural effusion. 8. Aortic Atherosclerosis (ICD10-I70.0) and Emphysema (ICD10-J43.9). Electronically Signed   By: Ilona Sorrel M.D.   On: 04/24/2017 18:01   Ct Image Guided Drainage By Percutaneous Catheter  Result Date: 04/25/2017 INDICATION: History of metastatic small cell lung cancer with findings worrisome for advanced metastatic disease. Unfortunately, patient has developed a concomitant presumed diverticular abscess within the ventral aspect of the left lower abdomen. Given poor operative candidacy, request made for CT-guided percutaneous drainage catheter placement for infection source control purposes. EXAM: CT IMAGE GUIDED DRAINAGE BY PERCUTANEOUS CATHETER COMPARISON:  CT abdomen and pelvis - 04/24/2017 MEDICATIONS: The patient is currently admitted to the hospital and receiving intravenous antibiotics. The antibiotics were administered within an appropriate time frame prior to the initiation of the procedure. ANESTHESIA/SEDATION: Moderate (conscious) sedation was employed during this procedure. A  total of Versed 2 mg and Fentanyl 100 mcg was administered intravenously. Moderate Sedation Time: 13 minutes. The patient's level of consciousness and vital signs were monitored continuously by radiology nursing throughout the procedure under my direct supervision. CONTRAST:  None COMPLICATIONS: None immediate. PROCEDURE: Informed written consent was obtained from the patient after a discussion of the risks, benefits and alternatives to treatment. The patient was placed supine on the CT gantry and a pre procedural CT was performed re-demonstrating the known abscess/fluid collection within the ventral aspect of the left lower abdomen/ pelvis with dominant air in fluid containing component measuring approximately 6.8 x 4.7 cm (image 18, series 2). The procedure was planned. A timeout was performed prior to the initiation  of the procedure. The skin overlying the ventral aspect of the left lower abdomen was prepped and draped in the usual sterile fashion. The overlying soft tissues were anesthetized with 1% lidocaine with epinephrine. Appropriate trajectory was planned with the use of a 22 gauge spinal needle. An 18 gauge trocar needle was advanced into the abscess/fluid collection and a short Amplatz super stiff wire was coiled within the collection. Appropriate positioning was confirmed with a limited CT scan. The tract was serially dilated allowing placement of a 10 [place] all-purpose drainage catheter. Appropriate positioning was confirmed with a limited postprocedural CT scan. Approximately 80 Ml of purulent fluid was aspirated. The tube was connected to a drainage bag and sutured in place. A dressing was placed. The patient tolerated the procedure well without immediate post procedural complication. IMPRESSION: Successful CT guided placement of a 10 French all purpose drain catheter into the presumed diverticular abscess within the ventral aspect the left lower abdomen with aspiration of 80 mL of purulent fluid. Samples were sent to the laboratory as requested by the ordering clinical team. Electronically Signed   By: Sandi Mariscal M.D.   On: 04/25/2017 14:55     CBC Recent Labs  Lab 04/24/17 1544 04/25/17 0459 04/26/17 0449 04/27/17 0443 04/29/17 0451  WBC 19.8* 15.4* 14.2* 11.2* 9.3  HGB 10.2* 8.3* 8.1* 7.7* 7.8*  HCT 29.2* 23.9* 23.3* 22.3* 22.9*  PLT 176 145* 135* 125* 120*  MCV 102.5* 103.9* 105.4* 105.2* 106.0*  MCH 35.8* 36.1* 36.7* 36.3* 36.1*  MCHC 34.9 34.7 34.8 34.5 34.1  RDW 17.6* 18.2* 18.4* 18.2* 18.4*  LYMPHSABS 1.6  --  1.3 1.2  --   MONOABS 0.6  --  0.3 0.2  --   EOSABS 0.0  --  0.1 0.1  --   BASOSABS 0.0  --  0.0 0.0  --     Chemistries  Recent Labs  Lab 04/24/17 1544 04/25/17 0459 04/26/17 0449 04/27/17 0443 04/28/17 0411 04/29/17 0451  NA 132* 134* 135 134*  --  134*  K 3.6  3.7 3.6 3.8  --  3.7  CL 93* 101 102 102  --  101  CO2 25 24 25 25   --  27  GLUCOSE 131* 89 78 72  --  82  BUN 17 16 14 11   --  9  CREATININE 0.91 0.89 0.97 0.89 0.88 0.88  CALCIUM 10.2 9.6 9.6 9.4  --  9.6  AST 290* 247* 258* 234*  --  205*  ALT 46 37 32 31  --  30  ALKPHOS 559* 447* 446* 445*  --  492*  BILITOT 4.7* 4.3* 3.4* 2.8*  --  2.7*   ------------------------------------------------------------------------------------------------------------------ No results for input(s): CHOL, HDL, LDLCALC, TRIG, CHOLHDL, LDLDIRECT in the last 72  hours.  No results found for: HGBA1C ------------------------------------------------------------------------------------------------------------------ No results for input(s): TSH, T4TOTAL, T3FREE, THYROIDAB in the last 72 hours.  Invalid input(s): FREET3 ------------------------------------------------------------------------------------------------------------------ No results for input(s): VITAMINB12, FOLATE, FERRITIN, TIBC, IRON, RETICCTPCT in the last 72 hours.  Coagulation profile Recent Labs  Lab 04/25/17 1027  INR 1.15    No results for input(s): DDIMER in the last 72 hours.  Cardiac Enzymes No results for input(s): CKMB, TROPONINI, MYOGLOBIN in the last 168 hours.  Invalid input(s): CK ------------------------------------------------------------------------------------------------------------------ No results found for: BNP   Roxan Hockey M.D on 04/30/2017 at 7:16 PM  Between 7am to 7pm - Pager - [phone]  After 7pm go to [email or web address] - password [email or web address]  [facility] -  Office  [phone]   Voice Recognition Viviann Spare dictation system was used to create this note, attempts have been made to correct errors. Please contact the author with questions and/or clarifications.

## 2017-04-30 NOTE — Progress Notes (Signed)
Patient ID: Sharon [name], female   DOB: 1955-10-18, 62 y.o.   MRN: [id number]    Referring Physician(s): Dr. Michael Boston  Supervising Physician: Jacqulynn Cadet  Patient Status: [facility] - In-pt  Chief Complaint: Diverticular abscess  Subjective: Patient still with some Right sided pain but a little better today.  No BM in over a week.  Some pressure in her rectum.  Allergies: Patient has no known allergies.  Medications: Prior to Admission medications   Medication Sig Start Date End Date Taking? Authorizing Provider  albuterol (PROVENTIL HFA;VENTOLIN HFA) 108 (90 Base) MCG/ACT inhaler Inhale 1 puff into the lungs every 6 (six) hours as needed for wheezing or shortness of breath.   Yes [provider]  budesonide-formoterol (SYMBICORT) 160-4.5 MCG/ACT inhaler Inhale 2 puffs into the lungs 2 (two) times daily. 08/17/16  Yes Javier Glazier, MD  ferrous sulfate 325 (65 FE) MG EC tablet Take 325 mg by mouth daily.   Yes [provider]  fluticasone (FLONASE) 50 MCG/ACT nasal spray Place 1 spray into both nostrils daily.  08/20/16  Yes [provider]  Ipratropium-Albuterol (COMBIVENT RESPIMAT) 20-100 MCG/ACT AERS respimat Inhale 1 puff into the lungs every 6 (six) hours.   Yes [provider]  loperamide (IMODIUM) 2 MG capsule Take by mouth as needed for diarrhea or loose stools.   Yes [provider]  LORazepam (ATIVAN) 0.5 MG tablet 1 tablet po 30 minutes prior to radiation or MRI 09/28/16  Yes Hayden Pedro, PA-C  magnesium oxide (MAG-OX) 400 (241.3 Mg) MG tablet Take 1 tablet (400 mg total) by mouth 4 (four) times daily. 01/26/17  Yes Curcio, Roselie Awkward, NP  meclizine (ANTIVERT) 12.5 MG tablet Take 1-2 tablets (12.5-25 mg total) by mouth 3 (three) times daily as needed for dizziness. 06/05/16  Yes Virgel Manifold, MD  methylPREDNISolone (MEDROL) 4 MG tablet Take 6 tabs on day 1, 5 tabs on day 2, 4 tabs on day 3, 3 tabs on day 4, 2 tabs on  day 5, 1 tab on day 6, and then stop 04/13/17  Yes Curcio, Roselie Awkward, NP  montelukast (SINGULAIR) 10 MG tablet Take 10 mg by mouth at bedtime.  09/06/16  Yes [provider]  ondansetron (ZOFRAN) 8 MG tablet Take 1 tablet (8 mg total) every 8 (eight) hours as needed by mouth for nausea or vomiting. 03/11/17  Yes Curcio, Roselie Awkward, NP  polyethylene glycol powder (GLYCOLAX/MIRALAX) powder Take 1 Container by mouth daily.   Yes [provider]  potassium chloride 20 MEQ/15ML (10%) SOLN Take 15 mLs (20 mEq total) by mouth 2 (two) times daily. 02/25/17  Yes Curcio, Roselie Awkward, NP  prochlorperazine (COMPAZINE) 10 MG tablet TAKE 1 TABLET BY MOUTH EVERY 6 HOURS AS NEEDED FOR NAUSEA AND VOMITING 02/11/17  Yes Curcio, Roselie Awkward, NP  SANTYL ointment APP TOPICALLY UTD 12/08/16  Yes [provider]  senna (SENOKOT) 8.6 MG TABS tablet Take 1 tablet (8.6 mg total) by mouth daily. 10/17/16  Yes Patrecia Pour, MD  sucralfate (CARAFATE) 1 GM/10ML suspension Take 10 mLs (1 g total) by mouth 4 (four) times daily -  with meals and at bedtime. 01/19/17  Yes Gery Pray, MD  temozolomide (TEMODAR) 140 MG capsule Take 2 capsules (280 mg total) daily by mouth. Take for days 1-5 every 28 days. May take on empty stomach & at bedtime to decrease N&V. 03/15/17  Yes Curt Bears, MD  UNABLE TO FIND Take 10 mLs by mouth daily  as needed. Med Name: CBD Oil   Yes [provider]    Vital Signs: BP (!) 109/59 (BP Location: Right Arm)   Pulse 89   Temp 98.4 F (36.9 C) (Oral)   Resp 16   Ht 5\' 7"  (1.702 m)   Wt 181 lb 3.5 oz (82.2 kg)   SpO2 98%   BMI 28.38 kg/m   Physical Exam: Abd: soft, mild tenderness on right side of abdomen.  Drain with essentially no output currently.  Drain site is c/d/i  Imaging: Ct Abdomen Pelvis W Contrast  Result Date: 04/30/2017 CLINICAL DATA:  62 year old female with a history of extensive stage small cell lung carcinoma including brain metastases and a  recent diverticular abscess treated with percutaneous drain placement on 04/25/2017. New onset right lower quadrant pain. EXAM: CT ABDOMEN AND PELVIS WITH CONTRAST TECHNIQUE: Multidetector CT imaging of the abdomen and pelvis was performed using the standard protocol following bolus administration of intravenous contrast. CONTRAST:  145mL ISOVUE-300 IOPAMIDOL (ISOVUE-300) INJECTION 61% COMPARISON:  Prior CT scan of the abdomen and pelvis 04/24/2017; CT drain placement 04/25/2017 FINDINGS: Lower chest: Enlarged and now moderate simple layering left pleural effusion with associated left lower lobe atelectasis. New small right-sided pleural effusion also with associated subsegmental atelectasis. The heart remains normal in size. Incompletely imaged port catheter tip in the upper right atrium. No pericardial effusion. Unremarkable distal thoracic esophagus. Hepatobiliary: Innumerable hypoechoic lesions scattered throughout the liver consistent with extensive metastatic disease. No significant interval change compared to recent prior imaging. Gallbladder is unremarkable. No intra or extrahepatic biliary ductal dilatation. Pancreas: Unremarkable. No pancreatic ductal dilatation or surrounding inflammatory changes. Spleen: Normal. Adrenals/Urinary Tract: Stable left adrenal nodule. Unremarkable right adrenal gland. The kidneys are normal in appearance. No hydronephrosis or nephrolithiasis. Unremarkable ureters and bladder. Stomach/Bowel: Moderate size rectal stool ball measuring 5 x 4.6 cm. There is a mild thickening of the adjacent rectal wall. Scattered sigmoid diverticula. Decreased inflammation. Small soft tissue tracks continues to extend from the region of the sigmoid colon to the prior abscess cavity. The abscess cavity is completely decompressed. The drainage catheter remains in good position. Normal appendix in a retrocecal position. No evidence of bowel obstruction. Vascular/Lymphatic: Atherosclerotic plaque in  the abdominal aorta. No aneurysm or dissection. No significant lymphadenopathy. Reproductive: Status post hysterectomy. No adnexal masses. Other: Small volume perihepatic an intra mesenteric ascites. No loculation or gas to suggest active infection. No abdominal wall hernia. Musculoskeletal: No acute or significant osseous findings. IMPRESSION: 1. Well-positioned drainage catheter with complete resolution of the previously noted diverticular abscess. 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 Recommended drain injection prior to removal. 2. No new abscess or acute intraabdominal abnormality. 3. Moderate volume of formed stool including a 5 cm rectal stool ball suggests constipation. 4. Slightly increased but still minimal abdominal ascites which is likely malignant or related to the diffuse hepatic metastatic disease. 5. New small right sided pleural effusion and enlarged moderate left-sided pleural effusion. Electronically Signed   By: Jacqulynn Cadet M.D.   On: 04/30/2017 08:46    Labs:  CBC: Recent Labs    04/25/17 0459 04/26/17 0449 04/27/17 0443 04/29/17 0451  WBC 15.4* 14.2* 11.2* 9.3  HGB 8.3* 8.1* 7.7* 7.8*  HCT 23.9* 23.3* 22.3* 22.9*  PLT 145* 135* 125* 120*    COAGS: Recent Labs    09/27/16 1611 02/01/17 1254 04/25/17  1027  INR 0.99 0.78 1.15  APTT 27  --   --     BMP: Recent Labs    04/25/17 0459 04/26/17 0449 04/27/17 0443 04/28/17 0411 04/29/17 0451  NA 134* 135 134*  --  134*  K 3.7 3.6 3.8  --  3.7  CL 101 102 102  --  101  CO2 24 25 25   --  27  GLUCOSE 89 78 72  --  82  BUN 16 14 11   --  9  CALCIUM 9.6 9.6 9.4  --  9.6  CREATININE 0.89 0.97 0.89 0.88 0.88  GFRNONAA >60 >60 >60 >60 >60  GFRAA >60 >60 >60 >60 >60    LIVER FUNCTION TESTS: Recent Labs    04/25/17 0459 04/26/17 0449 04/27/17 0443 04/29/17 0451  BILITOT 4.3* 3.4*  2.8* 2.7*  AST 247* 258* 234* 205*  ALT 37 32 31 30  ALKPHOS 447* 446* 445* 492*  PROT 6.3* 5.6* 5.4* 5.7*  ALBUMIN 2.4* 2.0* 2.0* 2.1*    Assessment and Plan: 1. Diverticular abscess, s/p perc drain  CT scan shows significant improvement and resolution of her abscess.  We will plan to inject her drain with contrast on Monday and remove it if there is no evidence of a fistula.  Stool ball noted in rectum.  Agree with glycerin suppository by general surgery.  Electronically Signed: Henreitta Cea 04/30/2017, 10:00 AM   I spent a total of 15 Minutes at the the patient's bedside AND on the patient's hospital floor or unit, greater than 50% of which was counseling/coordinating care for diverticular abscess

## 2017-04-30 NOTE — Progress Notes (Signed)
Patient ID: Oneita Hurt, female   DOB: 1956-03-03, 62 y.o.   MRN: [id number] [facility] Surgery Progress Note:   * No surgery found *  Subjective: Mental status is all things considered pretty good.  Having the pain in the right lower quadrant Objective: Vital signs in last 24 hours: Temp:  [98.4 F (36.9 C)-98.7 F (37.1 C)] 98.4 F (36.9 C) (01/05 0605) Pulse Rate:  [89-111] 89 (01/05 0605) Resp:  [16-18] 16 (01/05 0605) BP: (109-116)/(59-66) 109/59 (01/05 0605) SpO2:  [92 %-99 %] 98 % (01/05 0935)  Intake/Output from previous day: 01/04 0701 - 01/05 0700 In: 1607 [P.O.:140; I.V.:1300; IV Piggyback:50] Out: 1330 [Urine:1000; Drains:30] Intake/Output this shift: Total I/O In: -  Out: 300 [Urine:300]  Physical Exam: Work of breathing is not normal because of her lung cancer.  Right lower quadrant discomfort with drain in the LLQ.  Lab Results:  Results for orders placed or performed during the hospital encounter of 04/24/17 (from the past 48 hour(s))  CBC     Status: Abnormal   Collection Time: 04/29/17  4:51 AM  Result Value Ref Range   WBC 9.3 4.0 - 10.5 K/uL   RBC 2.16 (L) 3.87 - 5.11 MIL/uL   Hemoglobin 7.8 (L) 12.0 - 15.0 g/dL   HCT 22.9 (L) 36.0 - 46.0 %   MCV 106.0 (H) 78.0 - 100.0 fL   MCH 36.1 (H) 26.0 - 34.0 pg   MCHC 34.1 30.0 - 36.0 g/dL   RDW 18.4 (H) 11.5 - 15.5 %   Platelets 120 (L) 150 - 400 K/uL  Comprehensive metabolic panel     Status: Abnormal   Collection Time: 04/29/17  4:51 AM  Result Value Ref Range   Sodium 134 (L) 135 - 145 mmol/L   Potassium 3.7 3.5 - 5.1 mmol/L   Chloride 101 101 - 111 mmol/L   CO2 27 22 - 32 mmol/L   Glucose, Bld 82 65 - 99 mg/dL   BUN 9 6 - 20 mg/dL   Creatinine, Ser 0.88 0.44 - 1.00 mg/dL   Calcium 9.6 8.9 - 10.3 mg/dL   Total Protein 5.7 (L) 6.5 - 8.1 g/dL   Albumin 2.1 (L) 3.5 - 5.0 g/dL   AST 205 (H) 15 - 41 U/L   ALT 30 14 - 54 U/L   Alkaline Phosphatase 492 (H) 38 - 126 U/L   Total Bilirubin 2.7 (H) 0.3  - 1.2 mg/dL   GFR calc non Af Amer >60 >60 mL/min   GFR calc Af Amer >60 >60 mL/min    Comment: (NOTE) The eGFR has been calculated using the CKD EPI equation. This calculation has not been validated in all clinical situations. eGFR's persistently <60 mL/min signify possible Chronic Kidney Disease.    Anion gap 6 5 - 15    Radiology/Results: Ct Abdomen Pelvis W Contrast  Result Date: 04/30/2017 CLINICAL DATA:  62 year old female with a history of extensive stage small cell lung carcinoma including brain metastases and a recent diverticular abscess treated with percutaneous drain placement on 04/25/2017. New onset right lower quadrant pain. EXAM: CT ABDOMEN AND PELVIS WITH CONTRAST TECHNIQUE: Multidetector CT imaging of the abdomen and pelvis was performed using the standard protocol following bolus administration of intravenous contrast. CONTRAST:  184m ISOVUE-300 IOPAMIDOL (ISOVUE-300) INJECTION 61% COMPARISON:  Prior CT scan of the abdomen and pelvis 04/24/2017; CT drain placement 04/25/2017 FINDINGS: Lower chest: Enlarged and now moderate simple layering left pleural effusion with associated left lower lobe atelectasis. New  small right-sided pleural effusion also with associated subsegmental atelectasis. The heart remains normal in size. Incompletely imaged port catheter tip in the upper right atrium. No pericardial effusion. Unremarkable distal thoracic esophagus. Hepatobiliary: Innumerable hypoechoic lesions scattered throughout the liver consistent with extensive metastatic disease. No significant interval change compared to recent prior imaging. Gallbladder is unremarkable. No intra or extrahepatic biliary ductal dilatation. Pancreas: Unremarkable. No pancreatic ductal dilatation or surrounding inflammatory changes. Spleen: Normal. Adrenals/Urinary Tract: Stable left adrenal nodule. Unremarkable right adrenal gland. The kidneys are normal in appearance. No hydronephrosis or nephrolithiasis.  Unremarkable ureters and bladder. Stomach/Bowel: Moderate size rectal stool ball measuring 5 x 4.6 cm. There is a mild thickening of the adjacent rectal wall. Scattered sigmoid diverticula. Decreased inflammation. Small soft tissue tracks continues to extend from the region of the sigmoid colon to the prior abscess cavity. The abscess cavity is completely decompressed. The drainage catheter remains in good position. Normal appendix in a retrocecal position. No evidence of bowel obstruction. Vascular/Lymphatic: Atherosclerotic plaque in the abdominal aorta. No aneurysm or dissection. No significant lymphadenopathy. Reproductive: Status post hysterectomy. No adnexal masses. Other: Small volume perihepatic an intra mesenteric ascites. No loculation or gas to suggest active infection. No abdominal wall hernia. Musculoskeletal: No acute or significant osseous findings. IMPRESSION: 1. Well-positioned drainage catheter with complete resolution of the previously noted diverticular abscess. 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 Recommended drain injection prior to removal. 2. No new abscess or acute intraabdominal abnormality. 3. Moderate volume of formed stool including a 5 cm rectal stool ball suggests constipation. 4. Slightly increased but still minimal abdominal ascites which is likely malignant or related to the diffuse hepatic metastatic disease. 5. New small right sided pleural effusion and enlarged moderate left-sided pleural effusion. Electronically Signed   By: Jacqulynn Cadet M.D.   On: 04/30/2017 08:46    Anti-infectives: Anti-infectives (From admission, onward)   Start     Dose/Rate Route Frequency Ordered Stop   04/25/17 1800  vancomycin (VANCOCIN) IVPB 1000 mg/200 mL premix     1,000 mg 200 mL/hr over 60 Minutes Intravenous Every 24 hours 04/24/17 2024 04/27/17 2200    04/24/17 2359  piperacillin-tazobactam (ZOSYN) IVPB 3.375 g     3.375 g 12.5 mL/hr over 240 Minutes Intravenous Every 8 hours 04/24/17 2024     04/24/17 1715  piperacillin-tazobactam (ZOSYN) IVPB 3.375 g     3.375 g 100 mL/hr over 30 Minutes Intravenous  Once 04/24/17 1714 04/24/17 1815   04/24/17 1715  vancomycin (VANCOCIN) 1,500 mg in sodium chloride 0.9 % 500 mL IVPB     1,500 mg 250 mL/hr over 120 Minutes Intravenous  Once 04/24/17 1714 04/24/17 2010      Assessment/Plan: Problem List: Patient Active Problem List   Diagnosis Date Noted  . DNR (do not resuscitate)   . Palliative care encounter   . History of antineoplastic chemotherapy 04/26/2017  . Maintenance antineoplastic immunotherapy 04/26/2017  . Dependence on supplemental oxygen 04/26/2017  . Liver metastasis ([place]) 04/26/2017  . Abscess of abdominal cavity ([place])   . Diverticulitis of large intestine with perforation and abscess 04/24/2017  . Transaminitis 04/24/2017  . Postobstructive pneumonia 04/24/2017  . Goals of care, counseling/discussion 03/11/2017  . Hypomagnesemia 01/27/2017  . Chemotherapy induced neutropenia ([place]) 12/17/2016  . Hypokalemia 12/17/2016  . Partial small bowel obstruction ([place]) 10/15/2016  . Brain tumor ([place])   .  Seizure ([place]) 09/28/2016  . Brain metastasis ([place]) 09/27/2016  . Hemoptysis 08/17/2016  . Financial difficulties 07/01/2016  . Personal history of breast cancer 03/29/2016  . Encounter for antineoplastic chemotherapy 01/05/2016  . Nausea without vomiting 12/15/2015  . SCLC (small cell lung carcinoma), left ([employer]) 11/18/2015  . Mediastinal adenopathy   . Breast cancer ([place]) 07/18/2012  . 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 Smoking 08/18/2011    CT showed some stool in the recturm-will give glycerin supp and try to stimulate BM.  No other abscess noted.   * No surgery found *    LOS: 6 days   Matt B. Hassell Done, MD, [facility] [phone]  beeper [phone]  04/30/2017 9:56 AM

## 2017-05-01 LAB — BASIC METABOLIC PANEL
ANION GAP: 7 (ref 5–15)
BUN: 7 mg/dL (ref 6–20)
CO2: 26 mmol/L (ref 22–32)
Calcium: 9.3 mg/dL (ref 8.9–10.3)
Chloride: 101 mmol/L (ref 101–111)
Creatinine, Ser: 0.82 mg/dL (ref 0.44–1.00)
GFR calc Af Amer: >60 mL/min (ref >60)
GLUCOSE: 79 mg/dL (ref 65–99)
POTASSIUM: 3.8 mmol/L (ref 3.5–5.1)
SODIUM: 134 mmol/L — AB (ref 135–145)

## 2017-05-01 LAB — CBC
HCT: 23.1 % — ABNORMAL LOW (ref 36.0–46.0)
Hemoglobin: 7.8 g/dL — ABNORMAL LOW (ref 12.0–15.0)
MCH: 36.8 pg — ABNORMAL HIGH (ref 26.0–34.0)
MCHC: 33.8 g/dL (ref 30.0–36.0)
MCV: 109 fL — AB (ref 78.0–100.0)
PLATELETS: 122 10*3/uL — AB (ref 150–400)
RBC: 2.12 MIL/uL — AB (ref 3.87–5.11)
RDW: 18.5 % — ABNORMAL HIGH (ref 11.5–15.5)
WBC: 7.6 10*3/uL (ref 4.0–10.5)

## 2017-05-01 MED ORDER — BISACODYL 10 MG RE SUPP
10.0000 mg | Freq: Once | RECTAL | Status: AC
Start: 2017-05-01 — End: 2017-05-01
  Administered 2017-05-01: 10 mg via RECTAL
  Filled 2017-05-01: qty 1

## 2017-05-01 NOTE — Clinical Social Work Note (Signed)
Clinical Social Work Assessment  Patient Details  Name: DEANA KROCK MRN: [id number] Date of Birth: [birth year]  Date of referral:  05/01/17               Reason for consult:  Facility Placement                Permission sought to share information with:  Chartered certified accountant granted to share information::  Yes, Verbal Permission Granted  Name::     Occupational hygienist::  SNF  Relationship::  son  Contact Information:     Housing/Transportation Living arrangements for the past 2 months:  Single Family Home Source of Information:  Patient Patient Interpreter Needed:  None Criminal Activity/Legal Involvement Pertinent to Current Situation/Hospitalization:  No - Comment as needed Significant Relationships:  Adult Children, Other Family Members Lives with:  Adult Children Do you feel safe going back to the place where you live?  No Need for family participation in patient care:  No (Coment)  Care giving concerns:  Patient from home with family. Pt will need SNF at discharge and is agreeable. Pt has assistance from adult children at home. Pt prefers to be close to home and gave CSW permission to send out to local SNF's to see who can make a bed offer. Pt has experience with SNF.  Social Worker assessment / plan:  CSW will assist with SNF offers and the disposition process.   Employment status:  Disabled (Comment on whether or not currently receiving Disability) Insurance information:  Managed Medicare PT Recommendations:  [place] / Referral to community resources:  [place]  Patient/Family's Response to care:  Patient appreciative of CSW coming to meet to discuss SNF placement and thanked CSW. No other issues or concerns identified.  Patient/Family's Understanding of and Emotional Response to Diagnosis, Current Treatment, and Prognosis:  Patient has understanding of health impairment and treatment needs as she indicated  that she was agreeable to SNF and has experience with it in the past. Pt identifies some help at home however identified and acknowledged her need to SNF placement. Pt indicated that she wants to be close to home. CSW sent to SNF's near home as well as other local area. CSW will continue to f/u for SNF placement and assistance with disposition.  Emotional Assessment Appearance:  Appears stated age Attitude/Demeanor/Rapport:  (Cooperative) Affect (typically observed):  Accepting, Appropriate Orientation:  Oriented to Situation, Oriented to  Time, Oriented to Place, Oriented to Self Alcohol / Substance use:  Not Applicable Psych involvement (Current and /or in the community):  No (Comment)  Discharge Needs  Concerns to be addressed:  Discharge Planning Concerns Readmission within the last 30 days:  No Current discharge risk:  Physical Impairment, Dependent with Mobility Barriers to Discharge:  No Barriers Identified   Normajean Baxter, LCSW 05/01/2017, 1:17 PM

## 2017-05-01 NOTE — NC FL2 (Signed)
[place] LEVEL OF CARE SCREENING TOOL     IDENTIFICATION  Patient Name: Sharon [name] Birthdate: 01/12/1956 Sex: female Admission Date (Current Location): 04/24/2017  [facility] and [place] Number:  Herbalist and Address:  [facility],  [place] [address] [place]      Provider Number: [id number]  Attending Physician Name and Address:  Roxan Hockey, MD  Relative Name and Phone Number:  Kestrel Mis, son, [phone]    Current Level of Care: Hospital Recommended Level of Care: [place] Prior Approval Number:    Date Approved/Denied:   PASRR Number: [id number]  Discharge Plan: SNF    Current Diagnoses: Patient Active Problem List   Diagnosis Date Noted  . DNR (do not resuscitate)   . Palliative care encounter   . History of antineoplastic chemotherapy 04/26/2017  . Maintenance antineoplastic immunotherapy 04/26/2017  . Dependence on supplemental oxygen 04/26/2017  . Liver metastasis ([place]) 04/26/2017  . Abscess of abdominal cavity ([place])   . Diverticulitis of large intestine with perforation and abscess 04/24/2017  . Transaminitis 04/24/2017  . Postobstructive pneumonia 04/24/2017  . Goals of care, counseling/discussion 03/11/2017  . Hypomagnesemia 01/27/2017  . Chemotherapy induced neutropenia ([place]) 12/17/2016  . Hypokalemia 12/17/2016  . Partial small bowel obstruction ([place]) 10/15/2016  . Brain tumor ([place])   . Seizure ([place]) 09/28/2016  . Brain metastasis ([place]) 09/27/2016  . Hemoptysis 08/17/2016  . Financial difficulties 07/01/2016  . Personal history of breast cancer 03/29/2016  . Encounter for antineoplastic chemotherapy 01/05/2016  . Nausea without vomiting 12/15/2015  . 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 Mediastinal adenopathy   . Breast cancer ([place]) 07/18/2012  . 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 Smoking 08/18/2011    Orientation RESPIRATION BLADDER Height & Weight      Self, Time, Situation, Place  O2(2L) Continent Weight: 181 lb 3.5 oz (82.2 kg) Height:  5\' 7"  (170.2 cm)  BEHAVIORAL SYMPTOMS/MOOD NEUROLOGICAL BOWEL NUTRITION STATUS      Continent Diet(soft diet)  AMBULATORY STATUS COMMUNICATION OF NEEDS Skin   Limited Assist Verbally Other (Comment)(drain, left abdomen, placed 12/31, closed)                       Personal Care Assistance Level of Assistance  Bathing, Feeding, Dressing Bathing Assistance: Limited assistance Feeding assistance: Independent Dressing Assistance: Limited assistance     Functional Limitations Info  Sight, Hearing, Speech Sight Info: Adequate Hearing Info: Adequate Speech Info: Adequate    SPECIAL CARE FACTORS FREQUENCY  PT (By licensed PT)     PT Frequency: 5x week              Contractures Contractures Info: Not present    Additional Factors Info  Code Status, Allergies, Psychotropic Code Status Info: full code Allergies Info: nka Psychotropic Info: Remeron         Current Medications (05/01/2017):  This is the current hospital active medication list Current Facility-Administered Medications  Medication Dose Route Frequency Provider Last Rate Last Dose  . 0.9 %  sodium chloride infusion   Intravenous Continuous Michael Boston, MD 50 mL/hr at 04/30/17 1953    . acetaminophen (TYLENOL) tablet 650 mg  650 mg Oral Q6H PRN Etta Quill, DO   650 mg at 04/29/17 1614   Or  . acetaminophen (TYLENOL) suppository 650 mg  650 mg Rectal Q6H PRN Etta Quill, DO      . albuterol (PROVENTIL) (  2.5 MG/3ML) 0.083% nebulizer solution 2.5 mg  2.5 mg Inhalation Q6H PRN Etta Quill, DO   2.5 mg at 04/27/17 1732  . ferrous sulfate tablet 325 mg  325 mg Oral Daily Etta Quill, DO   325 mg at 05/01/17 1057  . fluticasone (FLONASE) 50 MCG/ACT nasal spray 1 spray  1 spray Each Nare Daily Etta Quill, DO   1 spray at 05/01/17 1115  . fluticasone furoate-vilanterol (BREO ELLIPTA) 200-25 MCG/INH  1 puff  1 puff Inhalation Daily Jennette Kettle M, DO   1 puff at 04/30/17 0935  . Glycerin (Adult) 2.1 g suppository 1 suppository  1 suppository Rectal PRN Johnathan Hausen, MD   1 suppository at 04/30/17 1532  . guaiFENesin (MUCINEX) 12 hr tablet 1,200 mg  1,200 mg Oral BID PRN Pershing Proud, NP      . ipratropium-albuterol (DUONEB) 0.5-2.5 (3) MG/3ML nebulizer solution 3 mL  3 mL Nebulization BID Marene Lenz, MD   3 mL at 04/30/17 2208  . magnesium oxide (MAG-OX) tablet 400 mg  400 mg Oral QID Jennette Kettle M, DO   400 mg at 05/01/17 1050  . meclizine (ANTIVERT) tablet 12.5-25 mg  12.5-25 mg Oral TID PRN Etta Quill, DO   25 mg at 04/25/17 0807  . mirtazapine (REMERON SOL-TAB) disintegrating tablet 7.5 mg  7.5 mg Oral QHS Emokpae, Courage, MD   7.5 mg at 04/30/17 2149  . montelukast (SINGULAIR) tablet 10 mg  10 mg Oral QHS Jennette Kettle M, DO   10 mg at 04/30/17 2148  . morphine 2 MG/ML injection 2-4 mg  2-4 mg Intravenous V0J PRN Pershing Proud, NP   2 mg at 05/01/17 0800  . ondansetron (ZOFRAN) tablet 4 mg  4 mg Oral Q6H PRN Etta Quill, DO       Or  . ondansetron [facility]) injection 4 mg  4 mg Intravenous Q6H PRN Etta Quill, DO   4 mg at 04/28/17 1054  . piperacillin-tazobactam (ZOSYN) IVPB 3.375 g  3.375 g Intravenous Q8H Poindexter, Leann T, RPH 12.5 mL/hr at 05/01/17 0755 3.375 g at 05/01/17 0755  . polyethylene glycol (MIRALAX / GLYCOLAX) packet 17 g  17 g Oral Daily Emokpae, Courage, MD   17 g at 05/01/17 1058  . potassium chloride 20 MEQ/15ML (10%) solution 20 mEq  20 mEq Oral BID Jennette Kettle M, DO   20 mEq at 05/01/17 1200  . senna (SENOKOT) tablet 17.2 mg  2 tablet Oral BID Emokpae, Courage, MD   17.2 mg at 05/01/17 1057  . sodium chloride flush (NS) 0.9 % injection 10-40 mL  10-40 mL Intracatheter PRN Etta Quill, DO   10 mL at 04/29/17 0458  . sodium chloride flush (NS) 0.9 % injection 5 mL  5 mL Intravenous Lovena Neighbours, MD   5 mL at 04/30/17  0014     Discharge Medications: Please see discharge summary for a list of discharge medications.  Relevant Imaging Results:  Relevant Lab Results:   Additional Information SS# [id number]  Normajean Baxter, LCSW

## 2017-05-01 NOTE — Progress Notes (Signed)
Patient ID: Sharon [name], female   DOB: [birth year], 62 y.o.   MRN: [id number] [facility] Surgery Progress Note:   * No surgery found *  Subjective: Mental status is clear.  No abdominal complaints today.   Objective: Vital signs in last 24 hours: Temp:  [98 F (36.7 C)-98.5 F (36.9 C)] 98 F (36.7 C) (01/06 0601) Pulse Rate:  [77-95] 77 (01/06 0601) Resp:  [18] 18 (01/06 0601) BP: (100-111)/(53-65) 101/53 (01/06 0601) SpO2:  [92 %-98 %] 95 % (01/06 0601)  Intake/Output from previous day: 01/05 0701 - 01/06 0700 In: 1430 [P.O.:120; I.V.:1200; IV Piggyback:100] Out: 1180 [Urine:1150; Drains:30] Intake/Output this shift: No intake/output data recorded.  Physical Exam: Work of breathing is not labored;  She describes what may be a soft impaction which I have told her to try to manipulate when she is on the toilet.  May need manual disimpaction. Her right lower quadrant pain is better.   Lab Results:  Results for orders placed or performed during the hospital encounter of 04/24/17 (from the past 48 hour(s))  CBC     Status: Abnormal   Collection Time: 05/01/17  9:02 AM  Result Value Ref Range   WBC 7.6 4.0 - 10.5 K/uL   RBC 2.12 (L) 3.87 - 5.11 MIL/uL   Hemoglobin 7.8 (L) 12.0 - 15.0 g/dL   HCT 23.1 (L) 36.0 - 46.0 %   MCV 109.0 (H) 78.0 - 100.0 fL   MCH 36.8 (H) 26.0 - 34.0 pg   MCHC 33.8 30.0 - 36.0 g/dL   RDW 18.5 (H) 11.5 - 15.5 %   Platelets 122 (L) 150 - 400 K/uL  Basic metabolic panel     Status: Abnormal   Collection Time: 05/01/17  9:02 AM  Result Value Ref Range   Sodium 134 (L) 135 - 145 mmol/L   Potassium 3.8 3.5 - 5.1 mmol/L   Chloride 101 101 - 111 mmol/L   CO2 26 22 - 32 mmol/L   Glucose, Bld 79 65 - 99 mg/dL   BUN 7 6 - 20 mg/dL   Creatinine, Ser 0.82 0.44 - 1.00 mg/dL   Calcium 9.3 8.9 - 10.3 mg/dL   GFR calc non Af Amer >60 >60 mL/min   GFR calc Af Amer >60 >60 mL/min    Comment: (NOTE) The eGFR has been calculated using the CKD EPI  equation. This calculation has not been validated in all clinical situations. eGFR's persistently <60 mL/min signify possible Chronic Kidney Disease.    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 New onset right lower quadrant pain. EXAM: CT ABDOMEN AND PELVIS WITH CONTRAST TECHNIQUE: Multidetector CT imaging of the abdomen and pelvis was performed using the standard protocol following bolus administration of intravenous contrast. CONTRAST:  185m ISOVUE-300 IOPAMIDOL (ISOVUE-300) INJECTION 61% COMPARISON:  Prior CT scan of the abdomen and pelvis 04/24/2017; CT drain placement 04/25/2017 FINDINGS: Lower chest: Enlarged and now moderate simple layering left pleural effusion with associated left lower lobe atelectasis. New small right-sided pleural effusion also with associated subsegmental atelectasis. The heart remains normal in size. Incompletely imaged port catheter tip in the upper right atrium. No pericardial effusion. Unremarkable distal thoracic esophagus. Hepatobiliary: Innumerable hypoechoic lesions scattered throughout the liver consistent with extensive metastatic  disease. No significant interval change compared to recent prior imaging. Gallbladder is unremarkable. No intra or extrahepatic biliary ductal dilatation. Pancreas: Unremarkable. No pancreatic ductal dilatation or surrounding inflammatory changes. Spleen: Normal. Adrenals/Urinary Tract: Stable left adrenal nodule. Unremarkable right adrenal gland. The kidneys are normal in appearance. No hydronephrosis or nephrolithiasis. Unremarkable ureters and bladder. Stomach/Bowel: Moderate size rectal stool ball measuring 5 x 4.6 cm. There is a mild thickening of the adjacent rectal  wall. Scattered sigmoid diverticula. Decreased inflammation. Small soft tissue tracks continues to extend from the region of the sigmoid colon to the prior abscess cavity. The abscess cavity is completely decompressed. The drainage catheter remains in good position. Normal appendix in a retrocecal position. No evidence of bowel obstruction. Vascular/Lymphatic: Atherosclerotic plaque in the abdominal aorta. No aneurysm or dissection. No significant lymphadenopathy. Reproductive: Status post hysterectomy. No adnexal masses. Other: Small volume perihepatic an intra mesenteric ascites. No loculation or gas to suggest active infection. No abdominal wall hernia. Musculoskeletal: No acute or significant osseous findings. IMPRESSION: 1. Well-positioned drainage catheter with complete resolution of the previously noted diverticular abscess. 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 Recommended drain injection prior to removal. 2. No new abscess or acute intraabdominal abnormality. 3. Moderate volume of formed stool including a 5 cm rectal stool ball suggests constipation. 4. Slightly increased but still minimal abdominal ascites which is likely malignant or related to the diffuse hepatic metastatic disease. 5. New small right sided pleural effusion and enlarged moderate left-sided pleural effusion. Electronically Signed   By: Jacqulynn Cadet M.D.   On: 04/30/2017 08:46    Anti-infectives: Anti-infectives (From admission, onward)   Start     Dose/Rate Route Frequency Ordered Stop   04/25/17 1800  vancomycin (VANCOCIN) IVPB 1000 mg/200 mL premix     1,000 mg 200 mL/hr over 60 Minutes Intravenous Every 24 hours 04/24/17 2024 04/27/17 2200   04/24/17 2359  piperacillin-tazobactam (ZOSYN) IVPB 3.375 g     3.375 g 12.5 mL/hr over 240 Minutes Intravenous Every 8 hours 04/24/17 2024     04/24/17  1715  piperacillin-tazobactam (ZOSYN) IVPB 3.375 g     3.375 g 100 mL/hr over 30 Minutes Intravenous  Once 04/24/17 1714 04/24/17 1815   04/24/17 1715  vancomycin (VANCOCIN) 1,500 mg in sodium chloride 0.9 % 500 mL IVPB     1,500 mg 250 mL/hr over 120 Minutes Intravenous  Once 04/24/17 1714 04/24/17 2010      Assessment/Plan: Problem List: Patient Active Problem List   Diagnosis Date Noted  . DNR (do not resuscitate)   . Palliative care encounter   . History of antineoplastic chemotherapy 04/26/2017  . Maintenance antineoplastic immunotherapy 04/26/2017  . Dependence on supplemental oxygen 04/26/2017  . Liver metastasis ([place]) 04/26/2017  . Abscess of abdominal cavity ([place])   . Diverticulitis of large intestine with perforation and abscess 04/24/2017  . Transaminitis 04/24/2017  . Postobstructive pneumonia 04/24/2017  . Goals of care, counseling/discussion 03/11/2017  . Hypomagnesemia 01/27/2017  . Chemotherapy induced neutropenia ([place]) 12/17/2016  . Hypokalemia 12/17/2016  . Partial small bowel obstruction ([place]) 10/15/2016  . Brain tumor ([place])   . Seizure ([place]) 09/28/2016  . Brain metastasis ([place]) 09/27/2016  . Hemoptysis 08/17/2016  . Financial difficulties 07/01/2016  . Personal history of breast cancer 03/29/2016  . Encounter for antineoplastic chemotherapy 01/05/2016  . Nausea without vomiting 12/15/2015  . SCLC (small  cell lung carcinoma), left ([place]) 11/18/2015  . Mediastinal adenopathy   . Breast cancer ([place]) 07/18/2012  . 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 Smoking 08/18/2011    Continue observation;  Disimpaction may be necessary.   * No surgery found *    LOS: 7 days   Matt B. Hassell Done, MD, [facility] [phone] beeper [phone]  05/01/2017 9:43 AM

## 2017-05-01 NOTE — Progress Notes (Signed)
Patient Demographics:    Sharon [name], is a 62 y.o. female, DOB - 08-Sep-1955, HCW:[id number]  Admit date - 04/24/2017   Admitting Physician Etta Quill, DO  Outpatient Primary MD for the patient is [name], PA  LOS - 7   Chief Complaint  Patient presents with  . Abdominal Pain        Subjective:    Sharon [name] today has no fevers, no emesis,  No chest pain,   Tolerating solid food well,  Daughter at bedside, RT LQ pain is better,  Had multiple BMs,    Assessment  & Plan :    Principal Problem:   Diverticulitis of large intestine with perforation and abscess Active Problems:   COPD, severe ([name])   SCLC (small cell lung carcinoma), left ([name])   Transaminitis   Postobstructive pneumonia   History of antineoplastic chemotherapy   Maintenance antineoplastic immunotherapy   Dependence on supplemental oxygen   Liver metastasis ([name])   Abscess of abdominal cavity ([name])   Palliative care encounter   DNR (do not resuscitate)  Brief Narrative: Patient is a 62 year old female with past medical history of small cell lung cancer with widespread metastatic disease.  Currently on immunotherapy.  Patient presented to the emergency department with complaints of abdominal pain.  CT chest/abdomen/pelvis done in the emergency department showed perforated diverticulitis with abscess with fistula and progression of cancer. S/p IR image guided drainage on 04/25/17  1)Acute Diverticulitis of Sigmoid Colon with Perforation and Abscess- Repeat CT abd/pelvis from 04/29/17 resolution of pelvic abscess, plan is for contrast injection into the drain on 05/02/2017 to rule out fistula prior to pulling out the left lower quadrant drain by IR, Remains afebrile,  General Surgery following .  IR performed drainage of the abscess, abscess culture from 04/25/2017 growing strep viridans sensitive and E. coli both to PCN,  discussed with pharmacist, there is a  possibility of an anaerobe as well in the abscess cultures from 04/25/17, c/n  Zosyn   started on 04/24/2017. WBC is down to 7.6 from 19.8 on admission , c/n solid diet,   2)New Rt LQ Pain- improved, had multiple  BM , Repeat CT abd/pelvis from 04/29/17 resolution of pelvic abscess, give supp and laxatives,    3)Metastatic small cell lung cancer with worsening disease: Case discussed with Dr. Julien Nordmann (oncologist), and has failed extensive aggressive systemic chemotherapy in the past, as well as palliative radiation therapy, She has H/O extensive stage small cell lung cancer status post 6 cycles of systemic chemotherapy with cisplatin and etoposide followed by prophylactic cranial irradiation followed by palliative radiotherapy to a left upper lobe pulmonary nodule followed by stereotactic radiotherapy to solitary brain metastasis. Her metastatic disease looks to have progressed.    Patient is not very likely to benefit from immunotherapy even if she is able to tolerate it.  Palliative care consult appreciated   4)Possible postoperative pneumonia: respiratory status continues to improve,  continue Zosyn which was started on 04/24/2017  As above in # 1  5)Transaminitis: Cholestatic picture.  Likely secondary to metastatic disease in liver.  We will continue to monitor CMP.She has mild elevation of AST on baseline.  6)Social/Ethics- Palliative consult appreciated, patient is now a DNR/DNI  7)Anemia-  hemoglobin is down to 7.8, patient is largely asymptomatic, without transfusion if symptomatic or if hematocrit is 21 or lower  DVT prophylaxis: SCD Code Status:  DNR/DNI Family Communication:  daughter/son in law Disposition Plan:  SNF rehab with palliative care consult   Consultants: Surgery, oncology, palliative  Procedures: IR drainage of diverticular abscess on 04/25/17   DVT Prophylaxis  : SCDs   Lab Results  Component Value Date   PLT 122 (L)  05/01/2017    Inpatient Medications  Scheduled Meds: . ferrous sulfate  325 mg Oral Daily  . fluticasone  1 spray Each Nare Daily  . fluticasone furoate-vilanterol  1 puff Inhalation Daily  . ipratropium-albuterol  3 mL Nebulization BID  . magnesium oxide  400 mg Oral QID  . mirtazapine  7.5 mg Oral QHS  . montelukast  10 mg Oral QHS  . polyethylene glycol  17 g Oral Daily  . potassium chloride  20 mEq Oral BID  . senna  2 tablet Oral BID  . sodium chloride flush  5 mL Intravenous Q8H   Continuous Infusions: . sodium chloride 50 mL/hr at 04/30/17 1953  . piperacillin-tazobactam (ZOSYN)  IV 3.375 g (05/01/17 1740)   PRN Meds:.acetaminophen **OR** acetaminophen, albuterol, Glycerin (Adult), guaiFENesin, meclizine, morphine injection, ondansetron **OR** ondansetron (ZOFRAN) IV, sodium chloride flush    Anti-infectives (From admission, onward)   Start     Dose/Rate Route Frequency Ordered Stop   04/25/17 1800  vancomycin (VANCOCIN) IVPB 1000 mg/200 mL premix     1,000 mg 200 mL/hr over 60 Minutes Intravenous Every 24 hours 04/24/17 2024 04/27/17 2200   04/24/17 2359  piperacillin-tazobactam (ZOSYN) IVPB 3.375 g     3.375 g 12.5 mL/hr over 240 Minutes Intravenous Every 8 hours 04/24/17 2024     04/24/17 1715  piperacillin-tazobactam (ZOSYN) IVPB 3.375 g     3.375 g 100 mL/hr over 30 Minutes Intravenous  Once 04/24/17 1714 04/24/17 1815   04/24/17 1715  vancomycin (VANCOCIN) 1,500 mg in sodium chloride 0.9 % 500 mL IVPB     1,500 mg 250 mL/hr over 120 Minutes Intravenous  Once 04/24/17 1714 04/24/17 2010        Objective:   Vitals:   04/30/17 2148 04/30/17 2208 05/01/17 0601 05/01/17 1302  BP: 111/65  (!) 101/53 107/66  Pulse: 95  77 (!) 111  Resp: 18  18 20   Temp: 98.3 F (36.8 C)  98 F (36.7 C) 98.5 F (36.9 C)  TempSrc: Oral  Oral Oral  SpO2: 98% 98% 95% 97%  Weight:      Height:        Wt Readings from Last 3 Encounters:  04/25/17 82.2 kg (181 lb 3.5 oz)    04/13/17 82.6 kg (182 lb)  03/29/17 84.6 kg (186 lb 9.6 oz)     Intake/Output Summary (Last 24 hours) at 05/01/2017 1823 Last data filed at 05/01/2017 1742 Gross per 24 hour  Intake 1770 ml  Output 325 ml  Net 1445 ml     Physical Exam  Gen:- Awake Alert,  In no apparent distress  HEENT:- [place].AT,   Neck-Supple Neck,No JVD,.  Lungs-  CTAB , right subclavian Port-A-Cath site is clean dry and intact CV- S1, S2 normal Abd-  +ve B.Sounds, Abd Soft, right lower quadrant tenderness is much improved, left lower quadrant drain with sanguinous drainage Extremity/Skin:- Neg homan's  Psych-affect is appropriate  Data Review:   Micro Results Recent Results (from the past 240 hour(s))  Blood culture (  routine x 2)     Status: None   Collection Time: 04/24/17  5:00 PM  Result Value Ref Range Status   Specimen Description BLOOD CHEST PORTA CATH  Final   Special Requests   Final    BOTTLES DRAWN AEROBIC AND ANAEROBIC Blood Culture adequate volume   Culture   Final    NO GROWTH 5 DAYS Performed at [facility], [address], [place], [place] [address]    Report Status 04/29/2017 FINAL  Final  Blood culture (routine x 2)     Status: None   Collection Time: 04/24/17  5:45 PM  Result Value Ref Range Status   Specimen Description BLOOD CHEST PORTA CATH  Final   Special Requests   Final    BOTTLES DRAWN AEROBIC AND ANAEROBIC Blood Culture results may not be optimal due to an excessive volume of blood received in culture bottles   Culture   Final    NO GROWTH 5 DAYS Performed at [facility], [place] [address] [place], [place] [address]    Report Status 04/29/2017 FINAL  Final  Urine culture     Status: None   Collection Time: 04/24/17  9:18 PM  Result Value Ref Range Status   Specimen Description URINE, CATHETERIZED  Final   Special Requests NONE  Final   Culture   Final    NO GROWTH Performed at [facility], [place] [address], [place], [place] [address]    Report  Status 04/26/2017 FINAL  Final  MRSA PCR Screening     Status: None   Collection Time: 04/25/17 12:50 PM  Result Value Ref Range Status   MRSA by PCR NEGATIVE NEGATIVE Final    Comment:        The GeneXpert MRSA Assay (FDA approved for NASAL specimens only), is one component of a comprehensive MRSA colonization surveillance program. It is not intended to diagnose MRSA infection nor to guide or monitor treatment for MRSA infections.   Aerobic/Anaerobic Culture (surgical/deep wound)     Status: None   Collection Time: 04/25/17  2:55 PM  Result Value Ref Range Status   Specimen Description ABSCESS  Final   Special Requests Normal  Final   Gram Stain   Final    ABUNDANT WBC PRESENT,BOTH PMN AND MONONUCLEAR ABUNDANT GRAM POSITIVE COCCI IN PAIRS MODERATE GRAM VARIABLE ROD Performed at [facility], [place] [address], [place], [place] [address]    Culture   Final    ABUNDANT VIRIDANS STREPTOCOCCUS RARE ESCHERICHIA COLI MIXED ANAEROBIC FLORA PRESENT.  CALL LAB IF FURTHER IID REQUIRED.    Report Status 04/30/2017 FINAL  Final   Organism ID, Bacteria VIRIDANS STREPTOCOCCUS  Final   Organism ID, Bacteria ESCHERICHIA COLI  Final      Susceptibility   Escherichia coli - MIC*    AMPICILLIN 16 INTERMEDIATE Intermediate     CEFAZOLIN <=4 SENSITIVE Sensitive     CEFEPIME <=1 SENSITIVE Sensitive     CEFTAZIDIME <=1 SENSITIVE Sensitive     CEFTRIAXONE <=1 SENSITIVE Sensitive     CIPROFLOXACIN >=4 RESISTANT Resistant     GENTAMICIN <=1 SENSITIVE Sensitive     IMIPENEM <=0.25 SENSITIVE Sensitive     TRIMETH/SULFA <=20 SENSITIVE Sensitive     AMPICILLIN/SULBACTAM 4 SENSITIVE Sensitive     PIP/TAZO <=4 SENSITIVE Sensitive     Extended ESBL NEGATIVE Sensitive     * RARE ESCHERICHIA COLI   Viridans streptococcus - MIC*    PENICILLIN <=  0.06 SENSITIVE Sensitive     ERYTHROMYCIN <=0.12 SENSITIVE Sensitive     LEVOFLOXACIN 0.5 SENSITIVE Sensitive     VANCOMYCIN 0.5 SENSITIVE Sensitive      * ABUNDANT VIRIDANS STREPTOCOCCUS    Radiology Reports Dg Chest 2 View  Result Date: 04/24/2017 CLINICAL DATA:  Shortness of breath. All on immunotherapy for lung cancer. Ex-smoker. EXAM: CHEST  2 VIEW COMPARISON:  CT 03/09/2017.  Plain film of 01/09/2017. FINDINGS: Lateral view degraded by patient arm position. Right Port-A-Cath terminates at the low SVC. Midline trachea. Normal heart size. New small left pleural effusion. Probable treatment effects in the left upper lobe laterally. Clear right lung. Left base subsegmental atelectasis. IMPRESSION: New small left pleural effusion with adjacent left base subsegmental atelectasis. Electronically Signed   By: Abigail Miyamoto M.D.   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 History of palliative radiotherapy of an enlarging left upper lobe pulmonary nodule. Ongoing medical therapy. Patient presents with generalized abdominal pain and clinical concern for pulmonary embolism. EXAM: CT ANGIOGRAPHY CHEST CT ABDOMEN AND PELVIS WITH CONTRAST TECHNIQUE: Multidetector CT imaging of the chest was performed using the standard protocol during bolus administration of intravenous contrast. Multiplanar CT image reconstructions and MIPs were obtained to evaluate the vascular anatomy. Multidetector CT imaging of the abdomen and pelvis was performed using the standard protocol during bolus administration of intravenous contrast. CONTRAST:  < 100 cc > ISOVUE-370 IOPAMIDOL (ISOVUE-370) INJECTION 76% COMPARISON:  03/09/2017 CT chest and abdomen. Chest radiograph from earlier today. 10/15/2016 CT abdomen/pelvis. FINDINGS: CTA CHEST FINDINGS Cardiovascular: The study is moderate quality for the evaluation of pulmonary embolism, with evaluation of the subsegmental vessels limited by motion. There are no filling defects in the central,  lobar, segmental or subsegmental pulmonary artery branches to suggest acute pulmonary embolism. Mildly atherosclerotic nonaneurysmal thoracic aorta. Normal caliber pulmonary arteries. Normal heart size. No significant pericardial fluid/thickening. Right internal jugular MediPort terminates in the right atrium. Mediastinum/Nodes: No discrete thyroid nodules. Unremarkable esophagus. No axillary adenopathy. Newly enlarged 2.0 cm right supraclavicular node (series 7/image 8). Increased right paratracheal adenopathy measuring up to 2.1 cm (series 7/ image 24), previously 1.2 cm. Increased prevascular bilateral mediastinal adenopathy measuring up to 2.0 cm on the right (series 7/ image 26), previously 1.3 cm. Increased 1.9 cm enlarged subcarinal node (series 7/ image 42), previously 1.1 cm. Newly enlarged 1.8 cm AP window node (series 7/ image 35). Newly moderate infiltrative left hilar adenopathy measuring up to 2.0 cm (series 7/ image 48), with associated extrinsic narrowing of segmental lingular and left lower lobe airways. No right hilar adenopathy. Lungs/Pleura: No pneumothorax. No right pleural effusion. New small dependent left pleural effusion. Mild centrilobular emphysema with mild diffuse bronchial wall thickening. Stable bandlike consolidation in the anterior left upper lobe compatible with radiation fibrosis. Stable irregular solid 1.5 cm left upper lobe pulmonary nodule (series 13/image 40). New patchy consolidation, parenchymal banding and nodularity in the posterior left upper lobe and anterior left lower lobe. A few new scattered solid pulmonary nodules in the right lung, largest 6 mm in the anterior right middle lobe (series 13/ image 76). Musculoskeletal: No aggressive appearing focal osseous lesions. Moderate thoracic spondylosis. Review of the MIP images confirms the above findings. CT ABDOMEN and PELVIS FINDINGS Hepatobiliary: New hepatomegaly. Innumerable hypoenhancing masses replacing much of the  liver, significantly increased in size  and number, for example a 2.0 cm superior liver mass (series 4/ image 15), increased from 0.7 cm. Normal gallbladder with no radiopaque cholelithiasis. No biliary ductal dilatation. Pancreas: Normal, with no mass or duct dilation. Spleen: Normal size. No mass. Adrenals/Urinary Tract: Newly apparent 1.4 cm left adrenal nodule with density 64 HU. No right adrenal nodule. No hydronephrosis. Subcentimeter hypodense right renal cortical lesion is too small to characterize and is stable. No new renal lesions. Normal bladder. Stomach/Bowel: Normal non-distended stomach. Normal appendix. Mild sigmoid diverticulosis. There is a gas containing 6.0 x 4.9 x 7.2 cm multilocular abscess in the ventral left lower abdomen (series 4/image 66) with thick enhancing wall, which appears to demonstrate a fistulous connection to the proximal sigmoid colon (series 15/image 70), and which is intimately associated with multiple small bowel loops. No small bowel dilatation. No focal small bowel caliber transition. No definite small bowel wall thickening. Vascular/Lymphatic: Atherosclerotic nonaneurysmal abdominal aorta. Patent portal, splenic, hepatic and renal veins. No pathologically enlarged lymph nodes in the abdomen or pelvis. Reproductive: Status post hysterectomy, with no abnormal findings at the vaginal cuff. No adnexal mass. Other: No ascites. Musculoskeletal: Previously visualized faintly sclerotic lesions throughout the lumbar vertebral bodies are not discretely visualized on today's scan. Mild lumbar spondylosis. Review of the MIP images confirms the above findings. IMPRESSION: 1. No evidence of pulmonary embolism. 2. Complex gas-containing multilocular abscess in the anterior left lower abdomen with fistulous connection to the proximal sigmoid colon (suggesting the sequela of perforated diverticulitis), intimately associated with multiple small bowel loops. No evidence of bowel obstruction.  3. Significant progression of metastatic disease. Right supraclavicular, bilateral mediastinal and left hilar adenopathy is significantly increased. 4. New extrinsic narrowing of segmental left upper and left lower lobe airways by the infiltrative left hilar adenopathy. New patchy consolidation and nodularity in the posterior left upper lobe and anterior left lower lobe, favor a combination of postobstructive pneumonia and tumor. New scattered probable small pulmonary metastases in the right lung. 5. Liver metastases are significantly increased in size and number with new hepatomegaly. 6. New left adrenal metastasis. 7. New small dependent left pleural effusion. 8. Aortic Atherosclerosis (ICD10-I70.0) and Emphysema (ICD10-J43.9). Electronically Signed   By: Ilona Sorrel M.D.   On: 04/24/2017 18:01   Ct Abdomen Pelvis W Contrast  Result Date: 04/30/2017 CLINICAL DATA:  62 year old female with a history of extensive stage small cell lung carcinoma including brain metastases and a recent diverticular abscess treated with percutaneous drain placement on 04/25/2017. New onset right lower quadrant pain. EXAM: CT ABDOMEN AND PELVIS WITH CONTRAST TECHNIQUE: Multidetector CT imaging of the abdomen and pelvis was performed using the standard protocol following bolus administration of intravenous contrast. CONTRAST:  158mL ISOVUE-300 IOPAMIDOL (ISOVUE-300) INJECTION 61% COMPARISON:  Prior CT scan of the abdomen and pelvis 04/24/2017; CT drain placement 04/25/2017 FINDINGS: Lower chest: Enlarged and now moderate simple layering left pleural effusion with associated left lower lobe atelectasis. New small right-sided pleural effusion also with associated subsegmental atelectasis. The heart remains normal in size. Incompletely imaged port catheter tip in the upper right atrium. No pericardial effusion. Unremarkable distal thoracic esophagus. Hepatobiliary: Innumerable hypoechoic lesions scattered throughout the liver consistent  with extensive metastatic disease. No significant interval change compared to recent prior imaging. Gallbladder is unremarkable. No intra or extrahepatic biliary ductal dilatation. Pancreas: Unremarkable. No pancreatic ductal dilatation or surrounding inflammatory changes. Spleen: Normal. Adrenals/Urinary Tract: Stable left adrenal nodule. Unremarkable right adrenal gland. The kidneys are normal in appearance. No hydronephrosis or  nephrolithiasis. Unremarkable ureters and bladder. Stomach/Bowel: Moderate size rectal stool ball measuring 5 x 4.6 cm. There is a mild thickening of the adjacent rectal wall. Scattered sigmoid diverticula. Decreased inflammation. Small soft tissue tracks continues to extend from the region of the sigmoid colon to the prior abscess cavity. The abscess cavity is completely decompressed. The drainage catheter remains in good position. Normal appendix in a retrocecal position. No evidence of bowel obstruction. Vascular/Lymphatic: Atherosclerotic plaque in the abdominal aorta. No aneurysm or dissection. No significant lymphadenopathy. Reproductive: Status post hysterectomy. No adnexal masses. Other: Small volume perihepatic an intra mesenteric ascites. No loculation or gas to suggest active infection. No abdominal wall hernia. Musculoskeletal: No acute or significant osseous findings. IMPRESSION: 1. Well-positioned drainage catheter with complete resolution of the previously noted diverticular abscess. 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 Recommended drain injection prior to removal. 2. No new abscess or acute intraabdominal abnormality. 3. Moderate volume of formed stool including a 5 cm rectal stool ball suggests constipation. 4. Slightly increased but still minimal abdominal ascites which is likely malignant or related to the diffuse hepatic metastatic disease. 5.  New small right sided pleural effusion and enlarged moderate left-sided pleural effusion. Electronically Signed   By: Jacqulynn Cadet M.D.   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 History of palliative radiotherapy of an enlarging left upper lobe pulmonary nodule. Ongoing medical therapy. Patient presents with generalized abdominal pain and clinical concern for pulmonary embolism. EXAM: CT ANGIOGRAPHY CHEST CT ABDOMEN AND PELVIS WITH CONTRAST TECHNIQUE: Multidetector CT imaging of the chest was performed using the standard protocol during bolus administration of intravenous contrast. Multiplanar CT image reconstructions and MIPs were obtained to evaluate the vascular anatomy. Multidetector CT imaging of the abdomen and pelvis was performed using the standard protocol during bolus administration of intravenous contrast. CONTRAST:  < 100 cc > ISOVUE-370 IOPAMIDOL (ISOVUE-370) INJECTION 76% COMPARISON:  03/09/2017 CT chest and abdomen. Chest radiograph from earlier today. 10/15/2016 CT abdomen/pelvis. FINDINGS: CTA CHEST FINDINGS Cardiovascular: The study is moderate quality for the evaluation of pulmonary embolism, with evaluation of the subsegmental vessels limited by motion. There are no filling defects in the central, lobar, segmental or subsegmental pulmonary artery branches to suggest acute pulmonary embolism. Mildly atherosclerotic nonaneurysmal thoracic aorta. Normal caliber pulmonary arteries. Normal heart size. No significant pericardial fluid/thickening. Right internal jugular MediPort terminates in the right atrium. Mediastinum/Nodes: No discrete thyroid nodules. Unremarkable esophagus. No axillary adenopathy. Newly enlarged 2.0 cm right supraclavicular node (series 7/image 8). Increased right paratracheal adenopathy measuring up to 2.1 cm (series 7/ image  24), previously 1.2 cm. Increased prevascular bilateral mediastinal adenopathy measuring up to 2.0 cm on the right (series 7/ image 26), previously 1.3 cm. Increased 1.9 cm enlarged subcarinal node (series 7/ image 42), previously 1.1 cm. Newly enlarged 1.8 cm AP window node (series 7/ image 35). Newly moderate infiltrative left hilar adenopathy measuring up to 2.0 cm (series 7/ image 48), with associated extrinsic narrowing of segmental lingular and left lower lobe airways. No right hilar adenopathy. Lungs/Pleura: No pneumothorax. No right pleural effusion. New small dependent left pleural effusion. Mild centrilobular emphysema with mild diffuse bronchial wall thickening. Stable bandlike consolidation in the anterior left upper lobe compatible with radiation fibrosis. Stable  irregular solid 1.5 cm left upper lobe pulmonary nodule (series 13/image 40). New patchy consolidation, parenchymal banding and nodularity in the posterior left upper lobe and anterior left lower lobe. A few new scattered solid pulmonary nodules in the right lung, largest 6 mm in the anterior right middle lobe (series 13/ image 76). Musculoskeletal: No aggressive appearing focal osseous lesions. Moderate thoracic spondylosis. Review of the MIP images confirms the above findings. CT ABDOMEN and PELVIS FINDINGS Hepatobiliary: New hepatomegaly. Innumerable hypoenhancing masses replacing much of the liver, significantly increased in size and number, for example a 2.0 cm superior liver mass (series 4/ image 15), increased from 0.7 cm. Normal gallbladder with no radiopaque cholelithiasis. No biliary ductal dilatation. Pancreas: Normal, with no mass or duct dilation. Spleen: Normal size. No mass. Adrenals/Urinary Tract: Newly apparent 1.4 cm left adrenal nodule with density 64 HU. No right adrenal nodule. No hydronephrosis. Subcentimeter hypodense right renal cortical lesion is too small to characterize and is stable. No new renal lesions. Normal  bladder. Stomach/Bowel: Normal non-distended stomach. Normal appendix. Mild sigmoid diverticulosis. There is a gas containing 6.0 x 4.9 x 7.2 cm multilocular abscess in the ventral left lower abdomen (series 4/image 66) with thick enhancing wall, which appears to demonstrate a fistulous connection to the proximal sigmoid colon (series 15/image 70), and which is intimately associated with multiple small bowel loops. No small bowel dilatation. No focal small bowel caliber transition. No definite small bowel wall thickening. Vascular/Lymphatic: Atherosclerotic nonaneurysmal abdominal aorta. Patent portal, splenic, hepatic and renal veins. No pathologically enlarged lymph nodes in the abdomen or pelvis. Reproductive: Status post hysterectomy, with no abnormal findings at the vaginal cuff. No adnexal mass. Other: No ascites. Musculoskeletal: Previously visualized faintly sclerotic lesions throughout the lumbar vertebral bodies are not discretely visualized on today's scan. Mild lumbar spondylosis. Review of the MIP images confirms the above findings. IMPRESSION: 1. No evidence of pulmonary embolism. 2. Complex gas-containing multilocular abscess in the anterior left lower abdomen with fistulous connection to the proximal sigmoid colon (suggesting the sequela of perforated diverticulitis), intimately associated with multiple small bowel loops. No evidence of bowel obstruction. 3. Significant progression of metastatic disease. Right supraclavicular, bilateral mediastinal and left hilar adenopathy is significantly increased. 4. New extrinsic narrowing of segmental left upper and left lower lobe airways by the infiltrative left hilar adenopathy. New patchy consolidation and nodularity in the posterior left upper lobe and anterior left lower lobe, favor a combination of postobstructive pneumonia and tumor. New scattered probable small pulmonary metastases in the right lung. 5. Liver metastases are significantly increased in  size and number with new hepatomegaly. 6. New left adrenal metastasis. 7. New small dependent left pleural effusion. 8. Aortic Atherosclerosis (ICD10-I70.0) and Emphysema (ICD10-J43.9). Electronically Signed   By: Ilona Sorrel M.D.   On: 04/24/2017 18:01   Ct Image Guided Drainage By Percutaneous Catheter  Result Date: 04/25/2017 INDICATION: History of metastatic small cell lung cancer with findings worrisome for advanced metastatic disease. Unfortunately, patient has developed a concomitant presumed diverticular abscess within the ventral aspect of the left lower abdomen. Given poor operative candidacy, request made for CT-guided percutaneous drainage catheter placement for infection source control purposes. EXAM: CT IMAGE GUIDED DRAINAGE BY PERCUTANEOUS CATHETER COMPARISON:  CT abdomen and pelvis - 04/24/2017 MEDICATIONS: The patient is currently admitted to the hospital and receiving intravenous antibiotics. The antibiotics were administered within an appropriate time frame prior to the initiation of the procedure. ANESTHESIA/SEDATION: Moderate (conscious) sedation was employed during this procedure. A total  of Versed 2 mg and Fentanyl 100 mcg was administered intravenously. Moderate Sedation Time: 13 minutes. The patient's level of consciousness and vital signs were monitored continuously by radiology nursing throughout the procedure under my direct supervision. CONTRAST:  None COMPLICATIONS: None immediate. PROCEDURE: Informed written consent was obtained from the patient after a discussion of the risks, benefits and alternatives to treatment. The patient was placed supine on the CT gantry and a pre procedural CT was performed re-demonstrating the known abscess/fluid collection within the ventral aspect of the left lower abdomen/ pelvis with dominant air in fluid containing component measuring approximately 6.8 x 4.7 cm (image 18, series 2). The procedure was planned. A timeout was performed prior to the  initiation of the procedure. The skin overlying the ventral aspect of the left lower abdomen was prepped and draped in the usual sterile fashion. The overlying soft tissues were anesthetized with 1% lidocaine with epinephrine. Appropriate trajectory was planned with the use of a 22 gauge spinal needle. An 18 gauge trocar needle was advanced into the abscess/fluid collection and a short Amplatz super stiff wire was coiled within the collection. Appropriate positioning was confirmed with a limited CT scan. The tract was serially dilated allowing placement of a 10 [place] all-purpose drainage catheter. Appropriate positioning was confirmed with a limited postprocedural CT scan. Approximately 80 Ml of purulent fluid was aspirated. The tube was connected to a drainage bag and sutured in place. A dressing was placed. The patient tolerated the procedure well without immediate post procedural complication. IMPRESSION: Successful CT guided placement of a 10 [place] all purpose drain catheter into the presumed diverticular abscess within the ventral aspect the left lower abdomen with aspiration of 80 mL of purulent fluid. Samples were sent to the laboratory as requested by the ordering clinical team. Electronically Signed   By: Sandi Mariscal M.D.   On: 04/25/2017 14:55     CBC Recent Labs  Lab 04/25/17 0459 04/26/17 0449 04/27/17 0443 04/29/17 0451 05/01/17 0902  WBC 15.4* 14.2* 11.2* 9.3 7.6  HGB 8.3* 8.1* 7.7* 7.8* 7.8*  HCT 23.9* 23.3* 22.3* 22.9* 23.1*  PLT 145* 135* 125* 120* 122*  MCV 103.9* 105.4* 105.2* 106.0* 109.0*  MCH 36.1* 36.7* 36.3* 36.1* 36.8*  MCHC 34.7 34.8 34.5 34.1 33.8  RDW 18.2* 18.4* 18.2* 18.4* 18.5*  LYMPHSABS  --  1.3 1.2  --   --   MONOABS  --  0.3 0.2  --   --   EOSABS  --  0.1 0.1  --   --   BASOSABS  --  0.0 0.0  --   --     Chemistries  Recent Labs  Lab 04/25/17 0459 04/26/17 0449 04/27/17 0443 04/28/17 0411 04/29/17 0451 05/01/17 0902  NA 134* 135 134*  --  134*  134*  K 3.7 3.6 3.8  --  3.7 3.8  CL 101 102 102  --  101 101  CO2 24 25 25   --  27 26  GLUCOSE 89 78 72  --  82 79  BUN 16 14 11   --  9 7  CREATININE 0.89 0.97 0.89 0.88 0.88 0.82  CALCIUM 9.6 9.6 9.4  --  9.6 9.3  AST 247* 258* 234*  --  205*  --   ALT 37 32 31  --  30  --   ALKPHOS 447* 446* 445*  --  492*  --   BILITOT 4.3* 3.4* 2.8*  --  2.7*  --    ------------------------------------------------------------------------------------------------------------------  No results for input(s): CHOL, HDL, LDLCALC, TRIG, CHOLHDL, LDLDIRECT in the last 72 hours.  No results found for: HGBA1C ------------------------------------------------------------------------------------------------------------------ No results for input(s): TSH, T4TOTAL, T3FREE, THYROIDAB in the last 72 hours.  Invalid input(s): FREET3 ------------------------------------------------------------------------------------------------------------------ No results for input(s): VITAMINB12, FOLATE, FERRITIN, TIBC, IRON, RETICCTPCT in the last 72 hours.  Coagulation profile Recent Labs  Lab 04/25/17 1027  INR 1.15    No results for input(s): DDIMER in the last 72 hours.  Cardiac Enzymes No results for input(s): CKMB, TROPONINI, MYOGLOBIN in the last 168 hours.  Invalid input(s): CK ------------------------------------------------------------------------------------------------------------------ No results found for: BNP   Roxan Hockey M.D on 05/01/2017 at 6:23 PM  Between 7am to 7pm - Pager - [phone]  After 7pm go to [email or web address] - password [email or web address]  [facility] -  Office  [phone]   Voice Recognition Viviann [employer] dictation system was used to create this note, attempts have been made to correct errors. Please contact the author with questions and/or clarifications.

## 2017-05-02 ENCOUNTER — Encounter (HOSPITAL_COMMUNITY): Payer: Self-pay | Admitting: Interventional Radiology

## 2017-05-02 ENCOUNTER — Inpatient Hospital Stay (HOSPITAL_COMMUNITY): Payer: 59

## 2017-05-02 HISTORY — PX: IR SINUS/FIST TUBE CHK-NON GI: IMG673

## 2017-05-02 MED ORDER — IOPAMIDOL (ISOVUE-300) INJECTION 61%
50.0000 mL | Freq: Once | INTRAVENOUS | Status: AC | PRN
  Administered 2017-05-02: 10 mL

## 2017-05-02 MED ORDER — IOPAMIDOL (ISOVUE-300) INJECTION 61%
INTRAVENOUS | Status: AC
  Administered 2017-05-02: 10 mL
  Filled 2017-05-02: qty 50

## 2017-05-02 NOTE — Procedures (Signed)
Pre procedural Dx: Diverticular Abscess Post procedural Dx: Same  Drain injection demonstrates fistula between decompressed abscess cavity and sigmoid colon.  EBL: None  Complications: None immediate  PLAN: - do NOT flush drain. - Maintain records regarding daily drain out-pt - Repeat drain injection in 2 weeks  Ronny Bacon, MD Pager #: [phone]

## 2017-05-02 NOTE — Progress Notes (Signed)
Patient Demographics:    Sharon [name], is a 62 y.o. female, DOB - 10-10-1955, DDU:[id number]  Admit date - 04/24/2017   Admitting Physician Etta Quill, DO  Outpatient Primary MD for the patient is [name], PA  LOS - 8   Chief Complaint  Patient presents with  . Abdominal Pain        Subjective:    Sharon [name] today has no fevers, no emesis,  No chest pain,   Tolerating solid food well,  Daughter at bedside, RT LQ pain is better,  Had multiple BMs,    Assessment  & Plan :    Principal Problem:   Diverticulitis of large intestine with perforation and abscess Active Problems:   COPD, severe ([name])   SCLC (small cell lung carcinoma), left ([name])   Transaminitis   Postobstructive pneumonia   History of antineoplastic chemotherapy   Maintenance antineoplastic immunotherapy   Dependence on supplemental oxygen   Liver metastasis ([name])   Abscess of abdominal cavity ([name])   Palliative care encounter   DNR (do not resuscitate)  Brief Narrative: Patient is a 61 year old female with past medical history of small cell lung cancer with widespread metastatic disease.  Currently on immunotherapy.  Patient presented to the emergency department with complaints of abdominal pain.  CT chest/abdomen/pelvis done in the emergency department showed perforated diverticulitis with abscess with fistula and progression of cancer. 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 Remains afebrile,  General Surgery following .  IR performed  drainage of the abscess, abscess culture from 04/25/2017 growing strep viridans sensitive and E. coli both to PCN, discussed with pharmacist, there is a  possibility of an anaerobe as well in the abscess cultures from 04/25/17, c/n  Zosyn  started on 04/24/2017. WBC was19.8 on admission , c/n solid diet,   2)New Rt LQ Pain- improved, had multiple  BM , Repeat CT abd/pelvis from 04/29/17 resolution of pelvic abscess,   3)Metastatic small cell lung cancer with worsening disease: Case discussed with Dr. Julien Nordmann (oncologist), and has failed extensive aggressive systemic chemotherapy in the past, as well as palliative radiation therapy, She has H/O extensive stage small cell lung cancer status post 6 cycles of systemic chemotherapy with cisplatin and etoposide followed by prophylactic cranial irradiation followed by palliative radiotherapy to a left upper lobe pulmonary nodule followed by stereotactic radiotherapy to solitary brain metastasis. Her metastatic disease looks to have progressed.    Patient is not very likely to benefit from immunotherapy even if she is able to tolerate it.  Palliative care consult appreciated   4)Possible postoperative pneumonia: respiratory status continues to improve,  continue Zosyn which was started on 04/24/2017  As above in # 1  5)Transaminitis: Cholestatic picture.  Likely secondary to metastatic disease in liver.  We will continue to monitor CMP.She has mild elevation of AST on baseline.  6)Social/Ethics- Palliative consult appreciated, patient is now  a DNR/DNI  7)Anemia- hemoglobin is down to 7.8, patient is largely asymptomatic, without transfusion if symptomatic or if hematocrit is 21 or lower  DVT prophylaxis: SCD Code Status:  DNR/DNI Family Communication:  daughter/son in law Disposition Plan:  SNF rehab with palliative care consult   Consultants: Surgery, oncology, palliative  Procedures: IR drainage of diverticular abscess on 04/25/17   DVT  Prophylaxis  : SCDs   Lab Results  Component Value Date   PLT 122 (L) 05/01/2017    Inpatient Medications  Scheduled Meds: . ferrous sulfate  325 mg Oral Daily  . fluticasone  1 spray Each Nare Daily  . fluticasone furoate-vilanterol  1 puff Inhalation Daily  . ipratropium-albuterol  3 mL Nebulization BID  . magnesium oxide  400 mg Oral QID  . mirtazapine  7.5 mg Oral QHS  . montelukast  10 mg Oral QHS  . polyethylene glycol  17 g Oral Daily  . potassium chloride  20 mEq Oral BID  . senna  2 tablet Oral BID  . sodium chloride flush  5 mL Intravenous Q8H   Continuous Infusions: . sodium chloride 50 mL/hr at 05/02/17 1800  . piperacillin-tazobactam (ZOSYN)  IV 3.375 g (05/02/17 1634)   PRN Meds:.acetaminophen **OR** acetaminophen, albuterol, Glycerin (Adult), guaiFENesin, meclizine, morphine injection, ondansetron **OR** ondansetron (ZOFRAN) IV, sodium chloride flush    Anti-infectives (From admission, onward)   Start     Dose/Rate Route Frequency Ordered Stop   04/25/17 1800  vancomycin (VANCOCIN) IVPB 1000 mg/200 mL premix     1,000 mg 200 mL/hr over 60 Minutes Intravenous Every 24 hours 04/24/17 2024 04/27/17 2200   04/24/17 2359  piperacillin-tazobactam (ZOSYN) IVPB 3.375 g     3.375 g 12.5 mL/hr over 240 Minutes Intravenous Every 8 hours 04/24/17 2024     04/24/17 1715  piperacillin-tazobactam (ZOSYN) IVPB 3.375 g     3.375 g 100 mL/hr over 30 Minutes Intravenous  Once 04/24/17 1714 04/24/17 1815   04/24/17 1715  vancomycin (VANCOCIN) 1,500 mg in sodium chloride 0.9 % 500 mL IVPB     1,500 mg 250 mL/hr over 120 Minutes Intravenous  Once 04/24/17 1714 04/24/17 2010        Objective:   Vitals:   05/01/17 1916 05/01/17 2053 05/02/17 0412 05/02/17 1245  BP:  (!) 100/51 108/61 120/66  Pulse:  (!) 105 90 95  Resp:  18 18 17   Temp:  98.6 F (37 C) 98.2 F (36.8 C) 98.7 F (37.1 C)  TempSrc:  Oral Oral Oral  SpO2: 98% 90% 100% 98%  Weight:      Height:         Wt Readings from Last 3 Encounters:  04/25/17 82.2 kg (181 lb 3.5 oz)  04/13/17 82.6 kg (182 lb)  03/29/17 84.6 kg (186 lb 9.6 oz)     Intake/Output Summary (Last 24 hours) at 05/02/2017 1942 Last data filed at 05/02/2017 1827 Gross per 24 hour  Intake 1435 ml  Output 50 ml  Net 1385 ml    Physical Exam  Gen:- Awake Alert,  In no apparent distress  HEENT:- [place].AT,   Neck-Supple Neck,No JVD,.  Lungs-  CTAB , right subclavian Port-A-Cath site is clean dry and intact CV- S1, S2 normal Abd-  +ve B.Sounds, Abd Soft, right lower quadrant tenderness is much improved, left lower quadrant drain without significant  Drainage/out Extremity/Skin:- Neg homan's  Psych-affect is appropriate  Data Review:   Micro Results Recent Results (from the past 240 hour(s))  Blood culture (routine x 2)     Status: None   Collection Time: 04/24/17  5:00 PM  Result Value Ref Range Status   Specimen Description BLOOD CHEST PORTA CATH  Final   Special Requests   Final    BOTTLES DRAWN AEROBIC AND ANAEROBIC Blood Culture adequate volume   Culture   Final    NO GROWTH 5 DAYS Performed at [facility], [address], [place], [place] [address]    Report Status 04/29/2017 FINAL  Final  Blood culture (routine x 2)     Status: None   Collection Time: 04/24/17  5:45 PM  Result Value Ref Range Status   Specimen Description BLOOD CHEST PORTA CATH  Final   Special Requests   Final    BOTTLES DRAWN AEROBIC AND ANAEROBIC Blood Culture results may not be optimal due to an excessive volume of blood received in culture bottles   Culture   Final    NO GROWTH 5 DAYS Performed at [facility], [place] [address], [place], [place] [address]    Report Status 04/29/2017 FINAL  Final  Urine culture     Status: None   Collection Time: 04/24/17  9:18 PM  Result Value Ref Range Status   Specimen Description URINE, CATHETERIZED  Final   Special Requests NONE  Final   Culture   Final    NO GROWTH Performed  at [facility], [place] [address], [place], [place] [address]    Report Status 04/26/2017 FINAL  Final  MRSA PCR Screening     Status: None   Collection Time: 04/25/17 12:50 PM  Result Value Ref Range Status   MRSA by PCR NEGATIVE NEGATIVE Final    Comment:        The GeneXpert MRSA Assay (FDA approved for NASAL specimens only), is one component of a comprehensive MRSA colonization surveillance program. It is not intended to diagnose MRSA infection nor to guide or monitor treatment for MRSA infections.   Aerobic/Anaerobic Culture (surgical/deep wound)     Status: None   Collection Time: 04/25/17  2:55 PM  Result Value Ref Range Status   Specimen Description ABSCESS  Final   Special Requests Normal  Final   Gram Stain   Final    ABUNDANT WBC PRESENT,BOTH PMN AND MONONUCLEAR ABUNDANT GRAM POSITIVE COCCI IN PAIRS MODERATE GRAM VARIABLE ROD Performed at [facility], [place] [address], [place], [place] [address]    Culture   Final    ABUNDANT VIRIDANS STREPTOCOCCUS RARE ESCHERICHIA COLI MIXED ANAEROBIC FLORA PRESENT.  CALL LAB IF FURTHER IID REQUIRED.    Report Status 04/30/2017 FINAL  Final   Organism ID, Bacteria VIRIDANS STREPTOCOCCUS  Final   Organism ID, Bacteria ESCHERICHIA COLI  Final      Susceptibility   Escherichia coli - MIC*    AMPICILLIN 16 INTERMEDIATE Intermediate     CEFAZOLIN <=4 SENSITIVE Sensitive     CEFEPIME <=1 SENSITIVE Sensitive     CEFTAZIDIME <=1 SENSITIVE Sensitive     CEFTRIAXONE <=1 SENSITIVE Sensitive     CIPROFLOXACIN >=4 RESISTANT Resistant     GENTAMICIN <=1 SENSITIVE Sensitive     IMIPENEM <=0.25 SENSITIVE Sensitive     TRIMETH/SULFA <=20 SENSITIVE Sensitive     AMPICILLIN/SULBACTAM 4 SENSITIVE Sensitive     PIP/TAZO <=4 SENSITIVE Sensitive     Extended ESBL NEGATIVE Sensitive     * RARE ESCHERICHIA COLI   Viridans streptococcus - MIC*  PENICILLIN <=0.06 SENSITIVE Sensitive     ERYTHROMYCIN <=0.12 SENSITIVE Sensitive      LEVOFLOXACIN 0.5 SENSITIVE Sensitive     VANCOMYCIN 0.5 SENSITIVE Sensitive     * ABUNDANT VIRIDANS STREPTOCOCCUS    Radiology Reports Dg Chest 2 View  Result Date: 04/24/2017 CLINICAL DATA:  Shortness of breath. All on immunotherapy for lung cancer. Ex-smoker. EXAM: CHEST  2 VIEW COMPARISON:  CT 03/09/2017.  Plain film of 01/09/2017. FINDINGS: Lateral view degraded by patient arm position. Right Port-A-Cath terminates at the low SVC. Midline trachea. Normal heart size. New small left pleural effusion. Probable treatment effects in the left upper lobe laterally. Clear right lung. Left base subsegmental atelectasis. IMPRESSION: New small left pleural effusion with adjacent left base subsegmental atelectasis. Electronically Signed   By: Abigail Miyamoto M.D.   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 History of palliative radiotherapy of an enlarging left upper lobe pulmonary nodule. Ongoing medical therapy. Patient presents with generalized abdominal pain and clinical concern for pulmonary embolism. EXAM: CT ANGIOGRAPHY CHEST CT ABDOMEN AND PELVIS WITH CONTRAST TECHNIQUE: Multidetector CT imaging of the chest was performed using the standard protocol during bolus administration of intravenous contrast. Multiplanar CT image reconstructions and MIPs were obtained to evaluate the vascular anatomy. Multidetector CT imaging of the abdomen and pelvis was performed using the standard protocol during bolus administration of intravenous contrast. CONTRAST:  < 100 cc > ISOVUE-370 IOPAMIDOL (ISOVUE-370) INJECTION 76% COMPARISON:  03/09/2017 CT chest and abdomen. Chest radiograph from earlier today. 10/15/2016 CT abdomen/pelvis. FINDINGS: CTA CHEST FINDINGS Cardiovascular: The study is moderate quality for the evaluation of pulmonary embolism, with evaluation of the  subsegmental vessels limited by motion. There are no filling defects in the central, lobar, segmental or subsegmental pulmonary artery branches to suggest acute pulmonary embolism. Mildly atherosclerotic nonaneurysmal thoracic aorta. Normal caliber pulmonary arteries. Normal heart size. No significant pericardial fluid/thickening. Right internal jugular MediPort terminates in the right atrium. Mediastinum/Nodes: No discrete thyroid nodules. Unremarkable esophagus. No axillary adenopathy. Newly enlarged 2.0 cm right supraclavicular node (series 7/image 8). Increased right paratracheal adenopathy measuring up to 2.1 cm (series 7/ image 24), previously 1.2 cm. Increased prevascular bilateral mediastinal adenopathy measuring up to 2.0 cm on the right (series 7/ image 26), previously 1.3 cm. Increased 1.9 cm enlarged subcarinal node (series 7/ image 42), previously 1.1 cm. Newly enlarged 1.8 cm AP window node (series 7/ image 35). Newly moderate infiltrative left hilar adenopathy measuring up to 2.0 cm (series 7/ image 48), with associated extrinsic narrowing of segmental lingular and left lower lobe airways. No right hilar adenopathy. Lungs/Pleura: No pneumothorax. No right pleural effusion. New small dependent left pleural effusion. Mild centrilobular emphysema with mild diffuse bronchial wall thickening. Stable bandlike consolidation in the anterior left upper lobe compatible with radiation fibrosis. Stable irregular solid 1.5 cm left upper lobe pulmonary nodule (series 13/image 40). New patchy consolidation, parenchymal banding and nodularity in the posterior left upper lobe and anterior left lower lobe. A few new scattered solid pulmonary nodules in the right lung, largest 6 mm in the anterior right middle lobe (series 13/ image 76). Musculoskeletal: No aggressive appearing focal osseous lesions. Moderate thoracic spondylosis. Review of the MIP images confirms the above findings. CT ABDOMEN and PELVIS FINDINGS  Hepatobiliary: New hepatomegaly. Innumerable hypoenhancing masses replacing much of the liver, significantly increased in  size and number, for example a 2.0 cm superior liver mass (series 4/ image 15), increased from 0.7 cm. Normal gallbladder with no radiopaque cholelithiasis. No biliary ductal dilatation. Pancreas: Normal, with no mass or duct dilation. Spleen: Normal size. No mass. Adrenals/Urinary Tract: Newly apparent 1.4 cm left adrenal nodule with density 64 HU. No right adrenal nodule. No hydronephrosis. Subcentimeter hypodense right renal cortical lesion is too small to characterize and is stable. No new renal lesions. Normal bladder. Stomach/Bowel: Normal non-distended stomach. Normal appendix. Mild sigmoid diverticulosis. There is a gas containing 6.0 x 4.9 x 7.2 cm multilocular abscess in the ventral left lower abdomen (series 4/image 66) with thick enhancing wall, which appears to demonstrate a fistulous connection to the proximal sigmoid colon (series 15/image 70), and which is intimately associated with multiple small bowel loops. No small bowel dilatation. No focal small bowel caliber transition. No definite small bowel wall thickening. Vascular/Lymphatic: Atherosclerotic nonaneurysmal abdominal aorta. Patent portal, splenic, hepatic and renal veins. No pathologically enlarged lymph nodes in the abdomen or pelvis. Reproductive: Status post hysterectomy, with no abnormal findings at the vaginal cuff. No adnexal mass. Other: No ascites. Musculoskeletal: Previously visualized faintly sclerotic lesions throughout the lumbar vertebral bodies are not discretely visualized on today's scan. Mild lumbar spondylosis. Review of the MIP images confirms the above findings. IMPRESSION: 1. No evidence of pulmonary embolism. 2. Complex gas-containing multilocular abscess in the anterior left lower abdomen with fistulous connection to the proximal sigmoid colon (suggesting the sequela of perforated diverticulitis),  intimately associated with multiple small bowel loops. No evidence of bowel obstruction. 3. Significant progression of metastatic disease. Right supraclavicular, bilateral mediastinal and left hilar adenopathy is significantly increased. 4. New extrinsic narrowing of segmental left upper and left lower lobe airways by the infiltrative left hilar adenopathy. New patchy consolidation and nodularity in the posterior left upper lobe and anterior left lower lobe, favor a combination of postobstructive pneumonia and tumor. New scattered probable small pulmonary metastases in the right lung. 5. Liver metastases are significantly increased in size and number with new hepatomegaly. 6. New left adrenal metastasis. 7. New small dependent left pleural effusion. 8. Aortic Atherosclerosis (ICD10-I70.0) and Emphysema (ICD10-J43.9). Electronically Signed   By: Ilona Sorrel M.D.   On: 04/24/2017 18:01   Ct Abdomen Pelvis W Contrast  Result Date: 04/30/2017 CLINICAL DATA:  62 year old female with a history of extensive stage small cell lung carcinoma including brain metastases and a recent diverticular abscess treated with percutaneous drain placement on 04/25/2017. New onset right lower quadrant pain. EXAM: CT ABDOMEN AND PELVIS WITH CONTRAST TECHNIQUE: Multidetector CT imaging of the abdomen and pelvis was performed using the standard protocol following bolus administration of intravenous contrast. CONTRAST:  168mL ISOVUE-300 IOPAMIDOL (ISOVUE-300) INJECTION 61% COMPARISON:  Prior CT scan of the abdomen and pelvis 04/24/2017; CT drain placement 04/25/2017 FINDINGS: Lower chest: Enlarged and now moderate simple layering left pleural effusion with associated left lower lobe atelectasis. New small right-sided pleural effusion also with associated subsegmental atelectasis. The heart remains normal in size. Incompletely imaged port catheter tip in the upper right atrium. No pericardial effusion. Unremarkable distal thoracic  esophagus. Hepatobiliary: Innumerable hypoechoic lesions scattered throughout the liver consistent with extensive metastatic disease. No significant interval change compared to recent prior imaging. Gallbladder is unremarkable. No intra or extrahepatic biliary ductal dilatation. Pancreas: Unremarkable. No pancreatic ductal dilatation or surrounding inflammatory changes. Spleen: Normal. Adrenals/Urinary Tract: Stable left adrenal nodule. Unremarkable right adrenal gland. The kidneys are normal in appearance. No hydronephrosis  or nephrolithiasis. Unremarkable ureters and bladder. Stomach/Bowel: Moderate size rectal stool ball measuring 5 x 4.6 cm. There is a mild thickening of the adjacent rectal wall. Scattered sigmoid diverticula. Decreased inflammation. Small soft tissue tracks continues to extend from the region of the sigmoid colon to the prior abscess cavity. The abscess cavity is completely decompressed. The drainage catheter remains in good position. Normal appendix in a retrocecal position. No evidence of bowel obstruction. Vascular/Lymphatic: Atherosclerotic plaque in the abdominal aorta. No aneurysm or dissection. No significant lymphadenopathy. Reproductive: Status post hysterectomy. No adnexal masses. Other: Small volume perihepatic an intra mesenteric ascites. No loculation or gas to suggest active infection. No abdominal wall hernia. Musculoskeletal: No acute or significant osseous findings. IMPRESSION: 1. Well-positioned drainage catheter with complete resolution of the previously noted diverticular abscess. 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 Recommended drain injection prior to removal. 2. No new abscess or acute intraabdominal abnormality. 3. Moderate volume of formed stool including a 5 cm rectal stool ball suggests constipation. 4. Slightly increased but still minimal  abdominal ascites which is likely malignant or related to the diffuse hepatic metastatic disease. 5. New small right sided pleural effusion and enlarged moderate left-sided pleural effusion. Electronically Signed   By: Jacqulynn Cadet M.D.   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 History of palliative radiotherapy of an enlarging left upper lobe pulmonary nodule. Ongoing medical therapy. Patient presents with generalized abdominal pain and clinical concern for pulmonary embolism. EXAM: CT ANGIOGRAPHY CHEST CT ABDOMEN AND PELVIS WITH CONTRAST TECHNIQUE: Multidetector CT imaging of the chest was performed using the standard protocol during bolus administration of intravenous contrast. Multiplanar CT image reconstructions and MIPs were obtained to evaluate the vascular anatomy. Multidetector CT imaging of the abdomen and pelvis was performed using the standard protocol during bolus administration of intravenous contrast. CONTRAST:  < 100 cc > ISOVUE-370 IOPAMIDOL (ISOVUE-370) INJECTION 76% COMPARISON:  03/09/2017 CT chest and abdomen. Chest radiograph from earlier today. 10/15/2016 CT abdomen/pelvis. FINDINGS: CTA CHEST FINDINGS Cardiovascular: The study is moderate quality for the evaluation of pulmonary embolism, with evaluation of the subsegmental vessels limited by motion. There are no filling defects in the central, lobar, segmental or subsegmental pulmonary artery branches to suggest acute pulmonary embolism. Mildly atherosclerotic nonaneurysmal thoracic aorta. Normal caliber pulmonary arteries. Normal heart size. No significant pericardial fluid/thickening. Right internal jugular MediPort terminates in the right atrium. Mediastinum/Nodes: No discrete thyroid nodules. Unremarkable esophagus. No axillary adenopathy. Newly enlarged 2.0 cm right supraclavicular node  (series 7/image 8). Increased right paratracheal adenopathy measuring up to 2.1 cm (series 7/ image 24), previously 1.2 cm. Increased prevascular bilateral mediastinal adenopathy measuring up to 2.0 cm on the right (series 7/ image 26), previously 1.3 cm. Increased 1.9 cm enlarged subcarinal node (series 7/ image 42), previously 1.1 cm. Newly enlarged 1.8 cm AP window node (series 7/ image 35). Newly moderate infiltrative left hilar adenopathy measuring up to 2.0 cm (series 7/ image 48), with associated extrinsic narrowing of segmental lingular and left lower lobe airways. No right hilar adenopathy. Lungs/Pleura: No pneumothorax. No right pleural effusion. New small dependent left pleural effusion. Mild centrilobular emphysema with mild diffuse bronchial wall thickening. Stable bandlike consolidation in the anterior left upper lobe compatible with radiation fibrosis.  Stable irregular solid 1.5 cm left upper lobe pulmonary nodule (series 13/image 40). New patchy consolidation, parenchymal banding and nodularity in the posterior left upper lobe and anterior left lower lobe. A few new scattered solid pulmonary nodules in the right lung, largest 6 mm in the anterior right middle lobe (series 13/ image 76). Musculoskeletal: No aggressive appearing focal osseous lesions. Moderate thoracic spondylosis. Review of the MIP images confirms the above findings. CT ABDOMEN and PELVIS FINDINGS Hepatobiliary: New hepatomegaly. Innumerable hypoenhancing masses replacing much of the liver, significantly increased in size and number, for example a 2.0 cm superior liver mass (series 4/ image 15), increased from 0.7 cm. Normal gallbladder with no radiopaque cholelithiasis. No biliary ductal dilatation. Pancreas: Normal, with no mass or duct dilation. Spleen: Normal size. No mass. Adrenals/Urinary Tract: Newly apparent 1.4 cm left adrenal nodule with density 64 HU. No right adrenal nodule. No hydronephrosis. Subcentimeter hypodense right  renal cortical lesion is too small to characterize and is stable. No new renal lesions. Normal bladder. Stomach/Bowel: Normal non-distended stomach. Normal appendix. Mild sigmoid diverticulosis. There is a gas containing 6.0 x 4.9 x 7.2 cm multilocular abscess in the ventral left lower abdomen (series 4/image 66) with thick enhancing wall, which appears to demonstrate a fistulous connection to the proximal sigmoid colon (series 15/image 70), and which is intimately associated with multiple small bowel loops. No small bowel dilatation. No focal small bowel caliber transition. No definite small bowel wall thickening. Vascular/Lymphatic: Atherosclerotic nonaneurysmal abdominal aorta. Patent portal, splenic, hepatic and renal veins. No pathologically enlarged lymph nodes in the abdomen or pelvis. Reproductive: Status post hysterectomy, with no abnormal findings at the vaginal cuff. No adnexal mass. Other: No ascites. Musculoskeletal: Previously visualized faintly sclerotic lesions throughout the lumbar vertebral bodies are not discretely visualized on today's scan. Mild lumbar spondylosis. Review of the MIP images confirms the above findings. IMPRESSION: 1. No evidence of pulmonary embolism. 2. Complex gas-containing multilocular abscess in the anterior left lower abdomen with fistulous connection to the proximal sigmoid colon (suggesting the sequela of perforated diverticulitis), intimately associated with multiple small bowel loops. No evidence of bowel obstruction. 3. Significant progression of metastatic disease. Right supraclavicular, bilateral mediastinal and left hilar adenopathy is significantly increased. 4. New extrinsic narrowing of segmental left upper and left lower lobe airways by the infiltrative left hilar adenopathy. New patchy consolidation and nodularity in the posterior left upper lobe and anterior left lower lobe, favor a combination of postobstructive pneumonia and tumor. New scattered probable  small pulmonary metastases in the right lung. 5. Liver metastases are significantly increased in size and number with new hepatomegaly. 6. New left adrenal metastasis. 7. New small dependent left pleural effusion. 8. Aortic Atherosclerosis (ICD10-I70.0) and Emphysema (ICD10-J43.9). Electronically Signed   By: Ilona Sorrel M.D.   On: 04/24/2017 18:01   Ir Sinus/fist Tube Chk-non Gi  Result Date: 05/02/2017 CLINICAL DATA:  History of metastatic small cell lung cancer with findings worrisome for advanced metastatic disease. Unfortunately, patient developed concomitant presumed diverticular abscess within the ventral aspect of the lower abdomen for which the patient underwent successful CT-guided percutaneous drainage catheter placement on 04/25/2017. Subsequent abdominal CT performed 04/29/2017 demonstrates complete resolution of the diverticular abscess. There has been minimal output from the percutaneous drainage catheter for the past several days and as such, the patient presents now for fluoroscopic guided drainage catheter injection prior to potential removal. EXAM: SINUS TRACT INJECTION/FISTULOGRAM COMPARISON:  CT abdomen pelvis- 04/29/2017; 04/24/2017; CT-guided percutaneous drainage catheter placement - 04/25/2017  CONTRAST:  10 cc Isovue 300, administered via the existing percutaneous drainage catheter. FLUOROSCOPY TIME:  1 minute, 12 seconds (68 mGy) TECHNIQUE: The patient was positioned supine on the fluoroscopy table. A preprocedural spot fluoroscopic image was obtained of the lower pelvis and existing percutaneous drainage catheter Multiple spot fluoroscopic and radiographic images were obtained following the injection of a small amount of contrast via the existing percutaneous drainage catheter. Images reviewed in the procedure was terminated. The drainage catheter was flushed with a small amount of saline and reconnected to a gravity bag. As was placed. The patient tolerated the procedure well without  immediate postprocedural complication. FINDINGS: Preprocedural spot fluoroscopic image demonstrates unchanged positioning of left lower abdominal percutaneous drainage catheter Contrast injection demonstrates opacification of decompressed abscess cavity, however there is persistent fistulous connection between the decompressed abscess cavity in the adjacent sigmoid colon. IMPRESSION: Fistulous connection between the decompressed abscess cavity and the adjacent sigmoid colon. As such, the drainage catheter was NOT removed at this time. PLAN: - Patient was instructed to no longer flush the percutaneous drainage catheter. - Patient was instructed to maintain diligent records regarding drainage catheter output. - Patient should undergo drainage catheter injection in approximately 2 weeks. This could be done in the hospital (if the patient is still an inpatient), otherwise the injection should be performed at the interventional radiology drain Clinic [phone]). Electronically Signed   By: Sandi Mariscal M.D.   On: 05/02/2017 11:35   Ct Image Guided Drainage By Percutaneous Catheter  Result Date: 04/25/2017 INDICATION: History of metastatic small cell lung cancer with findings worrisome for advanced metastatic disease. Unfortunately, patient has developed a concomitant presumed diverticular abscess within the ventral aspect of the left lower abdomen. Given poor operative candidacy, request made for CT-guided percutaneous drainage catheter placement for infection source control purposes. EXAM: CT IMAGE GUIDED DRAINAGE BY PERCUTANEOUS CATHETER COMPARISON:  CT abdomen and pelvis - 04/24/2017 MEDICATIONS: The patient is currently admitted to the hospital and receiving intravenous antibiotics. The antibiotics were administered within an appropriate time frame prior to the initiation of the procedure. ANESTHESIA/SEDATION: Moderate (conscious) sedation was employed during this procedure. A total of Versed 2 mg and Fentanyl  100 mcg was administered intravenously. Moderate Sedation Time: 13 minutes. The patient's level of consciousness and vital signs were monitored continuously by radiology nursing throughout the procedure under my direct supervision. CONTRAST:  None COMPLICATIONS: None immediate. PROCEDURE: Informed written consent was obtained from the patient after a discussion of the risks, benefits and alternatives to treatment. The patient was placed supine on the CT gantry and a pre procedural CT was performed re-demonstrating the known abscess/fluid collection within the ventral aspect of the left lower abdomen/ pelvis with dominant air in fluid containing component measuring approximately 6.8 x 4.7 cm (image 18, series 2). The procedure was planned. A timeout was performed prior to the initiation of the procedure. The skin overlying the ventral aspect of the left lower abdomen was prepped and draped in the usual sterile fashion. The overlying soft tissues were anesthetized with 1% lidocaine with epinephrine. Appropriate trajectory was planned with the use of a 22 gauge spinal needle. An 18 gauge trocar needle was advanced into the abscess/fluid collection and a short Amplatz super stiff wire was coiled within the collection. Appropriate positioning was confirmed with a limited CT scan. The tract was serially dilated allowing placement of a 10 [place] all-purpose drainage catheter. Appropriate positioning was confirmed with a limited postprocedural CT scan. Approximately 80 Ml of purulent  fluid was aspirated. The tube was connected to a drainage bag and sutured in place. A dressing was placed. The patient tolerated the procedure well without immediate post procedural complication. IMPRESSION: Successful CT guided placement of a 10 [place] all purpose drain catheter into the presumed diverticular abscess within the ventral aspect the left lower abdomen with aspiration of 80 mL of purulent fluid. Samples were sent to the laboratory  as requested by the ordering clinical team. Electronically Signed   By: Sandi Mariscal M.D.   On: 04/25/2017 14:55     CBC Recent Labs  Lab 04/26/17 0449 04/27/17 0443 04/29/17 0451 05/01/17 0902  WBC 14.2* 11.2* 9.3 7.6  HGB 8.1* 7.7* 7.8* 7.8*  HCT 23.3* 22.3* 22.9* 23.1*  PLT 135* 125* 120* 122*  MCV 105.4* 105.2* 106.0* 109.0*  MCH 36.7* 36.3* 36.1* 36.8*  MCHC 34.8 34.5 34.1 33.8  RDW 18.4* 18.2* 18.4* 18.5*  LYMPHSABS 1.3 1.2  --   --   MONOABS 0.3 0.2  --   --   EOSABS 0.1 0.1  --   --   BASOSABS 0.0 0.0  --   --     Chemistries  Recent Labs  Lab 04/26/17 0449 04/27/17 0443 04/28/17 0411 04/29/17 0451 05/01/17 0902  NA 135 134*  --  134* 134*  K 3.6 3.8  --  3.7 3.8  CL 102 102  --  101 101  CO2 25 25  --  27 26  GLUCOSE 78 72  --  82 79  BUN 14 11  --  9 7  CREATININE 0.97 0.89 0.88 0.88 0.82  CALCIUM 9.6 9.4  --  9.6 9.3  AST 258* 234*  --  205*  --   ALT 32 31  --  30  --   ALKPHOS 446* 445*  --  492*  --   BILITOT 3.4* 2.8*  --  2.7*  --    ------------------------------------------------------------------------------------------------------------------ No results for input(s): CHOL, HDL, LDLCALC, TRIG, CHOLHDL, LDLDIRECT in the last 72 hours.  No results found for: HGBA1C ------------------------------------------------------------------------------------------------------------------ No results for input(s): TSH, T4TOTAL, T3FREE, THYROIDAB in the last 72 hours.  Invalid input(s): FREET3 ------------------------------------------------------------------------------------------------------------------ No results for input(s): VITAMINB12, FOLATE, FERRITIN, TIBC, IRON, RETICCTPCT in the last 72 hours.  Coagulation profile No results for input(s): INR, PROTIME in the last 168 hours.  No results for input(s): DDIMER in the last 72 hours.  Cardiac Enzymes No results for input(s): CKMB, TROPONINI, MYOGLOBIN in the last 168 hours.  Invalid input(s):  CK ------------------------------------------------------------------------------------------------------------------ No results found for: BNP   Roxan Hockey M.D on 05/02/2017 at 7:42 PM  Between 7am to 7pm - Pager - [phone]  After 7pm go to [email or web address] - password [email or web address]  [facility] -  Office  [phone]   Voice Recognition Viviann Spare dictation system was used to create this note, attempts have been made to correct errors. Please contact the author with questions and/or clarifications.

## 2017-05-02 NOTE — Progress Notes (Signed)
Pharmacy Antibiotic Note  Sharon [name] is a 62 y.o. female with metastatic lung cancer presented to the ED on 04/24/2017 with c/o abd pain. Abd CTA on 12/30 showed multilocular abscess in the anterior left lower abdomen and new patchy consolidation and nodularity in the posterior left upper lobe and anterior left lower lobe with suspicion for PNA/tumor.  Broad spectrum abx started on admission for PNA and abscess.  - 12/31: IR placed diverticular abscess drain   Today, 05/02/2017: - Day #9 Zosyn - Afeb, WBC and SCr stable - abscess culture is positive for strep viridan and Ecoli  Plan: - Zosyn 3.375 gm IV q8h (infuse over 4 hrs) remains appropriate dosing BUT recommend to de-escalate abx to to ceftriaxone and flagyl (this has anaerobic coverage) based on current culture sensivities and continued anaerobic coverage for remainder of therapy - monitor renal function and f/u with cultures ___________________________________  Height: 5\' 7"  (170.2 cm) Weight: 181 lb 3.5 oz (82.2 kg) IBW/kg (Calculated) : 61.6  Temp (24hrs), Avg:98.4 F (36.9 C), Min:98.2 F (36.8 C), Max:98.6 F (37 C)  Recent Labs  Lab 04/26/17 0449 04/27/17 0443 04/28/17 0411 04/29/17 0451 05/01/17 0902  WBC 14.2* 11.2*  --  9.3 7.6  CREATININE 0.97 0.89 0.88 0.88 0.82    Estimated Creatinine Clearance: 80.4 mL/min (by C-G formula based on SCr of 0.82 mg/dL).    No Known Allergies  Antimicrobials this admission:  12/30 vanc >>  1/2 12/30 zosyn >>    Dose adjustments this admission:  --  Microbiology results:  12/30 BCx x2: ngtd 12/30 UCx: NGF 12/31  MRSA PCR: neg 12/31 HIV neg 12/31 abscess: moderate GVR, rare GNR; strep viridan (S= erythro, LVQ, Vanc, PCN), Ecoli (I= amp, R= cipro)   Thank you for allowing pharmacy to be a part of this patient's care.   Adrian Saran, PharmD, BCPS Pager [phone] 05/02/2017 10:15 AM

## 2017-05-02 NOTE — Progress Notes (Signed)
CC:  Abdominal pain  Subjective: Feels much better this AM than she did last week.  Fistulogram is pending.  She sounds better from a pulmonary standpoint doing the I-S and the flutter valve.  Social worker is seeing her and they are working on some kind of placement.  Objective: Vital signs in last 24 hours: Temp:  [98.2 F (36.8 C)-98.6 F (37 C)] 98.2 F (36.8 C) (01/07 0412) Pulse Rate:  [90-111] 90 (01/07 0412) Resp:  [18-20] 18 (01/07 0412) BP: (100-108)/(51-66) 108/61 (01/07 0412) SpO2:  [90 %-100 %] 100 % (01/07 0412) Last BM Date: 05/01/17 320 p.o. 1500 IV Voided x10 Drain: 25 Stool: X6 Afebrile vital signs are stable, somewhat tachycardic No labs today.  Ongoing anemia, platelets are stable. Repeat CT scan on 1 4/19: Shows the drain is well positioned.  There was complete resolution of previously noted diverticular abscess there is a persistent soft tissue density extending from the region of the surgical drain to the sigmoid colon which may represent a fistula.  Patient just had a fistulogram.  No new abscesses or acute intra-abdominal abnormality.   Intake/Output from previous day: 01/06 0701 - 01/07 0700 In: 1745 [P.O.:320; I.V.:1210; IV Piggyback:200] Out: 25 [Drains:25] Intake/Output this shift: No intake/output data recorded.  General appearance: alert, cooperative and no distress GI: Soft, nontender, the drain is empty below what is in there looks like old blood.  It is a dark serous fluid but clear.  Does not look like stool.  Lab Results:  Recent Labs    05/01/17 0902  WBC 7.6  HGB 7.8*  HCT 23.1*  PLT 122*    BMET Recent Labs    05/01/17 0902  NA 134*  K 3.8  CL 101  CO2 26  GLUCOSE 79  BUN 7  CREATININE 0.82  CALCIUM 9.3   PT/INR No results for input(s): LABPROT, INR in the last 72 hours.  Recent Labs  Lab 04/26/17 0449 04/27/17 0443 04/29/17 0451  AST 258* 234* 205*  ALT 32 31 30  ALKPHOS 446* 445* 492*  BILITOT 3.4*  2.8* 2.7*  PROT 5.6* 5.4* 5.7*  ALBUMIN 2.0* 2.0* 2.1*     Lipase     Component Value Date/Time   LIPASE 20 04/24/2017 1544     Medications: . ferrous sulfate  325 mg Oral Daily  . fluticasone  1 spray Each Nare Daily  . fluticasone furoate-vilanterol  1 puff Inhalation Daily  . ipratropium-albuterol  3 mL Nebulization BID  . magnesium oxide  400 mg Oral QID  . mirtazapine  7.5 mg Oral QHS  . montelukast  10 mg Oral QHS  . polyethylene glycol  17 g Oral Daily  . potassium chloride  20 mEq Oral BID  . senna  2 tablet Oral BID  . sodium chloride flush  5 mL Intravenous Q8H   . sodium chloride 50 mL/hr at 05/02/17 0917  . piperacillin-tazobactam (ZOSYN)  IV 3.375 g (05/02/17 0917)   Anti-infectives (From admission, onward)   Start     Dose/Rate Route Frequency Ordered Stop   04/25/17 1800  vancomycin (VANCOCIN) IVPB 1000 mg/200 mL premix     1,000 mg 200 mL/hr over 60 Minutes Intravenous Every 24 hours 04/24/17 2024 04/27/17 2200   04/24/17 2359  piperacillin-tazobactam (ZOSYN) IVPB 3.375 g     3.375 g 12.5 mL/hr over 240 Minutes Intravenous Every 8 hours 04/24/17 2024     04/24/17 1715  piperacillin-tazobactam (ZOSYN) IVPB 3.375 g  3.375 g 100 mL/hr over 30 Minutes Intravenous  Once 04/24/17 1714 04/24/17 1815   04/24/17 1715  vancomycin (VANCOCIN) 1,500 mg in sodium chloride 0.9 % 500 mL IVPB     1,500 mg 250 mL/hr over 120 Minutes Intravenous  Once 04/24/17 1714 04/24/17 2010     Assessment/Plan Diverticulitis with perforation and abscess formation. IR drain placement 04/25/2017  Metastatic small cell lung cancer Possible postoperative pneumonia Transaminitis  FEN:IV fluids/soft diet ID: Zosyn 12/30 =>> day 8 DVT:  SCD's/anemia Foley:  None Follow up:  Dr. [facility]  Patient had a sinus injection today.  Results are still pending.  Will continue to follow.      LOS: 8 days    Sharon [name] 05/02/2017 [phone]

## 2017-05-03 LAB — CBC
HCT: 24.2 % — ABNORMAL LOW (ref 36.0–46.0)
Hemoglobin: 8.2 g/dL — ABNORMAL LOW (ref 12.0–15.0)
MCH: 37.3 pg — AB (ref 26.0–34.0)
MCHC: 33.9 g/dL (ref 30.0–36.0)
MCV: 110 fL — ABNORMAL HIGH (ref 78.0–100.0)
Platelets: 129 10*3/uL — ABNORMAL LOW (ref 150–400)
RBC: 2.2 MIL/uL — ABNORMAL LOW (ref 3.87–5.11)
RDW: 19.3 % — AB (ref 11.5–15.5)
WBC: 7.1 10*3/uL (ref 4.0–10.5)

## 2017-05-03 MED ORDER — AMOXICILLIN-POT CLAVULANATE 875-125 MG PO TABS
1.0000 | ORAL_TABLET | Freq: Two times a day (BID) | ORAL | Status: DC
  Administered 2017-05-03 – 2017-05-04 (×2): 1 via ORAL
  Filled 2017-05-03 (×2): qty 1

## 2017-05-03 MED ORDER — FLORANEX PO PACK
1.0000 g | PACK | Freq: Three times a day (TID) | ORAL | Status: DC
  Administered 2017-05-04: 1 g via ORAL
  Filled 2017-05-03 (×3): qty 1

## 2017-05-03 NOTE — Progress Notes (Signed)
Patient Demographics:    Sharon [name], is a 62 y.o. female, DOB - Aug 03, 1955, JQB:[id number]  Admit date - 04/24/2017   Admitting Physician Etta Quill, DO  Outpatient Primary MD for the patient is [name], [place]  LOS - 9   Chief Complaint  Patient presents with  . Abdominal Pain        Subjective:    Sharon [name] today has no fevers, no emesis,  No chest pain,   Tolerating solid food well,  Daughter at bedside, RT LQ pain is better,  Had multiple BMs, c/o fatigue   Assessment  & Plan :    Principal Problem:   Diverticulitis of large intestine with perforation and abscess Active Problems:   COPD, severe ([facility])   SCLC (small cell lung carcinoma), left ([name])   Transaminitis   Postobstructive pneumonia   History of antineoplastic chemotherapy   Maintenance antineoplastic immunotherapy   Dependence on supplemental oxygen   Liver metastasis ([name])   Abscess of abdominal cavity ([name])   Palliative care encounter   DNR (do not resuscitate)  Brief Narrative:    Patient is a 62 year old female with past medical history of small cell lung cancer with widespread metastatic disease.  Currently on immunotherapy.  Patient presented to the emergency department with complaints of abdominal pain.  CT chest/abdomen/pelvis done in the emergency department showed perforated diverticulitis with abscess with fistula and progression of cancer. S/p IR image guided drainage on 04/25/17. contrast injection into the Drain on 05/02/2017  demonstrates fistula between decompressed abscess cavity and sigmoid colon.   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 Remains afebrile,  General Surgery following .  IR performed drainage of the abscess, abscess culture from 04/25/2017 growing strep viridans sensitive and E. coli both to PCN, discussed with pharmacist, there is a  possibility of an anaerobe as well in the abscess cultures from 04/25/17, c/n  Zosyn  started on 04/24/2017. WBC was19.8 on admission , c/n solid diet, Per surgical team ok to STOP Zosyn and switch to Augmentin for 10 to 14 days   2)New Rt LQ Pain- improved, had multiple  BMs , Repeat CT abd/pelvis from 04/29/17 resolution of pelvic abscess, see # 1 above  3)Metastatic small cell lung cancer with worsening disease: Case discussed with Dr. Julien Nordmann (oncologist), and has failed extensive aggressive systemic chemotherapy in the past, as well as palliative radiation therapy, She has H/O extensive stage small cell lung cancer status post 6 cycles of systemic chemotherapy with cisplatin and etoposide followed by prophylactic cranial irradiation followed by palliative radiotherapy to a left upper lobe pulmonary nodule followed by stereotactic radiotherapy to solitary brain metastasis. Her metastatic disease looks to have progressed.    Patient is not very likely to benefit from immunotherapy even if she is able to tolerate it.  Palliative care consult appreciated. D/w Dr Julien Nordmann on 05/03/17 who states that the patient does not have any good  chemo or immunotherapy options, palliative care would be reasonable   4)Possible postoperative pneumonia: respiratory status continues to improve,  Per surgical team ok to STOP Zosyn and switch to Augmentin for 10 to 14 days  As above in # 1  5)Transaminitis: Cholestatic picture.  Likely secondary to metastatic disease in liver.  We will continue to monitor CMP.She has mild elevation of AST on baseline.  6)Social/Ethics- Palliative consult appreciated, patient is now a  DNR/DNI  7)Anemia- hemoglobin is down to 7.8, patient is largely asymptomatic, without transfusion if symptomatic or if hematocrit is 21 or lower  8)Disposition-discharge to skilled nursing facility for rehab with palliative care consult when bed is available,   DVT prophylaxis: SCD Code Status:  DNR/DNI Family Communication:  daughter  Disposition Plan:  SNF rehab with palliative care consult  Consultants: Surgery, oncology, palliative  Procedures: IR drainage of diverticular abscess on 04/25/17  DVT Prophylaxis  : SCDs   Lab Results  Component Value Date   PLT 129 (L) 05/03/2017    Inpatient Medications  Scheduled Meds: . ferrous sulfate  325 mg Oral Daily  . fluticasone  1 spray Each Nare Daily  . fluticasone furoate-vilanterol  1 puff Inhalation Daily  . ipratropium-albuterol  3 mL Nebulization BID  . magnesium oxide  400 mg Oral QID  . mirtazapine  7.5 mg Oral QHS  . montelukast  10 mg Oral QHS  . polyethylene glycol  17 g Oral Daily  . potassium chloride  20 mEq Oral BID  . senna  2 tablet Oral BID  . sodium chloride flush  5 mL Intravenous Q8H   Continuous Infusions: . sodium chloride 50 mL/hr at 05/03/17 0607  . piperacillin-tazobactam (ZOSYN)  IV 3.375 g (05/03/17 1612)   PRN Meds:.acetaminophen **OR** acetaminophen, albuterol, Glycerin (Adult), guaiFENesin, meclizine, morphine injection, ondansetron **OR** ondansetron (ZOFRAN) IV, sodium chloride flush   Anti-infectives (From admission, onward)   Start     Dose/Rate Route Frequency Ordered Stop   04/25/17 1800  vancomycin (VANCOCIN) IVPB 1000 mg/200 mL premix     1,000 mg 200 mL/hr over 60 Minutes Intravenous Every 24 hours 04/24/17 2024 04/27/17 2200   04/24/17 2359  piperacillin-tazobactam (ZOSYN) IVPB 3.375 g     3.375 g 12.5 mL/hr over 240 Minutes Intravenous Every 8 hours 04/24/17 2024     04/24/17 1715  piperacillin-tazobactam (ZOSYN) IVPB 3.375 g     3.375 g 100 mL/hr over 30 Minutes  Intravenous  Once 04/24/17 1714 04/24/17 1815   04/24/17 1715  vancomycin (VANCOCIN) 1,500 mg in sodium chloride 0.9 % 500 mL IVPB     1,500 mg 250 mL/hr over 120 Minutes Intravenous  Once 04/24/17 1714 04/24/17 2010       Objective:   Vitals:   05/03/17 1340 05/03/17 1411 05/03/17 1938 05/03/17 2020  BP: 122/68   118/60  Pulse: 85   (!) 106  Resp: 16   16  Temp: 98.1 F (36.7 C)   98.8 F (37.1 C)  TempSrc: Oral   Oral  SpO2: 97% 97% 94% 95%  Weight:      Height:        Wt Readings from Last 3 Encounters:  04/25/17 82.2 kg (181 lb 3.5 oz)  04/13/17 82.6 kg (182 lb)  03/29/17 84.6 kg (186 lb 9.6 oz)    Intake/Output Summary (Last 24 hours) at 05/03/2017 2158 Last data filed at 05/03/2017 2040 Gross per 24 hour  Intake 0 ml  Output 18 ml  Net -18  ml   Physical Exam  Gen:- Awake Alert,  In no apparent distress  HEENT:- [place].AT,   Neck-Supple Neck,No JVD,.  Lungs-  CTAB , right subclavian Port-A-Cath site is clean dry and intact CV- S1, S2 normal Abd-  +ve B.Sounds, Abd Soft, right lower quadrant tenderness is much improved, left lower quadrant drain without significant  Drainage/out Extremity/Skin:- Neg homan's  Psych-affect is appropriate  Data Review:   Micro Results Recent Results (from the past 240 hour(s))  Blood culture (routine x 2)     Status: None   Collection Time: 04/24/17  5:00 PM  Result Value Ref Range Status   Specimen Description BLOOD CHEST PORTA CATH  Final   Special Requests   Final    BOTTLES DRAWN AEROBIC AND ANAEROBIC Blood Culture adequate volume   Culture   Final    NO GROWTH 5 DAYS Performed at [facility], [place] [address], [place], [place] [address]    Report Status 04/29/2017 FINAL  Final  Blood culture (routine x 2)     Status: None   Collection Time: 04/24/17  5:45 PM  Result Value Ref Range Status   Specimen Description BLOOD CHEST PORTA CATH  Final   Special Requests   Final    BOTTLES DRAWN AEROBIC AND ANAEROBIC Blood  Culture results may not be optimal due to an excessive volume of blood received in culture bottles   Culture   Final    NO GROWTH 5 DAYS Performed at [facility], [place] [address] [place], [place] [address]    Report Status 04/29/2017 FINAL  Final  Urine culture     Status: None   Collection Time: 04/24/17  9:18 PM  Result Value Ref Range Status   Specimen Description URINE, CATHETERIZED  Final   Special Requests NONE  Final   Culture   Final    NO GROWTH Performed at [facility], [place] [address], [place], [place] [address]    Report Status 04/26/2017 FINAL  Final  MRSA PCR Screening     Status: None   Collection Time: 04/25/17 12:50 PM  Result Value Ref Range Status   MRSA by PCR NEGATIVE NEGATIVE Final    Comment:        The GeneXpert MRSA Assay (FDA approved for NASAL specimens only), is one component of a comprehensive MRSA colonization surveillance program. It is not intended to diagnose MRSA infection nor to guide or monitor treatment for MRSA infections.   Aerobic/Anaerobic Culture (surgical/deep wound)     Status: None   Collection Time: 04/25/17  2:55 PM  Result Value Ref Range Status   Specimen Description ABSCESS  Final   Special Requests Normal  Final   Gram Stain   Final    ABUNDANT WBC PRESENT,BOTH PMN AND MONONUCLEAR ABUNDANT GRAM POSITIVE COCCI IN PAIRS MODERATE GRAM VARIABLE ROD Performed at [facility], [place] [address] [place], [place] [address]    Culture   Final    ABUNDANT VIRIDANS STREPTOCOCCUS RARE ESCHERICHIA COLI MIXED ANAEROBIC FLORA PRESENT.  CALL LAB IF FURTHER IID REQUIRED.    Report Status 04/30/2017 FINAL  Final   Organism ID, Bacteria VIRIDANS STREPTOCOCCUS  Final   Organism ID, Bacteria ESCHERICHIA COLI  Final      Susceptibility   Escherichia coli - MIC*    AMPICILLIN 16 INTERMEDIATE Intermediate     CEFAZOLIN <=4 SENSITIVE Sensitive     CEFEPIME <=1 SENSITIVE Sensitive     CEFTAZIDIME <=  1 SENSITIVE Sensitive      CEFTRIAXONE <=1 SENSITIVE Sensitive     CIPROFLOXACIN >=4 RESISTANT Resistant     GENTAMICIN <=1 SENSITIVE Sensitive     IMIPENEM <=0.25 SENSITIVE Sensitive     TRIMETH/SULFA <=20 SENSITIVE Sensitive     AMPICILLIN/SULBACTAM 4 SENSITIVE Sensitive     PIP/TAZO <=4 SENSITIVE Sensitive     Extended ESBL NEGATIVE Sensitive     * RARE ESCHERICHIA COLI   Viridans streptococcus - MIC*    PENICILLIN <=0.06 SENSITIVE Sensitive     ERYTHROMYCIN <=0.12 SENSITIVE Sensitive     LEVOFLOXACIN 0.5 SENSITIVE Sensitive     VANCOMYCIN 0.5 SENSITIVE Sensitive     * ABUNDANT VIRIDANS STREPTOCOCCUS   Radiology Reports Dg Chest 2 View  Result Date: 04/24/2017 CLINICAL DATA:  Shortness of breath. All on immunotherapy for lung cancer. Ex-smoker. EXAM: CHEST  2 VIEW COMPARISON:  CT 03/09/2017.  Plain film of 01/09/2017. FINDINGS: Lateral view degraded by patient arm position. Right Port-A-Cath terminates at the low SVC. Midline trachea. Normal heart size. New small left pleural effusion. Probable treatment effects in the left upper lobe laterally. Clear right lung. Left base subsegmental atelectasis. IMPRESSION: New small left pleural effusion with adjacent left base subsegmental atelectasis. Electronically Signed   By: Abigail Miyamoto M.D.   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 History of palliative radiotherapy of an enlarging left upper lobe pulmonary nodule. Ongoing medical therapy. Patient presents with generalized abdominal pain and clinical concern for pulmonary embolism. EXAM: CT ANGIOGRAPHY CHEST CT ABDOMEN AND PELVIS WITH CONTRAST TECHNIQUE: Multidetector CT imaging of the chest was performed using the standard protocol during bolus administration of intravenous contrast. Multiplanar CT image reconstructions and MIPs were obtained to evaluate the  vascular anatomy. Multidetector CT imaging of the abdomen and pelvis was performed using the standard protocol during bolus administration of intravenous contrast. CONTRAST:  < 100 cc > ISOVUE-370 IOPAMIDOL (ISOVUE-370) INJECTION 76% COMPARISON:  03/09/2017 CT chest and abdomen. Chest radiograph from earlier today. 10/15/2016 CT abdomen/pelvis. FINDINGS: CTA CHEST FINDINGS Cardiovascular: The study is moderate quality for the evaluation of pulmonary embolism, with evaluation of the subsegmental vessels limited by motion. There are no filling defects in the central, lobar, segmental or subsegmental pulmonary artery branches to suggest acute pulmonary embolism. Mildly atherosclerotic nonaneurysmal thoracic aorta. Normal caliber pulmonary arteries. Normal heart size. No significant pericardial fluid/thickening. Right internal jugular MediPort terminates in the right atrium. Mediastinum/Nodes: No discrete thyroid nodules. Unremarkable esophagus. No axillary adenopathy. Newly enlarged 2.0 cm right supraclavicular node (series 7/image 8). Increased right paratracheal adenopathy measuring up to 2.1 cm (series 7/ image 24), previously 1.2 cm. Increased prevascular bilateral mediastinal adenopathy measuring up to 2.0 cm on the right (series 7/ image 26), previously 1.3 cm. Increased 1.9 cm enlarged subcarinal node (series 7/ image 42), previously 1.1 cm. Newly enlarged 1.8 cm AP window node (series 7/ image 35). Newly moderate infiltrative left hilar adenopathy measuring up to 2.0 cm (series 7/ image 48), with associated extrinsic narrowing of segmental lingular and left lower lobe airways. No right hilar adenopathy. Lungs/Pleura: No pneumothorax. No right pleural effusion. New small dependent left pleural effusion. Mild centrilobular emphysema with mild diffuse bronchial wall thickening. Stable bandlike consolidation in the anterior left upper lobe compatible with radiation fibrosis. Stable irregular solid 1.5 cm left upper  lobe pulmonary nodule (  series 13/image 40). New patchy consolidation, parenchymal banding and nodularity in the posterior left upper lobe and anterior left lower lobe. A few new scattered solid pulmonary nodules in the right lung, largest 6 mm in the anterior right middle lobe (series 13/ image 76). Musculoskeletal: No aggressive appearing focal osseous lesions. Moderate thoracic spondylosis. Review of the MIP images confirms the above findings. CT ABDOMEN and PELVIS FINDINGS Hepatobiliary: New hepatomegaly. Innumerable hypoenhancing masses replacing much of the liver, significantly increased in size and number, for example a 2.0 cm superior liver mass (series 4/ image 15), increased from 0.7 cm. Normal gallbladder with no radiopaque cholelithiasis. No biliary ductal dilatation. Pancreas: Normal, with no mass or duct dilation. Spleen: Normal size. No mass. Adrenals/Urinary Tract: Newly apparent 1.4 cm left adrenal nodule with density 64 HU. No right adrenal nodule. No hydronephrosis. Subcentimeter hypodense right renal cortical lesion is too small to characterize and is stable. No new renal lesions. Normal bladder. Stomach/Bowel: Normal non-distended stomach. Normal appendix. Mild sigmoid diverticulosis. There is a gas containing 6.0 x 4.9 x 7.2 cm multilocular abscess in the ventral left lower abdomen (series 4/image 66) with thick enhancing wall, which appears to demonstrate a fistulous connection to the proximal sigmoid colon (series 15/image 70), and which is intimately associated with multiple small bowel loops. No small bowel dilatation. No focal small bowel caliber transition. No definite small bowel wall thickening. Vascular/Lymphatic: Atherosclerotic nonaneurysmal abdominal aorta. Patent portal, splenic, hepatic and renal veins. No pathologically enlarged lymph nodes in the abdomen or pelvis. Reproductive: Status post hysterectomy, with no abnormal findings at the vaginal cuff. No adnexal mass. Other: No  ascites. Musculoskeletal: Previously visualized faintly sclerotic lesions throughout the lumbar vertebral bodies are not discretely visualized on today's scan. Mild lumbar spondylosis. Review of the MIP images confirms the above findings. IMPRESSION: 1. No evidence of pulmonary embolism. 2. Complex gas-containing multilocular abscess in the anterior left lower abdomen with fistulous connection to the proximal sigmoid colon (suggesting the sequela of perforated diverticulitis), intimately associated with multiple small bowel loops. No evidence of bowel obstruction. 3. Significant progression of metastatic disease. Right supraclavicular, bilateral mediastinal and left hilar adenopathy is significantly increased. 4. New extrinsic narrowing of segmental left upper and left lower lobe airways by the infiltrative left hilar adenopathy. New patchy consolidation and nodularity in the posterior left upper lobe and anterior left lower lobe, favor a combination of postobstructive pneumonia and tumor. New scattered probable small pulmonary metastases in the right lung. 5. Liver metastases are significantly increased in size and number with new hepatomegaly. 6. New left adrenal metastasis. 7. New small dependent left pleural effusion. 8. Aortic Atherosclerosis (ICD10-I70.0) and Emphysema (ICD10-J43.9). Electronically Signed   By: Ilona Sorrel M.D.   On: 04/24/2017 18:01   Ct Abdomen Pelvis W Contrast  Result Date: 04/30/2017 CLINICAL DATA:  62 year old female with a history of extensive stage small cell lung carcinoma including brain metastases and a recent diverticular abscess treated with percutaneous drain placement on 04/25/2017. New onset right lower quadrant pain. EXAM: CT ABDOMEN AND PELVIS WITH CONTRAST TECHNIQUE: Multidetector CT imaging of the abdomen and pelvis was performed using the standard protocol following bolus administration of intravenous contrast. CONTRAST:  121mL ISOVUE-300 IOPAMIDOL (ISOVUE-300)  INJECTION 61% COMPARISON:  Prior CT scan of the abdomen and pelvis 04/24/2017; CT drain placement 04/25/2017 FINDINGS: Lower chest: Enlarged and now moderate simple layering left pleural effusion with associated left lower lobe atelectasis. New small right-sided pleural effusion also with associated subsegmental atelectasis. The heart  remains normal in size. Incompletely imaged port catheter tip in the upper right atrium. No pericardial effusion. Unremarkable distal thoracic esophagus. Hepatobiliary: Innumerable hypoechoic lesions scattered throughout the liver consistent with extensive metastatic disease. No significant interval change compared to recent prior imaging. Gallbladder is unremarkable. No intra or extrahepatic biliary ductal dilatation. Pancreas: Unremarkable. No pancreatic ductal dilatation or surrounding inflammatory changes. Spleen: Normal. Adrenals/Urinary Tract: Stable left adrenal nodule. Unremarkable right adrenal gland. The kidneys are normal in appearance. No hydronephrosis or nephrolithiasis. Unremarkable ureters and bladder. Stomach/Bowel: Moderate size rectal stool ball measuring 5 x 4.6 cm. There is a mild thickening of the adjacent rectal wall. Scattered sigmoid diverticula. Decreased inflammation. Small soft tissue tracks continues to extend from the region of the sigmoid colon to the prior abscess cavity. The abscess cavity is completely decompressed. The drainage catheter remains in good position. Normal appendix in a retrocecal position. No evidence of bowel obstruction. Vascular/Lymphatic: Atherosclerotic plaque in the abdominal aorta. No aneurysm or dissection. No significant lymphadenopathy. Reproductive: Status post hysterectomy. No adnexal masses. Other: Small volume perihepatic an intra mesenteric ascites. No loculation or gas to suggest active infection. No abdominal wall hernia. Musculoskeletal: No acute or significant osseous findings. IMPRESSION: 1. Well-positioned drainage  catheter with complete resolution of the previously noted diverticular abscess. 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 Recommended drain injection prior to removal. 2. No new abscess or acute intraabdominal abnormality. 3. Moderate volume of formed stool including a 5 cm rectal stool ball suggests constipation. 4. Slightly increased but still minimal abdominal ascites which is likely malignant or related to the diffuse hepatic metastatic disease. 5. New small right sided pleural effusion and enlarged moderate left-sided pleural effusion. Electronically Signed   By: Jacqulynn Cadet M.D.   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 History of palliative radiotherapy of an enlarging left upper lobe pulmonary nodule. Ongoing medical therapy. Patient presents with generalized abdominal pain and clinical concern for pulmonary embolism. EXAM: CT ANGIOGRAPHY CHEST CT ABDOMEN AND PELVIS WITH CONTRAST TECHNIQUE: Multidetector CT imaging of the chest was performed using the standard protocol during bolus administration of intravenous contrast. Multiplanar CT image reconstructions and MIPs were obtained to evaluate the vascular anatomy. Multidetector CT imaging of the abdomen and pelvis was performed using the standard protocol during bolus administration of intravenous contrast. CONTRAST:  < 100 cc > ISOVUE-370 IOPAMIDOL (ISOVUE-370) INJECTION 76% COMPARISON:  03/09/2017 CT chest and abdomen. Chest radiograph from earlier today. 10/15/2016 CT abdomen/pelvis. FINDINGS: CTA CHEST FINDINGS Cardiovascular: The study is moderate quality for the evaluation of pulmonary embolism, with evaluation of the subsegmental vessels limited by motion. There are no filling  defects in the central, lobar, segmental or subsegmental pulmonary artery branches to suggest acute pulmonary embolism. Mildly atherosclerotic nonaneurysmal thoracic aorta. Normal caliber pulmonary arteries. Normal heart size. No significant pericardial fluid/thickening. Right internal jugular MediPort terminates in the right atrium. Mediastinum/Nodes: No discrete thyroid nodules. Unremarkable esophagus. No axillary adenopathy. Newly enlarged 2.0 cm right supraclavicular node (series 7/image 8). Increased right paratracheal adenopathy measuring up to 2.1 cm (series 7/ image 24), previously 1.2 cm. Increased prevascular bilateral mediastinal adenopathy measuring up to 2.0 cm on the right (series 7/ image 26), previously 1.3 cm. Increased 1.9 cm enlarged subcarinal node (series  7/ image 42), previously 1.1 cm. Newly enlarged 1.8 cm AP window node (series 7/ image 35). Newly moderate infiltrative left hilar adenopathy measuring up to 2.0 cm (series 7/ image 48), with associated extrinsic narrowing of segmental lingular and left lower lobe airways. No right hilar adenopathy. Lungs/Pleura: No pneumothorax. No right pleural effusion. New small dependent left pleural effusion. Mild centrilobular emphysema with mild diffuse bronchial wall thickening. Stable bandlike consolidation in the anterior left upper lobe compatible with radiation fibrosis. Stable irregular solid 1.5 cm left upper lobe pulmonary nodule (series 13/image 40). New patchy consolidation, parenchymal banding and nodularity in the posterior left upper lobe and anterior left lower lobe. A few new scattered solid pulmonary nodules in the right lung, largest 6 mm in the anterior right middle lobe (series 13/ image 76). Musculoskeletal: No aggressive appearing focal osseous lesions. Moderate thoracic spondylosis. Review of the MIP images confirms the above findings. CT ABDOMEN and PELVIS FINDINGS Hepatobiliary: New hepatomegaly. Innumerable hypoenhancing masses  replacing much of the liver, significantly increased in size and number, for example a 2.0 cm superior liver mass (series 4/ image 15), increased from 0.7 cm. Normal gallbladder with no radiopaque cholelithiasis. No biliary ductal dilatation. Pancreas: Normal, with no mass or duct dilation. Spleen: Normal size. No mass. Adrenals/Urinary Tract: Newly apparent 1.4 cm left adrenal nodule with density 64 HU. No right adrenal nodule. No hydronephrosis. Subcentimeter hypodense right renal cortical lesion is too small to characterize and is stable. No new renal lesions. Normal bladder. Stomach/Bowel: Normal non-distended stomach. Normal appendix. Mild sigmoid diverticulosis. There is a gas containing 6.0 x 4.9 x 7.2 cm multilocular abscess in the ventral left lower abdomen (series 4/image 66) with thick enhancing wall, which appears to demonstrate a fistulous connection to the proximal sigmoid colon (series 15/image 70), and which is intimately associated with multiple small bowel loops. No small bowel dilatation. No focal small bowel caliber transition. No definite small bowel wall thickening. Vascular/Lymphatic: Atherosclerotic nonaneurysmal abdominal aorta. Patent portal, splenic, hepatic and renal veins. No pathologically enlarged lymph nodes in the abdomen or pelvis. Reproductive: Status post hysterectomy, with no abnormal findings at the vaginal cuff. No adnexal mass. Other: No ascites. Musculoskeletal: Previously visualized faintly sclerotic lesions throughout the lumbar vertebral bodies are not discretely visualized on today's scan. Mild lumbar spondylosis. Review of the MIP images confirms the above findings. IMPRESSION: 1. No evidence of pulmonary embolism. 2. Complex gas-containing multilocular abscess in the anterior left lower abdomen with fistulous connection to the proximal sigmoid colon (suggesting the sequela of perforated diverticulitis), intimately associated with multiple small bowel loops. No evidence  of bowel obstruction. 3. Significant progression of metastatic disease. Right supraclavicular, bilateral mediastinal and left hilar adenopathy is significantly increased. 4. New extrinsic narrowing of segmental left upper and left lower lobe airways by the infiltrative left hilar adenopathy. New patchy consolidation and nodularity in the posterior left upper lobe and anterior left lower lobe, favor a combination of postobstructive pneumonia and tumor. New scattered probable small pulmonary metastases in the right lung. 5. Liver metastases are significantly increased in size and number with new hepatomegaly. 6. New left adrenal metastasis. 7. New small dependent left pleural effusion. 8. Aortic Atherosclerosis (ICD10-I70.0) and Emphysema (ICD10-J43.9). Electronically Signed   By: Ilona Sorrel M.D.   On: 04/24/2017 18:01   Ir Sinus/fist Tube Chk-non Gi  Result Date: 05/02/2017 CLINICAL DATA:  History of metastatic small cell lung cancer with findings worrisome for advanced metastatic disease. Unfortunately, patient developed concomitant presumed diverticular abscess  within the ventral aspect of the lower abdomen for which the patient underwent successful CT-guided percutaneous drainage catheter placement on 04/25/2017. Subsequent abdominal CT performed 04/29/2017 demonstrates complete resolution of the diverticular abscess. There has been minimal output from the percutaneous drainage catheter for the past several days and as such, the patient presents now for fluoroscopic guided drainage catheter injection prior to potential removal. EXAM: SINUS TRACT INJECTION/FISTULOGRAM COMPARISON:  CT abdomen pelvis- 04/29/2017; 04/24/2017; CT-guided percutaneous drainage catheter placement - 04/25/2017 CONTRAST:  10 cc Isovue 300, administered via the existing percutaneous drainage catheter. FLUOROSCOPY TIME:  1 minute, 12 seconds (68 mGy) TECHNIQUE: The patient was positioned supine on the fluoroscopy table. A preprocedural  spot fluoroscopic image was obtained of the lower pelvis and existing percutaneous drainage catheter Multiple spot fluoroscopic and radiographic images were obtained following the injection of a small amount of contrast via the existing percutaneous drainage catheter. Images reviewed in the procedure was terminated. The drainage catheter was flushed with a small amount of saline and reconnected to a gravity bag. As was placed. The patient tolerated the procedure well without immediate postprocedural complication. FINDINGS: Preprocedural spot fluoroscopic image demonstrates unchanged positioning of left lower abdominal percutaneous drainage catheter Contrast injection demonstrates opacification of decompressed abscess cavity, however there is persistent fistulous connection between the decompressed abscess cavity in the adjacent sigmoid colon. IMPRESSION: Fistulous connection between the decompressed abscess cavity and the adjacent sigmoid colon. As such, the drainage catheter was NOT removed at this time. PLAN: - Patient was instructed to no longer flush the percutaneous drainage catheter. - Patient was instructed to maintain diligent records regarding drainage catheter output. - Patient should undergo drainage catheter injection in approximately 2 weeks. This could be done in the hospital (if the patient is still an inpatient), otherwise the injection should be performed at the interventional radiology drain Clinic [phone]). Electronically Signed   By: Sandi Mariscal M.D.   On: 05/02/2017 11:35   Ct Image Guided Drainage By Percutaneous Catheter  Result Date: 04/25/2017 INDICATION: History of metastatic small cell lung cancer with findings worrisome for advanced metastatic disease. Unfortunately, patient has developed a concomitant presumed diverticular abscess within the ventral aspect of the left lower abdomen. Given poor operative candidacy, request made for CT-guided percutaneous drainage catheter placement  for infection source control purposes. EXAM: CT IMAGE GUIDED DRAINAGE BY PERCUTANEOUS CATHETER COMPARISON:  CT abdomen and pelvis - 04/24/2017 MEDICATIONS: The patient is currently admitted to the hospital and receiving intravenous antibiotics. The antibiotics were administered within an appropriate time frame prior to the initiation of the procedure. ANESTHESIA/SEDATION: Moderate (conscious) sedation was employed during this procedure. A total of Versed 2 mg and Fentanyl 100 mcg was administered intravenously. Moderate Sedation Time: 13 minutes. The patient's level of consciousness and vital signs were monitored continuously by radiology nursing throughout the procedure under my direct supervision. CONTRAST:  None COMPLICATIONS: None immediate. PROCEDURE: Informed written consent was obtained from the patient after a discussion of the risks, benefits and alternatives to treatment. The patient was placed supine on the CT gantry and a pre procedural CT was performed re-demonstrating the known abscess/fluid collection within the ventral aspect of the left lower abdomen/ pelvis with dominant air in fluid containing component measuring approximately 6.8 x 4.7 cm (image 18, series 2). The procedure was planned. A timeout was performed prior to the initiation of the procedure. The skin overlying the ventral aspect of the left lower abdomen was prepped and draped in the usual sterile fashion. The overlying  soft tissues were anesthetized with 1% lidocaine with epinephrine. Appropriate trajectory was planned with the use of a 22 gauge spinal needle. An 18 gauge trocar needle was advanced into the abscess/fluid collection and a short Amplatz super stiff wire was coiled within the collection. Appropriate positioning was confirmed with a limited CT scan. The tract was serially dilated allowing placement of a 10 [place] all-purpose drainage catheter. Appropriate positioning was confirmed with a limited postprocedural CT scan.  Approximately 80 Ml of purulent fluid was aspirated. The tube was connected to a drainage bag and sutured in place. A dressing was placed. The patient tolerated the procedure well without immediate post procedural complication. IMPRESSION: Successful CT guided placement of a 10 French all purpose drain catheter into the presumed diverticular abscess within the ventral aspect the left lower abdomen with aspiration of 80 mL of purulent fluid. Samples were sent to the laboratory as requested by the ordering clinical team. Electronically Signed   By: Sandi Mariscal M.D.   On: 04/25/2017 14:55     CBC Recent Labs  Lab 04/27/17 0443 04/29/17 0451 05/01/17 0902 05/03/17 0438  WBC 11.2* 9.3 7.6 7.1  HGB 7.7* 7.8* 7.8* 8.2*  HCT 22.3* 22.9* 23.1* 24.2*  PLT 125* 120* 122* 129*  MCV 105.2* 106.0* 109.0* 110.0*  MCH 36.3* 36.1* 36.8* 37.3*  MCHC 34.5 34.1 33.8 33.9  RDW 18.2* 18.4* 18.5* 19.3*  LYMPHSABS 1.2  --   --   --   MONOABS 0.2  --   --   --   EOSABS 0.1  --   --   --   BASOSABS 0.0  --   --   --     Chemistries  Recent Labs  Lab 04/27/17 0443 04/28/17 0411 04/29/17 0451 05/01/17 0902  NA 134*  --  134* 134*  K 3.8  --  3.7 3.8  CL 102  --  101 101  CO2 25  --  27 26  GLUCOSE 72  --  82 79  BUN 11  --  9 7  CREATININE 0.89 0.88 0.88 0.82  CALCIUM 9.4  --  9.6 9.3  AST 234*  --  205*  --   ALT 31  --  30  --   ALKPHOS 445*  --  492*  --   BILITOT 2.8*  --  2.7*  --    ------------------------------------------------------------------------------------------------------------------ No results for input(s): CHOL, HDL, LDLCALC, TRIG, CHOLHDL, LDLDIRECT in the last 72 hours.  No results found for: HGBA1C ------------------------------------------------------------------------------------------------------------------ No results for input(s): TSH, T4TOTAL, T3FREE, THYROIDAB in the last 72 hours.  Invalid input(s):  FREET3 ------------------------------------------------------------------------------------------------------------------ No results for input(s): VITAMINB12, FOLATE, FERRITIN, TIBC, IRON, RETICCTPCT in the last 72 hours.  Coagulation profile No results for input(s): INR, PROTIME in the last 168 hours.  No results for input(s): DDIMER in the last 72 hours.  Cardiac Enzymes No results for input(s): CKMB, TROPONINI, MYOGLOBIN in the last 168 hours.  Invalid input(s): CK ------------------------------------------------------------------------------------------------------------------ No results found for: BNP   Roxan Hockey M.D on 05/03/2017 at 9:58 PM  Between 7am to 7pm - Pager - [phone]  After 7pm go to [email or web address] - password [email or web address]  [facility] -  Office  [phone]  Voice Recognition Viviann Spare dictation system was used to create this note, attempts have been made to correct errors. Please contact the author with questions and/or clarifications.

## 2017-05-03 NOTE — Progress Notes (Signed)
PT Cancellation Note  Patient Details Name: Sharon [name] MRN: [id number] DOB: 1955-06-17   Cancelled Treatment:    Reason Eval/Treat Not Completed: Fatigue/lethargy limiting ability to participate, states that she knows she needs to walk but is not. Encouraged to ambulate with staff.   Claretha Cooper 05/03/2017, 4:34 PM

## 2017-05-03 NOTE — Progress Notes (Signed)
CC: Abdominal pain  Subjective: She continues to have some abdominal discomfort but it is more on the right side than the left where her drain is.  Very little coming from the drain.  Objective: Vital signs in last 24 hours: Temp:  [98 F (36.7 C)-98.7 F (37.1 C)] 98 F (36.7 C) (01/08 0433) Pulse Rate:  [95-100] 99 (01/08 0433) Resp:  [17-18] 18 (01/08 0433) BP: (114-133)/(56-66) 133/56 (01/08 0433) SpO2:  [96 %-98 %] 97 % (01/08 0433) Last BM Date: 05/03/17 180 p.o. recorded 600 IV Voided x3 Drain: 40 Stool: X4 Afebrile, VSS WBC 7.1 Hemoglobin 8.2/hematocrit 24.2 Platelets 129,000 Fistulogram yesterday shows a fistulous connection between the decompressed abscess cavity and adjacent colon.  Flushing of the catheter was discontinued with plans to repeat injection in 2 weeks. Intake/Output from previous day: 01/07 0701 - 01/08 0700 In: 780 [P.O.:180; I.V.:600] Out: 40 [Drains:40] Intake/Output this shift: No intake/output data recorded.  General appearance: alert, cooperative and no distress GI: Some ongoing tenderness mostly on the right side.  No distention, tolerating the diet and having bowel movements.  Drainage is a reddish clear fluid.  Lab Results:  Recent Labs    05/01/17 0902 05/03/17 0438  WBC 7.6 7.1  HGB 7.8* 8.2*  HCT 23.1* 24.2*  PLT 122* 129*    BMET Recent Labs    05/01/17 0902  NA 134*  K 3.8  CL 101  CO2 26  GLUCOSE 79  BUN 7  CREATININE 0.82  CALCIUM 9.3   PT/INR No results for input(s): LABPROT, INR in the last 72 hours.  Recent Labs  Lab 04/27/17 0443 04/29/17 0451  AST 234* 205*  ALT 31 30  ALKPHOS 445* 492*  BILITOT 2.8* 2.7*  PROT 5.4* 5.7*  ALBUMIN 2.0* 2.1*     Lipase     Component Value Date/Time   LIPASE 20 04/24/2017 1544     Medications: . ferrous sulfate  325 mg Oral Daily  . fluticasone  1 spray Each Nare Daily  . fluticasone furoate-vilanterol  1 puff Inhalation Daily  .  ipratropium-albuterol  3 mL Nebulization BID  . magnesium oxide  400 mg Oral QID  . mirtazapine  7.5 mg Oral QHS  . montelukast  10 mg Oral QHS  . polyethylene glycol  17 g Oral Daily  . potassium chloride  20 mEq Oral BID  . senna  2 tablet Oral BID  . sodium chloride flush  5 mL Intravenous Q8H   Anti-infectives (From admission, onward)   Start     Dose/Rate Route Frequency Ordered Stop   04/25/17 1800  vancomycin (VANCOCIN) IVPB 1000 mg/200 mL premix     1,000 mg 200 mL/hr over 60 Minutes Intravenous Every 24 hours 04/24/17 2024 04/27/17 2200   04/24/17 2359  piperacillin-tazobactam (ZOSYN) IVPB 3.375 g     3.375 g 12.5 mL/hr over 240 Minutes Intravenous Every 8 hours 04/24/17 2024     04/24/17 1715  piperacillin-tazobactam (ZOSYN) IVPB 3.375 g     3.375 g 100 mL/hr over 30 Minutes Intravenous  Once 04/24/17 1714 04/24/17 1815   04/24/17 1715  vancomycin (VANCOCIN) 1,500 mg in sodium chloride 0.9 % 500 mL IVPB     1,500 mg 250 mL/hr over 120 Minutes Intravenous  Once 04/24/17 1714 04/24/17 2010      Assessment/Plan Diverticulitis with perforation and abscess formation. Sigmoid colon fistula abscess cavity IR drain placement 04/25/2017  Metastatic small cell lung cancer Possible postoperative pneumonia Transaminitis  FEN:IV  fluids/soft diet GY:BWLSL 12/30 =>> day 10 DVT: SCD's/anemia Foley: None Follow up: Dr. [facility]man [facility]  Plan: IR recommends repeat injection of the drain in 2 weeks.  No further irrigation of the drain.  We will continue her on antibiotics during this time.  I will check with Dr. Marlou Starks and see if we can switch her over to oral antibiotics.         LOS: 9 days    Berel Najjar 05/03/2017 [phone]

## 2017-05-03 NOTE — Progress Notes (Signed)
Referring Physician(s): Gross,S  Supervising Physician: Marybelle Killings  Patient Status:  [facility] - In-pt  Chief Complaint:  Pelvic abscess  Subjective: Pt doing ok; no new c/o; still with some rt sided abd discomfort   Allergies: Patient has no known allergies.  Medications: Prior to Admission medications   Medication Sig Start Date End Date Taking? Authorizing Provider  albuterol (PROVENTIL HFA;VENTOLIN HFA) 108 (90 Base) MCG/ACT inhaler Inhale 1 puff into the lungs every 6 (six) hours as needed for wheezing or shortness of breath.   Yes [provider]  budesonide-formoterol (SYMBICORT) 160-4.5 MCG/ACT inhaler Inhale 2 puffs into the lungs 2 (two) times daily. 08/17/16  Yes Javier Glazier, MD  ferrous sulfate 325 (65 FE) MG EC tablet Take 325 mg by mouth daily.   Yes [provider]  fluticasone (FLONASE) 50 MCG/ACT nasal spray Place 1 spray into both nostrils daily.  08/20/16  Yes [provider]  Ipratropium-Albuterol (COMBIVENT RESPIMAT) 20-100 MCG/ACT AERS respimat Inhale 1 puff into the lungs every 6 (six) hours.   Yes [provider]  loperamide (IMODIUM) 2 MG capsule Take by mouth as needed for diarrhea or loose stools.   Yes [provider]  LORazepam (ATIVAN) 0.5 MG tablet 1 tablet po 30 minutes prior to radiation or MRI 09/28/16  Yes Hayden Pedro, PA-C  magnesium oxide (MAG-OX) 400 (241.3 Mg) MG tablet Take 1 tablet (400 mg total) by mouth 4 (four) times daily. 01/26/17  Yes Curcio, Roselie Awkward, NP  meclizine (ANTIVERT) 12.5 MG tablet Take 1-2 tablets (12.5-25 mg total) by mouth 3 (three) times daily as needed for dizziness. 06/05/16  Yes Virgel Manifold, MD  methylPREDNISolone (MEDROL) 4 MG tablet Take 6 tabs on day 1, 5 tabs on day 2, 4 tabs on day 3, 3 tabs on day 4, 2 tabs on day 5, 1 tab on day 6, and then stop 04/13/17  Yes Curcio, Roselie Awkward, NP  montelukast (SINGULAIR) 10 MG tablet Take 10 mg by mouth at bedtime.   09/06/16  Yes [provider]  ondansetron (ZOFRAN) 8 MG tablet Take 1 tablet (8 mg total) every 8 (eight) hours as needed by mouth for nausea or vomiting. 03/11/17  Yes Curcio, Roselie Awkward, NP  polyethylene glycol powder (GLYCOLAX/MIRALAX) powder Take 1 Container by mouth daily.   Yes [provider]  potassium chloride 20 MEQ/15ML (10%) SOLN Take 15 mLs (20 mEq total) by mouth 2 (two) times daily. 02/25/17  Yes Curcio, Roselie Awkward, NP  prochlorperazine (COMPAZINE) 10 MG tablet TAKE 1 TABLET BY MOUTH EVERY 6 HOURS AS NEEDED FOR NAUSEA AND VOMITING 02/11/17  Yes Curcio, Roselie Awkward, NP  SANTYL ointment APP TOPICALLY UTD 12/08/16  Yes [provider]  senna (SENOKOT) 8.6 MG TABS tablet Take 1 tablet (8.6 mg total) by mouth daily. 10/17/16  Yes Patrecia Pour, MD  sucralfate (CARAFATE) 1 GM/10ML suspension Take 10 mLs (1 g total) by mouth 4 (four) times daily -  with meals and at bedtime. 01/19/17  Yes Gery Pray, MD  temozolomide (TEMODAR) 140 MG capsule Take 2 capsules (280 mg total) daily by mouth. Take for days 1-5 every 28 days. May take on empty stomach & at bedtime to decrease N&V. 03/15/17  Yes Curt Bears, MD  UNABLE TO FIND Take 10 mLs by mouth daily as needed. 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 New onset right lower quadrant pain. EXAM: CT ABDOMEN AND PELVIS WITH CONTRAST TECHNIQUE: Multidetector CT imaging of the abdomen  and pelvis was performed using the standard protocol following bolus administration of intravenous contrast. CONTRAST:  146mL ISOVUE-300 IOPAMIDOL (ISOVUE-300) INJECTION 61% COMPARISON:  Prior CT scan of the abdomen and pelvis 04/24/2017; CT drain placement 04/25/2017 FINDINGS: Lower chest: Enlarged and now moderate simple layering left pleural effusion with associated left lower lobe atelectasis. New small right-sided pleural effusion also with associated subsegmental atelectasis. The heart remains normal in size. Incompletely imaged port catheter tip in the upper right atrium. No pericardial effusion. Unremarkable distal thoracic esophagus. Hepatobiliary: Innumerable hypoechoic lesions scattered throughout the liver consistent with extensive metastatic disease. No significant interval change compared to recent prior imaging. Gallbladder is unremarkable. No intra or extrahepatic biliary ductal dilatation. Pancreas: Unremarkable. No pancreatic ductal dilatation or surrounding inflammatory changes. Spleen: Normal. Adrenals/Urinary Tract: Stable left adrenal nodule. Unremarkable right adrenal gland. The kidneys are normal in appearance. No hydronephrosis or nephrolithiasis. Unremarkable ureters and bladder. Stomach/Bowel: Moderate size rectal stool ball measuring 5 x 4.6 cm. There is a mild thickening of the adjacent rectal wall. Scattered sigmoid diverticula. Decreased inflammation. Small soft tissue tracks continues to extend from the region of the sigmoid colon to the prior abscess cavity. The abscess cavity is completely decompressed. The drainage catheter remains in good position. Normal appendix in a retrocecal position. No evidence of bowel obstruction. Vascular/Lymphatic: Atherosclerotic plaque in the abdominal aorta. No aneurysm or dissection. No significant lymphadenopathy. Reproductive: Status post hysterectomy. No adnexal masses. Other: Small volume perihepatic an intra mesenteric ascites. No loculation or  gas to suggest active infection. No abdominal wall hernia. Musculoskeletal: No acute or significant osseous findings. IMPRESSION: 1. Well-positioned drainage catheter with complete resolution of the previously noted diverticular abscess. 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 Recommended drain injection prior to removal. 2. No new abscess or acute intraabdominal abnormality. 3. Moderate volume of formed stool including a 5 cm rectal stool ball suggests constipation. 4. Slightly increased but still minimal abdominal ascites which is likely malignant or related to the diffuse hepatic metastatic disease. 5. New small right sided pleural effusion and enlarged moderate left-sided pleural effusion. Electronically Signed   By: Jacqulynn Cadet M.D.   On: 04/30/2017 08:46   Ir Sinus/fist Tube Chk-non Gi  Result Date: 05/02/2017 CLINICAL DATA:  History of metastatic small cell lung cancer with findings worrisome for advanced metastatic disease. Unfortunately, patient developed concomitant presumed diverticular abscess within the ventral aspect of the lower abdomen for which the patient underwent successful CT-guided percutaneous drainage catheter placement on 04/25/2017. Subsequent abdominal CT performed 04/29/2017 demonstrates complete resolution of the diverticular abscess. There has been minimal output from the percutaneous drainage catheter for the past several days and as such, the patient presents now for fluoroscopic guided drainage catheter injection prior to potential  removal. EXAM: SINUS TRACT INJECTION/FISTULOGRAM COMPARISON:  CT abdomen pelvis- 04/29/2017; 04/24/2017; CT-guided percutaneous drainage catheter placement - 04/25/2017 CONTRAST:  10 cc Isovue 300, administered via the existing percutaneous drainage catheter. FLUOROSCOPY TIME:  1 minute, 12 seconds (68 mGy) TECHNIQUE: The  patient was positioned supine on the fluoroscopy table. A preprocedural spot fluoroscopic image was obtained of the lower pelvis and existing percutaneous drainage catheter Multiple spot fluoroscopic and radiographic images were obtained following the injection of a small amount of contrast via the existing percutaneous drainage catheter. Images reviewed in the procedure was terminated. The drainage catheter was flushed with a small amount of saline and reconnected to a gravity bag. As was placed. The patient tolerated the procedure well without immediate postprocedural complication. FINDINGS: Preprocedural spot fluoroscopic image demonstrates unchanged positioning of left lower abdominal percutaneous drainage catheter Contrast injection demonstrates opacification of decompressed abscess cavity, however there is persistent fistulous connection between the decompressed abscess cavity in the adjacent sigmoid colon. IMPRESSION: Fistulous connection between the decompressed abscess cavity and the adjacent sigmoid colon. As such, the drainage catheter was NOT removed at this time. PLAN: - Patient was instructed to no longer flush the percutaneous drainage catheter. - Patient was instructed to maintain diligent records regarding drainage catheter output. - Patient should undergo drainage catheter injection in approximately 2 weeks. This could be done in the hospital (if the patient is still an inpatient), otherwise the injection should be performed at the interventional radiology drain Clinic [phone]). Electronically Signed   By: Sandi Mariscal M.D.   On: 05/02/2017 11:35    Labs:  CBC: Recent Labs    04/27/17 0443 04/29/17 0451 05/01/17 0902 05/03/17 0438  WBC 11.2* 9.3 7.6 7.1  HGB 7.7* 7.8* 7.8* 8.2*  HCT 22.3* 22.9* 23.1* 24.2*  PLT 125* 120* 122* 129*    COAGS: Recent Labs    09/27/16 1611 02/01/17 1254 04/25/17 1027  INR 0.99 0.78 1.15  APTT 27  --   --     BMP: Recent Labs     04/26/17 0449 04/27/17 0443 04/28/17 0411 04/29/17 0451 05/01/17 0902  NA 135 134*  --  134* 134*  K 3.6 3.8  --  3.7 3.8  CL 102 102  --  101 101  CO2 25 25  --  27 26  GLUCOSE 78 72  --  82 79  BUN 14 11  --  9 7  CALCIUM 9.6 9.4  --  9.6 9.3  CREATININE 0.97 0.89 0.88 0.88 0.82  GFRNONAA >60 >60 >60 >60 >60  GFRAA >60 >60 >60 >60 >60    LIVER FUNCTION TESTS: Recent Labs    04/25/17 0459 04/26/17 0449 04/27/17 0443 04/29/17 0451  BILITOT 4.3* 3.4* 2.8* 2.7*  AST 247* 258* 234* 205*  ALT 37 32 31 30  ALKPHOS 447* 446* 445* 492*  PROT 6.3* 5.6* 5.4* 5.7*  ALBUMIN 2.4* 2.0* 2.0* 2.1*    Assessment and Plan: s/p drainage of presumed divert abscess 12/31; afebrile; WBC nl; hgb 8.2; 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 Rowe Robert, PA-C 05/03/2017, 3:02 PM   I spent a total of  at the the patient's bedside AND on the patient's hospital floor or unit, greater than 50%  of which was counseling/coordinating care for pelvic abscess drain    Patient ID: Oneita Hurt, female   DOB: December 08, 1955, 63 y.o.   MRN: [id number]

## 2017-05-04 ENCOUNTER — Telehealth: Payer: Self-pay | Admitting: Medical Oncology

## 2017-05-04 DIAGNOSIS — R740 Nonspecific elevation of levels of transaminase and lactic acid dehydrogenase [LDH]: Secondary | ICD-10-CM

## 2017-05-04 DIAGNOSIS — J189 Pneumonia, unspecified organism: Secondary | ICD-10-CM

## 2017-05-04 DIAGNOSIS — L8990 Pressure ulcer of unspecified site, unspecified stage: Secondary | ICD-10-CM

## 2017-05-04 DIAGNOSIS — C3492 Malignant neoplasm of unspecified part of left bronchus or lung: Secondary | ICD-10-CM

## 2017-05-04 DIAGNOSIS — J449 Chronic obstructive pulmonary disease, unspecified: Secondary | ICD-10-CM

## 2017-05-04 DIAGNOSIS — Z9981 Dependence on supplemental oxygen: Secondary | ICD-10-CM

## 2017-05-04 DIAGNOSIS — Z9221 Personal history of antineoplastic chemotherapy: Secondary | ICD-10-CM

## 2017-05-04 MED ORDER — GLYCERIN (LAXATIVE) 2.1 G RE SUPP: 1.0000 | RECTAL | Status: AC | PRN

## 2017-05-04 MED ORDER — MIRTAZAPINE 15 MG PO TBDP: 7.5000 mg | ORAL_TABLET | Freq: Every day | ORAL | Status: AC

## 2017-05-04 MED ORDER — AMOXICILLIN-POT CLAVULANATE 875-125 MG PO TABS: 1.0000 | ORAL_TABLET | Freq: Two times a day (BID) | ORAL | 0 refills | Status: AC

## 2017-05-04 MED ORDER — FLORANEX PO PACK: 1.0000 g | PACK | Freq: Three times a day (TID) | ORAL | Status: AC

## 2017-05-04 MED ORDER — HEPARIN SOD (PORK) LOCK FLUSH 100 UNIT/ML IV SOLN
500.0000 [IU] | INTRAVENOUS | Status: AC | PRN
  Administered 2017-05-04: 500 [IU]

## 2017-05-04 NOTE — Progress Notes (Signed)
[facility] called for transport. ETA-unknown.  Kathrin Greathouse, Latanya Presser, MSW Clinical Social Worker  [phone] 05/04/2017  4:08 PM

## 2017-05-04 NOTE — Discharge Summary (Signed)
Physician Discharge Summary  Sharon [name] MAU:[id number] DOB: [birth year] DOA: 04/24/2017  PCP: [name], PA  Admit date: 04/24/2017 Discharge date: 05/04/2017  Time spent: 25 minutes  Recommendations for Outpatient Follow-up:  1. Repeat CBC to follow hemoglobin trend 2. Repeat basic metabolic panel to follow electrolytes and renal function   Discharge Diagnoses:  Principal Problem:   Diverticulitis of large intestine with perforation and abscess Active Problems:   COPD, severe ([place])   SCLC (small cell lung carcinoma), left ([name])   Transaminitis   Postobstructive pneumonia   History of antineoplastic chemotherapy   Maintenance antineoplastic immunotherapy   Dependence on supplemental oxygen   Liver metastasis ([place])   Abscess of abdominal cavity ([place])   Palliative care encounter   DNR (do not resuscitate)   Pressure injury of skin   Discharge Condition: Stable and improved.  Patient discharged to skilled nursing facility with palliative care follow-up for attempted rehabilitation and further care.  Diet recommendation: low residue diet   Filed Weights   04/25/17 2200  Weight: 82.2 kg (181 lb 3.5 oz)    History of present illness:  As per H&P written by Dr. [name] on 04/24/17. 62 y.o. female with medical history significant of SCLC with widespread metastatic disease.  Currently on immunotherapy.  Patient presents to the ED with c/o Abd pain.  Noted at 830am today.  Worse when laying down.  Sharp, dull, and cramping pain.  hasnt had BMs, tried stool softener.  Yesterday had hard BM, today had to disimpact self to have BM.  CP for 3 days, retrosternal superior chest.  Nothing makes better or worse.  Inhaler didn't help.  Just finished 2nd round of immunotherapy on xmas.  ED Course: WBC 19.8.  Alk phos 559, AST 290.  CT chest/abd/pelvis: Perforated diverticulitis with abscess with fistula, cancer is progressing.   Hospital Course:  1-acute diverticulitis of   sigmoid colon with perforation and abscess formation -Status post abscess drainage -Repeat CT scan demonstrated a fistula between decompress abscess cavity and the sigmoid colon.  Plan is to repeat injection into the drain couple of weeks from now. -Antibiotic has been switched to Augmentin p.o. based on culture sensitivity and the plan is to treat for another 12 days following oral route. -Patient will follow up with general surgery/drain clinic in 2 weeks.  2-metastatic non-small cell lung cancer: -With worsening disease. -At this particular moment Dr. [name] on 1 8/19 and states that there is no options for chemotherapy or immunotherapy at this time; palliative care will be reasonable.  3-presumed postoperative pneumonia -Patient continued to have improvement in her respiratory status -Plan is to continue Augmentin out by mouth as mentioned in problem #1 for another 12 days.  4-transaminitis -Most likely associated with a secondary metastatic disease in the liver. -Will continue to monitor CMP.  5-Social/ethics: -Appreciate palliative care services and inputs. -Plan is to have palliative care following patient at the facility -Patient will be discharge to a skilled nursing facility for rehabilitation and further care.  6-anemia of chronic disease -Asymptomatic. -Follow hemoglobin trend as an outpatient.  7-physical deconditioning -Continue plans to discharge to skilled nursing facility for further care and rehabilitation.  8-chronic respiratory failure 2-3 L nasal cannula supplementation) due to COPD. -`Continue to use oxygen supplementation and continue home inhalers/nebulizer regimen.  Procedures:  IR drainage of diverticular abscess on 12/34/18  See below for x-ray reports  Consultations:  Palliative care  General surgery  IR  Oncology service.  Discharge Exam:  Vitals:   05/04/17 0137 05/04/17 1313  BP: 126/70 (!) 118/57  Pulse: 95 (!) 102  Resp: 16 15   Temp: 98.3 F (36.8 C) 98.9 F (37.2 C)  SpO2: 95% 95%    General: Alert, awake and oriented x3; in no acute distress.  Patient is afebrile reporting just mild discomfort in her left lower quadrant. Cardiovascular: S1 and S2, no rubs, no gallops Respiratory: Good air movement bilaterally, wearing her chronic amount of oxygen supplementation, normal respiratory effort no using accessory muscles. Abdomen: Positive bowel sounds, abdomen was soft, nondistended, with some pain in her left lower quadrant during deep palpation. Psych: Appropriate affect, no suicidal ideation or hallucination Extremities: No swelling, no cyanosis.  Discharge Instructions   Discharge Instructions    Discharge instructions   Complete by:  As directed    Take medications as prescribed Follow low residue diet  Maintain adequate hydration Follow up with general surgery and IR as instructed     Allergies as of 05/04/2017   No Known Allergies     Medication List    STOP taking these medications   loperamide 2 MG capsule Commonly known as:  IMODIUM   LORazepam 0.5 MG tablet Commonly known as:  ATIVAN   methylPREDNISolone 4 MG tablet Commonly known as:  MEDROL     TAKE these medications   albuterol 108 (90 Base) MCG/ACT inhaler Commonly known as:  PROVENTIL HFA;VENTOLIN HFA Inhale 1 puff into the lungs every 6 (six) hours as needed for wheezing or shortness of breath.   amoxicillin-clavulanate 875-125 MG tablet Commonly known as:  AUGMENTIN Take 1 tablet by mouth every 12 (twelve) hours for 12 days.   budesonide-formoterol 160-4.5 MCG/ACT inhaler Commonly known as:  SYMBICORT Inhale 2 puffs into the lungs 2 (two) times daily.   COMBIVENT RESPIMAT 20-100 MCG/ACT Aers respimat Generic drug:  Ipratropium-Albuterol Inhale 1 puff into the lungs every 6 (six) hours.   ferrous sulfate 325 (65 FE) MG EC tablet Take 325 mg by mouth daily.   fluticasone 50 MCG/ACT nasal spray Commonly known as:   FLONASE Place 1 spray into both nostrils daily.   Glycerin (Adult) 2.1 g Supp Place 1 suppository rectally as needed for moderate constipation.   lactobacillus Pack Take 1 packet (1 g total) by mouth 3 (three) times daily with meals.   magnesium oxide 400 (241.3 Mg) MG tablet Commonly known as:  MAG-OX Take 1 tablet (400 mg total) by mouth 4 (four) times daily.   meclizine 12.5 MG tablet Commonly known as:  ANTIVERT Take 1-2 tablets (12.5-25 mg total) by mouth 3 (three) times daily as needed for dizziness.   mirtazapine 15 MG disintegrating tablet Commonly known as:  REMERON SOL-TAB Take 0.5 tablets (7.5 mg total) by mouth at bedtime.   montelukast 10 MG tablet Commonly known as:  SINGULAIR Take 10 mg by mouth at bedtime.   ondansetron 8 MG tablet Commonly known as:  ZOFRAN Take 1 tablet (8 mg total) every 8 (eight) hours as needed by mouth for nausea or vomiting.   polyethylene glycol powder powder Commonly known as:  GLYCOLAX/MIRALAX Take 1 Container by mouth daily.   potassium chloride 20 MEQ/15ML (10%) Soln Take 15 mLs (20 mEq total) by mouth 2 (two) times daily.   prochlorperazine 10 MG tablet Commonly known as:  COMPAZINE TAKE 1 TABLET BY MOUTH EVERY 6 HOURS AS NEEDED FOR NAUSEA AND VOMITING   SANTYL ointment Generic drug:  collagenase APP TOPICALLY UTD   senna 8.6 MG  Tabs tablet Commonly known as:  SENOKOT Take 1 tablet (8.6 mg total) by mouth daily.   sucralfate 1 GM/10ML suspension Commonly known as:  CARAFATE Take 10 mLs (1 g total) by mouth 4 (four) times daily -  with meals and at bedtime.   temozolomide 140 MG capsule Commonly known as:  TEMODAR Take 2 capsules (280 mg total) daily by mouth. Take for days 1-5 every 28 days. May take on empty stomach & at bedtime to decrease N&V.   UNABLE TO FIND Take 10 mLs by mouth daily as needed. Med Name: CBD Oil      No Known Allergies  Contact information for follow-up providers    Autumn Messing III, MD  Follow up in 2 week(s).   Specialty:  General Surgery Why:  Contact office with follow-up details Contact information: [address] [place] [address] [phone]            Contact information for after-discharge care    Destination    [facility] Follow up.   Service:  Skilled Nursing Contact information: [place] [phone]                  The results of significant diagnostics from this hospitalization (including imaging, microbiology, ancillary and laboratory) are listed below for reference.    Significant Diagnostic Studies: Dg Chest 2 View  Result Date: 04/24/2017 CLINICAL DATA:  Shortness of breath. All on immunotherapy for lung cancer. Ex-smoker. EXAM: CHEST  2 VIEW COMPARISON:  CT 03/09/2017.  Plain film of 01/09/2017. FINDINGS: Lateral view degraded by patient arm position. Right Port-A-Cath terminates at the low SVC. Midline trachea. Normal heart size. New small left pleural effusion. Probable treatment effects in the left upper lobe laterally. Clear right lung. Left base subsegmental atelectasis. IMPRESSION: New small left pleural effusion with adjacent left base subsegmental atelectasis. Electronically Signed   By: Abigail Miyamoto M.D.   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 History of palliative radiotherapy of an enlarging left upper lobe pulmonary nodule. Ongoing medical therapy. Patient presents with generalized abdominal pain and clinical concern for pulmonary embolism. EXAM: CT ANGIOGRAPHY CHEST CT ABDOMEN AND PELVIS WITH CONTRAST TECHNIQUE: Multidetector CT imaging of the chest was performed using the standard protocol during bolus administration of intravenous contrast. Multiplanar CT image reconstructions and MIPs were obtained to  evaluate the vascular anatomy. Multidetector CT imaging of the abdomen and pelvis was performed using the standard protocol during bolus administration of intravenous contrast. CONTRAST:  < 100 cc > ISOVUE-370 IOPAMIDOL (ISOVUE-370) INJECTION 76% COMPARISON:  03/09/2017 CT chest and abdomen. Chest radiograph from earlier today. 10/15/2016 CT abdomen/pelvis. FINDINGS: CTA CHEST FINDINGS Cardiovascular: The study is moderate quality for the evaluation of pulmonary embolism, with evaluation of the subsegmental vessels limited by motion. There are no filling defects in the central, lobar, segmental or subsegmental pulmonary artery branches to suggest acute pulmonary embolism. Mildly atherosclerotic nonaneurysmal thoracic aorta. Normal caliber pulmonary arteries. Normal heart size. No significant pericardial fluid/thickening. Right internal jugular MediPort terminates in the right atrium. Mediastinum/Nodes: No discrete thyroid nodules. Unremarkable esophagus. No axillary adenopathy. Newly enlarged 2.0 cm right supraclavicular node (series 7/image 8). Increased right paratracheal adenopathy measuring up to 2.1 cm (series 7/ image 24), previously 1.2 cm. Increased prevascular bilateral mediastinal adenopathy measuring up  to 2.0 cm on the right (series 7/ image 26), previously 1.3 cm. Increased 1.9 cm enlarged subcarinal node (series 7/ image 42), previously 1.1 cm. Newly enlarged 1.8 cm AP window node (series 7/ image 35). Newly moderate infiltrative left hilar adenopathy measuring up to 2.0 cm (series 7/ image 48), with associated extrinsic narrowing of segmental lingular and left lower lobe airways. No right hilar adenopathy. Lungs/Pleura: No pneumothorax. No right pleural effusion. New small dependent left pleural effusion. Mild centrilobular emphysema with mild diffuse bronchial wall thickening. Stable bandlike consolidation in the anterior left upper lobe compatible with radiation fibrosis. Stable irregular solid 1.5  cm left upper lobe pulmonary nodule (series 13/image 40). New patchy consolidation, parenchymal banding and nodularity in the posterior left upper lobe and anterior left lower lobe. A few new scattered solid pulmonary nodules in the right lung, largest 6 mm in the anterior right middle lobe (series 13/ image 76). Musculoskeletal: No aggressive appearing focal osseous lesions. Moderate thoracic spondylosis. Review of the MIP images confirms the above findings. CT ABDOMEN and PELVIS FINDINGS Hepatobiliary: New hepatomegaly. Innumerable hypoenhancing masses replacing much of the liver, significantly increased in size and number, for example a 2.0 cm superior liver mass (series 4/ image 15), increased from 0.7 cm. Normal gallbladder with no radiopaque cholelithiasis. No biliary ductal dilatation. Pancreas: Normal, with no mass or duct dilation. Spleen: Normal size. No mass. Adrenals/Urinary Tract: Newly apparent 1.4 cm left adrenal nodule with density 64 HU. No right adrenal nodule. No hydronephrosis. Subcentimeter hypodense right renal cortical lesion is too small to characterize and is stable. No new renal lesions. Normal bladder. Stomach/Bowel: Normal non-distended stomach. Normal appendix. Mild sigmoid diverticulosis. There is a gas containing 6.0 x 4.9 x 7.2 cm multilocular abscess in the ventral left lower abdomen (series 4/image 66) with thick enhancing wall, which appears to demonstrate a fistulous connection to the proximal sigmoid colon (series 15/image 70), and which is intimately associated with multiple small bowel loops. No small bowel dilatation. No focal small bowel caliber transition. No definite small bowel wall thickening. Vascular/Lymphatic: Atherosclerotic nonaneurysmal abdominal aorta. Patent portal, splenic, hepatic and renal veins. No pathologically enlarged lymph nodes in the abdomen or pelvis. Reproductive: Status post hysterectomy, with no abnormal findings at the vaginal cuff. No adnexal  mass. Other: No ascites. Musculoskeletal: Previously visualized faintly sclerotic lesions throughout the lumbar vertebral bodies are not discretely visualized on today's scan. Mild lumbar spondylosis. Review of the MIP images confirms the above findings. IMPRESSION: 1. No evidence of pulmonary embolism. 2. Complex gas-containing multilocular abscess in the anterior left lower abdomen with fistulous connection to the proximal sigmoid colon (suggesting the sequela of perforated diverticulitis), intimately associated with multiple small bowel loops. No evidence of bowel obstruction. 3. Significant progression of metastatic disease. Right supraclavicular, bilateral mediastinal and left hilar adenopathy is significantly increased. 4. New extrinsic narrowing of segmental left upper and left lower lobe airways by the infiltrative left hilar adenopathy. New patchy consolidation and nodularity in the posterior left upper lobe and anterior left lower lobe, favor a combination of postobstructive pneumonia and tumor. New scattered probable small pulmonary metastases in the right lung. 5. Liver metastases are significantly increased in size and number with new hepatomegaly. 6. New left adrenal metastasis. 7. New small dependent left pleural effusion. 8. Aortic Atherosclerosis (ICD10-I70.0) and Emphysema (ICD10-J43.9). Electronically Signed   By: Ilona Sorrel M.D.   On: 04/24/2017 18:01   Ct Abdomen Pelvis W Contrast  Result Date: 04/30/2017 CLINICAL DATA:  62 year old  female with a history of extensive stage small cell lung carcinoma including brain metastases and a recent diverticular abscess treated with percutaneous drain placement on 04/25/2017. New onset right lower quadrant pain. EXAM: CT ABDOMEN AND PELVIS WITH CONTRAST TECHNIQUE: Multidetector CT imaging of the abdomen and pelvis was performed using the standard protocol following bolus administration of intravenous contrast. CONTRAST:  127m ISOVUE-300 IOPAMIDOL  (ISOVUE-300) INJECTION 61% COMPARISON:  Prior CT scan of the abdomen and pelvis 04/24/2017; CT drain placement 04/25/2017 FINDINGS: Lower chest: Enlarged and now moderate simple layering left pleural effusion with associated left lower lobe atelectasis. New small right-sided pleural effusion also with associated subsegmental atelectasis. The heart remains normal in size. Incompletely imaged port catheter tip in the upper right atrium. No pericardial effusion. Unremarkable distal thoracic esophagus. Hepatobiliary: Innumerable hypoechoic lesions scattered throughout the liver consistent with extensive metastatic disease. No significant interval change compared to recent prior imaging. Gallbladder is unremarkable. No intra or extrahepatic biliary ductal dilatation. Pancreas: Unremarkable. No pancreatic ductal dilatation or surrounding inflammatory changes. Spleen: Normal. Adrenals/Urinary Tract: Stable left adrenal nodule. Unremarkable right adrenal gland. The kidneys are normal in appearance. No hydronephrosis or nephrolithiasis. Unremarkable ureters and bladder. Stomach/Bowel: Moderate size rectal stool ball measuring 5 x 4.6 cm. There is a mild thickening of the adjacent rectal wall. Scattered sigmoid diverticula. Decreased inflammation. Small soft tissue tracks continues to extend from the region of the sigmoid colon to the prior abscess cavity. The abscess cavity is completely decompressed. The drainage catheter remains in good position. Normal appendix in a retrocecal position. No evidence of bowel obstruction. Vascular/Lymphatic: Atherosclerotic plaque in the abdominal aorta. No aneurysm or dissection. No significant lymphadenopathy. Reproductive: Status post hysterectomy. No adnexal masses. Other: Small volume perihepatic an intra mesenteric ascites. No loculation or gas to suggest active infection. No abdominal wall hernia. Musculoskeletal: No acute or significant osseous findings. IMPRESSION: 1.  Well-positioned drainage catheter with complete resolution of the previously noted diverticular abscess. 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 Recommended drain injection prior to removal. 2. No new abscess or acute intraabdominal abnormality. 3. Moderate volume of formed stool including a 5 cm rectal stool ball suggests constipation. 4. Slightly increased but still minimal abdominal ascites which is likely malignant or related to the diffuse hepatic metastatic disease. 5. New small right sided pleural effusion and enlarged moderate left-sided pleural effusion. Electronically Signed   By: HJacqulynn CadetM.D.   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 History of palliative radiotherapy of an enlarging left upper lobe pulmonary nodule. Ongoing medical therapy. Patient presents with generalized abdominal pain and clinical concern for pulmonary embolism. EXAM: CT ANGIOGRAPHY CHEST CT ABDOMEN AND PELVIS WITH CONTRAST TECHNIQUE: Multidetector CT imaging of the chest was performed using the standard protocol during bolus administration of intravenous contrast. Multiplanar CT image reconstructions and MIPs were obtained to evaluate the vascular anatomy. Multidetector CT imaging of the abdomen and pelvis was performed using the standard protocol during bolus administration of intravenous contrast. CONTRAST:  < 100 cc > ISOVUE-370 IOPAMIDOL (ISOVUE-370) INJECTION 76% COMPARISON:  03/09/2017 CT chest and abdomen. Chest radiograph from earlier today. 10/15/2016 CT abdomen/pelvis. FINDINGS: CTA CHEST FINDINGS Cardiovascular: The study is moderate quality for the evaluation of pulmonary embolism, with evaluation of the  subsegmental vessels limited by motion.  There are no filling defects in the central, lobar, segmental or subsegmental pulmonary artery branches to suggest acute pulmonary embolism. Mildly atherosclerotic nonaneurysmal thoracic aorta. Normal caliber pulmonary arteries. Normal heart size. No significant pericardial fluid/thickening. Right internal jugular MediPort terminates in the right atrium. Mediastinum/Nodes: No discrete thyroid nodules. Unremarkable esophagus. No axillary adenopathy. Newly enlarged 2.0 cm right supraclavicular node (series 7/image 8). Increased right paratracheal adenopathy measuring up to 2.1 cm (series 7/ image 24), previously 1.2 cm. Increased prevascular bilateral mediastinal adenopathy measuring up to 2.0 cm on the right (series 7/ image 26), previously 1.3 cm. Increased 1.9 cm enlarged subcarinal node (series 7/ image 42), previously 1.1 cm. Newly enlarged 1.8 cm AP window node (series 7/ image 35). Newly moderate infiltrative left hilar adenopathy measuring up to 2.0 cm (series 7/ image 48), with associated extrinsic narrowing of segmental lingular and left lower lobe airways. No right hilar adenopathy. Lungs/Pleura: No pneumothorax. No right pleural effusion. New small dependent left pleural effusion. Mild centrilobular emphysema with mild diffuse bronchial wall thickening. Stable bandlike consolidation in the anterior left upper lobe compatible with radiation fibrosis. Stable irregular solid 1.5 cm left upper lobe pulmonary nodule (series 13/image 40). New patchy consolidation, parenchymal banding and nodularity in the posterior left upper lobe and anterior left lower lobe. A few new scattered solid pulmonary nodules in the right lung, largest 6 mm in the anterior right middle lobe (series 13/ image 76). Musculoskeletal: No aggressive appearing focal osseous lesions. Moderate thoracic spondylosis. Review of the MIP images confirms the above findings. CT ABDOMEN and PELVIS FINDINGS Hepatobiliary: New hepatomegaly. Innumerable  hypoenhancing masses replacing much of the liver, significantly increased in size and number, for example a 2.0 cm superior liver mass (series 4/ image 15), increased from 0.7 cm. Normal gallbladder with no radiopaque cholelithiasis. No biliary ductal dilatation. Pancreas: Normal, with no mass or duct dilation. Spleen: Normal size. No mass. Adrenals/Urinary Tract: Newly apparent 1.4 cm left adrenal nodule with density 64 HU. No right adrenal nodule. No hydronephrosis. Subcentimeter hypodense right renal cortical lesion is too small to characterize and is stable. No new renal lesions. Normal bladder. Stomach/Bowel: Normal non-distended stomach. Normal appendix. Mild sigmoid diverticulosis. There is a gas containing 6.0 x 4.9 x 7.2 cm multilocular abscess in the ventral left lower abdomen (series 4/image 66) with thick enhancing wall, which appears to demonstrate a fistulous connection to the proximal sigmoid colon (series 15/image 70), and which is intimately associated with multiple small bowel loops. No small bowel dilatation. No focal small bowel caliber transition. No definite small bowel wall thickening. Vascular/Lymphatic: Atherosclerotic nonaneurysmal abdominal aorta. Patent portal, splenic, hepatic and renal veins. No pathologically enlarged lymph nodes in the abdomen or pelvis. Reproductive: Status post hysterectomy, with no abnormal findings at the vaginal cuff. No adnexal mass. Other: No ascites. Musculoskeletal: Previously visualized faintly sclerotic lesions throughout the lumbar vertebral bodies are not discretely visualized on today's scan. Mild lumbar spondylosis. Review of the MIP images confirms the above findings. IMPRESSION: 1. No evidence of pulmonary embolism. 2. Complex gas-containing multilocular abscess in the anterior left lower abdomen with fistulous connection to the proximal sigmoid colon (suggesting the sequela of perforated diverticulitis), intimately associated with multiple small  bowel loops. No evidence of bowel obstruction. 3. Significant progression of metastatic disease. Right supraclavicular, bilateral mediastinal and left hilar adenopathy is significantly increased. 4. New extrinsic narrowing of segmental left upper and left lower lobe airways by the infiltrative left hilar adenopathy. New  patchy consolidation and nodularity in the posterior left upper lobe and anterior left lower lobe, favor a combination of postobstructive pneumonia and tumor. New scattered probable small pulmonary metastases in the right lung. 5. Liver metastases are significantly increased in size and number with new hepatomegaly. 6. New left adrenal metastasis. 7. New small dependent left pleural effusion. 8. Aortic Atherosclerosis (ICD10-I70.0) and Emphysema (ICD10-J43.9). Electronically Signed   By: Ilona Sorrel M.D.   On: 04/24/2017 18:01   Ir Sinus/fist Tube Chk-non Gi  Result Date: 05/02/2017 CLINICAL DATA:  History of metastatic small cell lung cancer with findings worrisome for advanced metastatic disease. Unfortunately, patient developed concomitant presumed diverticular abscess within the ventral aspect of the lower abdomen for which the patient underwent successful CT-guided percutaneous drainage catheter placement on 04/25/2017. Subsequent abdominal CT performed 04/29/2017 demonstrates complete resolution of the diverticular abscess. There has been minimal output from the percutaneous drainage catheter for the past several days and as such, the patient presents now for fluoroscopic guided drainage catheter injection prior to potential removal. EXAM: SINUS TRACT INJECTION/FISTULOGRAM COMPARISON:  CT abdomen pelvis- 04/29/2017; 04/24/2017; CT-guided percutaneous drainage catheter placement - 04/25/2017 CONTRAST:  10 cc Isovue 300, administered via the existing percutaneous drainage catheter. FLUOROSCOPY TIME:  1 minute, 12 seconds (68 mGy) TECHNIQUE: The patient was positioned supine on the fluoroscopy  table. A preprocedural spot fluoroscopic image was obtained of the lower pelvis and existing percutaneous drainage catheter Multiple spot fluoroscopic and radiographic images were obtained following the injection of a small amount of contrast via the existing percutaneous drainage catheter. Images reviewed in the procedure was terminated. The drainage catheter was flushed with a small amount of saline and reconnected to a gravity bag. As was placed. The patient tolerated the procedure well without immediate postprocedural complication. FINDINGS: Preprocedural spot fluoroscopic image demonstrates unchanged positioning of left lower abdominal percutaneous drainage catheter Contrast injection demonstrates opacification of decompressed abscess cavity, however there is persistent fistulous connection between the decompressed abscess cavity in the adjacent sigmoid colon. IMPRESSION: Fistulous connection between the decompressed abscess cavity and the adjacent sigmoid colon. As such, the drainage catheter was NOT removed at this time. PLAN: - Patient was instructed to no longer flush the percutaneous drainage catheter. - Patient was instructed to maintain diligent records regarding drainage catheter output. - Patient should undergo drainage catheter injection in approximately 2 weeks. This could be done in the hospital (if the patient is still an inpatient), otherwise the injection should be performed at the interventional radiology drain Clinic [phone]). Electronically Signed   By: Sandi Mariscal M.D.   On: 05/02/2017 11:35   Ct Image Guided Drainage By Percutaneous Catheter  Result Date: 04/25/2017 INDICATION: History of metastatic small cell lung cancer with findings worrisome for advanced metastatic disease. Unfortunately, patient has developed a concomitant presumed diverticular abscess within the ventral aspect of the left lower abdomen. Given poor operative candidacy, request made for CT-guided percutaneous  drainage catheter placement for infection source control purposes. EXAM: CT IMAGE GUIDED DRAINAGE BY PERCUTANEOUS CATHETER COMPARISON:  CT abdomen and pelvis - 04/24/2017 MEDICATIONS: The patient is currently admitted to the hospital and receiving intravenous antibiotics. The antibiotics were administered within an appropriate time frame prior to the initiation of the procedure. ANESTHESIA/SEDATION: Moderate (conscious) sedation was employed during this procedure. A total of Versed 2 mg and Fentanyl 100 mcg was administered intravenously. Moderate Sedation Time: 13 minutes. The patient's level of consciousness and vital signs were monitored continuously by radiology nursing throughout the procedure under  my direct supervision. CONTRAST:  None COMPLICATIONS: None immediate. PROCEDURE: Informed written consent was obtained from the patient after a discussion of the risks, benefits and alternatives to treatment. The patient was placed supine on the CT gantry and a pre procedural CT was performed re-demonstrating the known abscess/fluid collection within the ventral aspect of the left lower abdomen/ pelvis with dominant air in fluid containing component measuring approximately 6.8 x 4.7 cm (image 18, series 2). The procedure was planned. A timeout was performed prior to the initiation of the procedure. The skin overlying the ventral aspect of the left lower abdomen was prepped and draped in the usual sterile fashion. The overlying soft tissues were anesthetized with 1% lidocaine with epinephrine. Appropriate trajectory was planned with the use of a 22 gauge spinal needle. An 18 gauge trocar needle was advanced into the abscess/fluid collection and a short Amplatz super stiff wire was coiled within the collection. Appropriate positioning was confirmed with a limited CT scan. The tract was serially dilated allowing placement of a 10 [place] all-purpose drainage catheter. Appropriate positioning was confirmed with a limited  postprocedural CT scan. Approximately 80 Ml of purulent fluid was aspirated. The tube was connected to a drainage bag and sutured in place. A dressing was placed. The patient tolerated the procedure well without immediate post procedural complication. IMPRESSION: Successful CT guided placement of a 10 French all purpose drain catheter into the presumed diverticular abscess within the ventral aspect the left lower abdomen with aspiration of 80 mL of purulent fluid. Samples were sent to the laboratory as requested by the ordering clinical team. Electronically Signed   By: Sandi Mariscal M.D.   On: 04/25/2017 14:55    Microbiology: Recent Results (from the past 240 hour(s))  Blood culture (routine x 2)     Status: None   Collection Time: 04/24/17  5:00 PM  Result Value Ref Range Status   Specimen Description BLOOD CHEST PORTA CATH  Final   Special Requests   Final    BOTTLES DRAWN AEROBIC AND ANAEROBIC Blood Culture adequate volume   Culture   Final    NO GROWTH 5 DAYS Performed at [facility], [address], [place], [place] [address]    Report Status 04/29/2017 FINAL  Final  Blood culture (routine x 2)     Status: None   Collection Time: 04/24/17  5:45 PM  Result Value Ref Range Status   Specimen Description BLOOD CHEST PORTA CATH  Final   Special Requests   Final    BOTTLES DRAWN AEROBIC AND ANAEROBIC Blood Culture results may not be optimal due to an excessive volume of blood received in culture bottles   Culture   Final    NO GROWTH 5 DAYS Performed at [facility], [place] [address], [place], [place] [address]    Report Status 04/29/2017 FINAL  Final  Urine culture     Status: None   Collection Time: 04/24/17  9:18 PM  Result Value Ref Range Status   Specimen Description URINE, CATHETERIZED  Final   Special Requests NONE  Final   Culture   Final    NO GROWTH Performed at [facility], [place] [address] [place], [place] [address]    Report Status 04/26/2017 FINAL   Final  MRSA PCR Screening     Status: None   Collection Time: 04/25/17 12:50 PM  Result Value Ref Range Status   MRSA by PCR NEGATIVE NEGATIVE Final  Comment:        The GeneXpert MRSA Assay (FDA approved for NASAL specimens only), is one component of a comprehensive MRSA colonization surveillance program. It is not intended to diagnose MRSA infection nor to guide or monitor treatment for MRSA infections.   Aerobic/Anaerobic Culture (surgical/deep wound)     Status: None   Collection Time: 04/25/17  2:55 PM  Result Value Ref Range Status   Specimen Description ABSCESS  Final   Special Requests Normal  Final   Gram Stain   Final    ABUNDANT WBC PRESENT,BOTH PMN AND MONONUCLEAR ABUNDANT GRAM POSITIVE COCCI IN PAIRS MODERATE GRAM VARIABLE ROD Performed at [facility], [place] [address], [place], [place] [address]    Culture   Final    ABUNDANT VIRIDANS STREPTOCOCCUS RARE ESCHERICHIA COLI MIXED ANAEROBIC FLORA PRESENT.  CALL LAB IF FURTHER IID REQUIRED.    Report Status 04/30/2017 FINAL  Final   Organism ID, Bacteria VIRIDANS STREPTOCOCCUS  Final   Organism ID, Bacteria ESCHERICHIA COLI  Final      Susceptibility   Escherichia coli - MIC*    AMPICILLIN 16 INTERMEDIATE Intermediate     CEFAZOLIN <=4 SENSITIVE Sensitive     CEFEPIME <=1 SENSITIVE Sensitive     CEFTAZIDIME <=1 SENSITIVE Sensitive     CEFTRIAXONE <=1 SENSITIVE Sensitive     CIPROFLOXACIN >=4 RESISTANT Resistant     GENTAMICIN <=1 SENSITIVE Sensitive     IMIPENEM <=0.25 SENSITIVE Sensitive     TRIMETH/SULFA <=20 SENSITIVE Sensitive     AMPICILLIN/SULBACTAM 4 SENSITIVE Sensitive     PIP/TAZO <=4 SENSITIVE Sensitive     Extended ESBL NEGATIVE Sensitive     * RARE ESCHERICHIA COLI   Viridans streptococcus - MIC*    PENICILLIN <=0.06 SENSITIVE Sensitive     ERYTHROMYCIN <=0.12 SENSITIVE Sensitive     LEVOFLOXACIN 0.5 SENSITIVE Sensitive     VANCOMYCIN 0.5 SENSITIVE Sensitive     * ABUNDANT VIRIDANS  STREPTOCOCCUS     Labs: Basic Metabolic Panel: Recent Labs  Lab 04/28/17 0411 04/29/17 0451 05/01/17 0902  NA  --  134* 134*  K  --  3.7 3.8  CL  --  101 101  CO2  --  27 26  GLUCOSE  --  82 79  BUN  --  9 7  CREATININE 0.88 0.88 0.82  CALCIUM  --  9.6 9.3   Liver Function Tests: Recent Labs  Lab 04/29/17 0451  AST 205*  ALT 30  ALKPHOS 492*  BILITOT 2.7*  PROT 5.7*  ALBUMIN 2.1*   CBC: Recent Labs  Lab 04/29/17 0451 05/01/17 0902 05/03/17 0438  WBC 9.3 7.6 7.1  HGB 7.8* 7.8* 8.2*  HCT 22.9* 23.1* 24.2*  MCV 106.0* 109.0* 110.0*  PLT 120* 122* 129*    Signed:  Barton Dubois MD.  [facility] 05/04/2017, 2:54 PM

## 2017-05-04 NOTE — Telephone Encounter (Signed)
Asking when is her next appt with [facility]. Pt going to [facility] today . Appt on chart for 1/15.

## 2017-05-04 NOTE — Telephone Encounter (Signed)
Next appt on jan 15th-lab at 1215 , flush at 1pm and Mohamed at 2 pm. Pt aware. I told her these dates should be on her discharge papers

## 2017-05-04 NOTE — Progress Notes (Signed)
CC:  Diverticulitis   Subjective: No real change she does have some fluid in the bag this morning from the IR drain.  Cloudy serosanguineous fluid.  No change in her discomfort level.  Awaiting transfer to skilled nursing facility.  Objective: Vital signs in last 24 hours: Temp:  [98.1 F (36.7 C)-98.8 F (37.1 C)] 98.3 F (36.8 C) (01/09 0137) Pulse Rate:  [85-106] 95 (01/09 0137) Resp:  [16] 16 (01/09 0137) BP: (118-126)/(60-70) 126/70 (01/09 0137) SpO2:  [94 %-97 %] 95 % (01/09 0137) Last BM Date: 05/03/17 Nothing PO recorded 1800 IV 4 urine recordd Drain - 0 Stool x 2 Afebrile, VSS WBC is stable  Intake/Output from previous day: 01/08 0701 - 01/09 0700 In: 1800 [I.V.:1800] Out: 6 [Urine:4; Stool:2] Intake/Output this shift: No intake/output data recorded.  General appearance: alert, cooperative and no distress GI: Soft, little sore but otherwise stable.  Lab Results:  Recent Labs    05/03/17 0438  WBC 7.1  HGB 8.2*  HCT 24.2*  PLT 129*    BMET No results for input(s): NA, K, CL, CO2, GLUCOSE, BUN, CREATININE, CALCIUM in the last 72 hours. PT/INR No results for input(s): LABPROT, INR in the last 72 hours.  Recent Labs  Lab 04/29/17 0451  AST 205*  ALT 30  ALKPHOS 492*  BILITOT 2.7*  PROT 5.7*  ALBUMIN 2.1*     Lipase     Component Value Date/Time   LIPASE 20 04/24/2017 1544     Medications: . amoxicillin-clavulanate  1 tablet Oral Q12H  . ferrous sulfate  325 mg Oral Daily  . fluticasone  1 spray Each Nare Daily  . fluticasone furoate-vilanterol  1 puff Inhalation Daily  . ipratropium-albuterol  3 mL Nebulization BID  . lactobacillus  1 g Oral TID WC  . magnesium oxide  400 mg Oral QID  . mirtazapine  7.5 mg Oral QHS  . montelukast  10 mg Oral QHS  . polyethylene glycol  17 g Oral Daily  . potassium chloride  20 mEq Oral BID  . senna  2 tablet Oral BID  . sodium chloride flush  5 mL Intravenous Q8H   . sodium chloride 50  mL/hr at 05/04/17 0540   Anti-infectives (From admission, onward)   Start     Dose/Rate Route Frequency Ordered Stop   05/03/17 2215  amoxicillin-clavulanate (AUGMENTIN) 875-125 MG per tablet 1 tablet     1 tablet Oral Every 12 hours 05/03/17 2208     04/25/17 1800  vancomycin (VANCOCIN) IVPB 1000 mg/200 mL premix     1,000 mg 200 mL/hr over 60 Minutes Intravenous Every 24 hours 04/24/17 2024 04/27/17 2200   04/24/17 2359  piperacillin-tazobactam (ZOSYN) IVPB 3.375 g  Status:  Discontinued     3.375 g 12.5 mL/hr over 240 Minutes Intravenous Every 8 hours 04/24/17 2024 05/03/17 2208   04/24/17 1715  piperacillin-tazobactam (ZOSYN) IVPB 3.375 g     3.375 g 100 mL/hr over 30 Minutes Intravenous  Once 04/24/17 1714 04/24/17 1815   04/24/17 1715  vancomycin (VANCOCIN) 1,500 mg in sodium chloride 0.9 % 500 mL IVPB     1,500 mg 250 mL/hr over 120 Minutes Intravenous  Once 04/24/17 1714 04/24/17 2010      Assessment/Plan Diverticulitis with perforation and abscess formation. Sigmoid colon fistula abscess cavity IR drain placement 04/25/2017  Metastatic small cell lung cancer Possible postoperative pneumonia Transaminitis  FEN:IV fluids/soft diet AT:[facility] 12/30 - 05/03/17;  Augmentin  05/03/17 =>> day 2  DVT: SCD's/anemia Foley: None Follow up: Dr. [facility]man [facility]  Plan: IR recommended repeat injection the drain in 2 weeks.  Patient has been converted to oral antibiotics and we would keep her on a probiotic also.       LOS: 10 days    Chayanne Speir 05/04/2017 [phone]

## 2017-05-04 NOTE — Clinical Social Work Placement (Addendum)
Authorization received. Patient able to transfer to SNF if medically ready.  D/C Summary sent.  PTAR to transport. Nurse given number for report.   CLINICAL SOCIAL WORK PLACEMENT  NOTE  Date:  05/04/2017  Patient Details  Name: Sharon [name] MRN: [id number] Date of Birth: 05-Mar-1956  Clinical Social Work is seeking post-discharge placement for this patient at the [place] level of care (*CSW will initial, date and re-position this form in  chart as items are completed):  Yes   Patient/family provided with [place] Work Department's list of facilities offering this level of care within the geographic area requested by the patient (or if unable, by the patient's family).  Yes   Patient/family informed of their freedom to choose among providers that offer the needed level of care, that participate in Medicare, Medicaid or managed care program needed by the patient, have an available bed and are willing to accept the patient.  Yes   Patient/family informed of [facility]'s ownership interest in [facility] and [facility], as well as of the fact that they are under no obligation to receive care at these facilities.  PASRR submitted to EDS on       PASRR number received on       Existing PASRR number confirmed on 05/04/17     FL2 transmitted to all facilities in geographic area requested by pt/family on       FL2 transmitted to all facilities within larger geographic area on 05/04/17     Patient informed that his/her managed care company has contracts with or will negotiate with certain facilities, including the following:        Yes   Patient/family informed of bed offers received.  Patient chooses bed at [facility]     Physician recommends and patient chooses bed at      Patient to be transferred to [facility] on 05/04/17.  Patient to be transferred to facility by PTAR      Patient family notified on 05/04/17 of transfer.  Name  of family member notified:  Patient will notify family.      PHYSICIAN Please sign DNR, Please prepare priority discharge summary, including medications, Please prepare prescriptions     Additional Comment:    _______________________________________________ Lia Hopping, LCSW 05/04/2017, 12:31 PM

## 2017-05-04 NOTE — Progress Notes (Signed)
Physical Therapy Treatment Patient Details Name: Sharon [name] MRN: [id number] DOB: 1956-02-05 Today's Date: 05/04/2017    History of Present Illness Patient is a 62 year old female with past medical history of small cell lung cancer with widespread metastatic disease.  Currently on immunotherapy.  Patient presented to the emergency department with complaints of abdominal pain.  CT chest/abdomen/pelvis done in the emergency department showed perforated diverticulitis with abscess with fistula and progression of cancer. S/p IR image guided drainage on 04/25/17    PT Comments    Pt in bed on 2 lts nasal.  Assisted OOB to amb a greater distance in hallway avg O2 sats on 2 lts was 90% with noted 2/4 dyspnea.  Assisted to bathroom then pt requested back to bed.    Follow Up Recommendations  SNF     Equipment Recommendations  None recommended by PT    Recommendations for Other Services       Precautions / Restrictions Precautions Precaution Comments: monitor sats  Restrictions Weight Bearing Restrictions: No    Mobility  Bed Mobility Overal bed mobility: Needs Assistance Bed Mobility: Supine to Sit;Sit to Supine     Supine to sit: Supervision Sit to supine: Supervision   General bed mobility comments: increased time and assist with covers  Transfers Overall transfer level: Needs assistance Equipment used: Rolling walker (2 wheeled);None Transfers: Sit to/from Stand Sit to Stand: Supervision         General transfer comment: good use of hands to steady self    Also toilet transfers  Ambulation/Gait Ambulation/Gait assistance: Supervision Ambulation Distance (Feet): 185 Feet Assistive device: Rolling walker (2 wheeled);None Gait Pattern/deviations: Step-through pattern;Trunk flexed Gait velocity: decreased   General Gait Details: used walker just safety/balance vs having pt push IV pole.  Also amb without walker in room as pt held to bed and door frame.  Pt feeling  slightly weak.  Remqined on 2 lts sats decreased from 98% at rest to 90% with 2/4 dyspnea.     Stairs            Wheelchair Mobility    Modified Rankin (Stroke Patients Only)       Balance                                            Cognition Arousal/Alertness: Awake/alert Behavior During Therapy: WFL for tasks assessed/performed Overall Cognitive Status: Within Functional Limits for tasks assessed                                        Exercises      General Comments        Pertinent Vitals/Pain Pain Assessment: Faces Faces Pain Scale: Hurts little more Pain Location: ABD near drain Pain Descriptors / Indicators: Aching;Discomfort Pain Intervention(s): Monitored during session    Home Living                      Prior Function            PT Goals (current goals can now be found in the care plan section) Progress towards PT goals: Progressing toward goals    Frequency    Min 3X/week      PT Plan Current plan remains appropriate    Co-evaluation  AM-PAC PT "6 Clicks" Daily Activity  Outcome Measure  Difficulty turning over in bed (including adjusting bedclothes, sheets and blankets)?: Unable Difficulty moving from lying on back to sitting on the side of the bed? : Unable Difficulty sitting down on and standing up from a chair with arms (e.g., wheelchair, bedside commode, etc,.)?: Unable Help needed moving to and from a bed to chair (including a wheelchair)?: Total Help needed walking in hospital room?: Total Help needed climbing 3-5 steps with a railing? : Total 6 Click Score: 6    End of Session Equipment Utilized During Treatment: Oxygen Activity Tolerance: Patient tolerated treatment well;Patient limited by pain Patient left: in bed;with call bell/phone within reach;with family/visitor present Nurse Communication: Mobility status PT Visit Diagnosis: Unsteadiness on feet  (R26.81)     Time: 8756-4332 PT Time Calculation (min) (ACUTE ONLY): 25 min  Charges:  $Gait Training: 8-22 mins $Therapeutic Activity: 8-22 mins                    G Codes:       {Riggs Dineen  PTA [facility]  Acute  Rehab Pager      [phone]

## 2017-05-05 ENCOUNTER — Other Ambulatory Visit: Payer: Self-pay | Admitting: General Surgery

## 2017-05-05 DIAGNOSIS — K5720 Diverticulitis of large intestine with perforation and abscess without bleeding: Secondary | ICD-10-CM

## 2017-05-06 ENCOUNTER — Other Ambulatory Visit: Payer: 59

## 2017-05-09 ENCOUNTER — Telehealth: Payer: Self-pay | Admitting: Medical Oncology

## 2017-05-09 NOTE — Telephone Encounter (Signed)
Appointment confirmed

## 2017-05-10 ENCOUNTER — Encounter: Payer: Self-pay | Admitting: Internal Medicine

## 2017-05-10 ENCOUNTER — Inpatient Hospital Stay: Payer: 59 | Attending: Internal Medicine | Admitting: Internal Medicine

## 2017-05-10 ENCOUNTER — Inpatient Hospital Stay: Payer: 59

## 2017-05-10 VITALS — BP 107/47 | HR 97 | Temp 98.3°F | Resp 18 | Ht 67.0 in | Wt 189.0 lb

## 2017-05-10 DIAGNOSIS — Z515 Encounter for palliative care: Secondary | ICD-10-CM

## 2017-05-10 DIAGNOSIS — C3492 Malignant neoplasm of unspecified part of left bronchus or lung: Secondary | ICD-10-CM

## 2017-05-10 DIAGNOSIS — Z95828 Presence of other vascular implants and grafts: Secondary | ICD-10-CM

## 2017-05-10 DIAGNOSIS — C3412 Malignant neoplasm of upper lobe, left bronchus or lung: Secondary | ICD-10-CM | POA: Insufficient documentation

## 2017-05-10 DIAGNOSIS — C7931 Secondary malignant neoplasm of brain: Secondary | ICD-10-CM | POA: Diagnosis not present

## 2017-05-10 DIAGNOSIS — K5720 Diverticulitis of large intestine with perforation and abscess without bleeding: Secondary | ICD-10-CM

## 2017-05-10 DIAGNOSIS — Z923 Personal history of irradiation: Secondary | ICD-10-CM | POA: Insufficient documentation

## 2017-05-10 DIAGNOSIS — C787 Secondary malignant neoplasm of liver and intrahepatic bile duct: Secondary | ICD-10-CM | POA: Insufficient documentation

## 2017-05-10 DIAGNOSIS — K56600 Partial intestinal obstruction, unspecified as to cause: Secondary | ICD-10-CM

## 2017-05-10 LAB — COMPREHENSIVE METABOLIC PANEL
ALT: 28 U/L (ref 0–55)
ANION GAP: 11 (ref 3–11)
AST: 134 U/L — AB (ref 5–34)
Albumin: 2.1 g/dL — ABNORMAL LOW (ref 3.5–5.0)
Alkaline Phosphatase: 636 U/L — ABNORMAL HIGH (ref 40–150)
BUN: 4 mg/dL — AB (ref 7–26)
CHLORIDE: 97 mmol/L — AB (ref 98–109)
CO2: 29 mmol/L (ref 22–29)
Calcium: 10.1 mg/dL (ref 8.4–10.4)
Creatinine, Ser: 0.87 mg/dL (ref 0.60–1.10)
GFR calc non Af Amer: >60 mL/min (ref >60)
Glucose, Bld: 88 mg/dL (ref 70–140)
POTASSIUM: 3.8 mmol/L (ref 3.3–4.7)
Sodium: 137 mmol/L (ref 136–145)
Total Bilirubin: 1.5 mg/dL — ABNORMAL HIGH (ref 0.2–1.2)
Total Protein: 6 g/dL — ABNORMAL LOW (ref 6.4–8.3)

## 2017-05-10 LAB — CBC WITH DIFFERENTIAL/PLATELET
BASOS ABS: 0.1 10*3/uL (ref 0.0–0.1)
Basophils Relative: 1 %
Eosinophils Absolute: 0.1 10*3/uL (ref 0.0–0.5)
Eosinophils Relative: 1 %
HEMATOCRIT: 24.5 % — AB (ref 34.8–46.6)
HEMOGLOBIN: 8.1 g/dL — AB (ref 11.6–15.9)
LYMPHS ABS: 0.5 10*3/uL — AB (ref 0.9–3.3)
LYMPHS PCT: 6 %
MCH: 37.1 pg — ABNORMAL HIGH (ref 25.1–34.0)
MCHC: 33 g/dL (ref 31.5–36.0)
MCV: 112.2 fL — AB (ref 79.5–101.0)
Monocytes Absolute: 0.8 10*3/uL (ref 0.1–0.9)
Monocytes Relative: 10 %
NEUTROS ABS: 6.6 10*3/uL — AB (ref 1.5–6.5)
NEUTROS PCT: 82 %
Platelets: 114 10*3/uL — ABNORMAL LOW (ref 145–400)
RBC: 2.18 MIL/uL — AB (ref 3.70–5.45)
RDW: 18.4 % — ABNORMAL HIGH (ref 11.2–16.1)
WBC: 8 10*3/uL (ref 3.9–10.3)

## 2017-05-10 MED ORDER — HEPARIN SOD (PORK) LOCK FLUSH 100 UNIT/ML IV SOLN
500.0000 [IU] | Freq: Once | INTRAVENOUS | Status: AC
  Administered 2017-05-10: 500 [IU] via INTRAVENOUS
  Filled 2017-05-10: qty 5

## 2017-05-10 MED ORDER — SODIUM CHLORIDE 0.9% FLUSH
10.0000 mL | INTRAVENOUS | Status: DC | PRN
  Administered 2017-05-10: 10 mL via INTRAVENOUS
  Filled 2017-05-10: qty 10

## 2017-05-10 NOTE — Patient Instructions (Signed)
Implanted Port Home Guide An implanted port is a type of central line that is placed under the skin. Central lines are used to provide IV access when treatment or nutrition needs to be given through a person's veins. Implanted ports are used for long-term IV access. An implanted port may be placed because:  You need IV medicine that would be irritating to the small veins in your hands or arms.  You need long-term IV medicines, such as antibiotics.  You need IV nutrition for a long period.  You need frequent blood draws for lab tests.  You need dialysis.  Implanted ports are usually placed in the chest area, but they can also be placed in the upper arm, the abdomen, or the leg. An implanted port has two main parts:  Reservoir. The reservoir is round and will appear as a small, raised area under your skin. The reservoir is the part where a needle is inserted to give medicines or draw blood.  Catheter. The catheter is a thin, flexible tube that extends from the reservoir. The catheter is placed into a large vein. Medicine that is inserted into the reservoir goes into the catheter and then into the vein.  How will I care for my incision site? Do not get the incision site wet. Bathe or shower as directed by your health care provider. How is my port accessed? Special steps must be taken to access the port:  Before the port is accessed, a numbing cream can be placed on the skin. This helps numb the skin over the port site.  Your health care provider uses a sterile technique to access the port. ? Your health care provider must put on a mask and sterile gloves. ? The skin over your port is cleaned carefully with an antiseptic and allowed to dry. ? The port is gently pinched between sterile gloves, and a needle is inserted into the port.  Only "non-coring" port needles should be used to access the port. Once the port is accessed, a blood return should be checked. This helps ensure that the port  is in the vein and is not clogged.  If your port needs to remain accessed for a constant infusion, a clear (transparent) bandage will be placed over the needle site. The bandage and needle will need to be changed every week, or as directed by your health care provider.  Keep the bandage covering the needle clean and dry. Do not get it wet. Follow your health care provider's instructions on how to take a shower or bath while the port is accessed.  If your port does not need to stay accessed, no bandage is needed over the port.  What is flushing? Flushing helps keep the port from getting clogged. Follow your health care provider's instructions on how and when to flush the port. Ports are usually flushed with saline solution or a medicine called heparin. The need for flushing will depend on how the port is used.  If the port is used for intermittent medicines or blood draws, the port will need to be flushed: ? After medicines have been given. ? After blood has been drawn. ? As part of routine maintenance.  If a constant infusion is running, the port may not need to be flushed.  How long will my port stay implanted? The port can stay in for as long as your health care provider thinks it is needed. When it is time for the port to come out, surgery will be   done to remove it. The procedure is similar to the one performed when the port was put in. When should I seek immediate medical care? When you have an implanted port, you should seek immediate medical care if:  You notice a bad smell coming from the incision site.  You have swelling, redness, or drainage at the incision site.  You have more swelling or pain at the port site or the surrounding area.  You have a fever that is not controlled with medicine.  This information is not intended to replace advice given to you by your health care provider. Make sure you discuss any questions you have with your health care provider. Document  Released: 04/12/2005 Document Revised: 09/18/2015 Document Reviewed: 12/18/2012 Elsevier Interactive Patient Education  2017 Elsevier Inc.  

## 2017-05-10 NOTE — Progress Notes (Signed)
[place] [phone]   [phone]  OFFICE PROGRESS NOTE  [name], [place] [address]  DIAGNOSIS: Extensive stage (T2b, N2, M1a) small cell lung cancer presented with large left upper lobe lung mass and mediastinal lymphadenopathy as well as malignant left pleural effusion diagnosed in July 2017.  PRIOR THERAPY:  1) Systemic chemotherapy with cisplatin 60 MG/M2 on day 1 and etoposide at 120 MG/M2 on days 1, 2 and 3 with Neulasta support on day 4. She is status post 6 cycles. 2) prophylactic cranial irradiation under the care of Dr. Sondra Come completed on 05/18/2016. 3) palliative radiotherapy to the enlarging left upper lobe lung nodule under the care of Dr. Sondra Come. 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 5) Systemic chemotherapy with cisplatin 30 MG/M2 and irinotecan 65 MG/M2 on days 1 and 8 every 3 weeks. First dose 10/21/2016. Status post 4 cycles.  This was discontinued secondary to disease progression. 6) status post treatment with Temodar to 80 mg p.o. daily for 5 days every 4 weeks status post 2 cycles.  This was discontinued secondary to disease progression.  CURRENT THERAPY: Palliative and hospice care.  INTERVAL HISTORY: Sharon [name] 62 y.o. female returns to the clinic today for follow-up visit accompanied by her son-in-law.  Her daughter Sharon [name] during the visit by phone.  The patient is feeling fine today except for the generalized fatigue and wasting.  She lost a lot of weight in the last few months.  She was admitted 2 weeks ago to [facility] with abdominal pain and fever.  CT of the abdomen and pelvis performed at that time showed complex gas containing multilocular abscess in the anterior left lower abdomen with fistulous connection to the proximal sigmoid colon suggesting sequela of perforated diverticulitis but there was no evidence of bowel obstruction.  The  patient was noted to be a good candidate for surgical resection but she underwent CT-guided drainage by percutaneous catheter by interventional radiology.  She is currently a resident of a skilled nursing facility and continues to have drainage of the abscess via the catheter.  She denied having any current fever or chills.  She has no chest pain, shortness of breath, cough or hemoptysis.  CT scan of the chest performed at that time also showed significant disease progression in the chest as well as the liver.  The patient is here today for evaluation and recommendation regarding her condition.  MEDICAL HISTORY: Past Medical History:  Diagnosis Date  . Cancer of right breast ([place]) 2009  . CAP (community acquired pneumonia) 11/06/2015  . Chemotherapy induced neutropenia ([place]) 12/17/2016  . COPD (chronic obstructive pulmonary disease) ([place])   . Elevated blood pressure   . Encounter for antineoplastic chemotherapy 01/05/2016  . History of radiation therapy 05/04/16-05/18/16   whole brain 25 Gy in 10 fractions  . History of radiation therapy 09/01/16 - 09/21/16   Left Lung treated to 35 Gy in 14 fractions  . Hoarseness of voice    "for the last 2 months" (11/06/2015)  . Hypertension   . Hypokalemia 12/17/2016  . Lung mass dx'd 10/2015  . Menopause   . Personal history of breast cancer 03/29/2016  . Pre-diabetes   . Small cell lung cancer ([place])   . Vitamin D deficiency     ALLERGIES:  has No Known Allergies.  MEDICATIONS:  Current Outpatient Medications  Medication Sig Dispense Refill  . albuterol (  PROVENTIL HFA;VENTOLIN HFA) 108 (90 Base) MCG/ACT inhaler Inhale 1 puff into the lungs every 6 (six) hours as needed for wheezing or shortness of breath.    Marland Kitchen amoxicillin-clavulanate (AUGMENTIN) 875-125 MG tablet Take 1 tablet by mouth every 12 (twelve) hours for 12 days.  0  . budesonide-formoterol (SYMBICORT) 160-4.5 MCG/ACT inhaler Inhale 2 puffs into the lungs 2 (two) times daily. 1 Inhaler 11  .  ferrous sulfate 325 (65 FE) MG EC tablet Take 325 mg by mouth daily.    . fluticasone (FLONASE) 50 MCG/ACT nasal spray Place 1 spray into both nostrils daily.     . Glycerin, Adult, 2.1 g SUPP Place 1 suppository rectally as needed for moderate constipation.    . Ipratropium-Albuterol (COMBIVENT RESPIMAT) 20-100 MCG/ACT AERS respimat Inhale 1 puff into the lungs every 6 (six) hours.    . lactobacillus (FLORANEX/LACTINEX) PACK Take 1 packet (1 g total) by mouth 3 (three) times daily with meals.    . magnesium oxide (MAG-OX) 400 (241.3 Mg) MG tablet Take 1 tablet (400 mg total) by mouth 4 (four) times daily. 120 tablet 0  . meclizine (ANTIVERT) 12.5 MG tablet Take 1-2 tablets (12.5-25 mg total) by mouth 3 (three) times daily as needed for dizziness. 20 tablet 0  . mirtazapine (REMERON SOL-TAB) 15 MG disintegrating tablet Take 0.5 tablets (7.5 mg total) by mouth at bedtime.    . montelukast (SINGULAIR) 10 MG tablet Take 10 mg by mouth at bedtime.   2  . ondansetron (ZOFRAN) 8 MG tablet Take 1 tablet (8 mg total) every 8 (eight) hours as needed by mouth for nausea or vomiting. 20 tablet 1  . polyethylene glycol powder (GLYCOLAX/MIRALAX) powder Take 1 Container by mouth daily.    . potassium chloride 20 MEQ/15ML (10%) SOLN Take 15 mLs (20 mEq total) by mouth 2 (two) times daily. 900 mL 0  . prochlorperazine (COMPAZINE) 10 MG tablet TAKE 1 TABLET BY MOUTH EVERY 6 HOURS AS NEEDED FOR NAUSEA AND VOMITING 90 tablet 0  . SANTYL ointment APP TOPICALLY UTD  0  . senna (SENOKOT) 8.6 MG TABS tablet Take 1 tablet (8.6 mg total) by mouth daily. 30 tablet 0  . sucralfate (CARAFATE) 1 GM/10ML suspension Take 10 mLs (1 g total) by mouth 4 (four) times daily -  with meals and at bedtime. 420 mL 0  . temozolomide (TEMODAR) 140 MG capsule Take 2 capsules (280 mg total) daily by mouth. Take for days 1-5 every 28 days. May take on empty stomach & at bedtime to decrease N&V. 10 capsule 2  . UNABLE TO FIND Take 10 mLs by  mouth daily as needed. Med Name: CBD Oil     No current facility-administered medications for this visit.     SURGICAL HISTORY:  Past Surgical History:  Procedure Laterality Date  . BREAST BIOPSY Right 2009  . BREAST LUMPECTOMY Right 2009  . IR FLUORO GUIDE PORT INSERTION RIGHT  02/01/2017  . IR SINUS/FIST TUBE CHK-NON GI  05/02/2017  . IR US GUIDE VASC ACCESS RIGHT  02/01/2017  . UTERINE FIBROID SURGERY  2000  . VAGINAL HYSTERECTOMY  2000  . VESICOVAGINAL FISTULA CLOSURE W/ TAH  2000  . VIDEO BRONCHOSCOPY WITH ENDOBRONCHIAL ULTRASOUND N/A 11/10/2015   Procedure: VIDEO BRONCHOSCOPY WITH ENDOBRONCHIAL ULTRASOUND;  Surgeon: Javier Glazier, MD;  Location: [facility];  Service: Thoracic;  Laterality: N/A;    REVIEW OF SYSTEMS:  Constitutional: positive for anorexia, fatigue and weight loss Eyes: negative Ears, nose, mouth, throat,  and face: negative Respiratory: negative Cardiovascular: negative Gastrointestinal: positive for abdominal pain Genitourinary:negative Integument/breast: negative Hematologic/lymphatic: negative Musculoskeletal:positive for muscle weakness Neurological: negative Behavioral/Psych: negative Endocrine: negative Allergic/Immunologic: negative   PHYSICAL EXAMINATION: General appearance: alert, cooperative, fatigued and no distress Head: Normocephalic, without obvious abnormality, atraumatic Neck: no adenopathy, no JVD, supple, symmetrical, trachea midline and thyroid not enlarged, symmetric, no tenderness/mass/nodules Lymph nodes: Cervical, supraclavicular, and axillary nodes normal. Resp: clear to auscultation bilaterally Back: symmetric, no curvature. ROM normal. No CVA tenderness. Cardio: regular rate and rhythm, S1, S2 normal, no murmur, click, rub or gallop GI: Mild tenderness with drainage catheter in the left lower quadrant. Extremities: extremities normal, atraumatic, no cyanosis or edema Neurologic: Alert and oriented X 3, normal strength and tone.  Normal symmetric reflexes. Normal coordination and gait  ECOG PERFORMANCE STATUS: 2 - Symptomatic, <50% confined to bed  Blood pressure (!) 107/47, pulse 97, temperature 98.3 F (36.8 C), temperature source Oral, resp. rate 18, height 5\' 7"  (1.702 m), weight 189 lb (85.7 kg), SpO2 99 %.  LABORATORY DATA: Lab Results  Component Value Date   WBC 8.0 05/10/2017   HGB 8.1 (L) 05/10/2017   HCT 24.5 (L) 05/10/2017   MCV 112.2 (H) 05/10/2017   PLT 114 (L) 05/10/2017      Chemistry      Component Value Date/Time   NA 137 05/10/2017 1205   NA 133 (L) 04/13/2017 1058   K 3.8 05/10/2017 1205   K 3.6 04/13/2017 1058   CL 97 (L) 05/10/2017 1205   CL 104 07/18/2012 1451   CO2 29 05/10/2017 1205   CO2 24 04/13/2017 1058   BUN 4 (L) 05/10/2017 1205   BUN 9.4 04/13/2017 1058   CREATININE 0.87 05/10/2017 1205   CREATININE 1.0 04/13/2017 1058      Component Value Date/Time   CALCIUM 10.1 05/10/2017 1205   CALCIUM 10.0 04/13/2017 1058   ALKPHOS 636 (H) 05/10/2017 1205   ALKPHOS 483 (H) 04/13/2017 1058   AST 134 (H) 05/10/2017 1205   AST 127 (H) 04/13/2017 1058   ALT 28 05/10/2017 1205   ALT 49 04/13/2017 1058   BILITOT 1.5 (H) 05/10/2017 1205   BILITOT 0.97 04/13/2017 1058       RADIOGRAPHIC STUDIES: Dg Chest 2 View  Result Date: 04/24/2017 CLINICAL DATA:  Shortness of breath. All on immunotherapy for lung cancer. Ex-smoker. EXAM: CHEST  2 VIEW COMPARISON:  CT 03/09/2017.  Plain film of 01/09/2017. FINDINGS: Lateral view degraded by patient arm position. Right Port-A-Cath terminates at the low SVC. Midline trachea. Normal heart size. New small left pleural effusion. Probable treatment effects in the left upper lobe laterally. Clear right lung. Left base subsegmental atelectasis. IMPRESSION: New small left pleural effusion with adjacent left base subsegmental atelectasis. Electronically Signed   By: Abigail Miyamoto M.D.   On: 04/24/2017 16:21   Ct Angio Chest Pe W And/or Wo  Contrast  Result Date: 04/24/2017 CLINICAL DATA:  Extensive stage small cell left lung carcinoma with brain metastases diagnosed July 2017. History of palliative radiotherapy of an enlarging left upper lobe pulmonary nodule. Ongoing medical therapy. Patient presents with generalized abdominal pain and clinical concern for pulmonary embolism. EXAM: CT ANGIOGRAPHY CHEST CT ABDOMEN AND PELVIS WITH CONTRAST TECHNIQUE: Multidetector CT imaging of the chest was performed using the standard protocol during bolus administration of intravenous contrast. Multiplanar CT image reconstructions and MIPs were obtained to evaluate the vascular anatomy. Multidetector CT imaging of the abdomen and pelvis was performed using the  standard protocol during bolus administration of intravenous contrast. CONTRAST:  < 100 cc > ISOVUE-370 IOPAMIDOL (ISOVUE-370) INJECTION 76% COMPARISON:  03/09/2017 CT chest and abdomen. Chest radiograph from earlier today. 10/15/2016 CT abdomen/pelvis. FINDINGS: CTA CHEST FINDINGS Cardiovascular: The study is moderate quality for the evaluation of pulmonary embolism, with evaluation of the subsegmental vessels limited by motion. There are no filling defects in the central, lobar, segmental or subsegmental pulmonary artery branches to suggest acute pulmonary embolism. Mildly atherosclerotic nonaneurysmal thoracic aorta. Normal caliber pulmonary arteries. Normal heart size. No significant pericardial fluid/thickening. Right internal jugular MediPort terminates in the right atrium. Mediastinum/Nodes: No discrete thyroid nodules. Unremarkable esophagus. No axillary adenopathy. Newly enlarged 2.0 cm right supraclavicular node (series 7/image 8). Increased right paratracheal adenopathy measuring up to 2.1 cm (series 7/ image 24), previously 1.2 cm. Increased prevascular bilateral mediastinal adenopathy measuring up to 2.0 cm on the right (series 7/ image 26), previously 1.3 cm. Increased 1.9 cm enlarged  subcarinal node (series 7/ image 42), previously 1.1 cm. Newly enlarged 1.8 cm AP window node (series 7/ image 35). Newly moderate infiltrative left hilar adenopathy measuring up to 2.0 cm (series 7/ image 48), with associated extrinsic narrowing of segmental lingular and left lower lobe airways. No right hilar adenopathy. Lungs/Pleura: No pneumothorax. No right pleural effusion. New small dependent left pleural effusion. Mild centrilobular emphysema with mild diffuse bronchial wall thickening. Stable bandlike consolidation in the anterior left upper lobe compatible with radiation fibrosis. Stable irregular solid 1.5 cm left upper lobe pulmonary nodule (series 13/image 40). New patchy consolidation, parenchymal banding and nodularity in the posterior left upper lobe and anterior left lower lobe. A few new scattered solid pulmonary nodules in the right lung, largest 6 mm in the anterior right middle lobe (series 13/ image 76). Musculoskeletal: No aggressive appearing focal osseous lesions. Moderate thoracic spondylosis. Review of the MIP images confirms the above findings. CT ABDOMEN and PELVIS FINDINGS Hepatobiliary: New hepatomegaly. Innumerable hypoenhancing masses replacing much of the liver, significantly increased in size and number, for example a 2.0 cm superior liver mass (series 4/ image 15), increased from 0.7 cm. Normal gallbladder with no radiopaque cholelithiasis. No biliary ductal dilatation. Pancreas: Normal, with no mass or duct dilation. Spleen: Normal size. No mass. Adrenals/Urinary Tract: Newly apparent 1.4 cm left adrenal nodule with density 64 HU. No right adrenal nodule. No hydronephrosis. Subcentimeter hypodense right renal cortical lesion is too small to characterize and is stable. No new renal lesions. Normal bladder. Stomach/Bowel: Normal non-distended stomach. Normal appendix. Mild sigmoid diverticulosis. There is a gas containing 6.0 x 4.9 x 7.2 cm multilocular abscess in the ventral left  lower abdomen (series 4/image 66) with thick enhancing wall, which appears to demonstrate a fistulous connection to the proximal sigmoid colon (series 15/image 70), and which is intimately associated with multiple small bowel loops. No small bowel dilatation. No focal small bowel caliber transition. No definite small bowel wall thickening. Vascular/Lymphatic: Atherosclerotic nonaneurysmal abdominal aorta. Patent portal, splenic, hepatic and renal veins. No pathologically enlarged lymph nodes in the abdomen or pelvis. Reproductive: Status post hysterectomy, with no abnormal findings at the vaginal cuff. No adnexal mass. Other: No ascites. Musculoskeletal: Previously visualized faintly sclerotic lesions throughout the lumbar vertebral bodies are not discretely visualized on today's scan. Mild lumbar spondylosis. Review of the MIP images confirms the above findings. IMPRESSION: 1. No evidence of pulmonary embolism. 2. Complex gas-containing multilocular abscess in the anterior left lower abdomen with fistulous connection to the proximal sigmoid colon (suggesting the  sequela of perforated diverticulitis), intimately associated with multiple small bowel loops. No evidence of bowel obstruction. 3. Significant progression of metastatic disease. Right supraclavicular, bilateral mediastinal and left hilar adenopathy is significantly increased. 4. New extrinsic narrowing of segmental left upper and left lower lobe airways by the infiltrative left hilar adenopathy. New patchy consolidation and nodularity in the posterior left upper lobe and anterior left lower lobe, favor a combination of postobstructive pneumonia and tumor. New scattered probable small pulmonary metastases in the right lung. 5. Liver metastases are significantly increased in size and number with new hepatomegaly. 6. New left adrenal metastasis. 7. New small dependent left pleural effusion. 8. Aortic Atherosclerosis (ICD10-I70.0) and Emphysema (ICD10-J43.9).  Electronically Signed   By: Ilona Sorrel M.D.   On: 04/24/2017 18:01   Ct Abdomen Pelvis W Contrast  Result Date: 04/30/2017 CLINICAL DATA:  62 year old female with a history of extensive stage small cell lung carcinoma including brain metastases and a recent diverticular abscess treated with percutaneous drain placement on 04/25/2017. New onset right lower quadrant pain. EXAM: CT ABDOMEN AND PELVIS WITH CONTRAST TECHNIQUE: Multidetector CT imaging of the abdomen and pelvis was performed using the standard protocol following bolus administration of intravenous contrast. CONTRAST:  131mL ISOVUE-300 IOPAMIDOL (ISOVUE-300) INJECTION 61% COMPARISON:  Prior CT scan of the abdomen and pelvis 04/24/2017; CT drain placement 04/25/2017 FINDINGS: Lower chest: Enlarged and now moderate simple layering left pleural effusion with associated left lower lobe atelectasis. New small right-sided pleural effusion also with associated subsegmental atelectasis. The heart remains normal in size. Incompletely imaged port catheter tip in the upper right atrium. No pericardial effusion. Unremarkable distal thoracic esophagus. Hepatobiliary: Innumerable hypoechoic lesions scattered throughout the liver consistent with extensive metastatic disease. No significant interval change compared to recent prior imaging. Gallbladder is unremarkable. No intra or extrahepatic biliary ductal dilatation. Pancreas: Unremarkable. No pancreatic ductal dilatation or surrounding inflammatory changes. Spleen: Normal. Adrenals/Urinary Tract: Stable left adrenal nodule. Unremarkable right adrenal gland. The kidneys are normal in appearance. No hydronephrosis or nephrolithiasis. Unremarkable ureters and bladder. Stomach/Bowel: Moderate size rectal stool ball measuring 5 x 4.6 cm. There is a mild thickening of the adjacent rectal wall. Scattered sigmoid diverticula. Decreased inflammation. Small soft tissue tracks continues to extend from the region of the  sigmoid colon to the prior abscess cavity. The abscess cavity is completely decompressed. The drainage catheter remains in good position. Normal appendix in a retrocecal position. No evidence of bowel obstruction. Vascular/Lymphatic: Atherosclerotic plaque in the abdominal aorta. No aneurysm or dissection. No significant lymphadenopathy. Reproductive: Status post hysterectomy. No adnexal masses. Other: Small volume perihepatic an intra mesenteric ascites. No loculation or gas to suggest active infection. No abdominal wall hernia. Musculoskeletal: No acute or significant osseous findings. IMPRESSION: 1. Well-positioned drainage catheter with complete resolution of the previously noted diverticular abscess. 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 Recommended drain injection prior to removal. 2. No new abscess or acute intraabdominal abnormality. 3. Moderate volume of formed stool including a 5 cm rectal stool ball suggests constipation. 4. Slightly increased but still minimal abdominal ascites which is likely malignant or related to the diffuse hepatic metastatic disease. 5. New small right sided pleural effusion and enlarged moderate left-sided pleural effusion. Electronically Signed   By: Jacqulynn Cadet M.D.   On: 04/30/2017 08:46   Ct Abdomen Pelvis W Contrast  Result Date: 04/24/2017 CLINICAL DATA:  Extensive  stage small cell left lung carcinoma with brain metastases diagnosed July 2017. History of palliative radiotherapy of an enlarging left upper lobe pulmonary nodule. Ongoing medical therapy. Patient presents with generalized abdominal pain and clinical concern for pulmonary embolism. EXAM: CT ANGIOGRAPHY CHEST CT ABDOMEN AND PELVIS WITH CONTRAST TECHNIQUE: Multidetector CT imaging of the chest was performed using the standard protocol during bolus administration of intravenous  contrast. Multiplanar CT image reconstructions and MIPs were obtained to evaluate the vascular anatomy. Multidetector CT imaging of the abdomen and pelvis was performed using the standard protocol during bolus administration of intravenous contrast. CONTRAST:  < 100 cc > ISOVUE-370 IOPAMIDOL (ISOVUE-370) INJECTION 76% COMPARISON:  03/09/2017 CT chest and abdomen. Chest radiograph from earlier today. 10/15/2016 CT abdomen/pelvis. FINDINGS: CTA CHEST FINDINGS Cardiovascular: The study is moderate quality for the evaluation of pulmonary embolism, with evaluation of the subsegmental vessels limited by motion. There are no filling defects in the central, lobar, segmental or subsegmental pulmonary artery branches to suggest acute pulmonary embolism. Mildly atherosclerotic nonaneurysmal thoracic aorta. Normal caliber pulmonary arteries. Normal heart size. No significant pericardial fluid/thickening. Right internal jugular MediPort terminates in the right atrium. Mediastinum/Nodes: No discrete thyroid nodules. Unremarkable esophagus. No axillary adenopathy. Newly enlarged 2.0 cm right supraclavicular node (series 7/image 8). Increased right paratracheal adenopathy measuring up to 2.1 cm (series 7/ image 24), previously 1.2 cm. Increased prevascular bilateral mediastinal adenopathy measuring up to 2.0 cm on the right (series 7/ image 26), previously 1.3 cm. Increased 1.9 cm enlarged subcarinal node (series 7/ image 42), previously 1.1 cm. Newly enlarged 1.8 cm AP window node (series 7/ image 35). Newly moderate infiltrative left hilar adenopathy measuring up to 2.0 cm (series 7/ image 48), with associated extrinsic narrowing of segmental lingular and left lower lobe airways. No right hilar adenopathy. Lungs/Pleura: No pneumothorax. No right pleural effusion. New small dependent left pleural effusion. Mild centrilobular emphysema with mild diffuse bronchial wall thickening. Stable bandlike consolidation in the anterior left  upper lobe compatible with radiation fibrosis. Stable irregular solid 1.5 cm left upper lobe pulmonary nodule (series 13/image 40). New patchy consolidation, parenchymal banding and nodularity in the posterior left upper lobe and anterior left lower lobe. A few new scattered solid pulmonary nodules in the right lung, largest 6 mm in the anterior right middle lobe (series 13/ image 76). Musculoskeletal: No aggressive appearing focal osseous lesions. Moderate thoracic spondylosis. Review of the MIP images confirms the above findings. CT ABDOMEN and PELVIS FINDINGS Hepatobiliary: New hepatomegaly. Innumerable hypoenhancing masses replacing much of the liver, significantly increased in size and number, for example a 2.0 cm superior liver mass (series 4/ image 15), increased from 0.7 cm. Normal gallbladder with no radiopaque cholelithiasis. No biliary ductal dilatation. Pancreas: Normal, with no mass or duct dilation. Spleen: Normal size. No mass. Adrenals/Urinary Tract: Newly apparent 1.4 cm left adrenal nodule with density 64 HU. No right adrenal nodule. No hydronephrosis. Subcentimeter hypodense right renal cortical lesion is too small to characterize and is stable. No new renal lesions. Normal bladder. Stomach/Bowel: Normal non-distended stomach. Normal appendix. Mild sigmoid diverticulosis. There is a gas containing 6.0 x 4.9 x 7.2 cm multilocular abscess in the ventral left lower abdomen (series 4/image 66) with thick enhancing wall, which appears to demonstrate a fistulous connection to the proximal sigmoid colon (series 15/image 70), and which is intimately associated with multiple small bowel loops. No small bowel dilatation. No focal small bowel caliber transition. No definite small bowel wall thickening. Vascular/Lymphatic: Atherosclerotic nonaneurysmal abdominal aorta.  Patent portal, splenic, hepatic and renal veins. No pathologically enlarged lymph nodes in the abdomen or pelvis. Reproductive: Status post  hysterectomy, with no abnormal findings at the vaginal cuff. No adnexal mass. Other: No ascites. Musculoskeletal: Previously visualized faintly sclerotic lesions throughout the lumbar vertebral bodies are not discretely visualized on today's scan. Mild lumbar spondylosis. Review of the MIP images confirms the above findings. IMPRESSION: 1. No evidence of pulmonary embolism. 2. Complex gas-containing multilocular abscess in the anterior left lower abdomen with fistulous connection to the proximal sigmoid colon (suggesting the sequela of perforated diverticulitis), intimately associated with multiple small bowel loops. No evidence of bowel obstruction. 3. Significant progression of metastatic disease. Right supraclavicular, bilateral mediastinal and left hilar adenopathy is significantly increased. 4. New extrinsic narrowing of segmental left upper and left lower lobe airways by the infiltrative left hilar adenopathy. New patchy consolidation and nodularity in the posterior left upper lobe and anterior left lower lobe, favor a combination of postobstructive pneumonia and tumor. New scattered probable small pulmonary metastases in the right lung. 5. Liver metastases are significantly increased in size and number with new hepatomegaly. 6. New left adrenal metastasis. 7. New small dependent left pleural effusion. 8. Aortic Atherosclerosis (ICD10-I70.0) and Emphysema (ICD10-J43.9). Electronically Signed   By: Ilona Sorrel M.D.   On: 04/24/2017 18:01   Ir Sinus/fist Tube Chk-non Gi  Result Date: 05/02/2017 CLINICAL DATA:  History of metastatic small cell lung cancer with findings worrisome for advanced metastatic disease. Unfortunately, patient developed concomitant presumed diverticular abscess within the ventral aspect of the lower abdomen for which the patient underwent successful CT-guided percutaneous drainage catheter placement on 04/25/2017. Subsequent abdominal CT performed 04/29/2017 demonstrates complete  resolution of the diverticular abscess. There has been minimal output from the percutaneous drainage catheter for the past several days and as such, the patient presents now for fluoroscopic guided drainage catheter injection prior to potential removal. EXAM: SINUS TRACT INJECTION/FISTULOGRAM COMPARISON:  CT abdomen pelvis- 04/29/2017; 04/24/2017; CT-guided percutaneous drainage catheter placement - 04/25/2017 CONTRAST:  10 cc Isovue 300, administered via the existing percutaneous drainage catheter. FLUOROSCOPY TIME:  1 minute, 12 seconds (68 mGy) TECHNIQUE: The patient was positioned supine on the fluoroscopy table. A preprocedural spot fluoroscopic image was obtained of the lower pelvis and existing percutaneous drainage catheter Multiple spot fluoroscopic and radiographic images were obtained following the injection of a small amount of contrast via the existing percutaneous drainage catheter. Images reviewed in the procedure was terminated. The drainage catheter was flushed with a small amount of saline and reconnected to a gravity bag. As was placed. The patient tolerated the procedure well without immediate postprocedural complication. FINDINGS: Preprocedural spot fluoroscopic image demonstrates unchanged positioning of left lower abdominal percutaneous drainage catheter Contrast injection demonstrates opacification of decompressed abscess cavity, however there is persistent fistulous connection between the decompressed abscess cavity in the adjacent sigmoid colon. IMPRESSION: Fistulous connection between the decompressed abscess cavity and the adjacent sigmoid colon. As such, the drainage catheter was NOT removed at this time. PLAN: - Patient was instructed to no longer flush the percutaneous drainage catheter. - Patient was instructed to maintain diligent records regarding drainage catheter output. - Patient should undergo drainage catheter injection in approximately 2 weeks. This could be done in the  hospital (if the patient is still an inpatient), otherwise the injection should be performed at the interventional radiology drain Clinic [phone]). Electronically Signed   By: Sandi Mariscal M.D.   On: 05/02/2017 11:35   Ct Image Guided Drainage  By Percutaneous Catheter  Result Date: 04/25/2017 INDICATION: History of metastatic small cell lung cancer with findings worrisome for advanced metastatic disease. Unfortunately, patient has developed a concomitant presumed diverticular abscess within the ventral aspect of the left lower abdomen. Given poor operative candidacy, request made for CT-guided percutaneous drainage catheter placement for infection source control purposes. EXAM: CT IMAGE GUIDED DRAINAGE BY PERCUTANEOUS CATHETER COMPARISON:  CT abdomen and pelvis - 04/24/2017 MEDICATIONS: The patient is currently admitted to the hospital and receiving intravenous antibiotics. The antibiotics were administered within an appropriate time frame prior to the initiation of the procedure. ANESTHESIA/SEDATION: Moderate (conscious) sedation was employed during this procedure. A total of Versed 2 mg and Fentanyl 100 mcg was administered intravenously. Moderate Sedation Time: 13 minutes. The patient's level of consciousness and vital signs were monitored continuously by radiology nursing throughout the procedure under my direct supervision. CONTRAST:  None COMPLICATIONS: None immediate. PROCEDURE: Informed written consent was obtained from the patient after a discussion of the risks, benefits and alternatives to treatment. The patient was placed supine on the CT gantry and a pre procedural CT was performed re-demonstrating the known abscess/fluid collection within the ventral aspect of the left lower abdomen/ pelvis with dominant air in fluid containing component measuring approximately 6.8 x 4.7 cm (image 18, series 2). The procedure was planned. A timeout was performed prior to the initiation of the procedure. The skin  overlying the ventral aspect of the left lower abdomen was prepped and draped in the usual sterile fashion. The overlying soft tissues were anesthetized with 1% lidocaine with epinephrine. Appropriate trajectory was planned with the use of a 22 gauge spinal needle. An 18 gauge trocar needle was advanced into the abscess/fluid collection and a short Amplatz super stiff wire was coiled within the collection. Appropriate positioning was confirmed with a limited CT scan. The tract was serially dilated allowing placement of a 10 [place] all-purpose drainage catheter. Appropriate positioning was confirmed with a limited postprocedural CT scan. Approximately 80 Ml of purulent fluid was aspirated. The tube was connected to a drainage bag and sutured in place. A dressing was placed. The patient tolerated the procedure well without immediate post procedural complication. IMPRESSION: Successful CT guided placement of a 10 [place] all purpose drain catheter into the presumed diverticular abscess within the ventral aspect the left lower abdomen with aspiration of 80 mL of purulent fluid. Samples were sent to the laboratory as requested by the ordering clinical team. Electronically Signed   By: Sandi Mariscal M.D.   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 This was followed by disease progression with new bulky left supraclavicular lymphadenopathy as well as new enlarged mediastinal left hilar lymph nodes. The patient was started on systemic chemotherapy with cisplatin 30 MG/M2 and irinotecan 65 MG/M2 on days 1 and 8 every 3 weeks status post 4 cycles. This treatment was  discontinued secondary to intolerance as well as disease progression. The patient was a started on treatment with Temodar 280 mg p.o. daily for 5 days every 4 weeks status post 2 cycles. Unfortunately the recent CT scan of the chest, abdomen and pelvis during her hospitalization showed evidence for disease progression in  the chest as well as extensive liver metastasis.  The patient also has diverticular abscess and currently undergoing percutaneous drainage. I had a lengthy discussion with the patient and her family about her current condition and treatment options.  The patient lost a lot of weight recently and her performance status has declined. I strongly recommend for the patient to consider palliative care and hospice at this point.  Other option for treatment of her condition will be immunotherapy with Nivolumab but this is unlikely to add any benefit for the patient in this current condition. After a lengthy discussion about her prognosis and life expectancy which definitely short and could be only a few weeks, the patient and her family decided to proceed with palliative care and hospice at this point.  We will called the hospice service of [facility] to see the patient. She will continue drainage for the diverticular abscess as recommended by interventional radiology. I would see the patient on as-needed basis at this point but she was advised to call if she has any concerning symptoms. The patient and her family were very appreciative for the care she received during this course of her treatment. The patient voices understanding of current disease status and treatment options and is in agreement with the current care plan. All questions were answered. The patient knows to call the clinic with any problems, questions or concerns. We can certainly see the patient much sooner if necessary.  Disclaimer: This note was dictated with voice recognition software. Similar sounding words can  inadvertently be transcribed and may not be corrected upon review.

## 2017-05-10 NOTE — Progress Notes (Signed)
Hospice referral made to [facility] and notified that pt at [facility].

## 2017-05-17 ENCOUNTER — Encounter: Payer: Self-pay | Admitting: Radiology

## 2017-05-17 ENCOUNTER — Ambulatory Visit
Admission: RE | Admit: 2017-05-17 | Discharge: 2017-05-17 | Disposition: A | Discharge status: Home / Self Care | Payer: 59 | Source: Ambulatory Visit | Attending: General Surgery | Admitting: General Surgery

## 2017-05-17 ENCOUNTER — Other Ambulatory Visit: Payer: Self-pay | Admitting: General Surgery

## 2017-05-17 ENCOUNTER — Ambulatory Visit
Admission: RE | Admit: 2017-05-17 | Discharge: 2017-05-17 | Disposition: A | Discharge status: Home / Self Care | Payer: 59 | Source: Ambulatory Visit | Attending: Radiology | Admitting: Radiology

## 2017-05-17 DIAGNOSIS — K5720 Diverticulitis of large intestine with perforation and abscess without bleeding: Secondary | ICD-10-CM

## 2017-05-17 HISTORY — PX: IR RADIOLOGIST EVAL & MGMT: IMG5224

## 2017-05-17 NOTE — Progress Notes (Signed)
Chief Complaint: Patient was seen in consultation today for follow up LLQ drain at the request of Allred,Darrell K  Referring Physician(s): Allred,Darrell K  History of Present Illness: Sharon [name] is a 62 y.o. female with multiple medical issues including extensive stage lung cancer for which she is no longer pursuing treatment and has been referred for palliative/hospice care. She also developed a diverticular abscess and had a LLQ drain placed on 12/31. 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 She has been hooked to gravity bag and is now here for follow up. She is a poor surgical candidate. Pt and family report continue thin liquid feculent output. Poor appetite as well. No fevers, abd pain.  Past Medical History:  Diagnosis Date  . Cancer of right breast ([place]) 2009  . CAP (community acquired pneumonia) 11/06/2015  . Chemotherapy induced neutropenia ([place]) 12/17/2016  . COPD (chronic obstructive pulmonary disease) ([place])   . Elevated blood pressure   . Encounter for antineoplastic chemotherapy 01/05/2016  . History of radiation therapy 05/04/16-05/18/16   whole brain 25 Gy in 10 fractions  . History of radiation therapy 09/01/16 - 09/21/16   Left Lung treated to 35 Gy in 14 fractions  . Hoarseness of voice    "for the last 2 months" (11/06/2015)  . Hypertension   . Hypokalemia 12/17/2016  . Lung mass dx'd 10/2015  . Menopause   . Personal history of breast cancer 03/29/2016  . Pre-diabetes   . Small cell lung cancer ([place])   . Vitamin D deficiency     Past Surgical History:  Procedure Laterality Date  . BREAST BIOPSY Right 2009  . BREAST LUMPECTOMY Right 2009  . IR FLUORO GUIDE PORT INSERTION RIGHT  02/01/2017  . IR RADIOLOGIST EVAL & MGMT  05/17/2017  . IR SINUS/FIST TUBE CHK-NON GI  05/02/2017  . IR US GUIDE VASC ACCESS RIGHT  02/01/2017  . UTERINE FIBROID SURGERY  2000  . VAGINAL HYSTERECTOMY  2000  . VESICOVAGINAL FISTULA CLOSURE W/ TAH  2000  .  VIDEO BRONCHOSCOPY WITH ENDOBRONCHIAL ULTRASOUND N/A 11/10/2015   Procedure: VIDEO BRONCHOSCOPY WITH ENDOBRONCHIAL ULTRASOUND;  Surgeon: Javier Glazier, MD;  Location: [facility];  Service: Thoracic;  Laterality: N/A;    Allergies: Patient has no known allergies.  Medications: Prior to Admission medications   Medication Sig Start Date End Date Taking? Authorizing Provider  albuterol (PROVENTIL HFA;VENTOLIN HFA) 108 (90 Base) MCG/ACT inhaler Inhale 1 puff into the lungs every 6 (six) hours as needed for wheezing or shortness of breath.    [provider]  budesonide-formoterol (SYMBICORT) 160-4.5 MCG/ACT inhaler Inhale 2 puffs into the lungs 2 (two) times daily. 08/17/16   Javier Glazier, MD  ferrous sulfate 325 (65 FE) MG EC tablet Take 325 mg by mouth daily.    [provider]  fluticasone (FLONASE) 50 MCG/ACT nasal spray Place 1 spray into both nostrils daily.  08/20/16   [provider]  Glycerin, Adult, 2.1 g SUPP Place 1 suppository rectally as needed for moderate constipation. Patient not taking: Reported on 05/10/2017 05/04/17   Barton Dubois, MD  Ipratropium-Albuterol (COMBIVENT RESPIMAT) 20-100 MCG/ACT AERS respimat Inhale 1 puff into the lungs every 6 (six) hours.    [provider]  lactobacillus (FLORANEX/LACTINEX) PACK Take 1 packet (1 g total) by mouth 3 (three) times daily with meals. 05/04/17   Barton Dubois, MD  magnesium oxide (MAG-OX) 400 (241.3 Mg) MG tablet Take 1 tablet (400 mg total) by  mouth 4 (four) times daily. 01/26/17   Maryanna Shape, NP  meclizine (ANTIVERT) 12.5 MG tablet Take 1-2 tablets (12.5-25 mg total) by mouth 3 (three) times daily as needed for dizziness. Patient not taking: Reported on 05/10/2017 06/05/16   Virgel Manifold, MD  mirtazapine (REMERON SOL-TAB) 15 MG disintegrating tablet Take 0.5 tablets (7.5 mg total) by mouth at bedtime. 05/04/17   Barton Dubois, MD  montelukast (SINGULAIR) 10 MG tablet Take 10 mg by mouth at  bedtime.  09/06/16   [provider]  ondansetron (ZOFRAN) 8 MG tablet Take 1 tablet (8 mg total) every 8 (eight) hours as needed by mouth for nausea or vomiting. Patient not taking: Reported on 05/10/2017 03/11/17   Maryanna Shape, NP  polyethylene glycol powder (GLYCOLAX/MIRALAX) powder Take 1 Container by mouth daily.    [provider]  potassium chloride 20 MEQ/15ML (10%) SOLN Take 15 mLs (20 mEq total) by mouth 2 (two) times daily. 02/25/17   Maryanna Shape, NP  prochlorperazine (COMPAZINE) 10 MG tablet TAKE 1 TABLET BY MOUTH EVERY 6 HOURS AS NEEDED FOR NAUSEA AND VOMITING 02/11/17   Maryanna Shape, NP  SANTYL ointment APP TOPICALLY UTD 12/08/16   [provider]  senna (SENOKOT) 8.6 MG TABS tablet Take 1 tablet (8.6 mg total) by mouth daily. 10/17/16   Patrecia Pour, MD  sucralfate (CARAFATE) 1 GM/10ML suspension Take 10 mLs (1 g total) by mouth 4 (four) times daily -  with meals and at bedtime. 01/19/17   Gery Pray, MD  temozolomide (TEMODAR) 140 MG capsule Take 2 capsules (280 mg total) daily by mouth. Take for days 1-5 every 28 days. May take on empty stomach & at bedtime to decrease N&V. 03/15/17   Curt Bears, MD  UNABLE TO FIND Take 10 mLs by mouth daily as needed. Med Name: CBD Oil    [provider]     Family History  Adopted: Yes  Problem Relation Age of Onset  . Cancer Neg Hx     Social History   Socioeconomic History  . Marital status: Divorced    Spouse name: Not on file  . Number of children: 3  . Years of education: Not on file  . Highest education level: Not on file  Social Needs  . Financial resource strain: Not on file  . Food insecurity - worry: Not on file  . Food insecurity - inability: Not on file  . Transportation needs - medical: Not on file  . Transportation needs - non-medical: Not on file  Occupational History  . Occupation: Lawncare   Tobacco Use  . Smoking status: Former Smoker    Packs/day: 0.10      Years: 46.00    Pack years: 4.60    Types: Cigarettes    Last attempt to quit: 11/11/2015    Years since quitting: 1.5  . Smokeless tobacco: Never Used  . Tobacco comment: Peak rate of 2ppd  Substance and Sexual Activity  . Alcohol use: No  . Drug use: Yes    Types: Marijuana    Comment: 11/06/2015 "1-2 times/week when I do smoke; none lately"  . Sexual activity: Yes  Other Topics Concern  . Not on file  Social History Narrative  . Not on file     Review of Systems: A 12 point ROS discussed and pertinent positives are indicated in the HPI above.  All other systems are negative.  Review of Systems  Vital Signs: BP (!) 104/49  Pulse (!) 106   Temp 98.1 F (36.7 C)   SpO2 98%   Physical Exam LLQ drain intact, site clean, dry. Feculent output in bag, about 100 mL ABd otherwise soft, NT  Drain injection suggests continued presence of fistula as well as possible vascular communication.  Imaging: Dg Chest 2 View  Result Date: 04/24/2017 CLINICAL DATA:  Shortness of breath. All on immunotherapy for lung cancer. Ex-smoker. EXAM: CHEST  2 VIEW COMPARISON:  CT 03/09/2017.  Plain film of 01/09/2017. FINDINGS: Lateral view degraded by patient arm position. Right Port-A-Cath terminates at the low SVC. Midline trachea. Normal heart size. New small left pleural effusion. Probable treatment effects in the left upper lobe laterally. Clear right lung. Left base subsegmental atelectasis. IMPRESSION: New small left pleural effusion with adjacent left base subsegmental atelectasis. Electronically Signed   By: Abigail Miyamoto M.D.   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 History of palliative radiotherapy of an enlarging left upper lobe pulmonary nodule. Ongoing medical therapy. Patient presents with generalized abdominal pain and clinical  concern for pulmonary embolism. EXAM: CT ANGIOGRAPHY CHEST CT ABDOMEN AND PELVIS WITH CONTRAST TECHNIQUE: Multidetector CT imaging of the chest was performed using the standard protocol during bolus administration of intravenous contrast. Multiplanar CT image reconstructions and MIPs were obtained to evaluate the vascular anatomy. Multidetector CT imaging of the abdomen and pelvis was performed using the standard protocol during bolus administration of intravenous contrast. CONTRAST:  < 100 cc > ISOVUE-370 IOPAMIDOL (ISOVUE-370) INJECTION 76% COMPARISON:  03/09/2017 CT chest and abdomen. Chest radiograph from earlier today. 10/15/2016 CT abdomen/pelvis. FINDINGS: CTA CHEST FINDINGS Cardiovascular: The study is moderate quality for the evaluation of pulmonary embolism, with evaluation of the subsegmental vessels limited by motion. There are no filling defects in the central, lobar, segmental or subsegmental pulmonary artery branches to suggest acute pulmonary embolism. Mildly atherosclerotic nonaneurysmal thoracic aorta. Normal caliber pulmonary arteries. Normal heart size. No significant pericardial fluid/thickening. Right internal jugular MediPort terminates in the right atrium. Mediastinum/Nodes: No discrete thyroid nodules. Unremarkable esophagus. No axillary adenopathy. Newly enlarged 2.0 cm right supraclavicular node (series 7/image 8). Increased right paratracheal adenopathy measuring up to 2.1 cm (series 7/ image 24), previously 1.2 cm. Increased prevascular bilateral mediastinal adenopathy measuring up to 2.0 cm on the right (series 7/ image 26), previously 1.3 cm. Increased 1.9 cm enlarged subcarinal node (series 7/ image 42), previously 1.1 cm. Newly enlarged 1.8 cm AP window node (series 7/ image 35). Newly moderate infiltrative left hilar adenopathy measuring up to 2.0 cm (series 7/ image 48), with associated extrinsic narrowing of segmental lingular and left lower lobe airways. No right hilar  adenopathy. Lungs/Pleura: No pneumothorax. No right pleural effusion. New small dependent left pleural effusion. Mild centrilobular emphysema with mild diffuse bronchial wall thickening. Stable bandlike consolidation in the anterior left upper lobe compatible with radiation fibrosis. Stable irregular solid 1.5 cm left upper lobe pulmonary nodule (series 13/image 40). New patchy consolidation, parenchymal banding and nodularity in the posterior left upper lobe and anterior left lower lobe. A few new scattered solid pulmonary nodules in the right lung, largest 6 mm in the anterior right middle lobe (series 13/ image 76). Musculoskeletal: No aggressive appearing focal osseous lesions. Moderate thoracic spondylosis. Review of the MIP images confirms the above findings. CT ABDOMEN and PELVIS FINDINGS Hepatobiliary: New hepatomegaly. Innumerable hypoenhancing  masses replacing much of the liver, significantly increased in size and number, for example a 2.0 cm superior liver mass (series 4/ image 15), increased from 0.7 cm. Normal gallbladder with no radiopaque cholelithiasis. No biliary ductal dilatation. Pancreas: Normal, with no mass or duct dilation. Spleen: Normal size. No mass. Adrenals/Urinary Tract: Newly apparent 1.4 cm left adrenal nodule with density 64 HU. No right adrenal nodule. No hydronephrosis. Subcentimeter hypodense right renal cortical lesion is too small to characterize and is stable. No new renal lesions. Normal bladder. Stomach/Bowel: Normal non-distended stomach. Normal appendix. Mild sigmoid diverticulosis. There is a gas containing 6.0 x 4.9 x 7.2 cm multilocular abscess in the ventral left lower abdomen (series 4/image 66) with thick enhancing wall, which appears to demonstrate a fistulous connection to the proximal sigmoid colon (series 15/image 70), and which is intimately associated with multiple small bowel loops. No small bowel dilatation. No focal small bowel caliber transition. No definite  small bowel wall thickening. Vascular/Lymphatic: Atherosclerotic nonaneurysmal abdominal aorta. Patent portal, splenic, hepatic and renal veins. No pathologically enlarged lymph nodes in the abdomen or pelvis. Reproductive: Status post hysterectomy, with no abnormal findings at the vaginal cuff. No adnexal mass. Other: No ascites. Musculoskeletal: Previously visualized faintly sclerotic lesions throughout the lumbar vertebral bodies are not discretely visualized on today's scan. Mild lumbar spondylosis. Review of the MIP images confirms the above findings. IMPRESSION: 1. No evidence of pulmonary embolism. 2. Complex gas-containing multilocular abscess in the anterior left lower abdomen with fistulous connection to the proximal sigmoid colon (suggesting the sequela of perforated diverticulitis), intimately associated with multiple small bowel loops. No evidence of bowel obstruction. 3. Significant progression of metastatic disease. Right supraclavicular, bilateral mediastinal and left hilar adenopathy is significantly increased. 4. New extrinsic narrowing of segmental left upper and left lower lobe airways by the infiltrative left hilar adenopathy. New patchy consolidation and nodularity in the posterior left upper lobe and anterior left lower lobe, favor a combination of postobstructive pneumonia and tumor. New scattered probable small pulmonary metastases in the right lung. 5. Liver metastases are significantly increased in size and number with new hepatomegaly. 6. New left adrenal metastasis. 7. New small dependent left pleural effusion. 8. Aortic Atherosclerosis (ICD10-I70.0) and Emphysema (ICD10-J43.9). Electronically Signed   By: Ilona Sorrel M.D.   On: 04/24/2017 18:01   Ct Pelvis Wo Contrast  Result Date: 05/17/2017 CLINICAL DATA:  62 year old female with a history of diverticulitis and abscess formation, with CT-guided drainage placement 04/25/2017 of left lower quadrant abscess. The patient in addition,  has known metastatic small cell carcinoma EXAM: CT PELVIS WITHOUT CONTRAST TECHNIQUE: Multidetector CT imaging of the pelvis was performed following the standard protocol without intravenous contrast. COMPARISON:  04/29/2017, 04/25/2017, 04/24/2017 FINDINGS: Urinary Tract: Unremarkable visualized lower right kidney and ureters. Urinary bladder unremarkable. Bowel: No inflammatory changes adjacent to the visualized descending colon and sigmoid colon. No residual focal fluid collection. Pigtail drainage catheter remains in position within the left lower quadrant, adjacent to multiple small bowel loops. No inflammatory changes or residual fluid. There is redemonstration of a small tail of soft tissue extending from the anti mesenteric aspect of the colon to the drain, likely representing a residual fistula connection. Vascular/Lymphatic: Calcifications of the vasculature. No adenopathy. Reproductive:  Hysterectomy Other: Imaged portions of the liver again demonstrate evidence of metastases Musculoskeletal: No acute displaced fracture. Questionable lucent lesion within the left iliac bone, at the posteriorly act crest, possibly metastasis. No evidence of associated fracture. IMPRESSION: Pigtail drainage catheter within  the left lower quadrant well positioned, with no evidence of residual abscess. There remains a tail of soft tissue extending from the pigtail drain to the border of the sigmoid colon, potentially representative of residual fistula. No evidence of residual inflammatory changes adjacent to the colon. Redemonstration of liver metastases, incompletely imaged. Electronically Signed   By: Corrie Mckusick D.O.   On: 05/17/2017 12:36   Ct Abdomen Pelvis W Contrast  Result Date: 04/30/2017 CLINICAL DATA:  62 year old female with a history of extensive stage small cell lung carcinoma including brain metastases and a recent diverticular abscess treated with percutaneous drain placement on 04/25/2017. New onset  right lower quadrant pain. EXAM: CT ABDOMEN AND PELVIS WITH CONTRAST TECHNIQUE: Multidetector CT imaging of the abdomen and pelvis was performed using the standard protocol following bolus administration of intravenous contrast. CONTRAST:  118mL ISOVUE-300 IOPAMIDOL (ISOVUE-300) INJECTION 61% COMPARISON:  Prior CT scan of the abdomen and pelvis 04/24/2017; CT drain placement 04/25/2017 FINDINGS: Lower chest: Enlarged and now moderate simple layering left pleural effusion with associated left lower lobe atelectasis. New small right-sided pleural effusion also with associated subsegmental atelectasis. The heart remains normal in size. Incompletely imaged port catheter tip in the upper right atrium. No pericardial effusion. Unremarkable distal thoracic esophagus. Hepatobiliary: Innumerable hypoechoic lesions scattered throughout the liver consistent with extensive metastatic disease. No significant interval change compared to recent prior imaging. Gallbladder is unremarkable. No intra or extrahepatic biliary ductal dilatation. Pancreas: Unremarkable. No pancreatic ductal dilatation or surrounding inflammatory changes. Spleen: Normal. Adrenals/Urinary Tract: Stable left adrenal nodule. Unremarkable right adrenal gland. The kidneys are normal in appearance. No hydronephrosis or nephrolithiasis. Unremarkable ureters and bladder. Stomach/Bowel: Moderate size rectal stool ball measuring 5 x 4.6 cm. There is a mild thickening of the adjacent rectal wall. Scattered sigmoid diverticula. Decreased inflammation. Small soft tissue tracks continues to extend from the region of the sigmoid colon to the prior abscess cavity. The abscess cavity is completely decompressed. The drainage catheter remains in good position. Normal appendix in a retrocecal position. No evidence of bowel obstruction. Vascular/Lymphatic: Atherosclerotic plaque in the abdominal aorta. No aneurysm or dissection. No significant lymphadenopathy. Reproductive:  Status post hysterectomy. No adnexal masses. Other: Small volume perihepatic an intra mesenteric ascites. No loculation or gas to suggest active infection. No abdominal wall hernia. Musculoskeletal: No acute or significant osseous findings. IMPRESSION: 1. Well-positioned drainage catheter with complete resolution of the previously noted diverticular abscess. 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 Recommended drain injection prior to removal. 2. No new abscess or acute intraabdominal abnormality. 3. Moderate volume of formed stool including a 5 cm rectal stool ball suggests constipation. 4. Slightly increased but still minimal abdominal ascites which is likely malignant or related to the diffuse hepatic metastatic disease. 5. New small right sided pleural effusion and enlarged moderate left-sided pleural effusion. Electronically Signed   By: Jacqulynn Cadet M.D.   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 History of palliative radiotherapy of an enlarging left upper lobe pulmonary nodule. Ongoing medical therapy. Patient presents with generalized abdominal pain and clinical concern for pulmonary embolism. EXAM: CT ANGIOGRAPHY CHEST CT ABDOMEN AND PELVIS WITH CONTRAST TECHNIQUE: Multidetector CT imaging of the chest was performed using the standard protocol during bolus administration of  intravenous contrast. Multiplanar CT image reconstructions and MIPs were obtained to evaluate the vascular anatomy. Multidetector CT imaging of the abdomen and pelvis was performed using the standard protocol during bolus administration of intravenous contrast. CONTRAST:  < 100 cc > ISOVUE-370 IOPAMIDOL (ISOVUE-370) INJECTION 76% COMPARISON:  03/09/2017 CT chest and  abdomen. Chest radiograph from earlier today. 10/15/2016 CT abdomen/pelvis. FINDINGS: CTA CHEST FINDINGS Cardiovascular: The study is moderate quality for the evaluation of pulmonary embolism, with evaluation of the subsegmental vessels limited by motion. There are no filling defects in the central, lobar, segmental or subsegmental pulmonary artery branches to suggest acute pulmonary embolism. Mildly atherosclerotic nonaneurysmal thoracic aorta. Normal caliber pulmonary arteries. Normal heart size. No significant pericardial fluid/thickening. Right internal jugular MediPort terminates in the right atrium. Mediastinum/Nodes: No discrete thyroid nodules. Unremarkable esophagus. No axillary adenopathy. Newly enlarged 2.0 cm right supraclavicular node (series 7/image 8). Increased right paratracheal adenopathy measuring up to 2.1 cm (series 7/ image 24), previously 1.2 cm. Increased prevascular bilateral mediastinal adenopathy measuring up to 2.0 cm on the right (series 7/ image 26), previously 1.3 cm. Increased 1.9 cm enlarged subcarinal node (series 7/ image 42), previously 1.1 cm. Newly enlarged 1.8 cm AP window node (series 7/ image 35). Newly moderate infiltrative left hilar adenopathy measuring up to 2.0 cm (series 7/ image 48), with associated extrinsic narrowing of segmental lingular and left lower lobe airways. No right hilar adenopathy. Lungs/Pleura: No pneumothorax. No right pleural effusion. New small dependent left pleural effusion. Mild centrilobular emphysema with mild diffuse bronchial wall thickening. Stable bandlike consolidation in the anterior left upper lobe compatible with radiation fibrosis. Stable irregular solid 1.5 cm left upper lobe pulmonary nodule (series 13/image 40). New patchy consolidation, parenchymal banding and nodularity in the posterior left upper lobe and anterior left lower lobe. A few new scattered solid pulmonary nodules in the right lung, largest 6 mm in the anterior right  middle lobe (series 13/ image 76). Musculoskeletal: No aggressive appearing focal osseous lesions. Moderate thoracic spondylosis. Review of the MIP images confirms the above findings. CT ABDOMEN and PELVIS FINDINGS Hepatobiliary: New hepatomegaly. Innumerable hypoenhancing masses replacing much of the liver, significantly increased in size and number, for example a 2.0 cm superior liver mass (series 4/ image 15), increased from 0.7 cm. Normal gallbladder with no radiopaque cholelithiasis. No biliary ductal dilatation. Pancreas: Normal, with no mass or duct dilation. Spleen: Normal size. No mass. Adrenals/Urinary Tract: Newly apparent 1.4 cm left adrenal nodule with density 64 HU. No right adrenal nodule. No hydronephrosis. Subcentimeter hypodense right renal cortical lesion is too small to characterize and is stable. No new renal lesions. Normal bladder. Stomach/Bowel: Normal non-distended stomach. Normal appendix. Mild sigmoid diverticulosis. There is a gas containing 6.0 x 4.9 x 7.2 cm multilocular abscess in the ventral left lower abdomen (series 4/image 66) with thick enhancing wall, which appears to demonstrate a fistulous connection to the proximal sigmoid colon (series 15/image 70), and which is intimately associated with multiple small bowel loops. No small bowel dilatation. No focal small bowel caliber transition. No definite small bowel wall thickening. Vascular/Lymphatic: Atherosclerotic nonaneurysmal abdominal aorta. Patent portal, splenic, hepatic and renal veins. No pathologically enlarged lymph nodes in the abdomen or pelvis. Reproductive: Status post hysterectomy, with no abnormal findings at the vaginal cuff. No adnexal mass. Other: No ascites. Musculoskeletal: Previously visualized faintly sclerotic lesions throughout the lumbar vertebral bodies are not discretely visualized on today's scan. Mild lumbar spondylosis. Review of the MIP images confirms the above findings. IMPRESSION:  1. No evidence of  pulmonary embolism. 2. Complex gas-containing multilocular abscess in the anterior left lower abdomen with fistulous connection to the proximal sigmoid colon (suggesting the sequela of perforated diverticulitis), intimately associated with multiple small bowel loops. No evidence of bowel obstruction. 3. Significant progression of metastatic disease. Right supraclavicular, bilateral mediastinal and left hilar adenopathy is significantly increased. 4. New extrinsic narrowing of segmental left upper and left lower lobe airways by the infiltrative left hilar adenopathy. New patchy consolidation and nodularity in the posterior left upper lobe and anterior left lower lobe, favor a combination of postobstructive pneumonia and tumor. New scattered probable small pulmonary metastases in the right lung. 5. Liver metastases are significantly increased in size and number with new hepatomegaly. 6. New left adrenal metastasis. 7. New small dependent left pleural effusion. 8. Aortic Atherosclerosis (ICD10-I70.0) and Emphysema (ICD10-J43.9). Electronically Signed   By: Ilona Sorrel M.D.   On: 04/24/2017 18:01   Ir Sinus/fist Tube Chk-non Gi  Result Date: 05/02/2017 CLINICAL DATA:  History of metastatic small cell lung cancer with findings worrisome for advanced metastatic disease. Unfortunately, patient developed concomitant presumed diverticular abscess within the ventral aspect of the lower abdomen for which the patient underwent successful CT-guided percutaneous drainage catheter placement on 04/25/2017. Subsequent abdominal CT performed 04/29/2017 demonstrates complete resolution of the diverticular abscess. There has been minimal output from the percutaneous drainage catheter for the past several days and as such, the patient presents now for fluoroscopic guided drainage catheter injection prior to potential removal. EXAM: SINUS TRACT INJECTION/FISTULOGRAM COMPARISON:  CT abdomen pelvis- 04/29/2017; 04/24/2017; CT-guided  percutaneous drainage catheter placement - 04/25/2017 CONTRAST:  10 cc Isovue 300, administered via the existing percutaneous drainage catheter. FLUOROSCOPY TIME:  1 minute, 12 seconds (68 mGy) TECHNIQUE: The patient was positioned supine on the fluoroscopy table. A preprocedural spot fluoroscopic image was obtained of the lower pelvis and existing percutaneous drainage catheter Multiple spot fluoroscopic and radiographic images were obtained following the injection of a small amount of contrast via the existing percutaneous drainage catheter. Images reviewed in the procedure was terminated. The drainage catheter was flushed with a small amount of saline and reconnected to a gravity bag. As was placed. The patient tolerated the procedure well without immediate postprocedural complication. FINDINGS: Preprocedural spot fluoroscopic image demonstrates unchanged positioning of left lower abdominal percutaneous drainage catheter Contrast injection demonstrates opacification of decompressed abscess cavity, however there is persistent fistulous connection between the decompressed abscess cavity in the adjacent sigmoid colon. IMPRESSION: Fistulous connection between the decompressed abscess cavity and the adjacent sigmoid colon. As such, the drainage catheter was NOT removed at this time. PLAN: - Patient was instructed to no longer flush the percutaneous drainage catheter. - Patient was instructed to maintain diligent records regarding drainage catheter output. - Patient should undergo drainage catheter injection in approximately 2 weeks. This could be done in the hospital (if the patient is still an inpatient), otherwise the injection should be performed at the interventional radiology drain Clinic [phone]). Electronically Signed   By: Sandi Mariscal M.D.   On: 05/02/2017 11:35   Dg Sinus/fist Tube Chk-non Gi  Result Date: 05/17/2017 INDICATION: 62 year old female with a history of diverticulitis and abscess of the left  lower quadrant with percutaneous drainage 04/25/2017. The patient reports greater than 100 cc per day of an output with the out put being frankly feculent material. EXAM: DRAIN INJECTION MEDICATIONS: None ANESTHESIA/SEDATION: None. COMPLICATIONS: None PROCEDURE: Informed written consent was obtained from the patient after a thorough discussion  of the procedural risks, benefits and alternatives. All questions were addressed. Maximal Sterile Barrier Technique was utilized including caps, mask, sterile gowns, sterile gloves, sterile drape, hand hygiene and skin antiseptic. A timeout was performed prior to the initiation of the procedure. Patient positioned supine position on the fluoroscopy table. The indwelling drain of the left lower quadrant was injected gently under fluoroscopy with images stored and sent to PACs. After images were acquired, the drain was attached to gravity drainage. FINDINGS: Greater than 100 cc per day of frankly feculent output into the bag. No bloody output. Injection demonstrates intravasation. No residual abscess. Accumulation of contrast material in the interloop region. IMPRESSION: Drain injection demonstrates no residual abscess. Significant decreased size of fistulous connection to the colon compared to the drain injection performed 05/02/2017 Focal fluid collection adjacent to the catheter favored to represent accumulation of contrast fluid within the interloop region of small bowel, given the appearance on today's CT scan. Transient intravasation during the contrast injection. Electronically Signed   By: Corrie Mckusick D.O.   On: 05/17/2017 12:51   Ct Image Guided Drainage By Percutaneous Catheter  Result Date: 04/25/2017 INDICATION: History of metastatic small cell lung cancer with findings worrisome for advanced metastatic disease. Unfortunately, patient has developed a concomitant presumed diverticular abscess within the ventral aspect of the left lower abdomen. Given poor  operative candidacy, request made for CT-guided percutaneous drainage catheter placement for infection source control purposes. EXAM: CT IMAGE GUIDED DRAINAGE BY PERCUTANEOUS CATHETER COMPARISON:  CT abdomen and pelvis - 04/24/2017 MEDICATIONS: The patient is currently admitted to the hospital and receiving intravenous antibiotics. The antibiotics were administered within an appropriate time frame prior to the initiation of the procedure. ANESTHESIA/SEDATION: Moderate (conscious) sedation was employed during this procedure. A total of Versed 2 mg and Fentanyl 100 mcg was administered intravenously. Moderate Sedation Time: 13 minutes. The patient's level of consciousness and vital signs were monitored continuously by radiology nursing throughout the procedure under my direct supervision. CONTRAST:  None COMPLICATIONS: None immediate. PROCEDURE: Informed written consent was obtained from the patient after a discussion of the risks, benefits and alternatives to treatment. The patient was placed supine on the CT gantry and a pre procedural CT was performed re-demonstrating the known abscess/fluid collection within the ventral aspect of the left lower abdomen/ pelvis with dominant air in fluid containing component measuring approximately 6.8 x 4.7 cm (image 18, series 2). The procedure was planned. A timeout was performed prior to the initiation of the procedure. The skin overlying the ventral aspect of the left lower abdomen was prepped and draped in the usual sterile fashion. The overlying soft tissues were anesthetized with 1% lidocaine with epinephrine. Appropriate trajectory was planned with the use of a 22 gauge spinal needle. An 18 gauge trocar needle was advanced into the abscess/fluid collection and a short Amplatz super stiff wire was coiled within the collection. Appropriate positioning was confirmed with a limited CT scan. The tract was serially dilated allowing placement of a 10 [place] all-purpose drainage  catheter. Appropriate positioning was confirmed with a limited postprocedural CT scan. Approximately 80 Ml of purulent fluid was aspirated. The tube was connected to a drainage bag and sutured in place. A dressing was placed. The patient tolerated the procedure well without immediate post procedural complication. IMPRESSION: Successful CT guided placement of a 10 [place] all purpose drain catheter into the presumed diverticular abscess within the ventral aspect the left lower abdomen with aspiration of 80 mL of purulent fluid. Samples  were sent to the laboratory as requested by the ordering clinical team. Electronically Signed   By: Sandi Mariscal M.D.   On: 04/25/2017 14:55   Ir Radiologist Eval & Mgmt  Result Date: 05/17/2017 Please refer to notes tab for details about interventional procedure. (Op Note)   Labs:  CBC: Recent Labs    04/29/17 0451 05/01/17 0902 05/03/17 0438 05/10/17 1205  WBC 9.3 7.6 7.1 8.0  HGB 7.8* 7.8* 8.2* 8.1*  HCT 22.9* 23.1* 24.2* 24.5*  PLT 120* 122* 129* 114*    COAGS: Recent Labs    09/27/16 1611 02/01/17 1254 04/25/17 1027  INR 0.99 0.78 1.15  APTT 27  --   --     BMP: Recent Labs    04/27/17 0443 04/28/17 0411 04/29/17 0451 05/01/17 0902 05/10/17 1205  NA 134*  --  134* 134* 137  K 3.8  --  3.7 3.8 3.8  CL 102  --  101 101 97*  CO2 25  --  27 26 29   GLUCOSE 72  --  82 79 88  BUN 11  --  9 7 4*  CALCIUM 9.4  --  9.6 9.3 10.1  CREATININE 0.89 0.88 0.88 0.82 0.87  GFRNONAA >60 >60 >60 >60 >60  GFRAA >60 >60 >60 >60 >60    LIVER FUNCTION TESTS: Recent Labs    04/26/17 0449 04/27/17 0443 04/29/17 0451 05/10/17 1205  BILITOT 3.4* 2.8* 2.7* 1.5*  AST 258* 234* 205* 134*  ALT 32 31 30 28   ALKPHOS 446* 445* 492* 636*  PROT 5.6* 5.4* 5.7* 6.0*  ALBUMIN 2.0* 2.0* 2.1* 2.1*    TUMOR MARKERS: No results for input(s): AFPTM, CEA, CA199, CHROMGRNA in the last 8760 hours.  Assessment and Plan: Diverticular abscess with LLQ  drain +fistula given continued feculent output an injection findings. Recommend cont gravity drain. Follow up in 2-3 weeks with repeat injection. Fistula in unlikely to close/heal given comorbid immunocompromised and malnourished state.  Thank you for this interesting consult.  I greatly enjoyed meeting Deerica G Muela and look forward to participating in their care.  A copy of this report was sent to the requesting provider on this date.  Electronically Signed: Ascencion Dike 05/17/2017, 2:00 PM   I spent a total of 20 in face to face in clinical consultation, greater than 50% of which was counseling/coordinating care for drain care

## 2017-05-23 ENCOUNTER — Telehealth: Payer: Self-pay | Admitting: Medical Oncology

## 2017-05-26 ENCOUNTER — Inpatient Hospital Stay: Admission: RE | Admit: 2017-05-26 | Payer: 59 | Source: Ambulatory Visit

## 2017-05-27 NOTE — Telephone Encounter (Signed)
Pt died 06/05/2017 at 0540.

## 2017-05-27 DEATH — deceased

## 2017-05-31 ENCOUNTER — Ambulatory Visit: Payer: Self-pay | Admitting: Radiation Oncology

## 2017-06-07 ENCOUNTER — Other Ambulatory Visit: Payer: 59

## 2017-07-07 ENCOUNTER — Other Ambulatory Visit: Payer: Self-pay | Admitting: Nurse Practitioner

## 2017-09-04 IMAGING — DX DG ABDOMEN ACUTE W/ 1V CHEST
3 series · 3 of 3 positions shown · non-contrast
Comparison: CT scans dated 10/15/2016 and 10/11/2016 and chest
x-ray dated 11/10/2015

CLINICAL DATA: Small bowel obstruction.

EXAM:
DG ABDOMEN ACUTE W/ 1V CHEST

[chest pa]
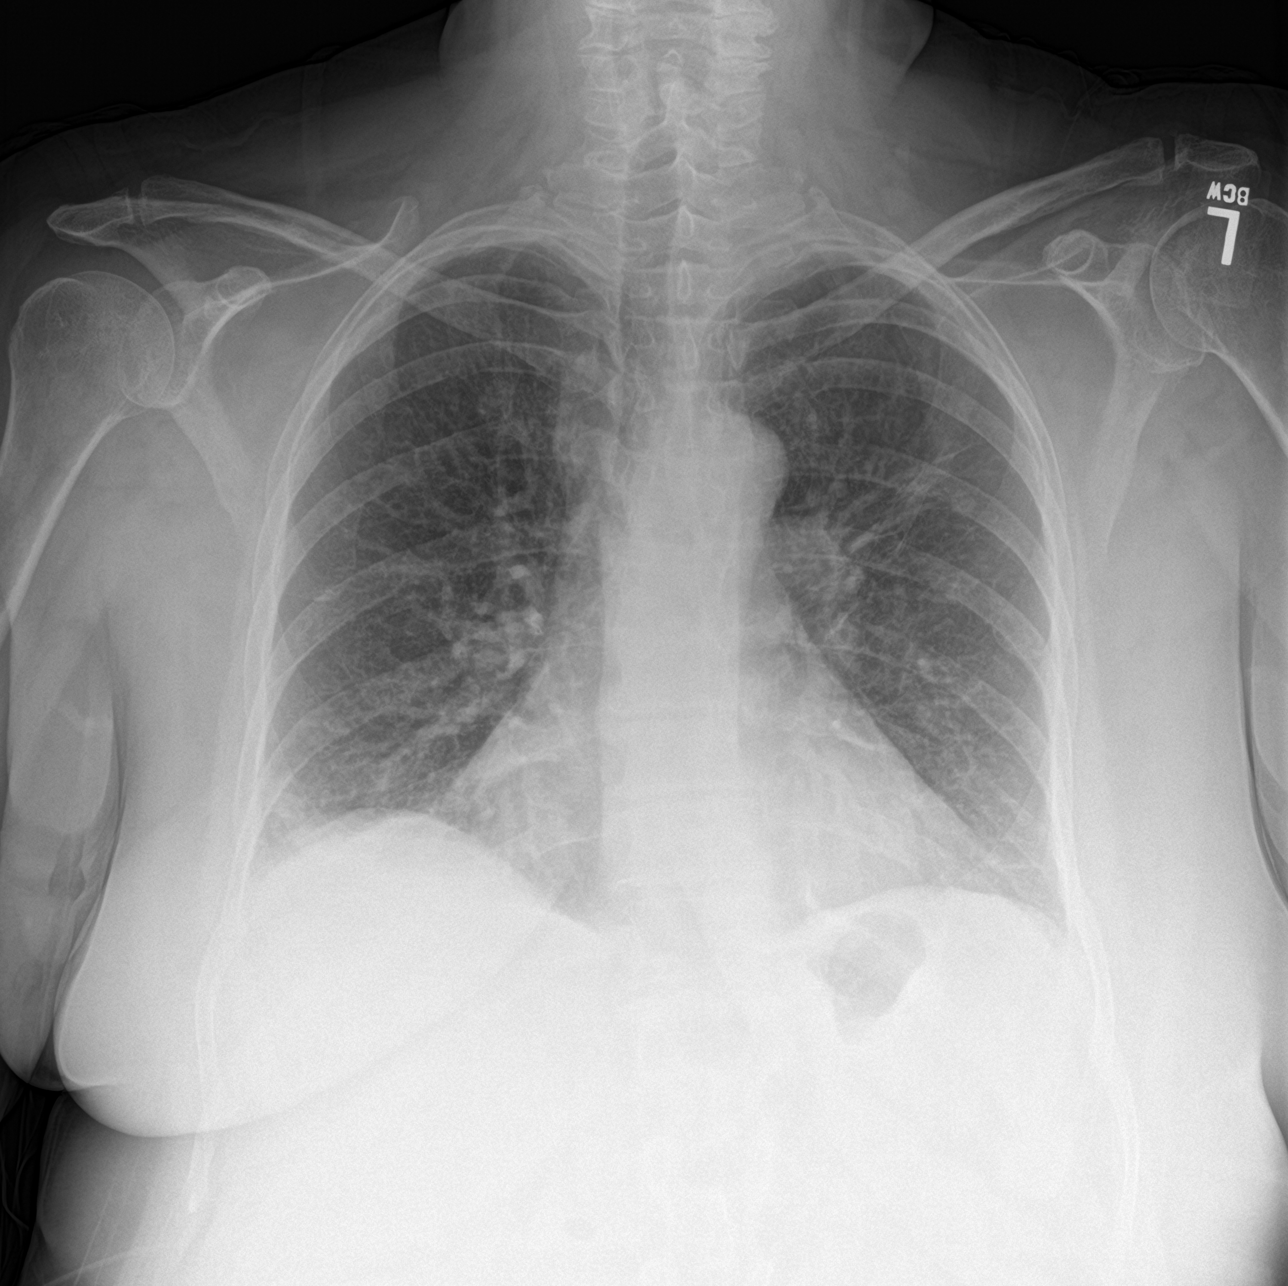

[abdomen erect]
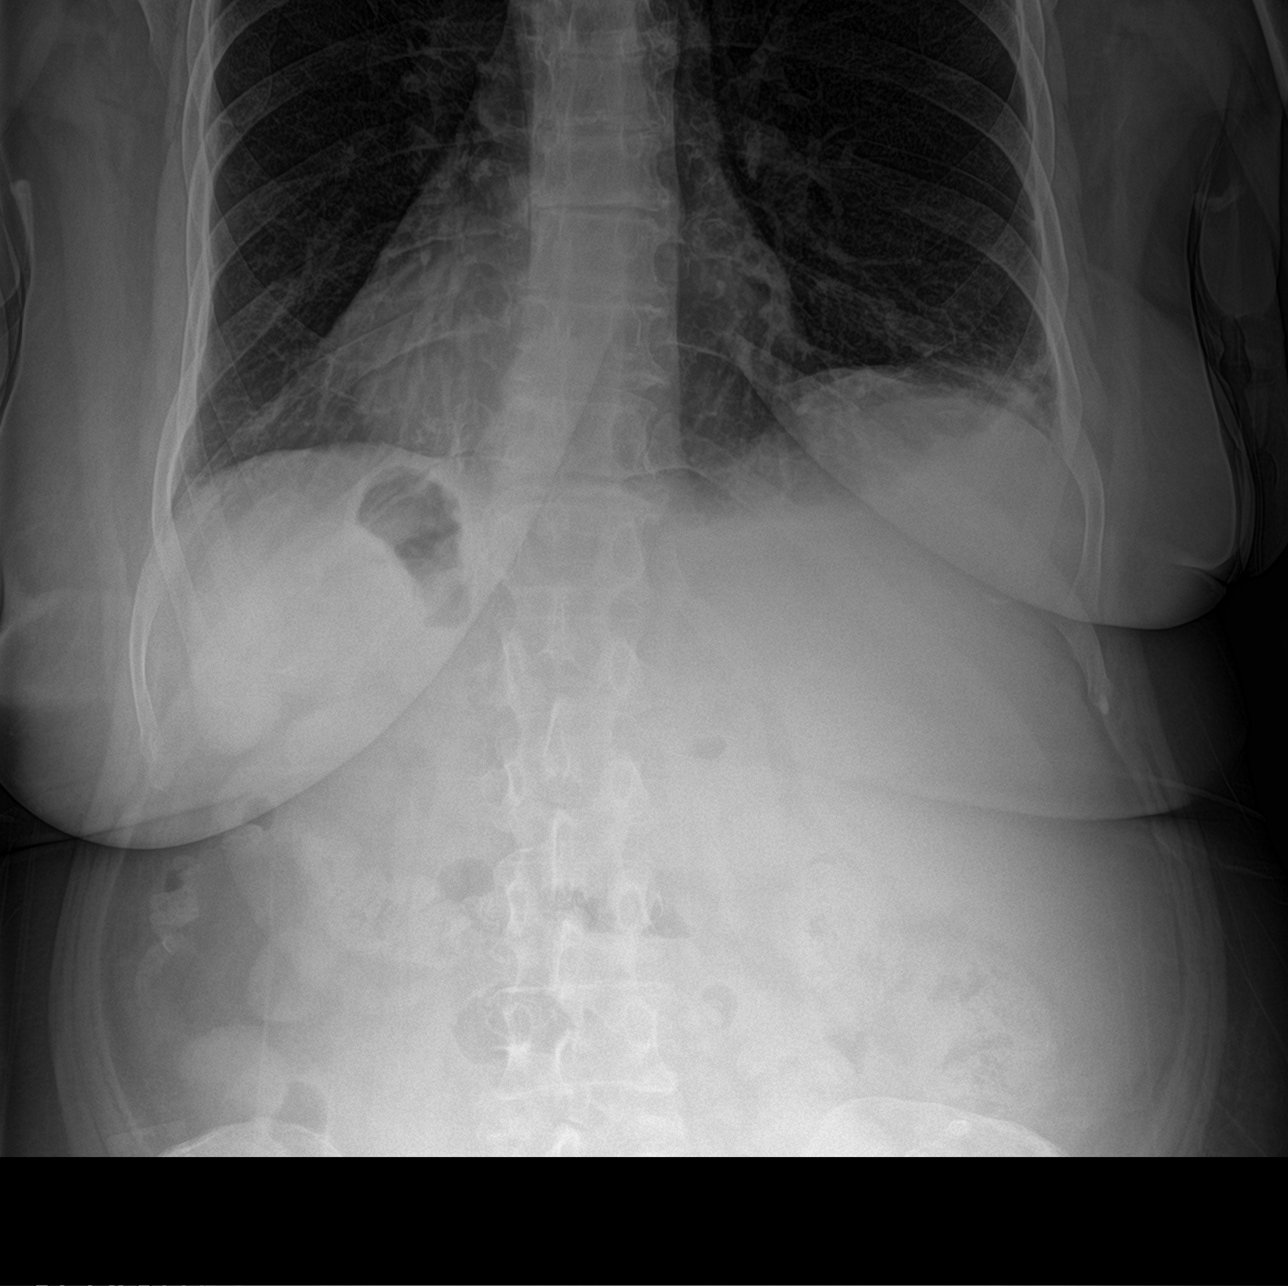

[abdomen supine]
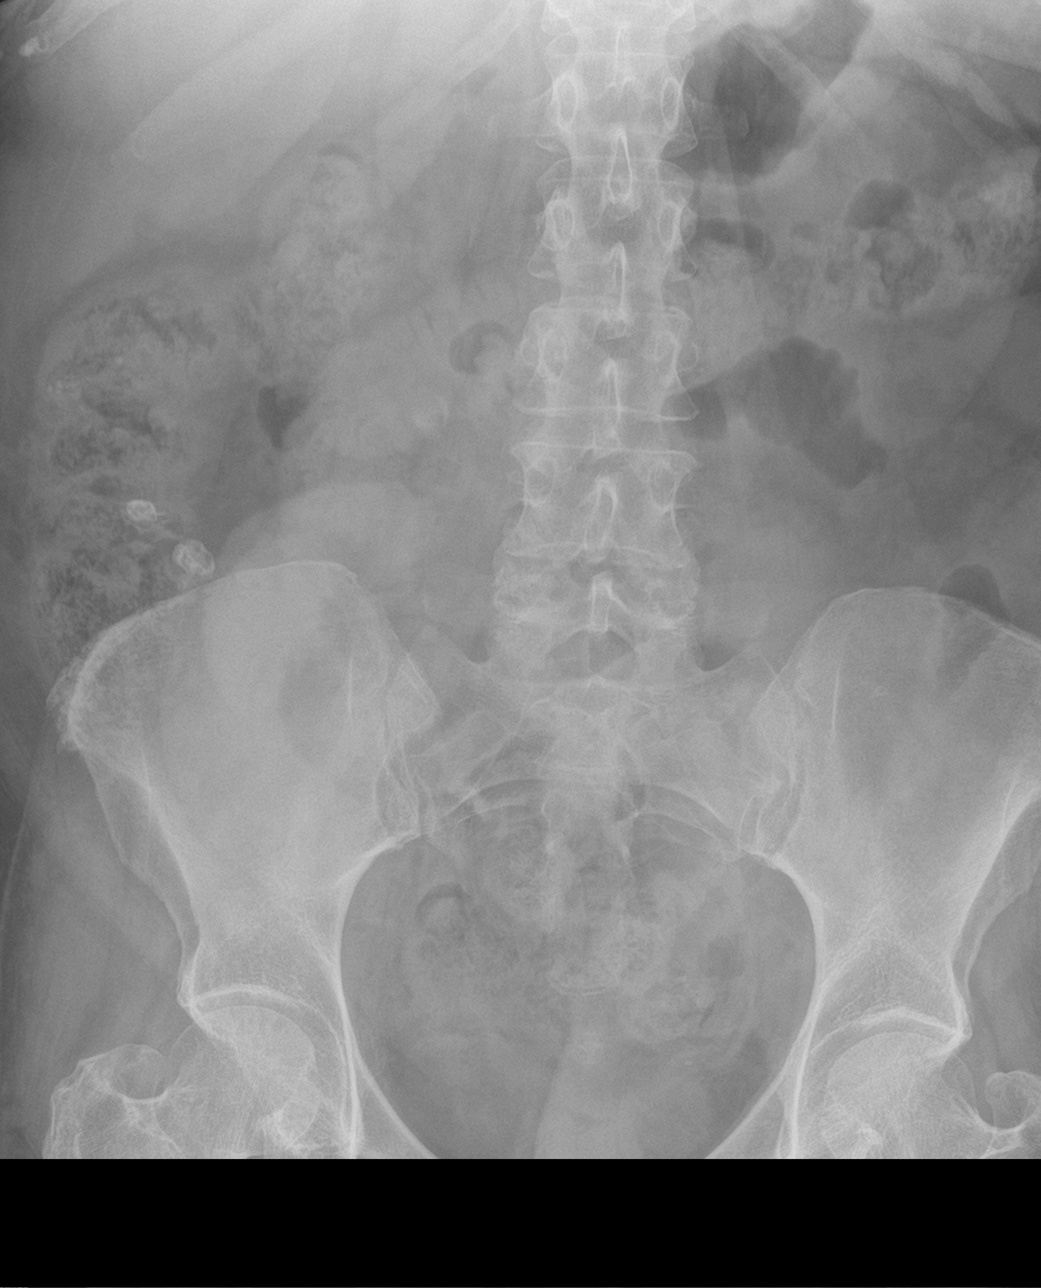

[3 of 3 positions shown; findings below may reference images not displayed]

FINDINGS: There is slight dilatation of small bowel loops in the right mid
abdomen. Contrast from the prior CT scan has passed into the
nondistended colon.

No free air. New slight atelectasis and small effusion on the right.
Scarring in the left upper lobe as described on the recent CT scan
of the chest.
IMPRESSION: 1. Slightly dilated small bowel loops in the right mid abdomen. This
could represent a partial small bowel obstruction. However, contrast
has passed into the nondistended colon.
2. Slight atelectasis and effusion at the right base.
3. Scarring in the left upper lobe as previously described on CT
scan of 10/11/2016.

## 2017-11-28 IMAGING — CR DG CHEST 2V
2 series · 2 of 2 positions shown · non-contrast
Comparison: 10/16/2016

CLINICAL DATA: Cough and congestion for 6 days.

EXAM:
CHEST  2 VIEW

[w chest pa]
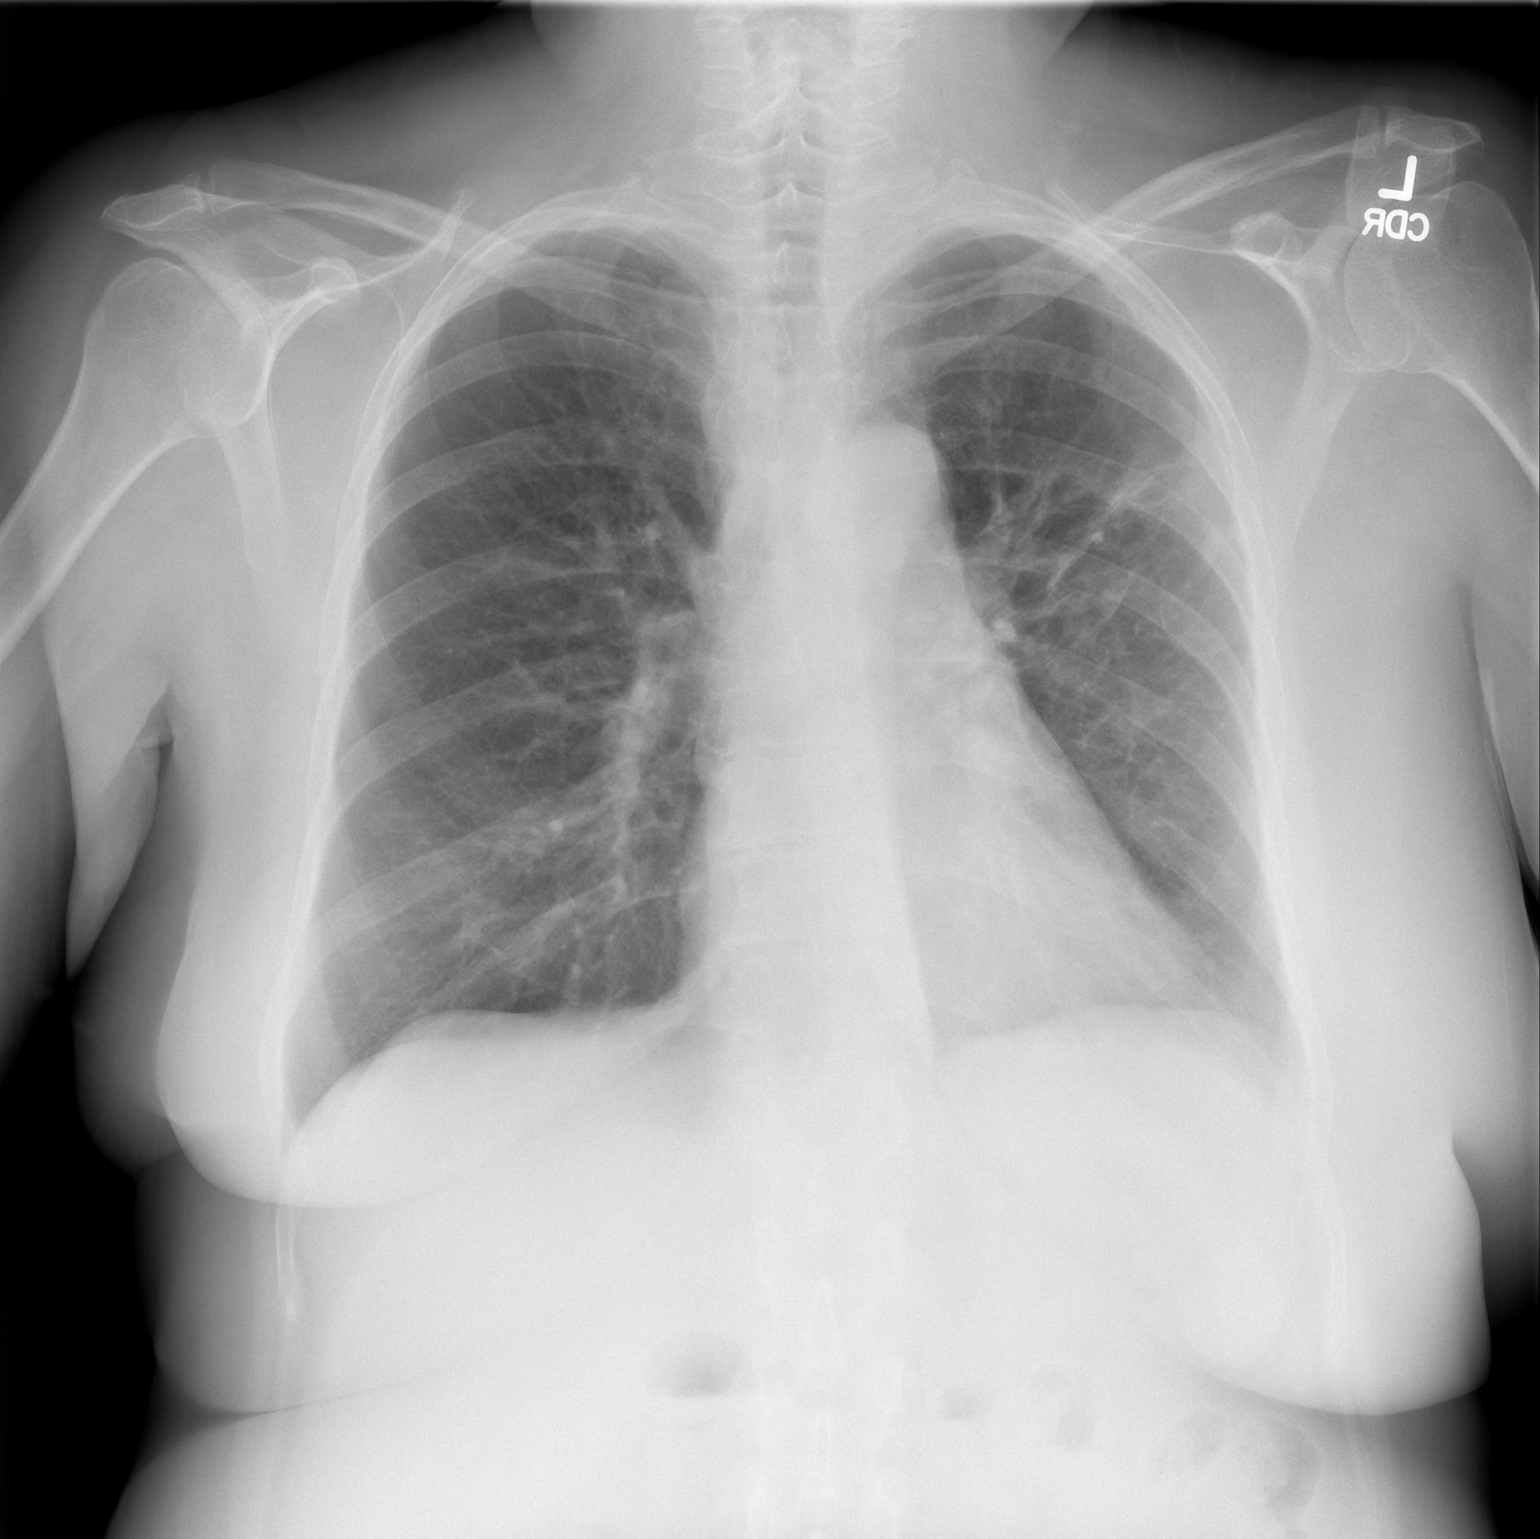

[w chest lat]
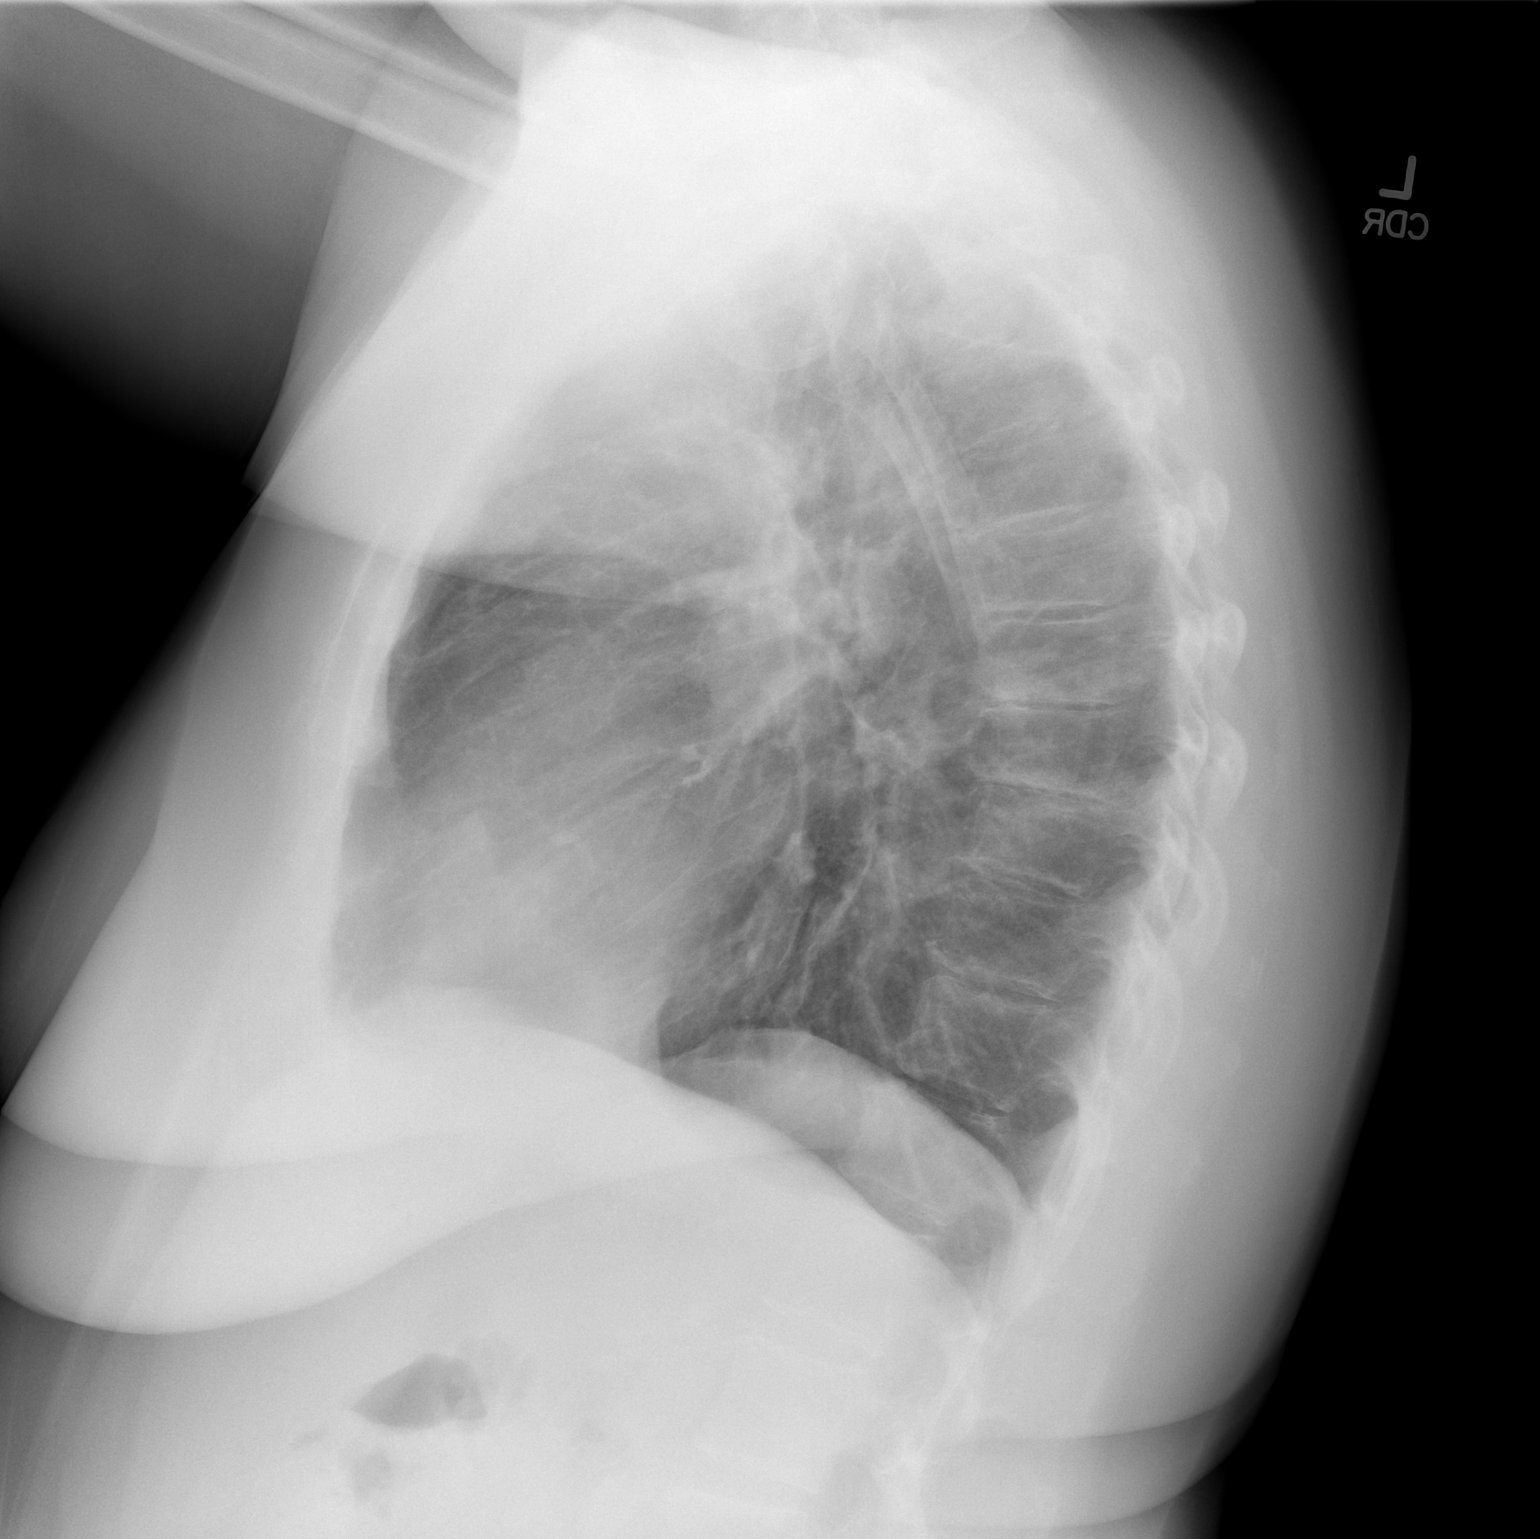

[2 of 2 positions shown; findings below may reference images not displayed]

FINDINGS: The cardiomediastinal silhouette is unremarkable.

Left upper lung scarring again noted.

There is no evidence of focal airspace disease, pulmonary edema,
suspicious pulmonary nodule/mass, pleural effusion, or pneumothorax.
No acute bony abnormalities are identified.
IMPRESSION: No active cardiopulmonary disease.
